# Patient Record
Sex: Female | Born: 1955 | Race: Black or African American | Hispanic: No | Marital: Single | Smoking: Never smoker
Health system: Southern US, Community
[De-identification: ages and names within clinical notes are randomized; demographics above are authoritative.]

## PROBLEM LIST (undated history)

## (undated) DIAGNOSIS — I10 Essential (primary) hypertension: Secondary | ICD-10-CM

## (undated) DIAGNOSIS — H269 Unspecified cataract: Secondary | ICD-10-CM

## (undated) DIAGNOSIS — E079 Disorder of thyroid, unspecified: Secondary | ICD-10-CM

## (undated) DIAGNOSIS — D352 Benign neoplasm of pituitary gland: Secondary | ICD-10-CM

## (undated) DIAGNOSIS — G4730 Sleep apnea, unspecified: Secondary | ICD-10-CM

## (undated) DIAGNOSIS — E119 Type 2 diabetes mellitus without complications: Secondary | ICD-10-CM

## (undated) DIAGNOSIS — E785 Hyperlipidemia, unspecified: Secondary | ICD-10-CM

## (undated) DIAGNOSIS — I509 Heart failure, unspecified: Secondary | ICD-10-CM

## (undated) HISTORY — DX: Disorder of thyroid, unspecified: E07.9

## (undated) HISTORY — DX: Sleep apnea, unspecified: G47.30

## (undated) HISTORY — PX: TONSILLECTOMY: SUR1361

## (undated) HISTORY — DX: Benign neoplasm of pituitary gland: D35.2

## (undated) HISTORY — PX: BREAST SURGERY: SHX581

## (undated) HISTORY — DX: Type 2 diabetes mellitus without complications: E11.9

## (undated) HISTORY — DX: Unspecified cataract: H26.9

## (undated) HISTORY — PX: BREAST EXCISIONAL BIOPSY: SUR124

## (undated) HISTORY — DX: Heart failure, unspecified: I50.9

## (undated) HISTORY — DX: Essential (primary) hypertension: I10

## (undated) HISTORY — PX: TUBAL LIGATION: SHX77

## (undated) HISTORY — DX: Hyperlipidemia, unspecified: E78.5

## (undated) HISTORY — PX: CHOLECYSTECTOMY: SHX55

---

## 2016-03-27 LAB — LAB REPORT - SCANNED: A1c: 11.4

## 2016-10-30 DIAGNOSIS — E669 Obesity, unspecified: Secondary | ICD-10-CM | POA: Insufficient documentation

## 2016-12-05 DIAGNOSIS — E113412 Type 2 diabetes mellitus with severe nonproliferative diabetic retinopathy with macular edema, left eye: Secondary | ICD-10-CM | POA: Diagnosis not present

## 2016-12-05 DIAGNOSIS — E113411 Type 2 diabetes mellitus with severe nonproliferative diabetic retinopathy with macular edema, right eye: Secondary | ICD-10-CM | POA: Diagnosis not present

## 2016-12-06 DIAGNOSIS — E1142 Type 2 diabetes mellitus with diabetic polyneuropathy: Secondary | ICD-10-CM | POA: Diagnosis not present

## 2016-12-08 DIAGNOSIS — E119 Type 2 diabetes mellitus without complications: Secondary | ICD-10-CM | POA: Diagnosis not present

## 2016-12-13 DIAGNOSIS — E039 Hypothyroidism, unspecified: Secondary | ICD-10-CM | POA: Diagnosis not present

## 2016-12-13 DIAGNOSIS — E669 Obesity, unspecified: Secondary | ICD-10-CM | POA: Diagnosis not present

## 2016-12-13 DIAGNOSIS — E139 Other specified diabetes mellitus without complications: Secondary | ICD-10-CM | POA: Diagnosis not present

## 2016-12-29 DIAGNOSIS — E119 Type 2 diabetes mellitus without complications: Secondary | ICD-10-CM | POA: Diagnosis not present

## 2016-12-29 DIAGNOSIS — I10 Essential (primary) hypertension: Secondary | ICD-10-CM | POA: Diagnosis not present

## 2016-12-29 DIAGNOSIS — H6120 Impacted cerumen, unspecified ear: Secondary | ICD-10-CM | POA: Diagnosis not present

## 2016-12-29 DIAGNOSIS — E039 Hypothyroidism, unspecified: Secondary | ICD-10-CM | POA: Diagnosis not present

## 2016-12-29 DIAGNOSIS — E785 Hyperlipidemia, unspecified: Secondary | ICD-10-CM | POA: Diagnosis not present

## 2016-12-29 LAB — LAB REPORT - SCANNED: A1c: 9.6

## 2017-01-10 DIAGNOSIS — E669 Obesity, unspecified: Secondary | ICD-10-CM | POA: Diagnosis not present

## 2017-01-10 DIAGNOSIS — I10 Essential (primary) hypertension: Secondary | ICD-10-CM | POA: Diagnosis not present

## 2017-01-10 DIAGNOSIS — E139 Other specified diabetes mellitus without complications: Secondary | ICD-10-CM | POA: Diagnosis not present

## 2017-01-10 DIAGNOSIS — E039 Hypothyroidism, unspecified: Secondary | ICD-10-CM | POA: Diagnosis not present

## 2017-01-12 DIAGNOSIS — E669 Obesity, unspecified: Secondary | ICD-10-CM | POA: Diagnosis not present

## 2017-01-12 DIAGNOSIS — E119 Type 2 diabetes mellitus without complications: Secondary | ICD-10-CM | POA: Diagnosis not present

## 2017-01-12 DIAGNOSIS — E039 Hypothyroidism, unspecified: Secondary | ICD-10-CM | POA: Diagnosis not present

## 2017-01-12 DIAGNOSIS — I10 Essential (primary) hypertension: Secondary | ICD-10-CM | POA: Diagnosis not present

## 2017-01-15 DIAGNOSIS — E113412 Type 2 diabetes mellitus with severe nonproliferative diabetic retinopathy with macular edema, left eye: Secondary | ICD-10-CM | POA: Diagnosis not present

## 2017-01-15 DIAGNOSIS — E113411 Type 2 diabetes mellitus with severe nonproliferative diabetic retinopathy with macular edema, right eye: Secondary | ICD-10-CM | POA: Diagnosis not present

## 2017-02-02 DIAGNOSIS — E039 Hypothyroidism, unspecified: Secondary | ICD-10-CM | POA: Diagnosis not present

## 2017-02-02 DIAGNOSIS — E119 Type 2 diabetes mellitus without complications: Secondary | ICD-10-CM | POA: Diagnosis not present

## 2017-02-02 DIAGNOSIS — I10 Essential (primary) hypertension: Secondary | ICD-10-CM | POA: Diagnosis not present

## 2017-02-02 DIAGNOSIS — E669 Obesity, unspecified: Secondary | ICD-10-CM | POA: Diagnosis not present

## 2017-02-10 DIAGNOSIS — E039 Hypothyroidism, unspecified: Secondary | ICD-10-CM | POA: Diagnosis not present

## 2017-02-10 DIAGNOSIS — E139 Other specified diabetes mellitus without complications: Secondary | ICD-10-CM | POA: Diagnosis not present

## 2017-02-26 DIAGNOSIS — E113412 Type 2 diabetes mellitus with severe nonproliferative diabetic retinopathy with macular edema, left eye: Secondary | ICD-10-CM | POA: Diagnosis not present

## 2017-02-26 DIAGNOSIS — H353221 Exudative age-related macular degeneration, left eye, with active choroidal neovascularization: Secondary | ICD-10-CM | POA: Diagnosis not present

## 2017-02-26 DIAGNOSIS — E113411 Type 2 diabetes mellitus with severe nonproliferative diabetic retinopathy with macular edema, right eye: Secondary | ICD-10-CM | POA: Diagnosis not present

## 2017-03-05 ENCOUNTER — Other Ambulatory Visit: Payer: Self-pay | Admitting: Primary Care

## 2017-03-05 DIAGNOSIS — Z1231 Encounter for screening mammogram for malignant neoplasm of breast: Secondary | ICD-10-CM

## 2017-03-09 DIAGNOSIS — E119 Type 2 diabetes mellitus without complications: Secondary | ICD-10-CM | POA: Diagnosis not present

## 2017-03-12 DIAGNOSIS — E039 Hypothyroidism, unspecified: Secondary | ICD-10-CM | POA: Diagnosis not present

## 2017-03-12 DIAGNOSIS — E118 Type 2 diabetes mellitus with unspecified complications: Secondary | ICD-10-CM | POA: Diagnosis not present

## 2017-03-17 DIAGNOSIS — Z6831 Body mass index (BMI) 31.0-31.9, adult: Secondary | ICD-10-CM | POA: Diagnosis not present

## 2017-03-17 DIAGNOSIS — H9111 Presbycusis, right ear: Secondary | ICD-10-CM | POA: Diagnosis not present

## 2017-03-17 DIAGNOSIS — Z7982 Long term (current) use of aspirin: Secondary | ICD-10-CM | POA: Diagnosis not present

## 2017-03-17 DIAGNOSIS — R42 Dizziness and giddiness: Secondary | ICD-10-CM | POA: Diagnosis not present

## 2017-03-17 DIAGNOSIS — K047 Periapical abscess without sinus: Secondary | ICD-10-CM | POA: Diagnosis not present

## 2017-03-17 DIAGNOSIS — E1142 Type 2 diabetes mellitus with diabetic polyneuropathy: Secondary | ICD-10-CM | POA: Diagnosis not present

## 2017-03-17 DIAGNOSIS — E785 Hyperlipidemia, unspecified: Secondary | ICD-10-CM | POA: Diagnosis not present

## 2017-03-17 DIAGNOSIS — G3184 Mild cognitive impairment, so stated: Secondary | ICD-10-CM | POA: Diagnosis not present

## 2017-03-17 DIAGNOSIS — Z79899 Other long term (current) drug therapy: Secondary | ICD-10-CM | POA: Diagnosis not present

## 2017-03-17 DIAGNOSIS — H9313 Tinnitus, bilateral: Secondary | ICD-10-CM | POA: Diagnosis not present

## 2017-03-17 DIAGNOSIS — Z794 Long term (current) use of insulin: Secondary | ICD-10-CM | POA: Diagnosis not present

## 2017-03-17 DIAGNOSIS — K08409 Partial loss of teeth, unspecified cause, unspecified class: Secondary | ICD-10-CM | POA: Diagnosis not present

## 2017-03-17 DIAGNOSIS — E039 Hypothyroidism, unspecified: Secondary | ICD-10-CM | POA: Diagnosis not present

## 2017-03-17 DIAGNOSIS — R3915 Urgency of urination: Secondary | ICD-10-CM | POA: Diagnosis not present

## 2017-03-17 DIAGNOSIS — Z0000 Encounter for general adult medical examination without abnormal findings: Secondary | ICD-10-CM | POA: Diagnosis not present

## 2017-03-17 DIAGNOSIS — I10 Essential (primary) hypertension: Secondary | ICD-10-CM | POA: Diagnosis not present

## 2017-03-27 ENCOUNTER — Ambulatory Visit: Payer: Self-pay

## 2017-04-10 DIAGNOSIS — H3562 Retinal hemorrhage, left eye: Secondary | ICD-10-CM | POA: Diagnosis not present

## 2017-04-10 DIAGNOSIS — E113511 Type 2 diabetes mellitus with proliferative diabetic retinopathy with macular edema, right eye: Secondary | ICD-10-CM | POA: Diagnosis not present

## 2017-04-10 DIAGNOSIS — H4311 Vitreous hemorrhage, right eye: Secondary | ICD-10-CM | POA: Diagnosis not present

## 2017-04-10 DIAGNOSIS — H353221 Exudative age-related macular degeneration, left eye, with active choroidal neovascularization: Secondary | ICD-10-CM | POA: Diagnosis not present

## 2017-04-10 DIAGNOSIS — E113411 Type 2 diabetes mellitus with severe nonproliferative diabetic retinopathy with macular edema, right eye: Secondary | ICD-10-CM | POA: Diagnosis not present

## 2017-04-10 DIAGNOSIS — E113412 Type 2 diabetes mellitus with severe nonproliferative diabetic retinopathy with macular edema, left eye: Secondary | ICD-10-CM | POA: Diagnosis not present

## 2017-04-12 DIAGNOSIS — E118 Type 2 diabetes mellitus with unspecified complications: Secondary | ICD-10-CM | POA: Diagnosis not present

## 2017-04-12 DIAGNOSIS — E039 Hypothyroidism, unspecified: Secondary | ICD-10-CM | POA: Diagnosis not present

## 2017-04-19 DIAGNOSIS — H3562 Retinal hemorrhage, left eye: Secondary | ICD-10-CM | POA: Diagnosis not present

## 2017-04-19 DIAGNOSIS — H4311 Vitreous hemorrhage, right eye: Secondary | ICD-10-CM | POA: Diagnosis not present

## 2017-04-19 DIAGNOSIS — E113511 Type 2 diabetes mellitus with proliferative diabetic retinopathy with macular edema, right eye: Secondary | ICD-10-CM | POA: Diagnosis not present

## 2017-04-19 DIAGNOSIS — E113412 Type 2 diabetes mellitus with severe nonproliferative diabetic retinopathy with macular edema, left eye: Secondary | ICD-10-CM | POA: Diagnosis not present

## 2017-05-02 DIAGNOSIS — E113412 Type 2 diabetes mellitus with severe nonproliferative diabetic retinopathy with macular edema, left eye: Secondary | ICD-10-CM | POA: Diagnosis not present

## 2017-05-04 DIAGNOSIS — E119 Type 2 diabetes mellitus without complications: Secondary | ICD-10-CM | POA: Diagnosis not present

## 2017-05-04 DIAGNOSIS — E785 Hyperlipidemia, unspecified: Secondary | ICD-10-CM | POA: Diagnosis not present

## 2017-05-04 DIAGNOSIS — E039 Hypothyroidism, unspecified: Secondary | ICD-10-CM | POA: Diagnosis not present

## 2017-05-04 DIAGNOSIS — I10 Essential (primary) hypertension: Secondary | ICD-10-CM | POA: Diagnosis not present

## 2017-05-04 DIAGNOSIS — H6120 Impacted cerumen, unspecified ear: Secondary | ICD-10-CM | POA: Diagnosis not present

## 2017-05-05 LAB — LAB REPORT - SCANNED
A1c: 8.1
EGFR: 59

## 2017-05-12 DIAGNOSIS — E039 Hypothyroidism, unspecified: Secondary | ICD-10-CM | POA: Diagnosis not present

## 2017-05-12 DIAGNOSIS — E118 Type 2 diabetes mellitus with unspecified complications: Secondary | ICD-10-CM | POA: Diagnosis not present

## 2017-06-08 DIAGNOSIS — E119 Type 2 diabetes mellitus without complications: Secondary | ICD-10-CM | POA: Diagnosis not present

## 2017-06-12 DIAGNOSIS — E039 Hypothyroidism, unspecified: Secondary | ICD-10-CM | POA: Diagnosis not present

## 2017-06-12 DIAGNOSIS — E118 Type 2 diabetes mellitus with unspecified complications: Secondary | ICD-10-CM | POA: Diagnosis not present

## 2017-06-27 DIAGNOSIS — E1142 Type 2 diabetes mellitus with diabetic polyneuropathy: Secondary | ICD-10-CM | POA: Diagnosis not present

## 2017-07-13 DIAGNOSIS — E118 Type 2 diabetes mellitus with unspecified complications: Secondary | ICD-10-CM | POA: Diagnosis not present

## 2017-07-13 DIAGNOSIS — E039 Hypothyroidism, unspecified: Secondary | ICD-10-CM | POA: Diagnosis not present

## 2017-08-12 DIAGNOSIS — E118 Type 2 diabetes mellitus with unspecified complications: Secondary | ICD-10-CM | POA: Diagnosis not present

## 2017-08-12 DIAGNOSIS — E039 Hypothyroidism, unspecified: Secondary | ICD-10-CM | POA: Diagnosis not present

## 2017-08-30 DIAGNOSIS — H3562 Retinal hemorrhage, left eye: Secondary | ICD-10-CM | POA: Diagnosis not present

## 2017-08-30 DIAGNOSIS — E113511 Type 2 diabetes mellitus with proliferative diabetic retinopathy with macular edema, right eye: Secondary | ICD-10-CM | POA: Diagnosis not present

## 2017-08-30 DIAGNOSIS — E113412 Type 2 diabetes mellitus with severe nonproliferative diabetic retinopathy with macular edema, left eye: Secondary | ICD-10-CM | POA: Diagnosis not present

## 2017-08-30 DIAGNOSIS — H3561 Retinal hemorrhage, right eye: Secondary | ICD-10-CM | POA: Diagnosis not present

## 2017-09-04 DIAGNOSIS — E113412 Type 2 diabetes mellitus with severe nonproliferative diabetic retinopathy with macular edema, left eye: Secondary | ICD-10-CM | POA: Diagnosis not present

## 2017-09-07 DIAGNOSIS — R69 Illness, unspecified: Secondary | ICD-10-CM | POA: Diagnosis not present

## 2017-09-07 DIAGNOSIS — E119 Type 2 diabetes mellitus without complications: Secondary | ICD-10-CM | POA: Diagnosis not present

## 2017-09-10 DIAGNOSIS — E113511 Type 2 diabetes mellitus with proliferative diabetic retinopathy with macular edema, right eye: Secondary | ICD-10-CM | POA: Diagnosis not present

## 2017-09-12 DIAGNOSIS — E039 Hypothyroidism, unspecified: Secondary | ICD-10-CM | POA: Diagnosis not present

## 2017-09-12 DIAGNOSIS — E118 Type 2 diabetes mellitus with unspecified complications: Secondary | ICD-10-CM | POA: Diagnosis not present

## 2017-09-12 DIAGNOSIS — E7800 Pure hypercholesterolemia, unspecified: Secondary | ICD-10-CM | POA: Diagnosis not present

## 2017-09-12 DIAGNOSIS — I10 Essential (primary) hypertension: Secondary | ICD-10-CM | POA: Diagnosis not present

## 2017-09-12 DIAGNOSIS — E669 Obesity, unspecified: Secondary | ICD-10-CM | POA: Diagnosis not present

## 2017-09-12 DIAGNOSIS — E119 Type 2 diabetes mellitus without complications: Secondary | ICD-10-CM | POA: Diagnosis not present

## 2017-09-13 LAB — LAB REPORT - SCANNED
A1c: 7
EGFR: 61

## 2017-10-06 DIAGNOSIS — R69 Illness, unspecified: Secondary | ICD-10-CM | POA: Diagnosis not present

## 2017-10-12 DIAGNOSIS — E118 Type 2 diabetes mellitus with unspecified complications: Secondary | ICD-10-CM | POA: Diagnosis not present

## 2017-10-12 DIAGNOSIS — E039 Hypothyroidism, unspecified: Secondary | ICD-10-CM | POA: Diagnosis not present

## 2017-11-07 DIAGNOSIS — R69 Illness, unspecified: Secondary | ICD-10-CM | POA: Diagnosis not present

## 2017-11-12 DIAGNOSIS — E669 Obesity, unspecified: Secondary | ICD-10-CM | POA: Diagnosis not present

## 2017-11-12 DIAGNOSIS — I10 Essential (primary) hypertension: Secondary | ICD-10-CM | POA: Diagnosis not present

## 2017-11-12 DIAGNOSIS — E039 Hypothyroidism, unspecified: Secondary | ICD-10-CM | POA: Diagnosis not present

## 2017-11-12 DIAGNOSIS — E118 Type 2 diabetes mellitus with unspecified complications: Secondary | ICD-10-CM | POA: Diagnosis not present

## 2017-12-04 DIAGNOSIS — E113511 Type 2 diabetes mellitus with proliferative diabetic retinopathy with macular edema, right eye: Secondary | ICD-10-CM | POA: Diagnosis not present

## 2017-12-04 DIAGNOSIS — H3562 Retinal hemorrhage, left eye: Secondary | ICD-10-CM | POA: Diagnosis not present

## 2017-12-04 DIAGNOSIS — H26491 Other secondary cataract, right eye: Secondary | ICD-10-CM | POA: Diagnosis not present

## 2017-12-04 DIAGNOSIS — H4311 Vitreous hemorrhage, right eye: Secondary | ICD-10-CM | POA: Diagnosis not present

## 2017-12-04 DIAGNOSIS — E113412 Type 2 diabetes mellitus with severe nonproliferative diabetic retinopathy with macular edema, left eye: Secondary | ICD-10-CM | POA: Diagnosis not present

## 2017-12-08 DIAGNOSIS — R69 Illness, unspecified: Secondary | ICD-10-CM | POA: Diagnosis not present

## 2017-12-13 DIAGNOSIS — E039 Hypothyroidism, unspecified: Secondary | ICD-10-CM | POA: Diagnosis not present

## 2017-12-13 DIAGNOSIS — E118 Type 2 diabetes mellitus with unspecified complications: Secondary | ICD-10-CM | POA: Diagnosis not present

## 2017-12-13 DIAGNOSIS — E669 Obesity, unspecified: Secondary | ICD-10-CM | POA: Diagnosis not present

## 2017-12-13 DIAGNOSIS — I10 Essential (primary) hypertension: Secondary | ICD-10-CM | POA: Diagnosis not present

## 2017-12-19 DIAGNOSIS — H26491 Other secondary cataract, right eye: Secondary | ICD-10-CM | POA: Diagnosis not present

## 2017-12-20 DIAGNOSIS — E1151 Type 2 diabetes mellitus with diabetic peripheral angiopathy without gangrene: Secondary | ICD-10-CM | POA: Diagnosis not present

## 2017-12-20 DIAGNOSIS — I739 Peripheral vascular disease, unspecified: Secondary | ICD-10-CM | POA: Diagnosis not present

## 2017-12-20 DIAGNOSIS — L603 Nail dystrophy: Secondary | ICD-10-CM | POA: Diagnosis not present

## 2017-12-21 DIAGNOSIS — E119 Type 2 diabetes mellitus without complications: Secondary | ICD-10-CM | POA: Diagnosis not present

## 2018-01-02 DIAGNOSIS — E113511 Type 2 diabetes mellitus with proliferative diabetic retinopathy with macular edema, right eye: Secondary | ICD-10-CM | POA: Diagnosis not present

## 2018-01-02 DIAGNOSIS — H35043 Retinal micro-aneurysms, unspecified, bilateral: Secondary | ICD-10-CM | POA: Diagnosis not present

## 2018-01-02 DIAGNOSIS — H4311 Vitreous hemorrhage, right eye: Secondary | ICD-10-CM | POA: Diagnosis not present

## 2018-01-02 DIAGNOSIS — E113512 Type 2 diabetes mellitus with proliferative diabetic retinopathy with macular edema, left eye: Secondary | ICD-10-CM | POA: Diagnosis not present

## 2018-01-10 DIAGNOSIS — E669 Obesity, unspecified: Secondary | ICD-10-CM | POA: Diagnosis not present

## 2018-01-10 DIAGNOSIS — I10 Essential (primary) hypertension: Secondary | ICD-10-CM | POA: Diagnosis not present

## 2018-01-10 DIAGNOSIS — E039 Hypothyroidism, unspecified: Secondary | ICD-10-CM | POA: Diagnosis not present

## 2018-01-10 DIAGNOSIS — R69 Illness, unspecified: Secondary | ICD-10-CM | POA: Diagnosis not present

## 2018-01-10 DIAGNOSIS — E118 Type 2 diabetes mellitus with unspecified complications: Secondary | ICD-10-CM | POA: Diagnosis not present

## 2018-01-22 ENCOUNTER — Encounter: Payer: Self-pay | Admitting: Radiology

## 2018-01-22 ENCOUNTER — Ambulatory Visit
Admission: RE | Admit: 2018-01-22 | Discharge: 2018-01-22 | Disposition: A | Discharge status: Home / Self Care | Payer: Medicare HMO | Source: Ambulatory Visit | Attending: Primary Care | Admitting: Primary Care

## 2018-01-22 DIAGNOSIS — Z1231 Encounter for screening mammogram for malignant neoplasm of breast: Secondary | ICD-10-CM

## 2018-02-08 DIAGNOSIS — R69 Illness, unspecified: Secondary | ICD-10-CM | POA: Diagnosis not present

## 2018-02-10 DIAGNOSIS — E039 Hypothyroidism, unspecified: Secondary | ICD-10-CM | POA: Diagnosis not present

## 2018-02-10 DIAGNOSIS — E118 Type 2 diabetes mellitus with unspecified complications: Secondary | ICD-10-CM | POA: Diagnosis not present

## 2018-03-08 DIAGNOSIS — I10 Essential (primary) hypertension: Secondary | ICD-10-CM | POA: Diagnosis not present

## 2018-03-08 DIAGNOSIS — H6120 Impacted cerumen, unspecified ear: Secondary | ICD-10-CM | POA: Diagnosis not present

## 2018-03-08 DIAGNOSIS — E785 Hyperlipidemia, unspecified: Secondary | ICD-10-CM | POA: Diagnosis not present

## 2018-03-08 DIAGNOSIS — E1165 Type 2 diabetes mellitus with hyperglycemia: Secondary | ICD-10-CM | POA: Diagnosis not present

## 2018-03-12 DIAGNOSIS — E118 Type 2 diabetes mellitus with unspecified complications: Secondary | ICD-10-CM | POA: Diagnosis not present

## 2018-03-12 DIAGNOSIS — I10 Essential (primary) hypertension: Secondary | ICD-10-CM | POA: Diagnosis not present

## 2018-03-12 DIAGNOSIS — E039 Hypothyroidism, unspecified: Secondary | ICD-10-CM | POA: Diagnosis not present

## 2018-03-12 DIAGNOSIS — E669 Obesity, unspecified: Secondary | ICD-10-CM | POA: Diagnosis not present

## 2018-04-03 DIAGNOSIS — H35043 Retinal micro-aneurysms, unspecified, bilateral: Secondary | ICD-10-CM | POA: Diagnosis not present

## 2018-04-03 DIAGNOSIS — H3562 Retinal hemorrhage, left eye: Secondary | ICD-10-CM | POA: Diagnosis not present

## 2018-04-03 DIAGNOSIS — E113511 Type 2 diabetes mellitus with proliferative diabetic retinopathy with macular edema, right eye: Secondary | ICD-10-CM | POA: Diagnosis not present

## 2018-04-03 DIAGNOSIS — E113512 Type 2 diabetes mellitus with proliferative diabetic retinopathy with macular edema, left eye: Secondary | ICD-10-CM | POA: Diagnosis not present

## 2018-04-12 DIAGNOSIS — E118 Type 2 diabetes mellitus with unspecified complications: Secondary | ICD-10-CM | POA: Diagnosis not present

## 2018-04-12 DIAGNOSIS — E039 Hypothyroidism, unspecified: Secondary | ICD-10-CM | POA: Diagnosis not present

## 2018-04-19 DIAGNOSIS — E119 Type 2 diabetes mellitus without complications: Secondary | ICD-10-CM | POA: Diagnosis not present

## 2018-05-03 DIAGNOSIS — E039 Hypothyroidism, unspecified: Secondary | ICD-10-CM | POA: Diagnosis not present

## 2018-05-03 DIAGNOSIS — E118 Type 2 diabetes mellitus with unspecified complications: Secondary | ICD-10-CM | POA: Diagnosis not present

## 2018-05-21 DIAGNOSIS — E1142 Type 2 diabetes mellitus with diabetic polyneuropathy: Secondary | ICD-10-CM | POA: Diagnosis not present

## 2018-06-21 DIAGNOSIS — E785 Hyperlipidemia, unspecified: Secondary | ICD-10-CM | POA: Diagnosis not present

## 2018-06-21 DIAGNOSIS — R69 Illness, unspecified: Secondary | ICD-10-CM | POA: Diagnosis not present

## 2018-06-21 DIAGNOSIS — E559 Vitamin D deficiency, unspecified: Secondary | ICD-10-CM | POA: Diagnosis not present

## 2018-06-21 DIAGNOSIS — E119 Type 2 diabetes mellitus without complications: Secondary | ICD-10-CM | POA: Diagnosis not present

## 2018-06-21 DIAGNOSIS — Z1211 Encounter for screening for malignant neoplasm of colon: Secondary | ICD-10-CM | POA: Diagnosis not present

## 2018-07-04 DIAGNOSIS — E113512 Type 2 diabetes mellitus with proliferative diabetic retinopathy with macular edema, left eye: Secondary | ICD-10-CM | POA: Diagnosis not present

## 2018-07-04 DIAGNOSIS — E113511 Type 2 diabetes mellitus with proliferative diabetic retinopathy with macular edema, right eye: Secondary | ICD-10-CM | POA: Diagnosis not present

## 2018-07-04 DIAGNOSIS — H35043 Retinal micro-aneurysms, unspecified, bilateral: Secondary | ICD-10-CM | POA: Diagnosis not present

## 2018-07-04 DIAGNOSIS — H3562 Retinal hemorrhage, left eye: Secondary | ICD-10-CM | POA: Diagnosis not present

## 2018-07-16 DIAGNOSIS — R69 Illness, unspecified: Secondary | ICD-10-CM | POA: Diagnosis not present

## 2018-07-16 DIAGNOSIS — I129 Hypertensive chronic kidney disease with stage 1 through stage 4 chronic kidney disease, or unspecified chronic kidney disease: Secondary | ICD-10-CM | POA: Diagnosis not present

## 2018-07-16 DIAGNOSIS — N179 Acute kidney failure, unspecified: Secondary | ICD-10-CM | POA: Diagnosis not present

## 2018-07-16 DIAGNOSIS — E1129 Type 2 diabetes mellitus with other diabetic kidney complication: Secondary | ICD-10-CM | POA: Diagnosis not present

## 2018-07-16 DIAGNOSIS — R8271 Bacteriuria: Secondary | ICD-10-CM | POA: Diagnosis not present

## 2018-07-19 ENCOUNTER — Other Ambulatory Visit: Payer: Self-pay | Admitting: Internal Medicine

## 2018-07-19 DIAGNOSIS — N179 Acute kidney failure, unspecified: Secondary | ICD-10-CM

## 2018-07-26 ENCOUNTER — Ambulatory Visit
Admission: RE | Admit: 2018-07-26 | Discharge: 2018-07-26 | Disposition: A | Discharge status: Home / Self Care | Payer: Medicare HMO | Source: Ambulatory Visit | Attending: Internal Medicine | Admitting: Internal Medicine

## 2018-07-26 DIAGNOSIS — N179 Acute kidney failure, unspecified: Secondary | ICD-10-CM

## 2018-08-19 DIAGNOSIS — E119 Type 2 diabetes mellitus without complications: Secondary | ICD-10-CM | POA: Diagnosis not present

## 2018-08-19 DIAGNOSIS — H6120 Impacted cerumen, unspecified ear: Secondary | ICD-10-CM | POA: Diagnosis not present

## 2018-08-19 DIAGNOSIS — N179 Acute kidney failure, unspecified: Secondary | ICD-10-CM | POA: Diagnosis not present

## 2018-08-19 DIAGNOSIS — N760 Acute vaginitis: Secondary | ICD-10-CM | POA: Diagnosis not present

## 2018-08-28 DIAGNOSIS — E559 Vitamin D deficiency, unspecified: Secondary | ICD-10-CM | POA: Diagnosis not present

## 2018-08-28 DIAGNOSIS — E1129 Type 2 diabetes mellitus with other diabetic kidney complication: Secondary | ICD-10-CM | POA: Diagnosis not present

## 2018-08-28 DIAGNOSIS — N183 Chronic kidney disease, stage 3 (moderate): Secondary | ICD-10-CM | POA: Diagnosis not present

## 2018-09-06 DIAGNOSIS — E119 Type 2 diabetes mellitus without complications: Secondary | ICD-10-CM | POA: Diagnosis not present

## 2018-09-20 DIAGNOSIS — E1151 Type 2 diabetes mellitus with diabetic peripheral angiopathy without gangrene: Secondary | ICD-10-CM | POA: Diagnosis not present

## 2018-09-20 DIAGNOSIS — L603 Nail dystrophy: Secondary | ICD-10-CM | POA: Diagnosis not present

## 2018-09-20 DIAGNOSIS — I739 Peripheral vascular disease, unspecified: Secondary | ICD-10-CM | POA: Diagnosis not present

## 2018-10-14 DIAGNOSIS — R69 Illness, unspecified: Secondary | ICD-10-CM | POA: Diagnosis not present

## 2018-10-17 DIAGNOSIS — E1129 Type 2 diabetes mellitus with other diabetic kidney complication: Secondary | ICD-10-CM | POA: Diagnosis not present

## 2018-10-17 DIAGNOSIS — N179 Acute kidney failure, unspecified: Secondary | ICD-10-CM | POA: Diagnosis not present

## 2018-10-17 DIAGNOSIS — I129 Hypertensive chronic kidney disease with stage 1 through stage 4 chronic kidney disease, or unspecified chronic kidney disease: Secondary | ICD-10-CM | POA: Diagnosis not present

## 2018-10-17 DIAGNOSIS — E785 Hyperlipidemia, unspecified: Secondary | ICD-10-CM | POA: Diagnosis not present

## 2018-10-31 DIAGNOSIS — H4311 Vitreous hemorrhage, right eye: Secondary | ICD-10-CM | POA: Diagnosis not present

## 2018-10-31 DIAGNOSIS — E113511 Type 2 diabetes mellitus with proliferative diabetic retinopathy with macular edema, right eye: Secondary | ICD-10-CM | POA: Diagnosis not present

## 2018-10-31 DIAGNOSIS — H3562 Retinal hemorrhage, left eye: Secondary | ICD-10-CM | POA: Diagnosis not present

## 2018-10-31 DIAGNOSIS — E113512 Type 2 diabetes mellitus with proliferative diabetic retinopathy with macular edema, left eye: Secondary | ICD-10-CM | POA: Diagnosis not present

## 2018-11-02 DIAGNOSIS — R69 Illness, unspecified: Secondary | ICD-10-CM | POA: Diagnosis not present

## 2018-11-20 DIAGNOSIS — E113511 Type 2 diabetes mellitus with proliferative diabetic retinopathy with macular edema, right eye: Secondary | ICD-10-CM | POA: Diagnosis not present

## 2018-11-20 DIAGNOSIS — E113591 Type 2 diabetes mellitus with proliferative diabetic retinopathy without macular edema, right eye: Secondary | ICD-10-CM | POA: Diagnosis not present

## 2018-11-20 DIAGNOSIS — H4311 Vitreous hemorrhage, right eye: Secondary | ICD-10-CM | POA: Diagnosis not present

## 2018-11-21 DIAGNOSIS — R69 Illness, unspecified: Secondary | ICD-10-CM | POA: Diagnosis not present

## 2018-11-28 DIAGNOSIS — E113512 Type 2 diabetes mellitus with proliferative diabetic retinopathy with macular edema, left eye: Secondary | ICD-10-CM | POA: Diagnosis not present

## 2018-11-28 DIAGNOSIS — Z09 Encounter for follow-up examination after completed treatment for conditions other than malignant neoplasm: Secondary | ICD-10-CM | POA: Diagnosis not present

## 2018-11-28 DIAGNOSIS — E113511 Type 2 diabetes mellitus with proliferative diabetic retinopathy with macular edema, right eye: Secondary | ICD-10-CM | POA: Diagnosis not present

## 2018-12-04 DIAGNOSIS — R69 Illness, unspecified: Secondary | ICD-10-CM | POA: Diagnosis not present

## 2018-12-30 ENCOUNTER — Other Ambulatory Visit: Payer: Self-pay | Admitting: Nurse Practitioner

## 2018-12-30 DIAGNOSIS — Z1231 Encounter for screening mammogram for malignant neoplasm of breast: Secondary | ICD-10-CM

## 2019-01-07 DIAGNOSIS — E113512 Type 2 diabetes mellitus with proliferative diabetic retinopathy with macular edema, left eye: Secondary | ICD-10-CM | POA: Diagnosis not present

## 2019-01-07 DIAGNOSIS — H359 Unspecified retinal disorder: Secondary | ICD-10-CM | POA: Diagnosis not present

## 2019-01-07 DIAGNOSIS — E113511 Type 2 diabetes mellitus with proliferative diabetic retinopathy with macular edema, right eye: Secondary | ICD-10-CM | POA: Diagnosis not present

## 2019-01-07 DIAGNOSIS — H3562 Retinal hemorrhage, left eye: Secondary | ICD-10-CM | POA: Diagnosis not present

## 2019-01-29 ENCOUNTER — Ambulatory Visit: Payer: Medicare HMO

## 2019-02-03 DIAGNOSIS — R69 Illness, unspecified: Secondary | ICD-10-CM | POA: Diagnosis not present

## 2019-02-07 DIAGNOSIS — E119 Type 2 diabetes mellitus without complications: Secondary | ICD-10-CM | POA: Diagnosis not present

## 2019-03-02 DIAGNOSIS — R69 Illness, unspecified: Secondary | ICD-10-CM | POA: Diagnosis not present

## 2019-04-01 DIAGNOSIS — E1142 Type 2 diabetes mellitus with diabetic polyneuropathy: Secondary | ICD-10-CM | POA: Diagnosis not present

## 2019-04-07 DIAGNOSIS — R69 Illness, unspecified: Secondary | ICD-10-CM | POA: Diagnosis not present

## 2019-04-10 ENCOUNTER — Ambulatory Visit: Payer: Medicare HMO

## 2019-04-21 DIAGNOSIS — E785 Hyperlipidemia, unspecified: Secondary | ICD-10-CM | POA: Diagnosis not present

## 2019-04-21 DIAGNOSIS — E7800 Pure hypercholesterolemia, unspecified: Secondary | ICD-10-CM | POA: Diagnosis not present

## 2019-04-21 DIAGNOSIS — I129 Hypertensive chronic kidney disease with stage 1 through stage 4 chronic kidney disease, or unspecified chronic kidney disease: Secondary | ICD-10-CM | POA: Diagnosis not present

## 2019-04-21 DIAGNOSIS — I10 Essential (primary) hypertension: Secondary | ICD-10-CM | POA: Diagnosis not present

## 2019-04-21 DIAGNOSIS — N183 Chronic kidney disease, stage 3 (moderate): Secondary | ICD-10-CM | POA: Diagnosis not present

## 2019-04-21 DIAGNOSIS — E039 Hypothyroidism, unspecified: Secondary | ICD-10-CM | POA: Diagnosis not present

## 2019-04-21 DIAGNOSIS — E1122 Type 2 diabetes mellitus with diabetic chronic kidney disease: Secondary | ICD-10-CM | POA: Diagnosis not present

## 2019-04-22 DIAGNOSIS — R69 Illness, unspecified: Secondary | ICD-10-CM | POA: Diagnosis not present

## 2019-04-29 DIAGNOSIS — H35043 Retinal micro-aneurysms, unspecified, bilateral: Secondary | ICD-10-CM | POA: Diagnosis not present

## 2019-04-29 DIAGNOSIS — E113551 Type 2 diabetes mellitus with stable proliferative diabetic retinopathy, right eye: Secondary | ICD-10-CM | POA: Diagnosis not present

## 2019-04-29 DIAGNOSIS — H3562 Retinal hemorrhage, left eye: Secondary | ICD-10-CM | POA: Diagnosis not present

## 2019-04-29 DIAGNOSIS — E113512 Type 2 diabetes mellitus with proliferative diabetic retinopathy with macular edema, left eye: Secondary | ICD-10-CM | POA: Diagnosis not present

## 2019-04-30 DIAGNOSIS — Z7189 Other specified counseling: Secondary | ICD-10-CM | POA: Diagnosis not present

## 2019-05-12 DIAGNOSIS — R69 Illness, unspecified: Secondary | ICD-10-CM | POA: Diagnosis not present

## 2019-05-21 ENCOUNTER — Other Ambulatory Visit: Payer: Self-pay

## 2019-05-21 ENCOUNTER — Ambulatory Visit
Admission: RE | Admit: 2019-05-21 | Discharge: 2019-05-21 | Disposition: A | Discharge status: Home / Self Care | Payer: Medicare HMO | Source: Ambulatory Visit | Attending: Nurse Practitioner | Admitting: Nurse Practitioner

## 2019-05-21 DIAGNOSIS — Z1231 Encounter for screening mammogram for malignant neoplasm of breast: Secondary | ICD-10-CM

## 2019-05-29 DIAGNOSIS — R69 Illness, unspecified: Secondary | ICD-10-CM | POA: Diagnosis not present

## 2019-06-23 DIAGNOSIS — D352 Benign neoplasm of pituitary gland: Secondary | ICD-10-CM | POA: Diagnosis not present

## 2019-06-23 DIAGNOSIS — N183 Chronic kidney disease, stage 3 (moderate): Secondary | ICD-10-CM | POA: Diagnosis not present

## 2019-06-23 DIAGNOSIS — E039 Hypothyroidism, unspecified: Secondary | ICD-10-CM | POA: Diagnosis not present

## 2019-06-23 DIAGNOSIS — Z1211 Encounter for screening for malignant neoplasm of colon: Secondary | ICD-10-CM | POA: Diagnosis not present

## 2019-06-23 DIAGNOSIS — I10 Essential (primary) hypertension: Secondary | ICD-10-CM | POA: Diagnosis not present

## 2019-06-23 DIAGNOSIS — E119 Type 2 diabetes mellitus without complications: Secondary | ICD-10-CM | POA: Diagnosis not present

## 2019-06-24 LAB — LAB REPORT - SCANNED
A1c: 7.1
EGFR: 51

## 2019-07-09 DIAGNOSIS — N183 Chronic kidney disease, stage 3 (moderate): Secondary | ICD-10-CM | POA: Diagnosis not present

## 2019-09-02 DIAGNOSIS — E113512 Type 2 diabetes mellitus with proliferative diabetic retinopathy with macular edema, left eye: Secondary | ICD-10-CM | POA: Diagnosis not present

## 2019-09-02 DIAGNOSIS — H3562 Retinal hemorrhage, left eye: Secondary | ICD-10-CM | POA: Diagnosis not present

## 2019-09-02 DIAGNOSIS — E113551 Type 2 diabetes mellitus with stable proliferative diabetic retinopathy, right eye: Secondary | ICD-10-CM | POA: Diagnosis not present

## 2019-09-02 DIAGNOSIS — E220 Acromegaly and pituitary gigantism: Secondary | ICD-10-CM | POA: Diagnosis not present

## 2019-09-22 DIAGNOSIS — N183 Chronic kidney disease, stage 3 unspecified: Secondary | ICD-10-CM | POA: Diagnosis not present

## 2019-09-22 DIAGNOSIS — E039 Hypothyroidism, unspecified: Secondary | ICD-10-CM | POA: Diagnosis not present

## 2019-09-22 DIAGNOSIS — I10 Essential (primary) hypertension: Secondary | ICD-10-CM | POA: Diagnosis not present

## 2019-09-22 DIAGNOSIS — E119 Type 2 diabetes mellitus without complications: Secondary | ICD-10-CM | POA: Diagnosis not present

## 2019-10-01 DIAGNOSIS — E1142 Type 2 diabetes mellitus with diabetic polyneuropathy: Secondary | ICD-10-CM | POA: Diagnosis not present

## 2019-10-06 ENCOUNTER — Other Ambulatory Visit: Payer: Self-pay

## 2019-10-06 ENCOUNTER — Encounter: Payer: Self-pay | Admitting: Endocrinology

## 2019-10-06 ENCOUNTER — Ambulatory Visit (INDEPENDENT_AMBULATORY_CARE_PROVIDER_SITE_OTHER): Payer: Medicare HMO | Admitting: Endocrinology

## 2019-10-06 DIAGNOSIS — E049 Nontoxic goiter, unspecified: Secondary | ICD-10-CM | POA: Diagnosis not present

## 2019-10-06 DIAGNOSIS — D352 Benign neoplasm of pituitary gland: Secondary | ICD-10-CM | POA: Diagnosis not present

## 2019-10-06 LAB — T4, FREE: Free T4: 1.14 ng/dL (ref 0.60–1.60)

## 2019-10-06 LAB — LUTEINIZING HORMONE: LH: 0.04 m[IU]/mL

## 2019-10-06 LAB — CORTISOL: Cortisol, Plasma: 9.4 ug/dL

## 2019-10-06 LAB — TSH: TSH: 0.04 u[IU]/mL — ABNORMAL LOW (ref 0.35–4.50)

## 2019-10-06 LAB — FOLLICLE STIMULATING HORMONE: FSH: 0.4 m[IU]/mL

## 2019-10-06 NOTE — Patient Instructions (Addendum)
Your blood pressure is high today.  Please see your primary care provider soon, to have it rechecked Let's check the ultrasound.   Blood tests are requested for you today.  We'll let you know about the results.  Let's recheck the MRI.  you will receive a phone call, about a day and time for an appointment.

## 2019-10-06 NOTE — Progress Notes (Signed)
Subjective:    Patient ID: Danielle Schroeder, female    DOB: 22-Dec-1955, 63 y.o.   MRN: [id number]  HPI Pt is referred by Dr Lavonia Drafts, for hypothyroidism.  Pt reports she developed slight galactorrhea from both breasts, but no assoc headache, in approx 1990.  She was she took some injections for this x a few mos, but she has been on no rx since then. She never had pituitary surgery.  hypothyroidism was dx'ed in 2010.  she has been on prescribed thyroid hormone therapy since then.  she has never taken kelp or any other type of non-prescribed thyroid product.  she has never had thyroid imaging.  she has never had surgery, or XRT to the neck or brain.  she has never been on amiodarone or lithium.   No past medical history on file.  No past surgical history on file.  Social History   Socioeconomic History  . Marital status: Single    Spouse name: Not on file  . Number of children: Not on file  . Years of education: Not on file  . Highest education level: Not on file  Occupational History  . Not on file  Social Needs  . Financial resource strain: Not on file  . Food insecurity    Worry: Not on file    Inability: Not on file  . Transportation needs    Medical: Not on file    Non-medical: Not on file  Tobacco Use  . Smoking status: Never Smoker  . Smokeless tobacco: Never Used  Substance and Sexual Activity  . Alcohol use: Never    Frequency: Never  . Drug use: Never  . Sexual activity: Not Currently  Lifestyle  . Physical activity    Days per week: Not on file    Minutes per session: Not on file  . Stress: Not on file  Relationships  . Social Herbalist on phone: Not on file    Gets together: Not on file    Attends religious service: Not on file    Active member of club or organization: Not on file    Attends meetings of clubs or organizations: Not on file    Relationship status: Not on file  . Intimate partner violence    Fear of current or ex partner: Not on  file    Emotionally abused: Not on file    Physically abused: Not on file    Forced sexual activity: Not on file  Other Topics Concern  . Not on file  Social History Narrative  . Not on file    Current Outpatient Medications on File Prior to Visit  Medication Sig Dispense Refill  . amLODipine (NORVASC) 10 MG tablet Take 10 mg by mouth daily.    Marland Kitchen aspirin EC 81 MG tablet Take 81 mg by mouth daily.    . carvedilol (COREG) 25 MG tablet Take 25 mg by mouth 2 (two) times daily with a meal.    . furosemide (LASIX) 20 MG tablet Take 20 mg by mouth daily.    Marland Kitchen glucose blood test strip 1 each by Other route 3 (three) times daily. Use as instructed    . Insulin Glargine (BASAGLAR KWIKPEN) 100 UNIT/ML SOPN Inject 20 Units into the skin daily.    . Insulin Pen Needle (PEN NEEDLES) 31G X 5 MM MISC 1 each by Does not apply route daily.    Marland Kitchen losartan (COZAAR) 100 MG tablet Take 100 mg by mouth  daily.    . metFORMIN (GLUCOPHAGE) 1000 MG tablet Take 1,000 mg by mouth 2 (two) times daily with a meal.    . simvastatin (ZOCOR) 20 MG tablet Take 20 mg by mouth daily.    . TRADJENTA 5 MG TABS tablet Take 5 mg by mouth daily.     No current facility-administered medications on file prior to visit.     No Known Allergies  Family History  Problem Relation Age of Onset  . Thyroid disease Neg Hx     BP (!) 142/88 (BP Location: Left Arm, Patient Position: Sitting, Cuff Size: Large)   Pulse 90   Ht 5' 7.64" (1.718 m)   Wt 219 lb 12.8 oz (99.7 kg)   SpO2 98%   BMI 33.78 kg/m   Review of Systems denies depression, hair loss, muscle cramps, sob, memory loss, constipation, blurry vision, cold intolerance, myalgias, dry skin, rhinorrhea, easy bruising, and syncope.  She reports weight gain.  She has slight numbness of the feet.      Objective:   Physical Exam VS: see vs page GEN: no distress HEAD: head: no deformity eyes: no periorbital swelling, no proptosis external nose and ears are normal  NECK: thyroid is slightly enlarged on the right, but the left is normal.   CHEST WALL: no deformity LUNGS: clear to auscultation CV: reg rate and rhythm, no murmur ABD: abdomen is soft, nontender.  no hepatosplenomegaly.  not distended.  Self-reducing ventral hernia MUSCULOSKELETAL: muscle bulk and strength are grossly normal.  no obvious joint swelling.  gait is normal and steady EXTEMITIES: no deformity.  2+ bilat leg edema PULSES: no carotid bruit NEURO:  cn 2-12 grossly intact.   readily moves all 4's.  sensation is intact to touch on all 4's SKIN:  Normal texture and temperature.  No rash or suspicious lesion is visible.   NODES:  None palpable at the neck PSYCH: alert, well-oriented.  Does not appear anxious nor depressed.  I have reviewed outside records, and summarized: Pt was noted to have low TSH, and referred here.  Other probs addressed were wellness, HTN, pituitary adenoma, and DM.  outside test results are reviewed: TSH=0.04     Assessment & Plan:  Pituitary adenoma, new to me Low TSH, prob unrelated to the above.  Hypothyroidism is prob primary, and she needs reduced synthroid Galactorrhea, uncertain etiology, by hx.  Uncertain if related to the pituitary adenoma HTN: is noted today  Patient Instructions  Your blood pressure is high today.  Please see your primary care provider soon, to have it rechecked Let's check the ultrasound.   Blood tests are requested for you today.  We'll let you know about the results.  Let's recheck the MRI.  you will receive a phone call, about a day and time for an appointment.

## 2019-10-08 MED ORDER — LEVOTHYROXINE SODIUM 75 MCG PO TABS: 75.0000 ug | ORAL_TABLET | Freq: Every day | ORAL | 3 refills | Status: DC

## 2019-10-11 LAB — ACTH: C206 ACTH: 21 pg/mL (ref 6–50)

## 2019-10-11 LAB — INSULIN-LIKE GROWTH FACTOR
IGF-I, LC/MS: 816 ng/mL — ABNORMAL HIGH (ref 41–279)
Z-Score (Female): 4.8 SD — ABNORMAL HIGH (ref ?–2.0)

## 2019-10-11 LAB — ALPHA SUBUNIT (FREE): Alpha Subunit (Free): 0.34 ng/mL

## 2019-10-11 LAB — PROLACTIN: Prolactin: 2.5 ng/mL

## 2019-10-15 ENCOUNTER — Telehealth: Payer: Self-pay

## 2019-10-15 ENCOUNTER — Other Ambulatory Visit: Payer: Self-pay

## 2019-10-15 MED ORDER — LEVOTHYROXINE SODIUM 75 MCG PO TABS: 75.0000 ug | ORAL_TABLET | Freq: Every day | ORAL | 3 refills | Status: DC

## 2019-10-15 NOTE — Telephone Encounter (Signed)
-----   Message from Renato Shin, MD sent at 10/08/2019  4:17 PM EST ----- please contact patient: We need to slightly decrease the levothyroxine. Please let us know which pharmacy, and we'll send the rx.   Please redo the blood test in 1 month.

## 2019-10-15 NOTE — Telephone Encounter (Signed)
Called pt to inform about lab results as well as new orders. Using closed-loop communication, pt verbalized complete acceptance and understanding of all information provided. No further questions nor concerns were voiced at this time. Appt has also been scheduled for repeat labs on 11/19/19.

## 2019-10-17 DIAGNOSIS — N183 Chronic kidney disease, stage 3 unspecified: Secondary | ICD-10-CM | POA: Diagnosis not present

## 2019-10-17 DIAGNOSIS — E039 Hypothyroidism, unspecified: Secondary | ICD-10-CM | POA: Diagnosis not present

## 2019-10-17 DIAGNOSIS — E119 Type 2 diabetes mellitus without complications: Secondary | ICD-10-CM | POA: Diagnosis not present

## 2019-10-17 DIAGNOSIS — I10 Essential (primary) hypertension: Secondary | ICD-10-CM | POA: Diagnosis not present

## 2019-10-20 ENCOUNTER — Ambulatory Visit
Admission: RE | Admit: 2019-10-20 | Discharge: 2019-10-20 | Disposition: A | Discharge status: Home / Self Care | Payer: Medicare HMO | Source: Ambulatory Visit | Attending: Endocrinology | Admitting: Endocrinology

## 2019-10-20 DIAGNOSIS — E049 Nontoxic goiter, unspecified: Secondary | ICD-10-CM

## 2019-10-20 DIAGNOSIS — E041 Nontoxic single thyroid nodule: Secondary | ICD-10-CM | POA: Diagnosis not present

## 2019-10-22 DIAGNOSIS — E119 Type 2 diabetes mellitus without complications: Secondary | ICD-10-CM | POA: Diagnosis not present

## 2019-10-22 DIAGNOSIS — I10 Essential (primary) hypertension: Secondary | ICD-10-CM | POA: Diagnosis not present

## 2019-10-22 DIAGNOSIS — N183 Chronic kidney disease, stage 3 unspecified: Secondary | ICD-10-CM | POA: Diagnosis not present

## 2019-10-22 DIAGNOSIS — E039 Hypothyroidism, unspecified: Secondary | ICD-10-CM | POA: Diagnosis not present

## 2019-10-23 LAB — ARGININE VASOPRESSIN HORMONE
ADH: <0.8 pg/mL (ref 0.0–4.7)
Osmolality Meas: 295 mOsmol/kg (ref 280–301)

## 2019-10-23 LAB — LAB REPORT - SCANNED: EGFR: 49

## 2019-10-24 ENCOUNTER — Telehealth: Payer: Self-pay

## 2019-10-24 NOTE — Telephone Encounter (Signed)
-----   Message from Renato Shin, MD sent at 10/23/2019  5:40 PM EST ----- please contact patient:  As we have several things here, please convert the Jan lab to a dr appt.  Thank you.

## 2019-10-24 NOTE — Telephone Encounter (Signed)
Routing this message to the front desk for scheduling purposes. 

## 2019-10-24 NOTE — Telephone Encounter (Signed)
Called pt and schedule her for office visit on 11/18/2018

## 2019-11-01 ENCOUNTER — Other Ambulatory Visit: Payer: Medicare HMO

## 2019-11-05 ENCOUNTER — Other Ambulatory Visit: Payer: Self-pay

## 2019-11-05 ENCOUNTER — Other Ambulatory Visit: Payer: Self-pay | Admitting: Endocrinology

## 2019-11-05 ENCOUNTER — Ambulatory Visit
Admission: RE | Admit: 2019-11-05 | Discharge: 2019-11-05 | Disposition: A | Discharge status: Home / Self Care | Payer: Medicare HMO | Source: Ambulatory Visit | Attending: Endocrinology | Admitting: Endocrinology

## 2019-11-05 DIAGNOSIS — D352 Benign neoplasm of pituitary gland: Secondary | ICD-10-CM

## 2019-11-05 DIAGNOSIS — Z8639 Personal history of other endocrine, nutritional and metabolic disease: Secondary | ICD-10-CM | POA: Diagnosis not present

## 2019-11-05 MED ORDER — GADOBENATE DIMEGLUMINE 529 MG/ML IV SOLN
10.0000 mL | Freq: Once | INTRAVENOUS | Status: AC | PRN
  Administered 2019-11-05: 10 mL via INTRAVENOUS

## 2019-11-06 ENCOUNTER — Telehealth: Payer: Self-pay

## 2019-11-06 NOTE — Telephone Encounter (Signed)
SECOND ATTEMPT:  Called pt and informed about imaging results and new orders. Verbalized acceptance and understanding. Scheduled for labs 11/10/19.

## 2019-11-06 NOTE — Telephone Encounter (Signed)
Imaging results reviewed by Dr. Loanne Drilling. Called pt to inform about lab results as well as new orders. Unable to LVM requesting returned call d/t no VM. Will continue efforts to reach patient.

## 2019-11-06 NOTE — Telephone Encounter (Signed)
-----   Message from Renato Shin, MD sent at 11/05/2019  5:05 PM EST ----- please contact patient: The pituitary tumor is bigger.   1. Please see a neurosurgery specialist.  you will receive a phone call, about a day and time for an appointment. 2.  We need another blood test.  No need to be fasting.

## 2019-11-10 ENCOUNTER — Other Ambulatory Visit: Payer: Self-pay

## 2019-11-10 ENCOUNTER — Other Ambulatory Visit: Payer: Self-pay | Admitting: Internal Medicine

## 2019-11-10 ENCOUNTER — Other Ambulatory Visit (INDEPENDENT_AMBULATORY_CARE_PROVIDER_SITE_OTHER): Payer: Medicare HMO

## 2019-11-10 DIAGNOSIS — D352 Benign neoplasm of pituitary gland: Secondary | ICD-10-CM | POA: Diagnosis not present

## 2019-11-10 DIAGNOSIS — E049 Nontoxic goiter, unspecified: Secondary | ICD-10-CM | POA: Diagnosis not present

## 2019-11-10 LAB — TSH: TSH: 0.28 u[IU]/mL — ABNORMAL LOW (ref 0.35–4.50)

## 2019-11-10 LAB — T4, FREE: Free T4: 0.99 ng/dL (ref 0.60–1.60)

## 2019-11-11 LAB — PROLACTIN W/DILUTION: PROLACTIN,UNDILUTED: 2.5 ng/mL (ref 2.0–30.0)

## 2019-11-12 DIAGNOSIS — Z6834 Body mass index (BMI) 34.0-34.9, adult: Secondary | ICD-10-CM | POA: Diagnosis not present

## 2019-11-12 DIAGNOSIS — I10 Essential (primary) hypertension: Secondary | ICD-10-CM | POA: Diagnosis not present

## 2019-11-12 DIAGNOSIS — D352 Benign neoplasm of pituitary gland: Secondary | ICD-10-CM | POA: Diagnosis not present

## 2019-11-18 ENCOUNTER — Other Ambulatory Visit: Payer: Self-pay

## 2019-11-18 ENCOUNTER — Other Ambulatory Visit: Payer: Self-pay | Admitting: Radiation Therapy

## 2019-11-19 ENCOUNTER — Ambulatory Visit (INDEPENDENT_AMBULATORY_CARE_PROVIDER_SITE_OTHER): Payer: Medicare HMO | Admitting: Endocrinology

## 2019-11-19 ENCOUNTER — Other Ambulatory Visit: Payer: Medicare HMO

## 2019-11-19 ENCOUNTER — Telehealth: Payer: Self-pay

## 2019-11-19 ENCOUNTER — Encounter: Payer: Self-pay | Admitting: Endocrinology

## 2019-11-19 DIAGNOSIS — D352 Benign neoplasm of pituitary gland: Secondary | ICD-10-CM | POA: Diagnosis not present

## 2019-11-19 DIAGNOSIS — E039 Hypothyroidism, unspecified: Secondary | ICD-10-CM | POA: Diagnosis not present

## 2019-11-19 LAB — T4, FREE: Free T4: 0.91 ng/dL (ref 0.60–1.60)

## 2019-11-19 LAB — TSH: TSH: 0.36 u[IU]/mL (ref 0.35–4.50)

## 2019-11-19 NOTE — Patient Instructions (Addendum)
We should plan to recheck the ultrasound in approx 1 year. Please continue to see the neurosurgeon.  I think the surgery is a good idea.   Blood tests are requested for you today.  We'll let you know about the results.  Please come back for a follow-up appointment in 1 month.

## 2019-11-19 NOTE — Telephone Encounter (Signed)
-----   Message from Renato Shin, MD sent at 11/19/2019  2:07 PM EST ----- please contact patient: Danielle Schroeder is normal--good.  Please continue the same medication.  I'll see you next time.

## 2019-11-19 NOTE — Progress Notes (Signed)
Subjective:    Patient ID: Danielle Schroeder, female    DOB: 02-14-1956, 64 y.o.   MRN: [id number]  HPI Pt returns for f/u of pituitary adenoma (dx'ed at uncertain time, but was prob approx 1990; she was she took some injections for this x a few mos, but she has been on no rx since then; she never had pituitary surgery).   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 Pituitary adenoma with extrasellar extension ([place])     No past surgical history on file.  Social History   Socioeconomic History  . Marital status: Single    Spouse name: Not on file  . Number of children: Not on file  . Years of education: Not on file  . Highest education level: Not on file  Occupational History  . Not on file  Tobacco Use  . Smoking status: Never Smoker  . Smokeless tobacco: Never Used  Substance and Sexual Activity  . Alcohol use: Never  . Drug use: Never  . Sexual activity: Not Currently  Other Topics Concern  . Not on file  Social History Narrative  . Not on file   Social Determinants of Health   Financial Resource Strain:   . Difficulty of Paying Living Expenses: Not on file  Food Insecurity:   . Worried About Charity fundraiser in the Last Year: Not on file  . Ran Out of Food in the Last Year: Not on file  Transportation Needs:   . Lack of Transportation (Medical): Not on file  . Lack of Transportation (Non-Medical): Not on file  Physical Activity:   . Days of Exercise per Week: Not on file  . Minutes of Exercise per Session: Not on file  Stress:   . Feeling of Stress : Not on file  Social Connections:   . Frequency of Communication with Friends and Family:  Not on file  . Frequency of Social Gatherings with Friends and Family: Not on file  . Attends Religious Services: Not on file  . Active Member of Clubs or Organizations: Not on file  . Attends Archivist Meetings: Not on file  . Marital Status: Not on file  Intimate Partner Violence:   . Fear of Current or Ex-Partner: Not on file  . Emotionally Abused: Not on file  . Physically Abused: Not on file  . Sexually Abused: Not on file    Current Outpatient Medications on File Prior to Visit  Medication Sig Dispense Refill  . amLODipine (NORVASC) 10 MG tablet Take 10 mg by mouth daily.    Marland Kitchen aspirin EC 81 MG tablet Take 81 mg by mouth daily.    . carvedilol (COREG) 25 MG tablet Take 25 mg by mouth 2 (two) times daily with a meal.    . furosemide (LASIX) 20 MG tablet Take 20 mg by mouth daily.    Marland Kitchen glucose blood test strip 1 each by Other route 3 (three) times daily. Use as instructed    . Insulin Glargine (BASAGLAR KWIKPEN) 100 UNIT/ML SOPN Inject 20 Units into the skin daily.    . Insulin Pen Needle (PEN NEEDLES) 31G X 5 MM MISC 1 each by Does  not apply route daily.    Marland Kitchen levothyroxine (SYNTHROID) 75 MCG tablet Take 1 tablet (75 mcg total) by mouth daily before breakfast. 30 tablet 3  . losartan (COZAAR) 100 MG tablet Take 100 mg by mouth daily.    . metFORMIN (GLUCOPHAGE) 1000 MG tablet Take 1,000 mg by mouth 2 (two) times daily with a meal.    . simvastatin (ZOCOR) 20 MG tablet Take 20 mg by mouth daily.    . TRADJENTA 5 MG TABS tablet Take 5 mg by mouth daily.     No current facility-administered medications on file prior to visit.    No Known Allergies  Family History  Problem Relation Age of Onset  . Thyroid disease Neg Hx     BP 124/80 (BP Location: Left Arm, Patient Position: Sitting, Cuff Size: Large)   Pulse 92   Ht 5\' 7"  (1.702 m)   Wt 215 lb 3.2 oz (97.6 kg)   SpO2 97%   BMI 33.71 kg/m    Review of Systems She has fatigue    Objective:   Physical  Exam VITAL SIGNS:  See vs page GENERAL: no distress NECK: There is no palpable thyroid enlargement.  No thyroid nodule is palpable.  No palpable lymphadenopathy at the anterior neck.      Assessment & Plan:  MNG: we discussed  Hypothyroidism, prob secondary.  Given probable upcoming surgery, we should err in the side of undertreatment with synthroid.   Pituitary adenoma: I advised surgery.    Patient Instructions  We should plan to recheck the ultrasound in approx 1 year. Please continue to see the neurosurgeon.  I think the surgery is a good idea.   Blood tests are requested for you today.  We'll let you know about the results.  Please come back for a follow-up appointment in 1 month.

## 2019-11-19 NOTE — Telephone Encounter (Signed)
Lab results reviewed by Dr. Ellison. Letter has been mailed. For future reference, letter can be found in Epic. 

## 2019-11-22 ENCOUNTER — Encounter: Payer: Self-pay | Admitting: Endocrinology

## 2019-11-22 DIAGNOSIS — D352 Benign neoplasm of pituitary gland: Secondary | ICD-10-CM | POA: Insufficient documentation

## 2019-12-08 DIAGNOSIS — I10 Essential (primary) hypertension: Secondary | ICD-10-CM | POA: Diagnosis not present

## 2019-12-08 DIAGNOSIS — E039 Hypothyroidism, unspecified: Secondary | ICD-10-CM | POA: Diagnosis not present

## 2019-12-08 DIAGNOSIS — N183 Chronic kidney disease, stage 3 unspecified: Secondary | ICD-10-CM | POA: Diagnosis not present

## 2019-12-08 DIAGNOSIS — D352 Benign neoplasm of pituitary gland: Secondary | ICD-10-CM | POA: Diagnosis not present

## 2019-12-24 ENCOUNTER — Encounter: Payer: Self-pay | Admitting: Endocrinology

## 2019-12-24 ENCOUNTER — Other Ambulatory Visit: Payer: Self-pay

## 2019-12-24 ENCOUNTER — Ambulatory Visit (INDEPENDENT_AMBULATORY_CARE_PROVIDER_SITE_OTHER): Payer: Medicare HMO | Admitting: Endocrinology

## 2019-12-24 VITALS — BP 110/70 | HR 91 | Ht 67.0 in | Wt 212.8 lb

## 2019-12-24 DIAGNOSIS — D352 Benign neoplasm of pituitary gland: Secondary | ICD-10-CM

## 2019-12-24 DIAGNOSIS — I509 Heart failure, unspecified: Secondary | ICD-10-CM | POA: Diagnosis not present

## 2019-12-24 DIAGNOSIS — E220 Acromegaly and pituitary gigantism: Secondary | ICD-10-CM | POA: Diagnosis not present

## 2019-12-24 NOTE — Patient Instructions (Addendum)
Please go back to see the neurosurgery and cardiology specialists.  you will receive a phone call, about days and times for appointments. Please come back for a follow-up appointment in 3 months.

## 2019-12-24 NOTE — Progress Notes (Signed)
Subjective:    Patient ID: Danielle Schroeder, female    DOB: Jun 03, 1956, 64 y.o.   MRN: [id number]  HPI Pt returns for f/u of GH-producing pituitary adenoma (dx'ed at uncertain time, but was prob approx 1990; she was she took some injections for this x a few mos, but she has been on no rx since then; she never had pituitary surgery).   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   pt states she feels well in general, except for mild doe. Past Medical History:  Diagnosis Date  . 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  Social History   Socioeconomic History  . Marital status: Single    Spouse name: Not on file  . Number of children: Not on file  . Years of education: Not on file  . Highest education level: Not on file  Occupational History  . Not on file  Tobacco Use  . Smoking status: Never Smoker  . Smokeless tobacco: Never Used  Substance and Sexual Activity  . Alcohol use: Never  . Drug use: Never  . Sexual activity: Not Currently  Other Topics Concern  . Not on file  Social History Narrative  . Not on file   Social Determinants of Health   Financial Resource Strain:   . Difficulty of Paying Living Expenses: Not on file  Food Insecurity:   . Worried About Charity fundraiser in the Last Year: Not on file  . Ran Out of Food in the Last Year: Not on file  Transportation Needs:   . Lack of Transportation (Medical): Not on file  . Lack of Transportation (Non-Medical): Not on file  Physical Activity:   . Days of Exercise per Week: Not on file  . Minutes of Exercise per Session: Not on file  Stress:   .  Feeling of Stress : Not on file  Social Connections:   . Frequency of Communication with Friends and Family: Not on file  . Frequency of Social Gatherings with Friends and Family: Not on file  . Attends Religious Services: Not on file  . Active Member of Clubs or Organizations: Not on file  . Attends Archivist Meetings: Not on file  . Marital Status: Not on file  Intimate Partner Violence:   . Fear of Current or Ex-Partner: Not on file  . Emotionally Abused: Not on file  . Physically Abused: Not on file  . Sexually Abused: Not on file    Current Outpatient Medications on File Prior to Visit  Medication Sig Dispense Refill  . amLODipine (NORVASC) 10 MG tablet Take 10 mg by mouth daily.    Marland Kitchen aspirin EC 81 MG tablet Take 81 mg by mouth daily.    . carvedilol (COREG) 25 MG tablet Take 25 mg by mouth 2 (two) times daily with a meal.    . furosemide (LASIX) 20 MG tablet Take 20 mg by mouth daily.    Marland Kitchen glucose blood test strip 1 each by Other route 3 (three) times daily. Use as instructed    . Insulin Glargine (BASAGLAR KWIKPEN) 100 UNIT/ML SOPN Inject 20  Units into the skin daily.    . Insulin Pen Needle (PEN NEEDLES) 31G X 5 MM MISC 1 each by Does not apply route daily.    Marland Kitchen levothyroxine (SYNTHROID) 75 MCG tablet Take 1 tablet (75 mcg total) by mouth daily before breakfast. 30 tablet 3  . losartan (COZAAR) 100 MG tablet Take 100 mg by mouth daily.    . metFORMIN (GLUCOPHAGE) 1000 MG tablet Take 1,000 mg by mouth 2 (two) times daily with a meal.    . simvastatin (ZOCOR) 20 MG tablet Take 20 mg by mouth daily.    . TRADJENTA 5 MG TABS tablet Take 5 mg by mouth daily.     No current facility-administered medications on file prior to visit.    No Known Allergies  Family History  Problem Relation Age of Onset  . Thyroid disease Neg Hx     BP 110/70 (BP Location: Left Arm, Patient Position: Sitting, Cuff Size: Large)   Pulse 91   Ht 5\' 7"  (1.702 m)   Wt 212 lb 12.8 oz  (96.5 kg)   SpO2 91%   BMI 33.33 kg/m   Review of Systems Denies headache.     Objective:   Physical Exam VITAL SIGNS:  See vs page GENERAL: no distress MSK: acromegalic features of the hands are again noted.        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  She should consider surgery CHF, new to me.  She has minor sxs now.  However, in view of the above, she should see cardiol  Patient Instructions  Please go back to see the neurosurgery and cardiology specialists.  you will receive a phone call, about days and times for appointments. Please come back for a follow-up appointment in 3 months.

## 2020-01-09 DIAGNOSIS — M47816 Spondylosis without myelopathy or radiculopathy, lumbar region: Secondary | ICD-10-CM | POA: Diagnosis not present

## 2020-01-09 DIAGNOSIS — M545 Low back pain: Secondary | ICD-10-CM | POA: Diagnosis not present

## 2020-01-12 DIAGNOSIS — N183 Chronic kidney disease, stage 3 unspecified: Secondary | ICD-10-CM | POA: Diagnosis not present

## 2020-01-12 DIAGNOSIS — E119 Type 2 diabetes mellitus without complications: Secondary | ICD-10-CM | POA: Diagnosis not present

## 2020-01-12 DIAGNOSIS — D649 Anemia, unspecified: Secondary | ICD-10-CM | POA: Diagnosis not present

## 2020-01-12 DIAGNOSIS — E220 Acromegaly and pituitary gigantism: Secondary | ICD-10-CM | POA: Diagnosis not present

## 2020-01-12 DIAGNOSIS — I10 Essential (primary) hypertension: Secondary | ICD-10-CM | POA: Diagnosis not present

## 2020-01-13 LAB — LAB REPORT - SCANNED: EGFR: 43

## 2020-01-14 ENCOUNTER — Other Ambulatory Visit: Payer: Self-pay

## 2020-01-14 ENCOUNTER — Encounter: Payer: Self-pay | Admitting: Cardiology

## 2020-01-14 ENCOUNTER — Ambulatory Visit: Payer: Medicare HMO | Admitting: Cardiology

## 2020-01-14 VITALS — BP 150/82 | HR 86 | Ht 67.0 in | Wt 213.0 lb

## 2020-01-14 DIAGNOSIS — D352 Benign neoplasm of pituitary gland: Secondary | ICD-10-CM | POA: Diagnosis not present

## 2020-01-14 DIAGNOSIS — I5033 Acute on chronic diastolic (congestive) heart failure: Secondary | ICD-10-CM | POA: Diagnosis not present

## 2020-01-14 DIAGNOSIS — E220 Acromegaly and pituitary gigantism: Secondary | ICD-10-CM | POA: Diagnosis not present

## 2020-01-14 DIAGNOSIS — Z79899 Other long term (current) drug therapy: Secondary | ICD-10-CM | POA: Diagnosis not present

## 2020-01-14 MED ORDER — FUROSEMIDE 40 MG PO TABS: 40.0000 mg | ORAL_TABLET | Freq: Two times a day (BID) | ORAL | 1 refills | Status: DC

## 2020-01-14 NOTE — Patient Instructions (Signed)
Medication Instructions:  Please increase your Furosemide to 40 mg one tablet twice a day. Continue all other medications as listed.  *If you need a refill on your cardiac medications before your next appointment, please call your pharmacy*   Lab Work: Please have blood work today (CBC, BMP, Lipid)  If you have labs (blood work) drawn today and your tests are completely normal, you will receive your results only by: Marland Kitchen MyChart Message (if you have MyChart) OR . A paper copy in the mail If you have any lab test that is abnormal or we need to change your treatment, we will call you to review the results.  Testing/Procedures: Your physician has requested that you have an echocardiogram. Echocardiography is a painless test that uses sound waves to create images of your heart. It provides your doctor with information about the size and shape of your heart and how well your heart's chambers and valves are working. This procedure takes approximately one hour. There are no restrictions for this procedure.  Follow-Up: At [facility], you and your health needs are our priority.  As part of our continuing mission to provide you with exceptional heart care, we have created designated Provider Care Teams.  These Care Teams include your primary Cardiologist (physician) and Advanced Practice Providers (APPs -  Physician Assistants and Nurse Practitioners) who all work together to provide you with the care you need, when you need it.  We recommend signing up for the patient portal called "MyChart".  Sign up information is provided on this After Visit Summary.  MyChart is used to connect with patients for Virtual Visits (Telemedicine).  Patients are able to view lab/test results, encounter notes, upcoming appointments, etc.  Non-urgent messages can be sent to your provider as well.   To learn more about what you can do with MyChart, go to [email or web address].    Your next appointment:   1  week(s)  The format for your next appointment:   In Person  Provider:   You may see one of the following Advanced Practice Providers on your designated Care Team:    Truitt Merle, NP  Cecilie Kicks, NP  Kathyrn Drown, NP    Thank you for choosing [facility]!!

## 2020-01-14 NOTE — Progress Notes (Signed)
Cardiology Office Note:    Date:  01/14/2020   ID:  Danielle Schroeder, DOB [birth year], MRN [id number]  PCP:  Vassie Moment, MD  Cardiologist:  No primary care provider on file.  Electrophysiologist:  None   Referring MD: Renato Shin, MD     History of Present Illness:    Danielle Schroeder is a 64 y.o. female here for the evaluation of CHF at the request of Dr. Loanne Drilling.  She has a growth hormone producing pituitary adenoma diagnosed approximately 3.  Has never had pituitary surgery.  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  Prolactin was normal.  ACTH and cortisol normal.  8 years ago was told had CHF. Does not remember ECHO.  Does not remember being told that her heart was weakened.  She has been having worsening edema over the past 6 months, 4+ edema.  Mild dyspnea on exertion.  Fairly minor symptoms.  Requested to see cardiology.  NYHA 2 symptoms. No CP. No syncope.  No bleeding.   Past Medical History:  Diagnosis Date  . Pituitary adenoma with extrasellar extension ([place])     History reviewed. No pertinent surgical history.  Current Medications: Current Meds  Medication Sig  . amLODipine (NORVASC) 10 MG tablet Take 10 mg by mouth daily.  Marland Kitchen aspirin EC 81 MG tablet Take 81 mg by mouth daily.  . carvedilol (COREG) 25 MG tablet Take 25 mg by mouth 2 (two) times daily with a meal.  . glucose blood test strip 1 each by Other route 3 (three) times daily. Use as instructed  . Insulin Glargine (BASAGLAR KWIKPEN) 100 UNIT/ML SOPN Inject 20 Units into the skin daily.  . Insulin Pen Needle (PEN NEEDLES) 31G X 5 MM MISC 1 each by Does not apply route daily.  Marland Kitchen levothyroxine (SYNTHROID) 75 MCG tablet Take 1 tablet (75 mcg total) by mouth daily before breakfast.  . losartan (COZAAR) 100 MG tablet Take 100 mg by mouth daily.  . metFORMIN (GLUCOPHAGE) 1000 MG tablet Take 1,000 mg by mouth 2 (two) times daily with a meal.  . simvastatin (ZOCOR) 20 MG tablet Take 20 mg by  mouth daily.  . TRADJENTA 5 MG TABS tablet Take 5 mg by mouth daily.  . [DISCONTINUED] furosemide (LASIX) 20 MG tablet Take 20 mg by mouth daily.     Allergies:   Patient has no known allergies.   Social History   Socioeconomic History  . Marital status: Single    Spouse name: Not on file  . Number of children: Not on file  . Years of education: Not on file  . Highest education level: Not on file  Occupational History  . Not on file  Tobacco Use  . Smoking status: Never Smoker  . Smokeless tobacco: Never Used  Substance and Sexual Activity  . Alcohol use: Never  . Drug use: Never  . Sexual activity: Not Currently  Other Topics Concern  . Not on file  Social History Narrative  . Not on file   Social Determinants of Health   Financial Resource Strain:   . Difficulty of Paying Living Expenses: Not on file  Food Insecurity:   . Worried About Charity fundraiser in the Last Year: Not on file  . Ran Out of Food in the Last Year: Not on file  Transportation Needs:   . Lack of Transportation (Medical): Not on file  . Lack of Transportation (Non-Medical): Not on file  Physical Activity:   . Days of  Exercise per Week: Not on file  . Minutes of Exercise per Session: Not on file  Stress:   . Feeling of Stress : Not on file  Social Connections:   . Frequency of Communication with Friends and Family: Not on file  . Frequency of Social Gatherings with Friends and Family: Not on file  . Attends Religious Services: Not on file  . Active Member of Clubs or Organizations: Not on file  . Attends Archivist Meetings: Not on file  . Marital Status: Not on file     Family History: The patient's family history is negative for Thyroid disease.  No early heart disease.  Her father did have a stroke.  ROS:   Please see the history of present illness.    No fevers chills nausea vomiting syncope bleeding all other systems reviewed and are negative.  EKGs/Labs/Other Studies  Reviewed:    The following studies were reviewed today: Dr. Cordelia Pen office note, lab work reviewed.  EKG:  EKG is  ordered today.  The ekg ordered today demonstrates sinus rhythm 86 with nonspecific ST changes.  Recent Labs: 11/19/2019: TSH 0.36  Recent Lipid Panel No results found for: CHOL, TRIG, HDL, CHOLHDL, VLDL, LDLCALC, LDLDIRECT  Physical Exam:    VS:  BP (!) 150/82   Pulse 86   Ht 5\' 7"  (1.702 m)   Wt 213 lb (96.6 kg)   SpO2 97%   BMI 33.36 kg/m     Wt Readings from Last 3 Encounters:  01/14/20 213 lb (96.6 kg)  12/24/19 212 lb 12.8 oz (96.5 kg)  11/19/19 215 lb 3.2 oz (97.6 kg)     GEN:  Well nourished, well developed in no acute distress HEENT: Frontal bossing noted NECK: No JVD; No carotid bruits LYMPHATICS: No lymphadenopathy CARDIAC: RRR, no murmurs, rubs, gallops RESPIRATORY:  Clear to auscultation without rales, wheezing or rhonchi  ABDOMEN: Soft, non-tender, non-distended MUSCULOSKELETAL: 4+ bilateral lower extremity edema; No deformity, acromegaly SKIN: Warm and dry NEUROLOGIC:  Alert and oriented x 3 PSYCHIATRIC:  Normal affect   ASSESSMENT:    1. Acute on chronic diastolic congestive heart failure ([place])   2. 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 Pituitary adenoma ([place])    PLAN:    In order of problems listed above:  Chronic diastolic heart failure -NYHA class II-like symptoms however severe lower extremity edema -We will go ahead and increase her Lasix to 40 mg p.o. twice daily.  She is only taking 20 once a day.  Her legs have an excessive amount of fluid, 4+.  Low-sodium. -Continue with carvedilol.  Amlodipine may be making her lower extremity edema worse.  For now however we will continue as we are utilizing more Lasix. -I will check a complete metabolic profile, CBC, lipid panel. -Check echocardiogram to evaluate function.  Mixed hyperlipidemia -On simvastatin-check lipid panel  Essential hypertension -Multidrug regimen  reviewed.  Diuresing.  Pituitary adenoma/acromegaly -Waiting to hear back from further appointments about treatment plan.  We will get her in next week to see how she is doing on more aggressive diuresis.   Medication Adjustments/Labs and Tests Ordered: Current medicines are reviewed at length with the patient today.  Concerns regarding medicines are outlined above.  Orders Placed This Encounter  Procedures  . CBC  . Comprehensive metabolic panel  . Lipid panel  . EKG 12-Lead  . ECHOCARDIOGRAM COMPLETE   Meds ordered this encounter  Medications  . furosemide (LASIX) 40 MG tablet  Sig: Take 1 tablet (40 mg total) by mouth 2 (two) times daily.    Dispense:  180 tablet    Refill:  1    Patient Instructions  Medication Instructions:  Please increase your Furosemide to 40 mg one tablet twice a day. Continue all other medications as listed.  *If you need a refill on your cardiac medications before your next appointment, please call your pharmacy*   Lab Work: Please have blood work today (CBC, BMP, Lipid)  If you have labs (blood work) drawn today and your tests are completely normal, you will receive your results only by: Marland Kitchen MyChart Message (if you have MyChart) OR . A paper copy in the mail If you have any lab test that is abnormal or we need to change your treatment, we will call you to review the results.  Testing/Procedures: Your physician has requested that you have an echocardiogram. Echocardiography is a painless test that uses sound waves to create images of your heart. It provides your doctor with information about the size and shape of your heart and how well your heart's chambers and valves are working. This procedure takes approximately one hour. There are no restrictions for this procedure.  Follow-Up: At [facility], you and your health needs are our priority.  As part of our continuing mission to provide you with exceptional heart care, we have created  designated Provider Care Teams.  These Care Teams include your primary Cardiologist (physician) and Advanced Practice Providers (APPs -  Physician Assistants and Nurse Practitioners) who all work together to provide you with the care you need, when you need it.  We recommend signing up for the patient portal called "MyChart".  Sign up information is provided on this After Visit Summary.  MyChart is used to connect with patients for Virtual Visits (Telemedicine).  Patients are able to view lab/test results, encounter notes, upcoming appointments, etc.  Non-urgent messages can be sent to your provider as well.   To learn more about what you can do with MyChart, go to [email or web address].    Your next appointment:   1 week(s)  The format for your next appointment:   In Person  Provider:   You may see one of the following Advanced Practice Providers on your designated Care Team:    Truitt Merle, NP  Cecilie Kicks, NP  Kathyrn Drown, NP    Thank you for choosing [facility]!!        Signed, Candee Furbish, MD  01/14/2020 3:08 PM    [facility]

## 2020-01-15 LAB — COMPREHENSIVE METABOLIC PANEL
ALT: 20 IU/L (ref 0–32)
AST: 20 IU/L (ref 0–40)
Albumin/Globulin Ratio: 1.3 (ref 1.2–2.2)
Albumin: 4 g/dL (ref 3.8–4.8)
Alkaline Phosphatase: 132 IU/L — ABNORMAL HIGH (ref 39–117)
BUN/Creatinine Ratio: 14 (ref 12–28)
BUN: 17 mg/dL (ref 8–27)
Bilirubin Total: 0.3 mg/dL (ref 0.0–1.2)
CO2: 25 mmol/L (ref 20–29)
Calcium: 10.3 mg/dL (ref 8.7–10.3)
Chloride: 101 mmol/L (ref 96–106)
Creatinine, Ser: 1.23 mg/dL — ABNORMAL HIGH (ref 0.57–1.00)
GFR calc Af Amer: 54 mL/min/{1.73_m2} — ABNORMAL LOW (ref >59)
GFR calc non Af Amer: 47 mL/min/{1.73_m2} — ABNORMAL LOW (ref >59)
Globulin, Total: 3 g/dL (ref 1.5–4.5)
Glucose: 104 mg/dL — ABNORMAL HIGH (ref 65–99)
Potassium: 4.4 mmol/L (ref 3.5–5.2)
Sodium: 141 mmol/L (ref 134–144)
Total Protein: 7 g/dL (ref 6.0–8.5)

## 2020-01-15 LAB — LIPID PANEL
Chol/HDL Ratio: 1.6 ratio (ref 0.0–4.4)
Cholesterol, Total: 135 mg/dL (ref 100–199)
HDL: 82 mg/dL (ref >39)
LDL Chol Calc (NIH): 37 mg/dL (ref 0–99)
Triglycerides: 81 mg/dL (ref 0–149)
VLDL Cholesterol Cal: 16 mg/dL (ref 5–40)

## 2020-01-15 LAB — CBC
Hematocrit: 32.4 % — ABNORMAL LOW (ref 34.0–46.6)
Hemoglobin: 10.2 g/dL — ABNORMAL LOW (ref 11.1–15.9)
MCH: 27.9 pg (ref 26.6–33.0)
MCHC: 31.5 g/dL (ref 31.5–35.7)
MCV: 89 fL (ref 79–97)
Platelets: 214 10*3/uL (ref 150–450)
RBC: 3.66 x10E6/uL — ABNORMAL LOW (ref 3.77–5.28)
RDW: 13.6 % (ref 11.7–15.4)
WBC: 5.2 10*3/uL (ref 3.4–10.8)

## 2020-01-17 NOTE — Progress Notes (Signed)
Cardiology Office Note   Date:  01/21/2020   ID:  Danielle Schroeder, DOB [birth year], MRN [id number]  PCP:  Danielle Moment, MD  Cardiologist: Dr. Marlou Porch, MD  Chief Complaint  Patient presents with  . Follow-up      History of Present Illness: Danielle Schroeder is a 64 y.o. female who presents for follow-up, seen for Dr. Marlou Schroeder.  Danielle Schroeder has a history of growth hormone producing pituitary adenoma diagnosed approximately 1990 without surgical intervention, hypothyroidism  She was seen by Dr. Marlou Schroeder 01/14/2020 for the evaluation of CHF at the request of Dr. Loanne Schroeder.  At that time, she was told 8 years ago that she has CHF however did not recall work-up with an echocardiogram.  She reported having worsening lower extremity edema for approximately 6 months with 4+ pitting edema and mild dyspnea on exertion.  Her Lasix was increased to 40 mg p.o. twice daily, previously taking 20 mg once daily.  She was noted to have excessive fluid volume overload on exam.  She was continued on carvedilol and amlodipine.  Lab work was obtained which showed a creatinine of 1.23, LDL at 37.  Echocardiogram to be performed 02/03/2020.  Patient is here today alone. She reports that her symptoms have remained the same since last OV.  Still presents with 2-3+ pitting lower extremity edema.  As above, has scheduled echocardiogram.  We will plan on increasing her Lasix therapy to 80 mg twice daily for 3 days then resume prior dosing at 40 mg twice daily.  Creatinine was mildly elevated at last lab work at 1.23 therefore we will monitor this closely.  Also noted to not be on potassium supplementation therefore will start this as well.  We will plan for close follow-up on day of echocardiogram to reassess LE edema.  She denies shortness of breath, palpitations, orthopnea, dizziness or syncope.   Past Medical History:  Diagnosis Date  . 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 No  pertinent surgical history.   Current Outpatient Medications  Medication Sig Dispense Refill  . amLODipine (NORVASC) 10 MG tablet Take 10 mg by mouth daily.    Marland Kitchen aspirin EC 81 MG tablet Take 81 mg by mouth daily.    . carvedilol (COREG) 25 MG tablet Take 25 mg by mouth 2 (two) times daily with a meal.    . furosemide (LASIX) 40 MG tablet Take 1 tablet (40 mg total) by mouth 2 (two) times daily. 180 tablet 1  . glucose blood test strip 1 each by Other route 3 (three) times daily. Use as instructed    . Insulin Glargine (BASAGLAR KWIKPEN [place]) Inject into the skin. 35 units BID    . Insulin Pen Needle (PEN NEEDLES) 31G X 5 MM MISC 1 each by Does not apply route daily.    Marland Kitchen levothyroxine (SYNTHROID) 75 MCG tablet Take 1 tablet (75 mcg total) by mouth daily before breakfast. 30 tablet 3  . losartan (COZAAR) 100 MG tablet Take 100 mg by mouth daily.    . metFORMIN (GLUCOPHAGE) 1000 MG tablet Take 1,000 mg by mouth 2 (two) times daily with a meal.    . simvastatin (ZOCOR) 20 MG tablet Take 20 mg by mouth daily.    . TRADJENTA 5 MG TABS tablet Take 5 mg by mouth daily.    . potassium chloride (KLOR-CON) 10 MEQ tablet Take 1 tablet (10 mEq total) by mouth daily. 90 tablet 1  No current facility-administered medications for this visit.    Allergies:   Patient has no known allergies.    Social History:  The patient  reports that she has never smoked. She has never used smokeless tobacco. She reports that she does not drink alcohol or use drugs.   Family History:  The patient's family history is not on file.    ROS:  Please see the history of present illness.  Otherwise, review of systems are positive for none.   All other systems are reviewed and negative.    PHYSICAL EXAM: VS:  BP (!) 152/82   Pulse 86   Ht 5\' 7"  (1.702 m)   Wt 209 lb 12.8 oz (95.2 kg)   SpO2 97%   BMI 32.86 kg/m  , BMI Body mass index is 32.86 kg/m.   General: Well developed, well nourished, NAD Neck: Negative for  carotid bruits. No JVD Lungs:Clear to ausculation bilaterally. No wheezes, rales, or rhonchi. Breathing is unlabored. Cardiovascular: RRR with S1 S2. + murmur Extremities: 2-3+ BLE edema. Radial pulses 2+ bilaterally Neuro: Alert and oriented. No focal deficits. No facial asymmetry. MAE spontaneously. Psych: Responds to questions appropriately with normal affect.     EKG:  EKG is not ordered today.   Recent Labs: 11/19/2019: TSH 0.36 01/14/2020: ALT 20; BUN 17; Creatinine, Ser 1.23; Hemoglobin 10.2; Platelets 214; Potassium 4.4; Sodium 141    Lipid Panel    Component Value Date/Time   CHOL 135 01/14/2020 1439   TRIG 81 01/14/2020 1439   HDL 82 01/14/2020 1439   CHOLHDL 1.6 01/14/2020 1439   LDLCALC 37 01/14/2020 1439      Wt Readings from Last 3 Encounters:  01/21/20 209 lb 12.8 oz (95.2 kg)  01/14/20 213 lb (96.6 kg)  12/24/19 212 lb 12.8 oz (96.5 kg)     ASSESSMENT AND PLAN:  1.  Chronic diastolic heart failure: -Presented as a new patient to Dr. Marlou Schroeder for the evaluation of CHF and lower extremity pitting edema at which time her Lasix was increased to 40 mg p.o. twice daily from 20 mg once daily.  She was continued on carvedilol and amlodipine.  An echocardiogram was scheduled however has not yet been performed.  Scheduled for 02/03/2020. -Today in follow-up, continues to have 2-3+ lower extremity edema which is not improved with increased Lasix dosing.  Therefore will increase Lasix to 80 mg twice daily for 3 days then resume prior dosing. -Obtain lab work today  -We will have her follow-up closely on day of echocardiogram to reassess LE edema. -Start K. Dur 10 mEq p.o. daily -Lungs are clear, no shortness of breath  2.  Hyperlipidemia: -Last LDL, 37 from lab work 01/14/2020 -Continue simvastatin however may opt to transition to atorvastatin for less drug drug interactions in the future  3.  Essential hypertension: -Slightly elevated today at 152/82 however reports that  she is not taking her morning medications -Lasix increased to 40 mg twice daily at last OV 01/14/2020 -Increase Lasix to 80 mg twice daily for 3 days then resume prior dosing -Obtain lab work -Close follow-up on 02/03/2020  4.  Pituitary adenoma/acromegaly: -Currently waiting for appointments for further work-up/treatment plan   Current medicines are reviewed at length with the patient today.  The patient does not have concerns regarding medicines.  The following changes have been made: Increase Lasix to 80 mg twice daily for 3 days then resume prior dosing  Labs/ tests ordered today include: BMET  Orders Placed This  Encounter  Procedures  . Basic metabolic panel    Disposition:   FU with Truitt Merle NP in 2 weeks    Signed, Kathyrn Drown, NP  01/21/2020 11:40 AM    [address], [place], [place]  [address] Phone: [phone]; Fax: [phone]

## 2020-01-21 ENCOUNTER — Other Ambulatory Visit: Payer: Self-pay

## 2020-01-21 ENCOUNTER — Encounter: Payer: Self-pay | Admitting: Cardiology

## 2020-01-21 ENCOUNTER — Ambulatory Visit: Payer: Medicare HMO | Admitting: Cardiology

## 2020-01-21 VITALS — BP 152/82 | HR 86 | Ht 67.0 in | Wt 209.8 lb

## 2020-01-21 DIAGNOSIS — R600 Localized edema: Secondary | ICD-10-CM | POA: Diagnosis not present

## 2020-01-21 DIAGNOSIS — I5033 Acute on chronic diastolic (congestive) heart failure: Secondary | ICD-10-CM | POA: Diagnosis not present

## 2020-01-21 DIAGNOSIS — Z79899 Other long term (current) drug therapy: Secondary | ICD-10-CM

## 2020-01-21 LAB — BASIC METABOLIC PANEL
BUN/Creatinine Ratio: 19 (ref 12–28)
BUN: 28 mg/dL — ABNORMAL HIGH (ref 8–27)
CO2: 24 mmol/L (ref 20–29)
Calcium: 10.1 mg/dL (ref 8.7–10.3)
Chloride: 99 mmol/L (ref 96–106)
Creatinine, Ser: 1.47 mg/dL — ABNORMAL HIGH (ref 0.57–1.00)
GFR calc Af Amer: 44 mL/min/{1.73_m2} — ABNORMAL LOW (ref >59)
GFR calc non Af Amer: 38 mL/min/{1.73_m2} — ABNORMAL LOW (ref >59)
Glucose: 130 mg/dL — ABNORMAL HIGH (ref 65–99)
Potassium: 4.1 mmol/L (ref 3.5–5.2)
Sodium: 139 mmol/L (ref 134–144)

## 2020-01-21 MED ORDER — POTASSIUM CHLORIDE ER 10 MEQ PO TBCR: 10.0000 meq | EXTENDED_RELEASE_TABLET | Freq: Every day | ORAL | 1 refills | Status: DC

## 2020-01-21 NOTE — Patient Instructions (Signed)
Medication Instructions:   Your physician has recommended you make the following change in your medication:   1) Increase Lasix to 80 mg, 2 tablets by mouth twice a day for 3 days, then resume regular dose of Lasix 40 mg, 1 tablet by mouth twice a day 2) Start Potassium 10 mEq, 1 tablet by mouth once a day  *If you need a refill on your cardiac medications before your next appointment, please call your pharmacy*  Lab Work:  You will have labs drawn today: BMET  If you have labs (blood work) drawn today and your tests are completely normal, you will receive your results only by: Marland Kitchen MyChart Message (if you have MyChart) OR . A paper copy in the mail If you have any lab test that is abnormal or we need to change your treatment, we will call you to review the results.  Testing/Procedures:  None ordered today  Follow-Up: At [facility], you and your health needs are our priority.  As part of our continuing mission to provide you with exceptional heart care, we have created designated Provider Care Teams.  These Care Teams include your primary Cardiologist (physician) and Advanced Practice Providers (APPs -  Physician Assistants and Nurse Practitioners) who all work together to provide you with the care you need, when you need it.  We recommend signing up for the patient portal called "MyChart".  Sign up information is provided on this After Visit Summary.  MyChart is used to connect with patients for Virtual Visits (Telemedicine).  Patients are able to view lab/test results, encounter notes, upcoming appointments, etc.  Non-urgent messages can be sent to your provider as well.   To learn more about what you can do with MyChart, go to [email or web address].    Your next appointment:    On 02/03/20 at 11:15AM with Truitt Merle, NP

## 2020-01-28 NOTE — Progress Notes (Signed)
CARDIOLOGY OFFICE NOTE  Date:  02/03/2020    Danielle Schroeder Date of Birth: 1956-05-27 Medical Record [id number]  PCP:  Vassie Moment, MD  Cardiologist:  [facility]   Chief Complaint  Patient presents with  . Follow-up    Seen for Dr. Marlou Porch    History of Present Illness: Danielle Schroeder is a 65 y.o. female who presents today for a follow up visit. Seen for Dr. Marlou Porch.   Danielle Schroeder has a history of growth hormone producing pituitary adenoma diagnosed approximately 1990 without surgical intervention, hypothyroidism  Danielle Schroeder was seen by Dr. Marlou Porch 01/14/2020 for the evaluation of CHF at the request of Dr. Loanne Drilling.  At that time, Danielle Schroeder was told 8 years ago that Danielle Schroeder has CHF however did not recall work-up with an echocardiogram.  Danielle Schroeder reported having worsening lower extremity edema for approximately 6 months with 4+ pitting edema and mild dyspnea on exertion.  Her Lasix was increased to 40 mg p.o. twice daily, previously taking 20 mg once daily.  Danielle Schroeder was noted to have excessive fluid volume overload on exam.  Danielle Schroeder was continued on carvedilol and amlodipine.  Lab work was obtained which showed a creatinine of 1.23, LDL at 37.    Seen by Sharee Pimple earlier this month - still with swelling - to have echo later this month. Lasix was increased at that visit.   The patient does not have symptoms concerning for COVID-19 infection (fever, chills, cough, or new shortness of breath).   Comes in today. Here alone. Danielle Schroeder feels much better. Swelling has improved. Not totally back to her baseline but certainly better. Danielle Schroeder is not short of breath. No chest pain. BP looks good. Danielle Schroeder took her medicines before coming today. Danielle Schroeder remains on the Lasix twice a day. Danielle Schroeder is doing much better with salt restriction.   Past Medical History:  Diagnosis Date  . 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 No pertinent surgical history.   Medications: Current Meds  Medication Sig  .  amLODipine (NORVASC) 10 MG tablet Take 10 mg by mouth daily.  Marland Kitchen aspirin EC 81 MG tablet Take 81 mg by mouth daily.  . carvedilol (COREG) 25 MG tablet Take 25 mg by mouth 2 (two) times daily with a meal.  . furosemide (LASIX) 40 MG tablet Take 1 tablet (40 mg total) by mouth 2 (two) times daily.  Marland Kitchen glucose blood test strip 1 each by Other route 3 (three) times daily. Use as instructed  . Insulin Glargine (BASAGLAR KWIKPEN [place]) Inject into the skin. 35 units BID  . Insulin Pen Needle (PEN NEEDLES) 31G X 5 MM MISC 1 each by Does not apply route daily.  Marland Kitchen levothyroxine (SYNTHROID) 75 MCG tablet Take 1 tablet (75 mcg total) by mouth daily before breakfast.  . losartan (COZAAR) 100 MG tablet Take 100 mg by mouth daily.  . metFORMIN (GLUCOPHAGE) 1000 MG tablet Take 1,000 mg by mouth 2 (two) times daily with a meal.  . potassium chloride (KLOR-CON) 10 MEQ tablet Take 1 tablet (10 mEq total) by mouth daily.  . simvastatin (ZOCOR) 20 MG tablet Take 20 mg by mouth daily.  . TRADJENTA 5 MG TABS tablet Take 5 mg by mouth daily.     Allergies: No Known Allergies  Social History: The patient  reports that Danielle Schroeder has never smoked. Danielle Schroeder has never used smokeless tobacco. Danielle Schroeder reports that Danielle Schroeder does not drink alcohol or use drugs.   Family History:  The patient's family history is not on file.   Review of Systems: Please see the history of present illness.   All other systems are reviewed and negative.   Physical Exam: VS:  BP 120/80   Pulse 78   Ht 5\' 7"  (1.702 m)   Wt 205 lb 12.8 oz (93.4 kg)   SpO2 97%   BMI 32.23 kg/m  .  BMI Body mass index is 32.23 kg/m.  Wt Readings from Last 3 Encounters:  02/03/20 205 lb 12.8 oz (93.4 kg)  01/21/20 209 lb 12.8 oz (95.2 kg)  01/14/20 213 lb (96.6 kg)    General: Alert and in no acute distress.  Danielle Schroeder has acromegaly.  HEENT: Normal.  Neck: Supple, no JVD, carotid bruits, or masses noted.  Cardiac: Regular rate and rhythm. No murmurs, rubs, or gallops. 1+  edema.  Respiratory:  Lungs are clear to auscultation bilaterally with normal work of breathing.  GI: Soft and nontender.  MS: No deformity or atrophy. Gait and ROM intact.  Skin: Warm and dry. Color is normal.  Neuro:  Strength and sensation are intact and no gross focal deficits noted.  Psych: Alert, appropriate and with normal affect.   LABORATORY DATA:  EKG:  EKG is not ordered today.   Lab Results  Component Value Date   WBC 5.2 01/14/2020   HGB 10.2 (L) 01/14/2020   HCT 32.4 (L) 01/14/2020   PLT 214 01/14/2020   GLUCOSE 130 (H) 01/21/2020   CHOL 135 01/14/2020   TRIG 81 01/14/2020   HDL 82 01/14/2020   LDLCALC 37 01/14/2020   ALT 20 01/14/2020   AST 20 01/14/2020   NA 139 01/21/2020   K 4.1 01/21/2020   CL 99 01/21/2020   CREATININE 1.47 (H) 01/21/2020   BUN 28 (H) 01/21/2020   CO2 24 01/21/2020   TSH 0.36 11/19/2019     BNP (last 3 results) No results for input(s): BNP in the last 8760 hours.  ProBNP (last 3 results) No results for input(s): PROBNP in the last 8760 hours.   Other Studies Reviewed Today:  Echo from earlier today is pending - tech notes EF appeared normal.   Assessment/Plan:  1. Chronic diastolic HF - Danielle Schroeder is improving - Danielle Schroeder is restricting her salt better (loves potato chips) now - weight continues to decline - BP looks good. Will leave her on her current regimen with the Lasix twice a day. Await echo results.   2. HTN - BP is better - Danielle Schroeder is on high dose Norvasc which may be contributing to the swelling. Will need to follow.   3. HLD - on Zocor  4. Acromegaly/pituitary adenoma - waiting for further work up.   5. COVID-19 Education: The signs and symptoms of COVID-19 were discussed with the patient and how to seek care for testing (follow up with PCP or arrange E-visit).  The importance of social distancing, staying at home, hand hygiene and wearing a mask when out in public were discussed today.  Current medicines are reviewed with the  patient today.  The patient does not have concerns regarding medicines other than what has been noted above.  The following changes have been made:  See above.  Labs/ tests ordered today include:    Orders Placed This Encounter  Procedures  . Basic metabolic panel     Disposition:   FU with [place] in about 2 months. Lab today.    Patient is agreeable to this plan and will call if  any problems develop in the interim.   SignedTruitt Merle, NP  02/03/2020 11:41 AM  [facility] [address] [place], [place]  [address] Phone: [phone] Fax: [phone]

## 2020-02-03 ENCOUNTER — Other Ambulatory Visit: Payer: Self-pay

## 2020-02-03 ENCOUNTER — Ambulatory Visit (HOSPITAL_COMMUNITY): Payer: Medicare HMO | Attending: Cardiology

## 2020-02-03 ENCOUNTER — Encounter: Payer: Self-pay | Admitting: Nurse Practitioner

## 2020-02-03 ENCOUNTER — Ambulatory Visit: Payer: Medicare HMO | Admitting: Nurse Practitioner

## 2020-02-03 VITALS — BP 120/80 | HR 78 | Ht 67.0 in | Wt 205.8 lb

## 2020-02-03 DIAGNOSIS — Z7189 Other specified counseling: Secondary | ICD-10-CM | POA: Diagnosis not present

## 2020-02-03 DIAGNOSIS — I5022 Chronic systolic (congestive) heart failure: Secondary | ICD-10-CM

## 2020-02-03 DIAGNOSIS — R600 Localized edema: Secondary | ICD-10-CM | POA: Diagnosis not present

## 2020-02-03 DIAGNOSIS — I5033 Acute on chronic diastolic (congestive) heart failure: Secondary | ICD-10-CM

## 2020-02-03 NOTE — Patient Instructions (Addendum)
After Visit Summary:  We will be checking the following labs today - BMET  We will call you about the echo results   Medication Instructions:    Continue with your current medicines.   Let's stay on the Lasix twice a day   If you need a refill on your cardiac medications before your next appointment, please call your pharmacy.     Testing/Procedures To Be Arranged:  N/A  Follow-Up:   See [place] in 2 months    At [facility], you and your health needs are our priority.  As part of our continuing mission to provide you with exceptional heart care, we have created designated Provider Care Teams.  These Care Teams include your primary Cardiologist (physician) and Advanced Practice Providers (APPs -  Physician Assistants and Nurse Practitioners) who all work together to provide you with the care you need, when you need it.  Special Instructions:  . Stay safe, stay home, wash your hands for at least 20 seconds and wear a mask when out in public.  . It was good to talk with you today.  Marland Kitchen Keep restricting the salt   Call the [place] office at [phone] if you have any questions, problems or concerns.

## 2020-02-04 LAB — BASIC METABOLIC PANEL
BUN/Creatinine Ratio: 19 (ref 12–28)
BUN: 26 mg/dL (ref 8–27)
CO2: 25 mmol/L (ref 20–29)
Calcium: 10.1 mg/dL (ref 8.7–10.3)
Chloride: 100 mmol/L (ref 96–106)
Creatinine, Ser: 1.34 mg/dL — ABNORMAL HIGH (ref 0.57–1.00)
GFR calc Af Amer: 49 mL/min/{1.73_m2} — ABNORMAL LOW (ref >59)
GFR calc non Af Amer: 42 mL/min/{1.73_m2} — ABNORMAL LOW (ref >59)
Glucose: 133 mg/dL — ABNORMAL HIGH (ref 65–99)
Potassium: 4.5 mmol/L (ref 3.5–5.2)
Sodium: 140 mmol/L (ref 134–144)

## 2020-03-02 ENCOUNTER — Other Ambulatory Visit: Payer: Self-pay

## 2020-03-02 ENCOUNTER — Ambulatory Visit (INDEPENDENT_AMBULATORY_CARE_PROVIDER_SITE_OTHER): Payer: Medicare HMO | Admitting: Ophthalmology

## 2020-03-02 ENCOUNTER — Encounter (INDEPENDENT_AMBULATORY_CARE_PROVIDER_SITE_OTHER): Payer: Self-pay | Admitting: Ophthalmology

## 2020-03-02 DIAGNOSIS — H3562 Retinal hemorrhage, left eye: Secondary | ICD-10-CM

## 2020-03-02 DIAGNOSIS — H35043 Retinal micro-aneurysms, unspecified, bilateral: Secondary | ICD-10-CM

## 2020-03-02 DIAGNOSIS — H3561 Retinal hemorrhage, right eye: Secondary | ICD-10-CM | POA: Diagnosis not present

## 2020-03-02 DIAGNOSIS — E113512 Type 2 diabetes mellitus with proliferative diabetic retinopathy with macular edema, left eye: Secondary | ICD-10-CM

## 2020-03-02 DIAGNOSIS — E113551 Type 2 diabetes mellitus with stable proliferative diabetic retinopathy, right eye: Secondary | ICD-10-CM | POA: Diagnosis not present

## 2020-03-02 HISTORY — DX: Retinal hemorrhage, left eye: H35.62

## 2020-03-02 HISTORY — DX: Retinal hemorrhage, right eye: H35.61

## 2020-03-02 HISTORY — DX: Retinal micro-aneurysms, unspecified, bilateral: H35.043

## 2020-03-02 NOTE — Progress Notes (Signed)
03/02/2020     CHIEF COMPLAINT Patient presents for Retina Follow Up   HISTORY OF PRESENT ILLNESS: Danielle Schroeder is a 64 y.o. female who presents to the clinic today for:   HPI    Retina Follow Up    Patient presents with  Diabetic Retinopathy.  In both eyes.  Duration of 6 months.  Since onset it is stable.          Comments    6 month follow up - OCT OU, FP OU Patient denies change in vision and overall has no complaints. LBS 153 this AM A1C 7.1 11/2019        Last edited by Gerda Diss on 03/02/2020  9:19 AM. (History)      Referring physician: Vassie Moment, MD [place],  [place] [id number]  HISTORICAL INFORMATION:   Selected notes from the MEDICAL RECORD NUMBER       CURRENT MEDICATIONS: No current outpatient medications on file. (Ophthalmic Drugs)   No current facility-administered medications for this visit. (Ophthalmic Drugs)   Current Outpatient Medications (Other)  Medication Sig  . amLODipine (NORVASC) 10 MG tablet Take 10 mg by mouth daily.  Marland Kitchen aspirin EC 81 MG tablet Take 81 mg by mouth daily.  . carvedilol (COREG) 25 MG tablet Take 25 mg by mouth 2 (two) times daily with a meal.  . furosemide (LASIX) 40 MG tablet Take 1 tablet (40 mg total) by mouth 2 (two) times daily.  Marland Kitchen glucose blood test strip 1 each by Other route 3 (three) times daily. Use as instructed  . Insulin Glargine (BASAGLAR KWIKPEN [place]) Inject into the skin. 35 units BID  . Insulin Pen Needle (PEN NEEDLES) 31G X 5 MM MISC 1 each by Does not apply route daily.  Marland Kitchen levothyroxine (SYNTHROID) 75 MCG tablet Take 1 tablet (75 mcg total) by mouth daily before breakfast.  . losartan (COZAAR) 100 MG tablet Take 100 mg by mouth daily.  . metFORMIN (GLUCOPHAGE) 1000 MG tablet Take 1,000 mg by mouth 2 (two) times daily with a meal.  . potassium chloride (KLOR-CON) 10 MEQ tablet Take 1 tablet (10 mEq total) by mouth daily.  . simvastatin (ZOCOR) 20 MG tablet Take 20 mg by mouth  daily.  . TRADJENTA 5 MG TABS tablet Take 5 mg by mouth daily.   No current facility-administered medications for this visit. (Other)      REVIEW OF SYSTEMS:    ALLERGIES No Known Allergies  PAST MEDICAL HISTORY Past Medical History:  Diagnosis Date  . Pituitary adenoma with extrasellar extension ([place])    History reviewed. No pertinent surgical history.  FAMILY HISTORY Family History  Problem Relation Age of Onset  . Thyroid disease Neg Hx     SOCIAL HISTORY Social History   Tobacco Use  . Smoking status: Never Smoker  . Smokeless tobacco: Never Used  Substance Use Topics  . Alcohol use: Never  . Drug use: Never         OPHTHALMIC EXAM: Base Eye Exam    Visual Acuity (Snellen - Linear)      Right Left   Dist [place] 20/60-2 20/20-2   Dist ph [place] 20/30-1        Tonometry (Tonopen, 9:25 AM)      Right Left   Pressure 22 21       Pupils      Pupils Dark Light Shape React APD   Right PERRL 3 3 Round Minimal None  Left PERRL 3 3 Round Minimal None       Visual Fields (Counting fingers)      Left Right    Full Full       Extraocular Movement      Right Left    Full Full       Neuro/Psych    Oriented x3: Yes   Mood/Affect: Normal       Dilation    Both eyes: 1.0% Mydriacyl, 2.5% Phenylephrine @ 9:25 AM        Slit Lamp and Fundus Exam    External Exam      Right Left   External Normal Normal       Slit Lamp Exam      Right Left   Lids/Lashes Normal Normal   Conjunctiva/Sclera White and quiet White and quiet   Cornea Clear Clear   Anterior Chamber Deep and quiet Deep and quiet   Iris Round and reactive Round and reactive   Lens Posterior chamber intraocular lens Posterior chamber intraocular lens   Anterior Vitreous Normal Normal       Fundus Exam      Right Left   C/D Ratio 0.3 0.2          IMAGING AND PROCEDURES  Imaging and Procedures for 03/02/20           ASSESSMENT/PLAN:  No problem-specific Assessment &  Plan notes found for this encounter.      ICD-10-CM   1. Stable treated proliferative diabetic retinopathy of right eye determined by examination associated with type 2 diabetes mellitus ([place])  [id number]   2. Proliferative diabetic retinopathy of left eye with macular edema associated with type 2 diabetes mellitus ([place])  [id number]   3. Retinal hemorrhage of left eye  H35.62   4. Retinal microaneurysm of both eyes  H35.043   5. Retinal hemorrhage of right eye  H35.61     1.  2.  3.  Ophthalmic Meds Ordered this visit:  No orders of the defined types were placed in this encounter.      No follow-ups on file.  There are no Patient Instructions on file for this visit.   Explained the diagnoses, plan, and follow up with the patient and they expressed understanding.  Patient expressed understanding of the importance of proper follow up care.   Clent Demark Ledarius Leeson M.D. Diseases & Surgery of the Retina and Vitreous Retina & Diabetic [place] 03/02/20     Abbreviations: M myopia (nearsighted); A astigmatism; H hyperopia (farsighted); P presbyopia; Mrx spectacle prescription;  CTL contact lenses; OD right eye; OS left eye; OU both eyes  XT exotropia; ET esotropia; PEK punctate epithelial keratitis; PEE punctate epithelial erosions; DES dry eye syndrome; MGD meibomian gland dysfunction; ATs artificial tears; PFAT's preservative free artificial tears; [place] nuclear sclerotic cataract; PSC posterior subcapsular cataract; ERM epi-retinal membrane; PVD posterior vitreous detachment; RD retinal detachment; DM diabetes mellitus; DR diabetic retinopathy; NPDR non-proliferative diabetic retinopathy; PDR proliferative diabetic retinopathy; CSME clinically significant macular edema; DME diabetic macular edema; dbh dot blot hemorrhages; CWS cotton wool spot; POAG primary open angle glaucoma; C/D cup-to-disc ratio; HVF humphrey visual field; GVF goldmann visual field; OCT optical coherence tomography; IOP  intraocular pressure; BRVO Branch retinal vein occlusion; CRVO central retinal vein occlusion; CRAO central retinal artery occlusion; BRAO branch retinal artery occlusion; RT retinal tear; SB scleral buckle; PPV pars plana vitrectomy; VH Vitreous hemorrhage; PRP panretinal laser photocoagulation; IVK intravitreal kenalog; VMT vitreomacular traction; MH Macular  hole;  NVD neovascularization of the disc; NVE neovascularization elsewhere; AREDS age related eye disease study; ARMD age related macular degeneration; POAG primary open angle glaucoma; EBMD epithelial/anterior basement membrane dystrophy; ACIOL anterior chamber intraocular lens; IOL intraocular lens; PCIOL posterior chamber intraocular lens; Phaco/IOL phacoemulsification with intraocular lens placement; [place] photorefractive keratectomy; LASIK laser assisted in situ keratomileusis; HTN hypertension; DM diabetes mellitus; COPD chronic obstructive pulmonary disease

## 2020-03-10 DIAGNOSIS — R69 Illness, unspecified: Secondary | ICD-10-CM | POA: Diagnosis not present

## 2020-03-11 DIAGNOSIS — I739 Peripheral vascular disease, unspecified: Secondary | ICD-10-CM | POA: Diagnosis not present

## 2020-03-11 DIAGNOSIS — L603 Nail dystrophy: Secondary | ICD-10-CM | POA: Diagnosis not present

## 2020-03-11 DIAGNOSIS — L84 Corns and callosities: Secondary | ICD-10-CM | POA: Diagnosis not present

## 2020-03-11 DIAGNOSIS — E1151 Type 2 diabetes mellitus with diabetic peripheral angiopathy without gangrene: Secondary | ICD-10-CM | POA: Diagnosis not present

## 2020-03-15 ENCOUNTER — Encounter: Payer: Self-pay | Admitting: Gastroenterology

## 2020-03-15 DIAGNOSIS — I10 Essential (primary) hypertension: Secondary | ICD-10-CM | POA: Diagnosis not present

## 2020-03-15 DIAGNOSIS — E119 Type 2 diabetes mellitus without complications: Secondary | ICD-10-CM | POA: Diagnosis not present

## 2020-03-15 DIAGNOSIS — E039 Hypothyroidism, unspecified: Secondary | ICD-10-CM | POA: Diagnosis not present

## 2020-03-15 DIAGNOSIS — N183 Chronic kidney disease, stage 3 unspecified: Secondary | ICD-10-CM | POA: Diagnosis not present

## 2020-03-22 ENCOUNTER — Ambulatory Visit (INDEPENDENT_AMBULATORY_CARE_PROVIDER_SITE_OTHER): Payer: Medicare HMO | Admitting: Endocrinology

## 2020-03-22 ENCOUNTER — Encounter: Payer: Self-pay | Admitting: Endocrinology

## 2020-03-22 ENCOUNTER — Other Ambulatory Visit: Payer: Self-pay

## 2020-03-22 VITALS — BP 112/70 | HR 89 | Ht 67.0 in | Wt 213.0 lb

## 2020-03-22 DIAGNOSIS — E039 Hypothyroidism, unspecified: Secondary | ICD-10-CM | POA: Diagnosis not present

## 2020-03-22 NOTE — Progress Notes (Signed)
Subjective:    Patient ID: Danielle Schroeder, female    DOB: 08/09/1956, 64 y.o.   MRN: [id number]  HPI Pt returns for f/u of GH-producing pituitary adenoma (dx'ed at uncertain time, but was prob approx 1990; she was she took some injections for this x a few mos, but she has been on no rx since then; she never had pituitary surgery).   She also has small MNG (dx'ed 2020; no nodule needed bx; plan is for f/u US) She has these pit funct tests: TSH: TFT are normal on synthroid (hypothyroidism is prob central). GH: high.  Pt has seen NS, but did not ret for f/u Prolactin: normal, even with dilution FSH/LH: low ACTH/cortisol: random are normal VP: low (with normal Na+) A-subunit: normal Pt says she is overdue for f/u with NS.  She does not have f/u appt. pt states she feels well in general.   Past Medical History:  Diagnosis Date  . Cataract    bilateral lens implants  . CHF (congestive heart failure) ([place])   . Diabetes mellitus without complication ([place])   . Hyperlipidemia   . Hypertension   . Pituitary adenoma with extrasellar extension ([place])   . Sleep apnea    no c-pap  . Thyroid disease    hypothyroidism    Past Surgical History:  Procedure Laterality Date  . BREAST SURGERY    . CHOLECYSTECTOMY    . TONSILLECTOMY    . TUBAL LIGATION      Social History   Socioeconomic History  . Marital status: Single    Spouse name: Not on file  . Number of children: Not on file  . Years of education: Not on file  . Highest education level: Not on file  Occupational History  . Not on file  Tobacco Use  . Smoking status: Never Smoker  . Smokeless tobacco: Never Used  Substance and Sexual Activity  . Alcohol use: Never  . Drug use: Never  . Sexual activity: Not Currently  Other Topics Concern  . Not on file  Social History Narrative  . Not on file   Social Determinants of Health   Financial Resource Strain:   . Difficulty of Paying Living Expenses:   Food Insecurity:    . Worried About Charity fundraiser in the Last Year:   . Arboriculturist in the Last Year:   Transportation Needs:   . Film/video editor (Medical):   Marland Kitchen Lack of Transportation (Non-Medical):   Physical Activity:   . Days of Exercise per Week:   . Minutes of Exercise per Session:   Stress:   . Feeling of Stress :   Social Connections:   . Frequency of Communication with Friends and Family:   . Frequency of Social Gatherings with Friends and Family:   . Attends Religious Services:   . Active Member of Clubs or Organizations:   . Attends Archivist Meetings:   Marland Kitchen Marital Status:   Intimate Partner Violence:   . Fear of Current or Ex-Partner:   . Emotionally Abused:   Marland Kitchen Physically Abused:   . Sexually Abused:     Current Outpatient Medications on File Prior to Visit  Medication Sig Dispense Refill  . amLODipine (NORVASC) 10 MG tablet Take 10 mg by mouth daily.    Marland Kitchen aspirin EC 81 MG tablet Take 81 mg by mouth daily.    . carvedilol (COREG) 25 MG tablet Take 25 mg by mouth 2 (two)  times daily with a meal.    . furosemide (LASIX) 40 MG tablet Take 1 tablet (40 mg total) by mouth 2 (two) times daily. 180 tablet 1  . glucose blood test strip 1 each by Other route 3 (three) times daily. Use as instructed    . Insulin Glargine (BASAGLAR KWIKPEN [place]) Inject into the skin. 35 units BID    . Insulin Pen Needle (PEN NEEDLES) 31G X 5 MM MISC 1 each by Does not apply route daily.    Marland Kitchen levothyroxine (SYNTHROID) 75 MCG tablet Take 1 tablet (75 mcg total) by mouth daily before breakfast. 30 tablet 3  . losartan (COZAAR) 100 MG tablet Take 100 mg by mouth daily.    . metFORMIN (GLUCOPHAGE) 1000 MG tablet Take 1,000 mg by mouth 2 (two) times daily with a meal.    . potassium chloride (KLOR-CON) 10 MEQ tablet Take 1 tablet (10 mEq total) by mouth daily. 90 tablet 1  . simvastatin (ZOCOR) 20 MG tablet Take 20 mg by mouth daily.    . TRADJENTA 5 MG TABS tablet Take 5 mg by mouth daily.      No current facility-administered medications on file prior to visit.    No Known Allergies  Family History  Problem Relation Age of Onset  . Thyroid disease Neg Hx   . Colon cancer Neg Hx   . Esophageal cancer Neg Hx   . Rectal cancer Neg Hx   . Stomach cancer Neg Hx     BP 112/70   Pulse 89   Ht 5' 7"  (1.702 m)   Wt 213 lb (96.6 kg)   SpO2 92%   BMI 33.36 kg/m    Review of Systems     Objective:   Physical Exam   Lab Results  Component Value Date   TSH 0.36 11/19/2019      Assessment & Plan:  Pituitary tumor.  she needs to see NS Hypothyroidism: well-replaced.  Please continue the same medication.   Patient Instructions  Please go back to see the neurosurgery specialist.   Blood tests are requested for you today.  We'll let you know about the results.  Please come back for a follow-up appointment in 3 months.

## 2020-03-22 NOTE — Patient Instructions (Addendum)
Please go back to see the neurosurgery specialist.   Blood tests are requested for you today.  We'll let you know about the results.  Please come back for a follow-up appointment in 3 months.

## 2020-03-23 ENCOUNTER — Ambulatory Visit (AMBULATORY_SURGERY_CENTER): Payer: Self-pay | Admitting: *Deleted

## 2020-03-23 ENCOUNTER — Other Ambulatory Visit: Payer: Self-pay

## 2020-03-23 VITALS — Temp 97.6°F | Ht 67.0 in | Wt 213.0 lb

## 2020-03-23 DIAGNOSIS — Z01818 Encounter for other preprocedural examination: Secondary | ICD-10-CM

## 2020-03-23 DIAGNOSIS — Z1211 Encounter for screening for malignant neoplasm of colon: Secondary | ICD-10-CM

## 2020-03-23 MED ORDER — NA SULFATE-K SULFATE-MG SULF 17.5-3.13-1.6 GM/177ML PO SOLN: 1.0000 | Freq: Once | ORAL | 0 refills | Status: AC

## 2020-03-23 NOTE — Progress Notes (Signed)

## 2020-03-24 NOTE — Progress Notes (Signed)
CARDIOLOGY OFFICE NOTE  Date:  03/30/2020    [name] Date of Birth: 1956-06-10 Medical Record [id number]  PCP:  Vassie Moment, MD  Cardiologist:  Marisa Cyphers   Chief Complaint  Patient presents with  . Follow-up    History of Present Illness: Danielle Schroeder is a 64 y.o. female who presents today for a 2 month check. Seen for Dr. Marlou Porch.   Ms. Tennenbaum a history of growth hormone producing pituitary adenoma diagnosed approximately 1990 without surgical intervention and hypothyroidism. She has been referred to Endocrine and Neurosurgery for evaluation.   She was seen by Dr. Marlou Porch 01/14/2020 for the evaluation of CHF at the request of Dr. Loanne Drilling. At that time, she was told 8 years ago that she has CHF however did not recall work-up with an echocardiogram. She reported having worsening lower extremity edema for approximately 6 months with 4+ pitting edema and mild dyspnea on exertion. Her Lasix was increased to 40 mg p.o. twice daily, previously taking 20 mg once daily. She was noted to have excessive fluid volume overload on exam. She was continued on carvedilol and amlodipine. Lab work was obtained which showed a creatinine of 1.23, LDL at 37. She saw Sharee Pimple in March - Lasix was increased. I then saw her in April - she was better with no SOB and less edema. Seemed to be doing better with salt restriction. She really likes potato chips.   The patient does not have symptoms concerning for COVID-19 infection (fever, chills, cough, or new shortness of breath).   Comes in today. Here alone. Feels good. Breathing is good. Not short of breath. Watching her salt. Weight went up - now coming back down. She is for a colonoscopy in early June - routine. Has issues with constipation chronically. She was also a little anemic on her lab from early March noted. 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 She is not sure. Her swelling is continuing to improve - nothing like it has been in the past - remains on high dose Norvasc as well. She feels like she is doing well.   Past Medical History:  Diagnosis Date  . Cataract    bilateral lens implants  . CHF (congestive heart failure) ([place])   . Diabetes mellitus without complication ([place])   . Hyperlipidemia   . Hypertension   . Pituitary adenoma with extrasellar extension ([place])   . Sleep apnea    no c-pap  . Thyroid disease    hypothyroidism    Past Surgical History:  Procedure Laterality Date  . BREAST SURGERY    . CHOLECYSTECTOMY    . TONSILLECTOMY    . TUBAL LIGATION       Medications: Current Meds  Medication Sig  . amLODipine (NORVASC) 10 MG tablet Take 10 mg by mouth daily.  Marland Kitchen aspirin EC 81 MG tablet Take 81 mg by mouth daily.  . carvedilol (COREG) 25 MG tablet Take 25 mg by mouth 2 (two) times daily with a meal.  . furosemide (LASIX) 40 MG tablet Take 1 tablet (40 mg total) by mouth 2 (two) times daily.  Marland Kitchen glucose blood test strip 1 each by Other route 3 (three) times daily. Use as instructed  . Insulin Glargine (BASAGLAR KWIKPEN [place]) Inject into the skin. 35 units BID  . Insulin Pen Needle (PEN NEEDLES)  31G X 5 MM MISC 1 each by Does not apply route daily.  Marland Kitchen levothyroxine (SYNTHROID) 75 MCG tablet Take 1 tablet (75 mcg total) by mouth daily before breakfast.  . losartan (COZAAR) 100 MG tablet Take 100 mg by mouth daily.  . metFORMIN (GLUCOPHAGE) 1000 MG tablet Take 1,000 mg by mouth 2 (two) times daily with a meal.  . potassium chloride (KLOR-CON) 10 MEQ tablet Take 1 tablet (10 mEq total) by mouth daily.  . simvastatin (ZOCOR) 20 MG tablet Take 20 mg by mouth daily.  . TRADJENTA 5 MG TABS tablet Take 5 mg by mouth daily.     Allergies: No Known Allergies  Social History: The patient  reports that she has never smoked. She has never used smokeless tobacco. She reports  that she does not drink alcohol or use drugs.   Family History: The patient's family history is not on file.   Review of Systems: Please see the history of present illness.   All other systems are reviewed and negative.   Physical Exam: VS:  BP 132/84   Pulse 80   Ht 5\' 7"  (1.702 m)   Wt 209 lb (94.8 kg)   SpO2 97%   BMI 32.73 kg/m  .  BMI Body mass index is 32.73 kg/m.  Wt Readings from Last 3 Encounters:  03/30/20 209 lb (94.8 kg)  03/23/20 213 lb (96.6 kg)  03/22/20 213 lb (96.6 kg)    General: Alert and in no acute distress. She weighed 205# at our last visit from March. She has acromegaly.  Cardiac: Regular rate and rhythm. No murmurs, rubs, or gallops. 1+ bilateral edema - her legs are full.   Respiratory:  Lungs are clear to auscultation bilaterally with normal work of breathing.  GI: Soft and nontender.  MS: No deformity or atrophy. Gait and ROM intact.  Skin: Warm and dry. Color is normal.  Neuro:  Strength and sensation are intact and no gross focal deficits noted.  Psych: Alert, appropriate and with normal affect.   LABORATORY DATA:  EKG:  EKG is not ordered today.    Lab Results  Component Value Date   WBC 5.2 01/14/2020   HGB 10.2 (L) 01/14/2020   HCT 32.4 (L) 01/14/2020   PLT 214 01/14/2020   GLUCOSE 133 (H) 02/03/2020   CHOL 135 01/14/2020   TRIG 81 01/14/2020   HDL 82 01/14/2020   LDLCALC 37 01/14/2020   ALT 20 01/14/2020   AST 20 01/14/2020   NA 140 02/03/2020   K 4.5 02/03/2020   CL 100 02/03/2020   CREATININE 1.34 (H) 02/03/2020   BUN 26 02/03/2020   CO2 25 02/03/2020   TSH 0.36 11/19/2019     BNP (last 3 results) No results for input(s): BNP in the last 8760 hours.  ProBNP (last 3 results) No results for input(s): PROBNP in the last 8760 hours.   Other Studies Reviewed Today:  ECHO IMPRESSIONS 01/2020  1. Left ventricular ejection fraction, by estimation, is 55 to 60%. The  left ventricle has normal function. The left  ventricle has no regional  wall motion abnormalities. There is severe asymmetric left ventricular  hypertrophy of the basal-septal  segment. No LVOT obstruction. Left ventricular diastolic parameters are  indeterminate.  2. Right ventricular systolic function is normal. The right ventricular  size is normal. There is mildly elevated pulmonary artery systolic  pressure. The estimated right ventricular systolic pressure is 123XX123 mmHg.  3. The mitral valve is normal in  structure. Trivial mitral valve  regurgitation.  4. The aortic valve was not well visualized. Aortic valve regurgitation  is not visualized. Mild to moderate aortic valve sclerosis/calcification  is present, without any evidence of aortic stenosis.  5. Aortic dilatation noted. 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  6. The inferior vena cava is normal in size with greater than 50%  respiratory variability, suggesting right atrial pressure of 3 mmHg.   Assessment/Plan:  1 Chronic diastolic HF - stable clinically. She feels like she is doing well - trying to watch her salt. BP is ok. Could consider Aldactone if needed and try to reduce dose of Norvasc if needed going forward. Lab today. Salt restriction imperative. Continue Lasix and Losartan.   2. HTN - BP is ok here today - no changes made. Has severe asymmetric septal hypertrophy on echo - no obstruction. Remains on beta blocker/CCB/ARB and diuretic therapy.   3. HLD - on statin  4. Anemia - unclear etiology - has upcoming colonoscopy - ok to proceed from our standpoint.   5. Acromegaly/pituitary adenoma - plan per Endocrine/Neurosurgery.   6. COVID-19 Education: The signs and symptoms of COVID-19 were discussed with the patient and how to seek care for testing (follow up with PCP or arrange E-visit).  The importance of social distancing, staying at home, hand hygiene and wearing a mask when out in public were discussed today.  Current  medicines are reviewed with the patient today.  The patient does not have concerns regarding medicines other than what has been noted above.  The following changes have been made:  See above.  Labs/ tests ordered today include:    Orders Placed This Encounter  Procedures  . Basic metabolic panel  . CBC     Disposition:   FU with Dr. Marlou Porch in about 3 months.    Patient is agreeable to this plan and will call if any problems develop in the interim.   SignedTruitt Merle, NP  03/30/2020 11:45 AM  [facility] [address] [place], [place]  [address] Phone: [phone] Fax: [phone]

## 2020-03-26 DIAGNOSIS — I10 Essential (primary) hypertension: Secondary | ICD-10-CM | POA: Diagnosis not present

## 2020-03-26 DIAGNOSIS — D352 Benign neoplasm of pituitary gland: Secondary | ICD-10-CM | POA: Diagnosis not present

## 2020-03-26 DIAGNOSIS — Z6833 Body mass index (BMI) 33.0-33.9, adult: Secondary | ICD-10-CM | POA: Diagnosis not present

## 2020-03-30 ENCOUNTER — Ambulatory Visit (INDEPENDENT_AMBULATORY_CARE_PROVIDER_SITE_OTHER): Payer: Medicare HMO | Admitting: Nurse Practitioner

## 2020-03-30 ENCOUNTER — Encounter: Payer: Self-pay | Admitting: Nurse Practitioner

## 2020-03-30 ENCOUNTER — Other Ambulatory Visit: Payer: Self-pay

## 2020-03-30 VITALS — BP 132/84 | HR 80 | Ht 67.0 in | Wt 209.0 lb

## 2020-03-30 DIAGNOSIS — D352 Benign neoplasm of pituitary gland: Secondary | ICD-10-CM | POA: Diagnosis not present

## 2020-03-30 DIAGNOSIS — R600 Localized edema: Secondary | ICD-10-CM

## 2020-03-30 DIAGNOSIS — I5032 Chronic diastolic (congestive) heart failure: Secondary | ICD-10-CM | POA: Diagnosis not present

## 2020-03-30 DIAGNOSIS — E220 Acromegaly and pituitary gigantism: Secondary | ICD-10-CM | POA: Diagnosis not present

## 2020-03-30 DIAGNOSIS — Z79899 Other long term (current) drug therapy: Secondary | ICD-10-CM | POA: Diagnosis not present

## 2020-03-30 NOTE — Patient Instructions (Addendum)
After Visit Summary:  We will be checking the following labs today - BMET & CBC   Medication Instructions:    Continue with your current medicines.    If you need a refill on your cardiac medications before your next appointment, please call your pharmacy.     Testing/Procedures To Be Arranged:  N/A  Follow-Up:   See Dr. Marlou Porch in about 3 months.     At [facility], you and your health needs are our priority.  As part of our continuing mission to provide you with exceptional heart care, we have created designated Provider Care Teams.  These Care Teams include your primary Cardiologist (physician) and Advanced Practice Providers (APPs -  Physician Assistants and Nurse Practitioners) who all work together to provide you with the care you need, when you need it.  Special Instructions:  . Stay safe, stay home, wash your hands for at least 20 seconds and wear a mask when out in public.  . It was good to talk with you today.  . It will be ok to proceed with your colonoscopy from our standpoint and you can hold your aspirin per GI's recommendations.    Call the [place] office at [phone] if you have any questions, problems or concerns.

## 2020-03-31 ENCOUNTER — Other Ambulatory Visit: Payer: Self-pay | Admitting: Radiation Therapy

## 2020-03-31 DIAGNOSIS — D352 Benign neoplasm of pituitary gland: Secondary | ICD-10-CM

## 2020-03-31 LAB — CBC
Hematocrit: 32.4 % — ABNORMAL LOW (ref 34.0–46.6)
Hemoglobin: 10.7 g/dL — ABNORMAL LOW (ref 11.1–15.9)
MCH: 28.4 pg (ref 26.6–33.0)
MCHC: 33 g/dL (ref 31.5–35.7)
MCV: 86 fL (ref 79–97)
Platelets: 188 10*3/uL (ref 150–450)
RBC: 3.77 x10E6/uL (ref 3.77–5.28)
RDW: 13.9 % (ref 11.7–15.4)
WBC: 4.8 10*3/uL (ref 3.4–10.8)

## 2020-03-31 LAB — BASIC METABOLIC PANEL
BUN/Creatinine Ratio: 18 (ref 12–28)
BUN: 26 mg/dL (ref 8–27)
CO2: 24 mmol/L (ref 20–29)
Calcium: 10.1 mg/dL (ref 8.7–10.3)
Chloride: 99 mmol/L (ref 96–106)
Creatinine, Ser: 1.41 mg/dL — ABNORMAL HIGH (ref 0.57–1.00)
GFR calc Af Amer: 46 mL/min/{1.73_m2} — ABNORMAL LOW (ref >59)
GFR calc non Af Amer: 40 mL/min/{1.73_m2} — ABNORMAL LOW (ref >59)
Glucose: 143 mg/dL — ABNORMAL HIGH (ref 65–99)
Potassium: 4.1 mmol/L (ref 3.5–5.2)
Sodium: 142 mmol/L (ref 134–144)

## 2020-03-31 NOTE — Progress Notes (Signed)
lab

## 2020-04-06 ENCOUNTER — Encounter: Payer: Medicare HMO | Admitting: Gastroenterology

## 2020-04-07 ENCOUNTER — Encounter: Payer: Self-pay | Admitting: Gastroenterology

## 2020-04-19 ENCOUNTER — Ambulatory Visit (INDEPENDENT_AMBULATORY_CARE_PROVIDER_SITE_OTHER): Payer: Medicare HMO

## 2020-04-19 ENCOUNTER — Other Ambulatory Visit: Payer: Self-pay | Admitting: Gastroenterology

## 2020-04-19 DIAGNOSIS — R69 Illness, unspecified: Secondary | ICD-10-CM | POA: Diagnosis not present

## 2020-04-19 DIAGNOSIS — Z1159 Encounter for screening for other viral diseases: Secondary | ICD-10-CM

## 2020-04-19 LAB — SARS CORONAVIRUS 2 (TAT 6-24 HRS): SARS Coronavirus 2: NEGATIVE

## 2020-04-21 ENCOUNTER — Ambulatory Visit (AMBULATORY_SURGERY_CENTER): Payer: Medicare HMO | Admitting: Gastroenterology

## 2020-04-21 ENCOUNTER — Other Ambulatory Visit: Payer: Self-pay

## 2020-04-21 ENCOUNTER — Encounter: Payer: Self-pay | Admitting: Gastroenterology

## 2020-04-21 VITALS — BP 157/92 | HR 84 | Temp 97.1°F | Resp 17 | Ht 67.0 in

## 2020-04-21 DIAGNOSIS — D123 Benign neoplasm of transverse colon: Secondary | ICD-10-CM

## 2020-04-21 DIAGNOSIS — Z1211 Encounter for screening for malignant neoplasm of colon: Secondary | ICD-10-CM

## 2020-04-21 DIAGNOSIS — D124 Benign neoplasm of descending colon: Secondary | ICD-10-CM | POA: Diagnosis not present

## 2020-04-21 DIAGNOSIS — D125 Benign neoplasm of sigmoid colon: Secondary | ICD-10-CM

## 2020-04-21 DIAGNOSIS — K635 Polyp of colon: Secondary | ICD-10-CM | POA: Diagnosis not present

## 2020-04-21 MED ORDER — SODIUM CHLORIDE 0.9 % IV SOLN: 500.0000 mL | Freq: Once | INTRAVENOUS | Status: DC

## 2020-04-21 NOTE — Progress Notes (Signed)
1458- abd binder placed on pt( per order Dr Silverio Decamp )during procedure in attempt to reach cecum

## 2020-04-21 NOTE — Patient Instructions (Signed)
Please read handouts provided. Continue present medications. Await pathology results.   YOU HAD AN ENDOSCOPIC PROCEDURE TODAY AT THE [facility]:   Refer to the procedure report that was given to you for any specific questions about what was found during the examination.  If the procedure report does not answer your questions, please call your gastroenterologist to clarify.  If you requested that your care partner not be given the details of your procedure findings, then the procedure report has been included in a sealed envelope for you to review at your convenience later.  YOU SHOULD EXPECT: Some feelings of bloating in the abdomen. Passage of more gas than usual.  Walking can help get rid of the air that was put into your GI tract during the procedure and reduce the bloating. If you had a lower endoscopy (such as a colonoscopy or flexible sigmoidoscopy) you may notice spotting of blood in your stool or on the toilet paper. If you underwent a bowel prep for your procedure, you may not have a normal bowel movement for a few days.  Please Note:  You might notice some irritation and congestion in your nose or some drainage.  This is from the oxygen used during your procedure.  There is no need for concern and it should clear up in a day or so.  SYMPTOMS TO REPORT IMMEDIATELY:  Following lower endoscopy (colonoscopy or flexible sigmoidoscopy):  Excessive amounts of blood in the stool  Significant tenderness or worsening of abdominal pains  Swelling of the abdomen that is new, acute  Fever of 100F or higher   For urgent or emergent issues, a gastroenterologist can be reached at any hour by calling [phone]. Do not use MyChart messaging for urgent concerns.    DIET:  We do recommend a small meal at first, but then you may proceed to your regular diet.  Drink plenty of fluids but you should avoid alcoholic beverages for 24 hours.  ACTIVITY:  You should plan to take it easy  for the rest of today and you should NOT DRIVE or use heavy machinery until tomorrow (because of the sedation medicines used during the test).    FOLLOW UP: Our staff will call the number listed on your records 48-72 hours following your procedure to check on you and address any questions or concerns that you may have regarding the information given to you following your procedure. If we do not reach you, we will leave a message.  We will attempt to reach you two times.  During this call, we will ask if you have developed any symptoms of COVID 19. If you develop any symptoms (ie: fever, flu-like symptoms, shortness of breath, cough etc.) before then, please call [phone].  If you test positive for Covid 19 in the 2 weeks post procedure, please call and report this information to us.    If any biopsies were taken you will be contacted by phone or by letter within the next 1-3 weeks.  Please call us at [phone] if you have not heard about the biopsies in 3 weeks.    SIGNATURES/CONFIDENTIALITY: You and/or your care partner have signed paperwork which will be entered into your electronic medical record.  These signatures attest to the fact that that the information above on your After Visit Summary has been reviewed and is understood.  Full responsibility of the confidentiality of this discharge information lies with you and/or your care-partner.  

## 2020-04-21 NOTE — Progress Notes (Signed)
A/ox3, pleased with MAC, report to RN 

## 2020-04-21 NOTE — Progress Notes (Signed)
Called to room to assist during endoscopic procedure.  Patient ID and intended procedure confirmed with present staff. Received instructions for my participation in the procedure from the performing physician.  

## 2020-04-21 NOTE — Op Note (Signed)
[facility] Patient Name: Danielle Schroeder Procedure Date: 04/21/2020 2:22 PM MRN: [id number] Endoscopist: Mauri Pole , MD Age: 64 Referring MD:  Date of Birth: 08-29-1956 Gender: Female Account #: [id number] Procedure:                Colonoscopy Indications:              Screening for colorectal malignant neoplasm Medicines:                Monitored Anesthesia Care Procedure:                Pre-Anesthesia Assessment:                           - Prior to the procedure, a History and Physical                            was performed, and patient medications and                            allergies were reviewed. The patient's tolerance of                            previous anesthesia was also reviewed. The risks                            and benefits of the procedure and the sedation                            options and risks were discussed with the patient.                            All questions were answered, and informed consent                            was obtained. Prior Anticoagulants: The patient has                            taken no previous anticoagulant or antiplatelet                            agents. ASA Grade Assessment: III - A patient with                            severe systemic disease. After reviewing the risks                            and benefits, the patient was deemed in                            satisfactory condition to undergo the procedure.                           After obtaining informed consent, the colonoscope  was passed under direct vision. Throughout the                            procedure, the patient's blood pressure, pulse, and                            oxygen saturations were monitored continuously. The                            Colonoscope was introduced through the anus and                            advanced to the the cecum, identified by                            appendiceal  orifice and ileocecal valve. The                            quality of the bowel preparation was adequate. The                            ileocecal valve, appendiceal orifice, and rectum                            were photographed. The colonoscopy was somewhat                            difficult due to a redundant colon and the                            patient's body habitus. Successful completion of                            the procedure was aided by applying abdominal                            pressure. The patient tolerated the procedure well. Scope In: 2:36:04 PM Scope Out: 3:14:37 PM Scope Withdrawal Time: 0 hours 13 minutes 9 seconds  Total Procedure Duration: 0 hours 38 minutes 33 seconds  Findings:                 The perianal and digital rectal examinations were                            normal.                           A 18 mm polyp was found in the transverse colon.                            The polyp was pedunculated. The polyp was removed                            with a hot snare. Resection and retrieval were  complete.                           A 5 mm polyp was found in the descending colon. The                            polyp was sessile. The polyp was removed with a                            cold snare. Resection and retrieval were complete.                           Two sessile polyps were found in the sigmoid colon                            and descending colon. The polyps were 1 to 2 mm in                            size. These polyps were removed with a cold biopsy                            forceps. Resection and retrieval were complete.                           Scattered small and large-mouthed diverticula were                            found in the recto-sigmoid colon, sigmoid colon,                            descending colon, transverse colon, ascending colon                            and cecum.                            Non-bleeding internal hemorrhoids were found during                            retroflexion. The hemorrhoids were medium-sized. Complications:            No immediate complications. Estimated Blood Loss:     Estimated blood loss was minimal. Impression:               - One 18 mm polyp in the transverse colon, removed                            with a hot snare. Resected and retrieved.                           - One 5 mm polyp in the descending colon, removed                            with a cold snare. Resected and retrieved.                           -  Two 1 to 2 mm polyps in the sigmoid colon and in                            the descending colon, removed with a cold biopsy                            forceps. Resected and retrieved.                           - Moderate diverticulosis in the recto-sigmoid                            colon, in the sigmoid colon, in the descending                            colon, in the transverse colon, in the ascending                            colon and in the cecum.                           - Non-bleeding internal hemorrhoids. Recommendation:           - Patient has a contact number available for                            emergencies. The signs and symptoms of potential                            delayed complications were discussed with the                            patient. Return to normal activities tomorrow.                            Written discharge instructions were provided to the                            patient.                           - Resume previous diet.                           - Continue present medications.                           - Await pathology results.                           - Repeat colonoscopy in 3 years for surveillance                            based on pathology results. Mauri Pole, MD 04/21/2020 3:25:28 PM This report has been signed electronically.

## 2020-04-23 ENCOUNTER — Telehealth: Payer: Self-pay

## 2020-04-23 NOTE — Telephone Encounter (Signed)
  Follow up Call-  Call back number 04/21/2020  Post procedure Call Back phone  # [phone]  Permission to leave phone message Yes  Some recent data might be hidden     Patient questions:  Do you have a fever, pain , or abdominal swelling? No. Pain Score  0 *  Have you tolerated food without any problems? Yes.    Have you been able to return to your normal activities? Yes.    Do you have any questions about your discharge instructions: Diet   No. Medications  No. Follow up visit  No.  Do you have questions or concerns about your Care? No.  Actions: * If pain score is 4 or above: No action needed, pain <4.  1. Have you developed a fever since your procedure? no  2.   Have you had an respiratory symptoms (SOB or cough) since your procedure? no  3.   Have you tested positive for COVID 19 since your procedure no  4.   Have you had any family members/close contacts diagnosed with the COVID 19 since your procedure?  no   If yes to any of these questions please route to Joylene John, RN and Erenest Rasher, RN

## 2020-04-23 NOTE — Telephone Encounter (Signed)
First post procedure follow up call, no answer 

## 2020-04-27 ENCOUNTER — Encounter: Payer: Self-pay | Admitting: Gastroenterology

## 2020-04-29 ENCOUNTER — Other Ambulatory Visit: Payer: Self-pay

## 2020-04-29 ENCOUNTER — Ambulatory Visit
Admission: RE | Admit: 2020-04-29 | Discharge: 2020-04-29 | Disposition: A | Discharge status: Home / Self Care | Payer: Medicare HMO | Source: Ambulatory Visit | Attending: Radiation Oncology | Admitting: Radiation Oncology

## 2020-04-29 DIAGNOSIS — D352 Benign neoplasm of pituitary gland: Secondary | ICD-10-CM | POA: Diagnosis not present

## 2020-04-29 MED ORDER — GADOBENATE DIMEGLUMINE 529 MG/ML IV SOLN
10.0000 mL | Freq: Once | INTRAVENOUS | Status: AC | PRN
  Administered 2020-04-29: 10 mL via INTRAVENOUS

## 2020-04-30 ENCOUNTER — Other Ambulatory Visit: Payer: Self-pay | Admitting: Nurse Practitioner

## 2020-04-30 ENCOUNTER — Other Ambulatory Visit: Payer: Self-pay | Admitting: Family Medicine

## 2020-04-30 DIAGNOSIS — Z1231 Encounter for screening mammogram for malignant neoplasm of breast: Secondary | ICD-10-CM

## 2020-04-30 NOTE — Progress Notes (Signed)
Location/Histology of Brain Tumor:  Pituitary adenoma  Past or anticipated interventions, if any, per neurosurgery:  03/26/2020 Under care of Dr. 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 No  Pacemaker/ICD? No  Possible current pregnancy? No  Is the patient on methotrexate? No  Additional Complaints / other details: Patient denies

## 2020-05-04 ENCOUNTER — Encounter: Payer: Medicare HMO | Admitting: Gastroenterology

## 2020-05-04 ENCOUNTER — Ambulatory Visit
Admission: RE | Admit: 2020-05-04 | Discharge: 2020-05-04 | Disposition: A | Discharge status: Home / Self Care | Payer: Medicare HMO | Source: Ambulatory Visit | Attending: Radiation Oncology | Admitting: Radiation Oncology

## 2020-05-04 ENCOUNTER — Encounter: Payer: Self-pay | Admitting: Radiation Oncology

## 2020-05-04 ENCOUNTER — Other Ambulatory Visit: Payer: Self-pay

## 2020-05-04 VITALS — BP 177/97 | HR 91 | Temp 97.7°F | Resp 18 | Ht 67.0 in | Wt 213.4 lb

## 2020-05-04 DIAGNOSIS — R891 Abnormal level of hormones in specimens from other organs, systems and tissues: Secondary | ICD-10-CM | POA: Diagnosis not present

## 2020-05-04 DIAGNOSIS — D352 Benign neoplasm of pituitary gland: Secondary | ICD-10-CM

## 2020-05-04 LAB — BUN & CREATININE (CHCC)
BUN: 15 mg/dL (ref 8–23)
Creatinine: 1.6 mg/dL — ABNORMAL HIGH (ref 0.44–1.00)
GFR, Est AFR Am: 40 mL/min — ABNORMAL LOW (ref >60)
GFR, Estimated: 34 mL/min — ABNORMAL LOW (ref >60)

## 2020-05-04 MED ORDER — SODIUM CHLORIDE 0.9% FLUSH
10.0000 mL | Freq: Once | INTRAVENOUS | Status: AC
  Administered 2020-05-04: 10 mL via INTRAVENOUS

## 2020-05-04 NOTE — Progress Notes (Addendum)
Radiation Oncology         [phone] ________________________________  Initial outpatient Consultation  Name: [name] MRN: [id number]  Date: 05/04/2020  DOB: 12/18/1955  YI:RSWN, Danielle Sleight, MD  [name], MD   REFERRING PHYSICIAN: [name], MD  DIAGNOSIS:    ICD-10-CM   1. Benign neoplasm of pituitary gland (HCC)  D35.2   2. 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 Pituitary adenoma ([place])  D35.2     CHIEF COMPLAINT: Here to discuss management of pituitary tumor  HISTORY OF PRESENT ILLNESS::Danielle Schroeder is a 64 y.o. female who presented with a growth hormone producing pituitary adenoma many years ago.  According to endocrinology notes this is probably around 37.  The patient reports that she received injections on about 5 occasions for the tumor.  She has not received any treatment since then.  She denies any medication management otherwise, nor any surgery, nor any radiotherapy.   She has been followed by endocrinology, Dr. Loanne Drilling.  She was lost to follow-up with neurosurgery.  She is receiving thyroid supplementation through Dr. Loanne Drilling.  Apparently her growth hormone lab work was high per his notes though I do not have the precise results available.    The was discussed at tumor board and the consensus was to proceed with radiation oncology consultation.  According to Dr. Venetia Constable, who saw her from neurosurgery, he did not feel that repeat surgery was feasible given the cavernous sinus involvement.  He is almost certain that she previously had some type of endonasal surgery based on imaging.  Most recent MRI was performed last week.  This reveals a 1.8 x 2.3cm mass, in the sella, anterior suprasellar cistern, and sphenoid sinus.  The optic chiasm and prechiasmatic optic nerves appear mildly retracted inferiorly.  There appears to be involvement of the left cavernous sinus.  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 No  Pacemaker/ICD? No  Possible current pregnancy? No  Is the patient on methotrexate? No  Additional Complaints / other details: Patient denies  She has CHF and diabetes.  She reports that she has a cardiologist.  She is here with her daughter.  She has not been vaccinated for Covid.  She denies any recent visual changes.    PREVIOUS RADIATION THERAPY: No  PAST MEDICAL HISTORY:  has a past medical history of Cataract, CHF (congestive heart failure) ([place]), Diabetes mellitus without complication ([place]), Hyperlipidemia, Hypertension, Pituitary adenoma with extrasellar extension ([place]), Sleep apnea, and Thyroid disease.    PAST SURGICAL HISTORY: Past Surgical History:  Procedure Laterality Date  . BREAST SURGERY    . CHOLECYSTECTOMY    . TONSILLECTOMY    . TUBAL LIGATION      FAMILY HISTORY: family history is not on file.  SOCIAL HISTORY:  reports that she has never smoked. She has never used smokeless tobacco. She reports that she does not drink alcohol and does not use drugs.  ALLERGIES: Patient has no known allergies.  MEDICATIONS:  Current Outpatient Medications  Medication Sig Dispense Refill  . amLODipine (NORVASC) 10 MG tablet Take 10 mg by mouth daily.    Marland Kitchen aspirin EC 81 MG tablet Take 81 mg by mouth daily.    Marland Kitchen  carvedilol (COREG) 25 MG tablet Take 25 mg by mouth 2 (two) times daily with a meal.    . furosemide (LASIX) 40 MG tablet Take 1 tablet (40 mg total) by mouth 2 (two) times daily. 180 tablet 1  . glucose blood test strip 1 each by Other route 3 (three) times daily. Use as instructed    . ibuprofen (ADVIL) 800 MG tablet Take 800 mg by mouth every 6 (six) hours.    . Insulin Glargine (BASAGLAR  KWIKPEN [place]) Inject into the skin. 35 units BID    . Insulin Pen Needle (PEN NEEDLES) 31G X 5 MM MISC 1 each by Does not apply route daily.    Marland Kitchen levothyroxine (SYNTHROID) 75 MCG tablet Take 1 tablet (75 mcg total) by mouth daily before breakfast. 30 tablet 3  . losartan (COZAAR) 100 MG tablet Take 100 mg by mouth daily.    . metFORMIN (GLUCOPHAGE) 1000 MG tablet Take 1,000 mg by mouth 2 (two) times daily with a meal.    . potassium chloride (KLOR-CON) 10 MEQ tablet Take 1 tablet (10 mEq total) by mouth daily. 90 tablet 1  . simvastatin (ZOCOR) 20 MG tablet Take 20 mg by mouth daily.    . TRADJENTA 5 MG TABS tablet Take 5 mg by mouth daily.     No current facility-administered medications for this encounter.    REVIEW OF SYSTEMS:  Notable for that above.   PHYSICAL EXAM:  height is 5\' 7"  (1.702 m) and weight is 213 lb 6 oz (96.8 kg). Her temporal temperature is 97.7 F (36.5 C). Her blood pressure is 177/97 (abnormal) and her pulse is 91. Her respiration is 18 and oxygen saturation is 97%.   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   Heart: Regular in rate and rhythm  Chest: Clear to auscultation bilaterally, with no rhonchi, wheezes, or rales. Extremities: Lower extremity pitting edema Musculoskeletal: symmetric strength and muscle tone throughout. Neurologic:  No obvious focalities. Speech is fluent. Coordination is intact.  In the right upper quadrant, peripherally, there is a slight visual deficit for both eyes Psychiatric: Judgment and insight are intact. Affect is appropriate.    LABORATORY DATA:  Lab Results  Component Value Date   WBC 4.8 03/30/2020   HGB 10.7 (L) 03/30/2020   HCT 32.4 (L) 03/30/2020   MCV 86 03/30/2020   PLT 188 03/30/2020   CMP     Component Value Date/Time   NA 142 03/30/2020 1209   K 4.1 03/30/2020 1209   CL 99 03/30/2020 1209   CO2 24 03/30/2020 1209    GLUCOSE 143 (H) 03/30/2020 1209   BUN 15 05/04/2020 0807   BUN 26 03/30/2020 1209   CREATININE 1.60 (H) 05/04/2020 0807   CALCIUM 10.1 03/30/2020 1209   PROT 7.0 01/14/2020 1439   ALBUMIN 4.0 01/14/2020 1439   AST 20 01/14/2020 1439   ALT 20 01/14/2020 1439   ALKPHOS 132 (H) 01/14/2020 1439   BILITOT 0.3 01/14/2020 1439   GFRNONAA 34 (L) 05/04/2020 0807   GFRAA 40 (L) 05/04/2020 0807         RADIOGRAPHY: MR Brain W Wo Contrast  Result Date: 04/29/2020 CLINICAL DATA:  Follow-up pituitary adenoma.  SRS planning. EXAM: MRI HEAD WITHOUT AND WITH CONTRAST TECHNIQUE: Multiplanar, multiecho pulse sequences of the brain and surrounding structures were obtained without and with intravenous contrast. CONTRAST:  65mL MULTIHANCE GADOBENATE DIMEGLUMINE 529 MG/ML  IV SOLN COMPARISON:  11/05/2019 FINDINGS: Brain: Dedicated pituitary imaging demonstrates a chronically abnormal appearance of the sella including the anterior sellar floor, however by history there has been no previous surgery. Heterogeneously hypoenhancing soft tissue within the midline and left lateral aspects of the sella is unchanged, measuring 1.8 cm in transverse diameter and with apparent involvement of the left cavernous sinus (series 18, image 6). Similarly hypoenhancing soft tissue extending into the anterior aspect of the suprasellar cistern measures 2.3 cm in craniocaudal dimension, also unchanged (series 17, image 7). Cystic foci are again noted in the left aspect of the sella. There is also unchanged hypoenhancing soft tissue extending through a defect in the anterior sellar floor into the sphenoid sinus (series 17, image 10). There is unchanged thickening of the pituitary infundibulum which is deviated rightward (series 8, image 4). The optic chiasm and prechiasmatic optic nerves appear mildly retracted inferiorly. The right cavernous sinus is unremarkable. No acute infarct, midline shift, or extra-axial fluid collection is identified.  The ventricles and sulci are normal. T2 hyperintensities in the cerebral white matter bilaterally are unchanged and nonspecific but compatible with mild chronic small vessel ischemic disease. Prominent susceptibility artifact and intrinsic T1 hyperintensity are again seen in the basal ganglia and deep cerebellar hemispheres bilaterally with at most faint enhancement, unchanged. Associated T2 hyperintensity/gliosis in both cerebellar hemispheres is also unchanged. Vascular: Major intracranial vascular flow voids are preserved. Skull and upper cervical spine: Unchanged nonspecific 1.2 cm T1 and T2 hypointense focus in the left parietal bone. Sinuses/Orbits: Bilateral cataract extraction. Minimal left maxillary sinus mucosal thickening, decreased from prior. Clear mastoid air cells. Other: None. IMPRESSION: 1. Unchanged residual pituitary adenoma in the sella, anterior suprasellar cistern, and sphenoid sinus. 2. Unchanged prominent mineralization in the basal ganglia and cerebellum. Electronically Signed   By: Logan Bores M.D.   On: 04/29/2020 15:36      IMPRESSION/PLAN: Today, I talked to the patient about the findings and work-up thus far. We discussed the patient's diagnosis of pituitary adenoma, growth hormone secreting, and general treatment for this, highlighting the role of radiotherapy in the management. We discussed the available radiation techniques, and focused on the details of logistics and delivery.    We discussed the risks, benefits, and side effects of radiotherapy.  She understands that she may need as many as 6 weeks of radiotherapy.  I will need to discuss her treatment volumes with a neuro radiologist in detail to verify what fractionation is safest to minimize her risk of side effects.  Side effects may include but not necessarily be limited to: Fatigue, headache, pituitary dysfunction, rare injury to the brain, rare visual loss, cognitive changes that can be chronic. No guarantees of  treatment were given. A consent form was signed and placed in the patient's medical record.  The patient was encouraged to ask questions that I answered to the best of my ability.   We will proceed with CT simulation and start her treatment in about a week.  If possible, we will consider 5 fraction stereotactic radiosurgery and otherwise consider a more fractionated approach over 6 weeks if necessary.  We discussed measures to reduce the risk of infection during the COVID-19 pandemic.  I recommended that she receive the vaccine, but she is not comfortable proceeding at this point in time.  I encouraged her to let me know if she will reconsider or has questions.  She understands importance of continuing to follow-up with her endocrinologist as hormone supplementation and continuous  testing is very important.    On date of service, in total, I spent 60 minutes on this encounter.  This included reviewing her imaging, past records, and meeting with the patient and her daughter.   Addendum: Labs obtained and MRI reviewed with radiologist in detail after consult. Given the elevated IGF-1 of 816 in Nov 2020, I will push the dose a bit more than usual, and due to tumor proximity to the optic structures, will need to fractionate (54Gy/30 fx).   Dr. Loanne Drilling and I exchanged notes - he will call the patient back in to start medication for her tumor after RT is complete. Since this is still an active tumor, I believe her labs will normalize faster with post-radiation medication as opposed to radiation alone.  __________________________________________   Eppie Gibson, MD

## 2020-05-04 NOTE — Progress Notes (Signed)
Has armband been applied?  Yes.    Does patient have an allergy to IV contrast dye?: No.   Has patient ever received premedication for IV contrast dye?: No.   Does patient take metformin?: Yes.    If patient does take metformin when was the last dose: May 03, 2020  Date of lab work: May 04, 2020 BUN: 15 CR: 1.60  IV site: hand right, condition patent and no redness and left, condition patent and no redness  Has IV site been added to flowsheet?  Yes.    BP (!) 177/97 (BP Location: Left Arm, Patient Position: Sitting)   Pulse 91   Temp 97.7 F (36.5 C) (Temporal)   Resp 18   Ht 5\' 7"  (1.702 m)   Wt 213 lb 6 oz (96.8 kg)   SpO2 97%   BMI 33.42 kg/m

## 2020-05-06 ENCOUNTER — Telehealth: Payer: Self-pay

## 2020-05-06 ENCOUNTER — Other Ambulatory Visit: Payer: Self-pay

## 2020-05-06 ENCOUNTER — Ambulatory Visit
Admission: RE | Admit: 2020-05-06 | Discharge: 2020-05-06 | Disposition: A | Discharge status: Home / Self Care | Payer: Medicare HMO | Source: Ambulatory Visit | Attending: Radiation Oncology | Admitting: Radiation Oncology

## 2020-05-06 DIAGNOSIS — D352 Benign neoplasm of pituitary gland: Secondary | ICD-10-CM | POA: Diagnosis not present

## 2020-05-06 LAB — BUN & CREATININE (CHCC)
BUN: 14 mg/dL (ref 8–23)
Creatinine: 1.44 mg/dL — ABNORMAL HIGH (ref 0.44–1.00)
GFR, Est AFR Am: 45 mL/min — ABNORMAL LOW (ref >60)
GFR, Estimated: 39 mL/min — ABNORMAL LOW (ref >60)

## 2020-05-06 NOTE — Telephone Encounter (Signed)
Patient's BUN and creatinine slightly better than before CT simulation. Dr. Isidore Moos out of office today, so per Waymond Cera: OK to resume Metformin. Called patient but did not get an answer and unable to leave a VM. Called patient's daughter Phineas Real and informed her patient could resume medication. Phineas Real stated she would get in contact with patient and let her know. No other needs identified at this time.

## 2020-05-07 ENCOUNTER — Telehealth: Payer: Self-pay | Admitting: Endocrinology

## 2020-05-07 ENCOUNTER — Telehealth: Payer: Self-pay

## 2020-05-07 NOTE — Telephone Encounter (Signed)
Per Dr. Isidore Moos and Dr. Loanne Drilling, pt was called to schedule f/u appt. Pt scheduled 05/11/20 @ 830am

## 2020-05-07 NOTE — Addendum Note (Signed)
Encounter addended by: Eppie Gibson, MD on: 05/07/2020 3:46 PM  Actions taken: Clinical Note Signed

## 2020-05-07 NOTE — Telephone Encounter (Signed)
Danielle Schroeder from [facility] called requesting any recent lab results that we've done in our office because the Dr over there is unable to see it in [place]. She said she will fax over a request.  Fax# [phone]

## 2020-05-07 NOTE — Telephone Encounter (Signed)
-----   Message from Renato Shin, MD sent at 05/07/2020  3:05 PM EDT ----- please contact patient: Needs f/u next available. ----- Message ----- From: Eppie Gibson, MD Sent: 05/07/2020   2:57 PM EDT To: Renato Shin, MD, [name], MD, #  Hi Sean (and team), I am starting RT soon for Ms. Strain's pituitary adenoma.  Giving the elevated IGF-1 I will push the dose a bit more than usual, and due to proximity to the optic structures, will need to fractionate (54Gy/30 fx). She was not a surgical candidate.  Hilliard Clark, can you start medication (whatever you recommend --- not sure if Somavert is your tx of choice or something else - about a month after she completes RT?)  Since this is still an active tumor, I believe her labs will normalize faster with post-radiation medication management, as long as you see fit.  Please let me know your thoughts. Thanks, Judson Roch

## 2020-05-07 NOTE — Telephone Encounter (Signed)
Pt has not had any recent labs since 11/2019. [facility] should have access to [facility] labs.

## 2020-05-07 NOTE — Telephone Encounter (Signed)
-----   Message from Eppie Gibson, MD sent at 05/07/2020  2:57 PM EDT ----- Danielle Schroeder (and team), I am starting RT soon for Danielle Schroeder's pituitary adenoma.  Giving the elevated IGF-1 I will push the dose a bit more than usual, and due to proximity to the optic structures, will need to fractionate (54Gy/30 fx). She was not a surgical candidate.  Danielle Schroeder, can you start medication (whatever you recommend --- not sure if Somavert is your tx of choice or something else - about a month after she completes RT?)  Since this is still an active tumor, I believe her labs will normalize faster with post-radiation medication management, as long as you see fit.  Please let me know your thoughts. Thanks, Judson Roch

## 2020-05-10 ENCOUNTER — Telehealth: Payer: Self-pay | Admitting: Family Medicine

## 2020-05-10 NOTE — Telephone Encounter (Signed)
Marcine Matar is calling from the cancer center asking if Dr.Ellison received the message from Dr.Squire about starting new medication for her adenoma.

## 2020-05-10 NOTE — Telephone Encounter (Signed)
As per 05/07/20 encounter, pt is scheduled as follows:  Next Appt With Internal Medicine Renato Shin, MD) 05/11/2020 at 8:30 AM  As indicated in the notes of the appt, Dr. Isidore Moos request will be addressed during this appt. Refer to correspondence below:  From 05/07/20 encounter:  Conversation: Appointment (Newest Message First) Me to Renato Shin, MD . Eppie Gibson, MD     05/07/20 3:12 PM FYI  Me     05/07/20 3:12 PM Note Per Dr. Isidore Moos and Dr. Loanne Drilling, pt was called to schedule f/u appt. Pt scheduled 05/11/20 @ 830am        05/07/20 3:11 PM You attempted to contact [place], [place] (Completed)   Me     05/07/20 3:11 PM Note ----- Message from Renato Shin, MD sent at 05/07/2020  3:05 PM EDT ----- please contact patient: Needs f/u next available. ----- Message ----- From: Eppie Gibson, MD Sent: 05/07/2020   2:57 PM EDT To: Renato Shin, MD, [name], MD, #  Hi Sean (and team), I am starting RT soon for Ms. Snavely's pituitary adenoma.  Giving the elevated IGF-1 I will push the dose a bit more than usual, and due to proximity to the optic structures, will need to fractionate (54Gy/30 fx). She was not a surgical candidate.  Hilliard Clark, can you start medication (whatever you recommend --- not sure if Somavert is your tx of choice or something else - about a month after she completes RT?)  Since this is still an active tumor, I believe her labs will normalize faster with post-radiation medication management, as long as you see fit.  Please let me know your thoughts. Thanks, Judson Roch

## 2020-05-11 ENCOUNTER — Other Ambulatory Visit: Payer: Self-pay

## 2020-05-11 ENCOUNTER — Ambulatory Visit (INDEPENDENT_AMBULATORY_CARE_PROVIDER_SITE_OTHER): Payer: Medicare HMO | Admitting: Endocrinology

## 2020-05-11 ENCOUNTER — Encounter: Payer: Self-pay | Admitting: Endocrinology

## 2020-05-11 ENCOUNTER — Ambulatory Visit: Payer: Medicare HMO | Admitting: Radiation Oncology

## 2020-05-11 VITALS — BP 120/70 | HR 87 | Ht 67.0 in | Wt 211.6 lb

## 2020-05-11 DIAGNOSIS — D352 Benign neoplasm of pituitary gland: Secondary | ICD-10-CM | POA: Diagnosis not present

## 2020-05-11 NOTE — Progress Notes (Signed)
Subjective:    Patient ID: Danielle Schroeder, female    DOB: 01/08/1956, 64 y.o.   MRN: [id number]  HPI Pt returns for f/u of GH-producing pituitary adenoma (dx'ed at uncertain time, but was prob approx 1990; she was she took some injections for this x a few mos, but she has been on no rx since then; she never had pituitary surgery; NS recommended XRT, which will start tomorrow).   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   GH: high.  Prolactin: normal, even with dilution.   FSH/LH: low ACTH/cortisol: random are normal.  VP: low (with normal Na+) A-subunit: normal.  She also has small MNG (dx'ed 2020; no nodule needed bx; plan is for f/u US). pt states she feels well in general.   Past Medical History:  Diagnosis Date  . Cataract    bilateral lens implants  . CHF (congestive heart failure) ([place])   . Diabetes mellitus without complication ([place])   . Hyperlipidemia   . Hypertension   . Pituitary adenoma with extrasellar extension ([place])   . Sleep apnea    no c-pap  . Thyroid disease    hypothyroidism    Past Surgical History:  Procedure Laterality Date  . BREAST SURGERY    . CHOLECYSTECTOMY    . TONSILLECTOMY    . TUBAL LIGATION      Social History   Socioeconomic History  . Marital status: Single    Spouse name: Not on file  . Number of children: Not on file  . Years of education: Not on file  . Highest education level: Not on file  Occupational History  . Not on file  Tobacco Use  . Smoking status: Never Smoker  . Smokeless tobacco: Never Used  Substance and Sexual Activity  . Alcohol use: Never  . Drug use: Never  . Sexual activity: Not Currently  Other Topics Concern  . Not on file  Social History Narrative  . Not on file   Social Determinants of Health   Financial Resource Strain:   . Difficulty of Paying Living Expenses:   Food Insecurity:   . Worried About Charity fundraiser in the Last  Year:   . Arboriculturist in the Last Year:   Transportation Needs:   . Film/video editor (Medical):   Marland Kitchen Lack of Transportation (Non-Medical):   Physical Activity:   . Days of Exercise per Week:   . Minutes of Exercise per Session:   Stress:   . Feeling of Stress :   Social Connections:   . Frequency of Communication with Friends and Family:   . Frequency of Social Gatherings with Friends and Family:   . Attends Religious Services:   . Active Member of Clubs or Organizations:   . Attends Archivist Meetings:   Marland Kitchen Marital Status:   Intimate Partner Violence:   . Fear of Current or Ex-Partner:   . Emotionally Abused:   Marland Kitchen Physically Abused:   . Sexually Abused:     Current Outpatient Medications on File Prior to Visit  Medication Sig Dispense Refill  . amLODipine (NORVASC) 10 MG tablet Take 10 mg by mouth daily.    Marland Kitchen aspirin EC 81 MG tablet Take 81 mg by mouth daily.    . carvedilol (COREG) 25 MG tablet Take 25 mg by mouth 2 (two) times daily with a meal.    . furosemide (LASIX) 40  MG tablet Take 1 tablet (40 mg total) by mouth 2 (two) times daily. 180 tablet 1  . glucose blood test strip 1 each by Other route 3 (three) times daily. Use as instructed    . ibuprofen (ADVIL) 800 MG tablet Take 800 mg by mouth every 6 (six) hours.    . Insulin Glargine (BASAGLAR KWIKPEN [place]) Inject into the skin. 35 units BID    . Insulin Pen Needle (PEN NEEDLES) 31G X 5 MM MISC 1 each by Does not apply route daily.    Marland Kitchen levothyroxine (SYNTHROID) 75 MCG tablet Take 1 tablet (75 mcg total) by mouth daily before breakfast. 30 tablet 3  . losartan (COZAAR) 100 MG tablet Take 100 mg by mouth daily.    . metFORMIN (GLUCOPHAGE) 1000 MG tablet Take 1,000 mg by mouth 2 (two) times daily with a meal.    . potassium chloride (KLOR-CON) 10 MEQ tablet Take 1 tablet (10 mEq total) by mouth daily. 90 tablet 1  . simvastatin (ZOCOR) 20 MG tablet Take 20 mg by mouth daily.    . TRADJENTA 5 MG TABS  tablet Take 5 mg by mouth daily.     No current facility-administered medications on file prior to visit.    No Known Allergies  Family History  Problem Relation Age of Onset  . Thyroid disease Neg Hx   . Colon cancer Neg Hx   . Esophageal cancer Neg Hx   . Rectal cancer Neg Hx   . Stomach cancer Neg Hx     BP 120/70   Pulse 87   Ht 5\' 7"  (1.702 m)   Wt 211 lb 9.6 oz (96 kg)   SpO2 92%   BMI 33.14 kg/m    Review of Systems     Objective:   Physical Exam VITAL SIGNS:  See vs page GENERAL: no distress NECK: There is no palpable thyroid enlargement.  No thyroid nodule is palpable.  No palpable lymphadenopathy at the anterior neck.   Lab Results  Component Value Date   TSH 0.36 11/19/2019      Assessment & Plan:  GH-producing pituitary adenoma.  We discussed plan of treatment.  We'll reassess after XRT, with plans for GH-suppressive rx.  Hypothyroidism: well-replaced.  Patient Instructions  Please continue the same medication.  Please come back for a follow-up appointment in 4 weeks, when we'll plan to recheck the pituitary hormones.   Please call sooner if you feel much different during the radiation treatment.

## 2020-05-11 NOTE — Patient Instructions (Addendum)
Please continue the same medication.  Please come back for a follow-up appointment in 4 weeks, when we'll plan to recheck the pituitary hormones.   Please call sooner if you feel much different during the radiation treatment.

## 2020-05-12 ENCOUNTER — Ambulatory Visit
Admission: RE | Admit: 2020-05-12 | Discharge: 2020-05-12 | Disposition: A | Discharge status: Home / Self Care | Payer: Medicare HMO | Source: Ambulatory Visit | Attending: Radiation Oncology | Admitting: Radiation Oncology

## 2020-05-12 ENCOUNTER — Other Ambulatory Visit: Payer: Self-pay

## 2020-05-12 DIAGNOSIS — D352 Benign neoplasm of pituitary gland: Secondary | ICD-10-CM | POA: Diagnosis not present

## 2020-05-13 ENCOUNTER — Ambulatory Visit
Admission: RE | Admit: 2020-05-13 | Discharge: 2020-05-13 | Disposition: A | Discharge status: Home / Self Care | Payer: Medicare Other | Source: Ambulatory Visit | Attending: Radiation Oncology | Admitting: Radiation Oncology

## 2020-05-13 ENCOUNTER — Other Ambulatory Visit: Payer: Self-pay

## 2020-05-13 DIAGNOSIS — D352 Benign neoplasm of pituitary gland: Secondary | ICD-10-CM | POA: Diagnosis present

## 2020-05-14 ENCOUNTER — Ambulatory Visit: Payer: Medicare Other | Admitting: Radiation Oncology

## 2020-05-14 ENCOUNTER — Other Ambulatory Visit: Payer: Self-pay

## 2020-05-14 ENCOUNTER — Ambulatory Visit
Admission: RE | Admit: 2020-05-14 | Discharge: 2020-05-14 | Disposition: A | Discharge status: Home / Self Care | Payer: Medicare Other | Source: Ambulatory Visit | Attending: Radiation Oncology | Admitting: Radiation Oncology

## 2020-05-14 DIAGNOSIS — D352 Benign neoplasm of pituitary gland: Secondary | ICD-10-CM | POA: Diagnosis not present

## 2020-05-18 ENCOUNTER — Other Ambulatory Visit: Payer: Self-pay

## 2020-05-18 ENCOUNTER — Ambulatory Visit: Payer: Medicare Other | Admitting: Radiation Oncology

## 2020-05-18 ENCOUNTER — Ambulatory Visit
Admission: RE | Admit: 2020-05-18 | Discharge: 2020-05-18 | Disposition: A | Discharge status: Home / Self Care | Payer: Medicare Other | Source: Ambulatory Visit | Attending: Radiation Oncology | Admitting: Radiation Oncology

## 2020-05-18 DIAGNOSIS — D352 Benign neoplasm of pituitary gland: Secondary | ICD-10-CM | POA: Diagnosis not present

## 2020-05-18 NOTE — Progress Notes (Signed)
Pt here for patient teaching.  Pt given Radiation and You booklet and skin care instructions.  Reviewed areas of pertinence such as fatigue, hair loss, nausea and vomiting, skin changes, headache, blurry vision and taste changes . Pt able to give teach back of to pat skin and use unscented/gentle soap,. Pt demonstrated understanding, of information given and will contact nursing with any questions or concerns.     [email or web address]

## 2020-05-19 ENCOUNTER — Ambulatory Visit
Admission: RE | Admit: 2020-05-19 | Discharge: 2020-05-19 | Disposition: A | Discharge status: Home / Self Care | Payer: Medicare Other | Source: Ambulatory Visit | Attending: Radiation Oncology | Admitting: Radiation Oncology

## 2020-05-19 ENCOUNTER — Other Ambulatory Visit: Payer: Self-pay

## 2020-05-19 DIAGNOSIS — D352 Benign neoplasm of pituitary gland: Secondary | ICD-10-CM | POA: Diagnosis not present

## 2020-05-20 ENCOUNTER — Ambulatory Visit
Admission: RE | Admit: 2020-05-20 | Discharge: 2020-05-20 | Disposition: A | Discharge status: Home / Self Care | Payer: Medicare Other | Source: Ambulatory Visit | Attending: Radiation Oncology | Admitting: Radiation Oncology

## 2020-05-20 ENCOUNTER — Other Ambulatory Visit: Payer: Self-pay

## 2020-05-20 DIAGNOSIS — D352 Benign neoplasm of pituitary gland: Secondary | ICD-10-CM | POA: Diagnosis not present

## 2020-05-21 ENCOUNTER — Ambulatory Visit: Payer: Medicare Other | Admitting: Radiation Oncology

## 2020-05-21 ENCOUNTER — Ambulatory Visit
Admission: RE | Admit: 2020-05-21 | Discharge: 2020-05-21 | Disposition: A | Discharge status: Home / Self Care | Payer: Medicare Other | Source: Ambulatory Visit | Attending: Radiation Oncology | Admitting: Radiation Oncology

## 2020-05-21 ENCOUNTER — Other Ambulatory Visit: Payer: Self-pay

## 2020-05-21 DIAGNOSIS — D352 Benign neoplasm of pituitary gland: Secondary | ICD-10-CM | POA: Diagnosis not present

## 2020-05-23 ENCOUNTER — Ambulatory Visit: Admission: RE | Admit: 2020-05-23 | Payer: Medicare Other | Source: Ambulatory Visit

## 2020-05-23 ENCOUNTER — Ambulatory Visit: Payer: Medicare Other

## 2020-05-24 ENCOUNTER — Ambulatory Visit: Payer: Medicare Other | Admitting: Radiation Oncology

## 2020-05-24 ENCOUNTER — Other Ambulatory Visit: Payer: Self-pay

## 2020-05-24 ENCOUNTER — Ambulatory Visit: Payer: Medicare Other

## 2020-05-24 ENCOUNTER — Ambulatory Visit
Admission: RE | Admit: 2020-05-24 | Discharge: 2020-05-24 | Disposition: A | Discharge status: Home / Self Care | Payer: Medicare Other | Source: Ambulatory Visit | Attending: Radiation Oncology | Admitting: Radiation Oncology

## 2020-05-24 DIAGNOSIS — D352 Benign neoplasm of pituitary gland: Secondary | ICD-10-CM | POA: Diagnosis not present

## 2020-05-25 ENCOUNTER — Ambulatory Visit
Admission: RE | Admit: 2020-05-25 | Discharge: 2020-05-25 | Disposition: A | Discharge status: Home / Self Care | Payer: Medicare Other | Source: Ambulatory Visit | Attending: Radiation Oncology | Admitting: Radiation Oncology

## 2020-05-25 ENCOUNTER — Other Ambulatory Visit: Payer: Self-pay

## 2020-05-25 DIAGNOSIS — D352 Benign neoplasm of pituitary gland: Secondary | ICD-10-CM | POA: Diagnosis not present

## 2020-05-26 ENCOUNTER — Other Ambulatory Visit: Payer: Self-pay

## 2020-05-26 ENCOUNTER — Ambulatory Visit
Admission: RE | Admit: 2020-05-26 | Discharge: 2020-05-26 | Disposition: A | Discharge status: Home / Self Care | Payer: Medicare Other | Source: Ambulatory Visit | Attending: Radiation Oncology | Admitting: Radiation Oncology

## 2020-05-26 DIAGNOSIS — D352 Benign neoplasm of pituitary gland: Secondary | ICD-10-CM | POA: Diagnosis not present

## 2020-05-27 ENCOUNTER — Other Ambulatory Visit: Payer: Self-pay

## 2020-05-27 ENCOUNTER — Ambulatory Visit
Admission: RE | Admit: 2020-05-27 | Discharge: 2020-05-27 | Disposition: A | Discharge status: Home / Self Care | Payer: Medicare Other | Source: Ambulatory Visit | Attending: Radiation Oncology | Admitting: Radiation Oncology

## 2020-05-27 DIAGNOSIS — D352 Benign neoplasm of pituitary gland: Secondary | ICD-10-CM | POA: Diagnosis not present

## 2020-05-28 ENCOUNTER — Ambulatory Visit
Admission: RE | Admit: 2020-05-28 | Discharge: 2020-05-28 | Disposition: A | Discharge status: Home / Self Care | Payer: Medicare Other | Source: Ambulatory Visit | Attending: Radiation Oncology | Admitting: Radiation Oncology

## 2020-05-28 ENCOUNTER — Other Ambulatory Visit: Payer: Self-pay

## 2020-05-28 DIAGNOSIS — D352 Benign neoplasm of pituitary gland: Secondary | ICD-10-CM | POA: Diagnosis not present

## 2020-05-31 ENCOUNTER — Ambulatory Visit
Admission: RE | Admit: 2020-05-31 | Discharge: 2020-05-31 | Disposition: A | Discharge status: Home / Self Care | Payer: Medicare Other | Source: Ambulatory Visit | Attending: Radiation Oncology | Admitting: Radiation Oncology

## 2020-05-31 ENCOUNTER — Other Ambulatory Visit: Payer: Self-pay

## 2020-05-31 DIAGNOSIS — D352 Benign neoplasm of pituitary gland: Secondary | ICD-10-CM | POA: Diagnosis not present

## 2020-06-01 ENCOUNTER — Ambulatory Visit
Admission: RE | Admit: 2020-06-01 | Discharge: 2020-06-01 | Disposition: A | Discharge status: Home / Self Care | Payer: Medicare Other | Source: Ambulatory Visit | Attending: Radiation Oncology | Admitting: Radiation Oncology

## 2020-06-01 ENCOUNTER — Other Ambulatory Visit: Payer: Self-pay

## 2020-06-01 DIAGNOSIS — D352 Benign neoplasm of pituitary gland: Secondary | ICD-10-CM | POA: Diagnosis not present

## 2020-06-02 ENCOUNTER — Other Ambulatory Visit: Payer: Self-pay

## 2020-06-02 ENCOUNTER — Ambulatory Visit
Admission: RE | Admit: 2020-06-02 | Discharge: 2020-06-02 | Disposition: A | Discharge status: Home / Self Care | Payer: Medicare Other | Source: Ambulatory Visit | Attending: Radiation Oncology | Admitting: Radiation Oncology

## 2020-06-02 DIAGNOSIS — D352 Benign neoplasm of pituitary gland: Secondary | ICD-10-CM | POA: Diagnosis not present

## 2020-06-03 ENCOUNTER — Ambulatory Visit
Admission: RE | Admit: 2020-06-03 | Discharge: 2020-06-03 | Disposition: A | Discharge status: Home / Self Care | Payer: Medicare Other | Source: Ambulatory Visit | Attending: Radiation Oncology | Admitting: Radiation Oncology

## 2020-06-03 ENCOUNTER — Other Ambulatory Visit: Payer: Self-pay

## 2020-06-03 DIAGNOSIS — D352 Benign neoplasm of pituitary gland: Secondary | ICD-10-CM | POA: Diagnosis not present

## 2020-06-04 ENCOUNTER — Ambulatory Visit
Admission: RE | Admit: 2020-06-04 | Discharge: 2020-06-04 | Disposition: A | Discharge status: Home / Self Care | Payer: Medicare Other | Source: Ambulatory Visit | Attending: Radiation Oncology | Admitting: Radiation Oncology

## 2020-06-04 ENCOUNTER — Other Ambulatory Visit: Payer: Self-pay

## 2020-06-04 DIAGNOSIS — D352 Benign neoplasm of pituitary gland: Secondary | ICD-10-CM | POA: Diagnosis not present

## 2020-06-07 ENCOUNTER — Ambulatory Visit
Admission: RE | Admit: 2020-06-07 | Discharge: 2020-06-07 | Disposition: A | Discharge status: Home / Self Care | Payer: Medicare Other | Source: Ambulatory Visit | Attending: Radiation Oncology | Admitting: Radiation Oncology

## 2020-06-07 ENCOUNTER — Other Ambulatory Visit: Payer: Self-pay

## 2020-06-07 DIAGNOSIS — D352 Benign neoplasm of pituitary gland: Secondary | ICD-10-CM | POA: Diagnosis not present

## 2020-06-08 ENCOUNTER — Ambulatory Visit
Admission: RE | Admit: 2020-06-08 | Discharge: 2020-06-08 | Disposition: A | Discharge status: Home / Self Care | Payer: Medicare Other | Source: Ambulatory Visit | Attending: Radiation Oncology | Admitting: Radiation Oncology

## 2020-06-08 ENCOUNTER — Other Ambulatory Visit: Payer: Self-pay

## 2020-06-08 DIAGNOSIS — D352 Benign neoplasm of pituitary gland: Secondary | ICD-10-CM | POA: Diagnosis not present

## 2020-06-09 ENCOUNTER — Other Ambulatory Visit: Payer: Self-pay

## 2020-06-09 ENCOUNTER — Ambulatory Visit
Admission: RE | Admit: 2020-06-09 | Discharge: 2020-06-09 | Disposition: A | Discharge status: Home / Self Care | Payer: Medicare Other | Source: Ambulatory Visit | Attending: Radiation Oncology | Admitting: Radiation Oncology

## 2020-06-09 ENCOUNTER — Ambulatory Visit (INDEPENDENT_AMBULATORY_CARE_PROVIDER_SITE_OTHER): Payer: Medicare Other | Admitting: Endocrinology

## 2020-06-09 ENCOUNTER — Encounter: Payer: Self-pay | Admitting: Endocrinology

## 2020-06-09 VITALS — BP 110/70 | HR 89 | Ht 67.0 in | Wt 213.2 lb

## 2020-06-09 DIAGNOSIS — D352 Benign neoplasm of pituitary gland: Secondary | ICD-10-CM

## 2020-06-09 LAB — BASIC METABOLIC PANEL
BUN: 19 mg/dL (ref 6–23)
CO2: 30 mEq/L (ref 19–32)
Calcium: 10.1 mg/dL (ref 8.4–10.5)
Chloride: 102 mEq/L (ref 96–112)
Creatinine, Ser: 1.39 mg/dL — ABNORMAL HIGH (ref 0.40–1.20)
GFR: 46.36 mL/min — ABNORMAL LOW (ref >60.00)
Glucose, Bld: 199 mg/dL — ABNORMAL HIGH (ref 70–99)
Potassium: 4 mEq/L (ref 3.5–5.1)
Sodium: 139 mEq/L (ref 135–145)

## 2020-06-09 LAB — T4, FREE: Free T4: 1.03 ng/dL (ref 0.60–1.60)

## 2020-06-09 LAB — TSH: TSH: 0.09 u[IU]/mL — ABNORMAL LOW (ref 0.35–4.50)

## 2020-06-09 LAB — FOLLICLE STIMULATING HORMONE: FSH: 0.7 m[IU]/mL

## 2020-06-09 LAB — VITAMIN D 25 HYDROXY (VIT D DEFICIENCY, FRACTURES): VITD: 25.14 ng/mL — ABNORMAL LOW (ref 30.00–100.00)

## 2020-06-09 LAB — CORTISOL: Cortisol, Plasma: 10.2 ug/dL

## 2020-06-09 LAB — LUTEINIZING HORMONE: LH: 0.18 m[IU]/mL

## 2020-06-09 NOTE — Progress Notes (Signed)
Subjective:    Patient ID: Danielle Schroeder, female    DOB: 04/23/1956, 64 y.o.   MRN: [id number]  HPI Pt returns for f/u of GH-producing pituitary adenoma (dx'ed at uncertain time, but was prob approx 1990; she was she took some injections for this x a few mos, but she has been on no rx since then; she never had pituitary surgery; NS recommended XRT, which will start tomorrow).   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    GH: high.  Prolactin: normal, even with dilution.   FSH/LH: low ACTH/cortisol: random are normal.   VP: low (with normal Na+) A-subunit: normal.  She also has small MNG (dx'ed 2020; no nodule needed bx; plan is for f/u US).  pt states she feels well in general.  She has 2 more weeks of XRT scheduled.   Past Medical History:  Diagnosis Date  . Cataract    bilateral lens implants  . CHF (congestive heart failure) ([place])   . Diabetes mellitus without complication ([place])   . Hyperlipidemia   . Hypertension   . Pituitary adenoma with extrasellar extension ([place])   . Sleep apnea    no c-pap  . Thyroid disease    hypothyroidism    Past Surgical History:  Procedure Laterality Date  . BREAST SURGERY    . CHOLECYSTECTOMY    . TONSILLECTOMY    . TUBAL LIGATION      Social History   Socioeconomic History  . Marital status: Single    Spouse name: Not on file  . Number of children: Not on file  . Years of education: Not on file  . Highest education level: Not on file  Occupational History  . Not on file  Tobacco Use  . Smoking status: Never Smoker  . Smokeless tobacco: Never Used  Substance and Sexual Activity  . Alcohol use: Never  . Drug use: Never  . Sexual activity: Not Currently  Other Topics Concern  . Not on file  Social History Narrative  . Not on file   Social Determinants of Health   Financial Resource Strain:   . Difficulty of Paying Living Expenses:   Food Insecurity:   .  Worried About Charity fundraiser in the Last Year:   . Arboriculturist in the Last Year:   Transportation Needs:   . Film/video editor (Medical):   Marland Kitchen Lack of Transportation (Non-Medical):   Physical Activity:   . Days of Exercise per Week:   . Minutes of Exercise per Session:   Stress:   . Feeling of Stress :   Social Connections:   . Frequency of Communication with Friends and Family:   . Frequency of Social Gatherings with Friends and Family:   . Attends Religious Services:   . Active Member of Clubs or Organizations:   . Attends Archivist Meetings:   Marland Kitchen Marital Status:   Intimate Partner Violence:   . Fear of Current or Ex-Partner:   . Emotionally Abused:   Marland Kitchen Physically Abused:   . Sexually Abused:     Current Outpatient Medications on File Prior to Visit  Medication Sig Dispense Refill  . amLODipine (NORVASC) 10 MG tablet Take 10 mg by mouth daily.    Marland Kitchen aspirin EC 81 MG tablet Take 81 mg by mouth daily.    . carvedilol (COREG) 25 MG tablet Take 25 mg by mouth 2 (two)  times daily with a meal.    . furosemide (LASIX) 40 MG tablet Take 1 tablet (40 mg total) by mouth 2 (two) times daily. 180 tablet 1  . glucose blood test strip 1 each by Other route 3 (three) times daily. Use as instructed    . ibuprofen (ADVIL) 800 MG tablet Take 800 mg by mouth every 6 (six) hours.    . Insulin Glargine (BASAGLAR KWIKPEN [place]) Inject into the skin. 35 units BID    . Insulin Pen Needle (PEN NEEDLES) 31G X 5 MM MISC 1 each by Does not apply route daily.    Marland Kitchen levothyroxine (SYNTHROID) 75 MCG tablet Take 1 tablet (75 mcg total) by mouth daily before breakfast. 30 tablet 3  . losartan (COZAAR) 100 MG tablet Take 100 mg by mouth daily.    . metFORMIN (GLUCOPHAGE) 1000 MG tablet Take 1,000 mg by mouth 2 (two) times daily with a meal.    . potassium chloride (KLOR-CON) 10 MEQ tablet Take 1 tablet (10 mEq total) by mouth daily. 90 tablet 1  . simvastatin (ZOCOR) 20 MG tablet Take 20 mg  by mouth daily.    . TRADJENTA 5 MG TABS tablet Take 5 mg by mouth daily.     No current facility-administered medications on file prior to visit.    No Known Allergies  Family History  Problem Relation Age of Onset  . Thyroid disease Neg Hx   . Colon cancer Neg Hx   . Esophageal cancer Neg Hx   . Rectal cancer Neg Hx   . Stomach cancer Neg Hx     BP 110/70   Pulse 89   Ht 5\' 7"  (1.702 m)   Wt (!) 213 lb 3.2 oz (96.7 kg)   SpO2 91%   BMI 33.39 kg/m    Review of Systems Denies LOC.  She denies hypoglycemia    Objective:   Physical Exam    Lab Results  Component Value Date   TSH 0.09 (L) 06/09/2020  free T4=1.03    Assessment & Plan:  Central hypothyroidism: well-replaced.  GH-producing pituitary adenoma.  recheck labs today.   Patient Instructions  Blood tests are requested for you today.  We'll let you know about the results.  Please come back for a follow-up appointment in 3-4 weeks

## 2020-06-09 NOTE — Patient Instructions (Addendum)
Blood tests are requested for you today.  We'll let you know about the results.  Please come back for a follow-up appointment in 3-4 weeks

## 2020-06-10 ENCOUNTER — Ambulatory Visit
Admission: RE | Admit: 2020-06-10 | Discharge: 2020-06-10 | Disposition: A | Discharge status: Home / Self Care | Payer: Medicare Other | Source: Ambulatory Visit | Attending: Radiation Oncology | Admitting: Radiation Oncology

## 2020-06-10 ENCOUNTER — Other Ambulatory Visit: Payer: Self-pay

## 2020-06-10 DIAGNOSIS — D352 Benign neoplasm of pituitary gland: Secondary | ICD-10-CM | POA: Diagnosis not present

## 2020-06-11 ENCOUNTER — Other Ambulatory Visit: Payer: Self-pay

## 2020-06-11 ENCOUNTER — Ambulatory Visit
Admission: RE | Admit: 2020-06-11 | Discharge: 2020-06-11 | Disposition: A | Discharge status: Home / Self Care | Payer: Medicare Other | Source: Ambulatory Visit | Attending: Radiation Oncology | Admitting: Radiation Oncology

## 2020-06-11 DIAGNOSIS — D352 Benign neoplasm of pituitary gland: Secondary | ICD-10-CM | POA: Diagnosis not present

## 2020-06-14 ENCOUNTER — Ambulatory Visit
Admission: RE | Admit: 2020-06-14 | Discharge: 2020-06-14 | Disposition: A | Discharge status: Home / Self Care | Payer: Medicare HMO | Source: Ambulatory Visit | Attending: Radiation Oncology | Admitting: Radiation Oncology

## 2020-06-14 ENCOUNTER — Other Ambulatory Visit: Payer: Self-pay

## 2020-06-14 DIAGNOSIS — D352 Benign neoplasm of pituitary gland: Secondary | ICD-10-CM | POA: Insufficient documentation

## 2020-06-15 ENCOUNTER — Ambulatory Visit
Admission: RE | Admit: 2020-06-15 | Discharge: 2020-06-15 | Disposition: A | Discharge status: Home / Self Care | Payer: Medicare HMO | Source: Ambulatory Visit | Attending: Radiation Oncology | Admitting: Radiation Oncology

## 2020-06-15 ENCOUNTER — Other Ambulatory Visit: Payer: Self-pay

## 2020-06-15 DIAGNOSIS — D352 Benign neoplasm of pituitary gland: Secondary | ICD-10-CM | POA: Diagnosis not present

## 2020-06-15 LAB — PTH, INTACT AND CALCIUM
Calcium: 9.7 mg/dL (ref 8.6–10.4)
PTH: 70 pg/mL — ABNORMAL HIGH (ref 14–64)

## 2020-06-15 LAB — PROLACTIN: Prolactin: 2.4 ng/mL

## 2020-06-15 LAB — GROWTH HORMONE: Growth Hormone: 28.3 ng/mL — ABNORMAL HIGH (ref <=7.1)

## 2020-06-15 LAB — INSULIN-LIKE GROWTH FACTOR
IGF-I, LC/MS: 739 ng/mL — ABNORMAL HIGH (ref 41–279)
Z-Score (Female): 4.5 SD — ABNORMAL HIGH (ref ?–2.0)

## 2020-06-16 ENCOUNTER — Other Ambulatory Visit: Payer: Self-pay

## 2020-06-16 ENCOUNTER — Ambulatory Visit
Admission: RE | Admit: 2020-06-16 | Discharge: 2020-06-16 | Disposition: A | Discharge status: Home / Self Care | Payer: Medicare HMO | Source: Ambulatory Visit | Attending: Radiation Oncology | Admitting: Radiation Oncology

## 2020-06-16 DIAGNOSIS — D352 Benign neoplasm of pituitary gland: Secondary | ICD-10-CM | POA: Diagnosis not present

## 2020-06-17 ENCOUNTER — Telehealth: Payer: Self-pay

## 2020-06-17 ENCOUNTER — Other Ambulatory Visit: Payer: Self-pay

## 2020-06-17 ENCOUNTER — Ambulatory Visit
Admission: RE | Admit: 2020-06-17 | Discharge: 2020-06-17 | Disposition: A | Discharge status: Home / Self Care | Payer: Medicare HMO | Source: Ambulatory Visit | Attending: Radiation Oncology | Admitting: Radiation Oncology

## 2020-06-17 DIAGNOSIS — D352 Benign neoplasm of pituitary gland: Secondary | ICD-10-CM | POA: Diagnosis not present

## 2020-06-17 DIAGNOSIS — E559 Vitamin D deficiency, unspecified: Secondary | ICD-10-CM

## 2020-06-17 MED ORDER — VITAMIN D (CHOLECALCIFEROL) 25 MCG (1000 UT) PO CAPS: 1.0000 | ORAL_CAPSULE | Freq: Every day | ORAL | 0 refills | Status: DC

## 2020-06-17 NOTE — Telephone Encounter (Signed)
RESULTS  Results were reviewed by Dr. Loanne Drilling. Called pt to inform about results as well as new orders. Using closed-loop communication, pt verbalized complete acceptance and understanding of all information provided. No further questions nor concerns were voiced at this time. Medication list updated to reflect new OTC Rx.

## 2020-06-17 NOTE — Telephone Encounter (Signed)
-----   Message from Renato Shin, MD sent at 06/16/2020  5:18 PM EDT ----- please contact patient: The vitamin-D is a little low.  You should take 1000 units per day.  The other blood tests are the same.  I'll see you next time.

## 2020-06-18 ENCOUNTER — Other Ambulatory Visit: Payer: Self-pay

## 2020-06-18 ENCOUNTER — Ambulatory Visit
Admission: RE | Admit: 2020-06-18 | Discharge: 2020-06-18 | Disposition: A | Discharge status: Home / Self Care | Payer: Medicare HMO | Source: Ambulatory Visit | Attending: Radiation Oncology | Admitting: Radiation Oncology

## 2020-06-18 DIAGNOSIS — D352 Benign neoplasm of pituitary gland: Secondary | ICD-10-CM | POA: Diagnosis not present

## 2020-06-21 ENCOUNTER — Other Ambulatory Visit: Payer: Self-pay

## 2020-06-21 ENCOUNTER — Ambulatory Visit
Admission: RE | Admit: 2020-06-21 | Discharge: 2020-06-21 | Disposition: A | Discharge status: Home / Self Care | Payer: Medicare HMO | Source: Ambulatory Visit | Attending: Radiation Oncology | Admitting: Radiation Oncology

## 2020-06-21 ENCOUNTER — Ambulatory Visit: Payer: Medicare HMO

## 2020-06-21 DIAGNOSIS — D352 Benign neoplasm of pituitary gland: Secondary | ICD-10-CM | POA: Diagnosis not present

## 2020-06-22 ENCOUNTER — Ambulatory Visit: Payer: Medicare HMO | Admitting: Cardiology

## 2020-06-22 ENCOUNTER — Ambulatory Visit
Admission: RE | Admit: 2020-06-22 | Discharge: 2020-06-22 | Disposition: A | Discharge status: Home / Self Care | Payer: Medicare HMO | Source: Ambulatory Visit | Attending: Radiation Oncology | Admitting: Radiation Oncology

## 2020-06-22 ENCOUNTER — Other Ambulatory Visit: Payer: Self-pay

## 2020-06-22 DIAGNOSIS — D352 Benign neoplasm of pituitary gland: Secondary | ICD-10-CM | POA: Diagnosis not present

## 2020-06-23 ENCOUNTER — Ambulatory Visit
Admission: RE | Admit: 2020-06-23 | Discharge: 2020-06-23 | Disposition: A | Discharge status: Home / Self Care | Payer: Medicare HMO | Source: Ambulatory Visit | Attending: Radiation Oncology | Admitting: Radiation Oncology

## 2020-06-23 ENCOUNTER — Encounter: Payer: Self-pay | Admitting: Radiation Oncology

## 2020-06-23 ENCOUNTER — Other Ambulatory Visit: Payer: Self-pay

## 2020-06-23 DIAGNOSIS — D352 Benign neoplasm of pituitary gland: Secondary | ICD-10-CM | POA: Diagnosis not present

## 2020-06-30 DIAGNOSIS — R69 Illness, unspecified: Secondary | ICD-10-CM | POA: Diagnosis not present

## 2020-07-06 ENCOUNTER — Telehealth: Payer: Self-pay

## 2020-07-06 ENCOUNTER — Other Ambulatory Visit: Payer: Self-pay

## 2020-07-06 ENCOUNTER — Encounter: Payer: Self-pay | Admitting: Endocrinology

## 2020-07-06 ENCOUNTER — Ambulatory Visit (INDEPENDENT_AMBULATORY_CARE_PROVIDER_SITE_OTHER): Payer: Medicare HMO | Admitting: Endocrinology

## 2020-07-06 VITALS — BP 106/70 | HR 84 | Ht 67.0 in | Wt 214.0 lb

## 2020-07-06 DIAGNOSIS — D352 Benign neoplasm of pituitary gland: Secondary | ICD-10-CM

## 2020-07-06 DIAGNOSIS — E559 Vitamin D deficiency, unspecified: Secondary | ICD-10-CM | POA: Diagnosis not present

## 2020-07-06 LAB — BASIC METABOLIC PANEL
BUN: 19 mg/dL (ref 6–23)
CO2: 31 mEq/L (ref 19–32)
Calcium: 10.1 mg/dL (ref 8.4–10.5)
Chloride: 102 mEq/L (ref 96–112)
Creatinine, Ser: 1.42 mg/dL — ABNORMAL HIGH (ref 0.40–1.20)
GFR: 45.22 mL/min — ABNORMAL LOW (ref >60.00)
Glucose, Bld: 218 mg/dL — ABNORMAL HIGH (ref 70–99)
Potassium: 5.4 mEq/L — ABNORMAL HIGH (ref 3.5–5.1)
Sodium: 141 mEq/L (ref 135–145)

## 2020-07-06 LAB — URINALYSIS, ROUTINE W REFLEX MICROSCOPIC
Bilirubin Urine: NEGATIVE
Hgb urine dipstick: NEGATIVE
Ketones, ur: NEGATIVE
Nitrite: NEGATIVE
Specific Gravity, Urine: 1.015 (ref 1.000–1.030)
Total Protein, Urine: NEGATIVE
Urine Glucose: NEGATIVE
Urobilinogen, UA: 0.2 (ref 0.0–1.0)
pH: 6 (ref 5.0–8.0)

## 2020-07-06 LAB — FOLLICLE STIMULATING HORMONE: FSH: 0.5 m[IU]/mL

## 2020-07-06 LAB — TSH: TSH: 2.13 u[IU]/mL (ref 0.35–4.50)

## 2020-07-06 LAB — VITAMIN D 25 HYDROXY (VIT D DEFICIENCY, FRACTURES): VITD: 19.7 ng/mL — ABNORMAL LOW (ref 30.00–100.00)

## 2020-07-06 LAB — LUTEINIZING HORMONE: LH: 0.1 m[IU]/mL

## 2020-07-06 LAB — CORTISOL: Cortisol, Plasma: 12.6 ug/dL

## 2020-07-06 LAB — T4, FREE: Free T4: 0.78 ng/dL (ref 0.60–1.60)

## 2020-07-06 MED ORDER — SANDOSTATIN LAR DEPOT 20 MG IM KIT
20.0000 mg | PACK | INTRAMUSCULAR | 0 refills | Status: DC
Start: 2020-07-06 — End: 2021-07-09

## 2020-07-06 NOTE — Telephone Encounter (Signed)
Dr. Loanne Drilling provided the following message:  Pt will need to begin Sandostatin LAR IM in office (NURSE VISIT) injections q4weeks.  Per Dr. Loanne Drilling, uncertain if this medication should be sent to local pharmacy, if PA is required, what pt financial responsibility will be, if this must be processed through a specialty pharmacy, if this will need to be processed through PAP, etc.   Upon further research, [employer] is the PAP that would handle these types of inquiries, [phone]. For now, Dr. Loanne Drilling would prefer to send the Rx to a local pharmacy and handle any issues that may result from their attempts in processing Rx claim. At Dr. Cordelia Pen request, will await pharmacy response before proceeding with contacting [employer].

## 2020-07-06 NOTE — Progress Notes (Signed)
Subjective:    Patient ID: Danielle Schroeder, female    DOB: [birth year], 64 y.o.   MRN: [id number]  HPI Pt returns for f/u of GH-producing pituitary adenoma (dx'ed at uncertain time, but was prob approx 1990; she was she took some injections for this x a few mos, but she has been on no rx since then; she never had pituitary surgery; NS recommended XRT, which will start tomorrow).   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   GH: high.  Prolactin: normal, even with dilution.   FSH/LH: low ACTH/cortisol: random are normal.   VP: low (with normal Na+) A-subunit: normal.  She also has small MNG (dx'ed 2020; no nodule needed bx; plan is for f/u US).  She has finished XRT.  She has minimal headache She takes Vit-D, 1000 units/d.   Past Medical History:  Diagnosis Date  . Cataract    bilateral lens implants  . CHF (congestive heart failure) ([place])   . Diabetes mellitus without complication ([place])   . Hyperlipidemia   . Hypertension   . Pituitary adenoma with extrasellar extension ([place])   . Sleep apnea    no c-pap  . Thyroid disease    hypothyroidism    Past Surgical History:  Procedure Laterality Date  . BREAST SURGERY    . CHOLECYSTECTOMY    . TONSILLECTOMY    . TUBAL LIGATION      Social History   Socioeconomic History  . Marital status: Single    Spouse name: Not on file  . Number of children: Not on file  . Years of education: Not on file  . Highest education level: Not on file  Occupational History  . Not on file  Tobacco Use  . Smoking status: Never Smoker  . Smokeless tobacco: Never Used  Substance and Sexual Activity  . Alcohol use: Never  . Drug use: Never  . Sexual activity: Not Currently  Other Topics Concern  . Not on file  Social History Narrative  . Not on file   Social Determinants of Health   Financial Resource Strain:   . Difficulty of Paying Living Expenses: Not on file  Food  Insecurity:   . Worried About Charity fundraiser in the Last Year: Not on file  . Ran Out of Food in the Last Year: Not on file  Transportation Needs:   . Lack of Transportation (Medical): Not on file  . Lack of Transportation (Non-Medical): Not on file  Physical Activity:   . Days of Exercise per Week: Not on file  . Minutes of Exercise per Session: Not on file  Stress:   . Feeling of Stress : Not on file  Social Connections:   . Frequency of Communication with Friends and Family: Not on file  . Frequency of Social Gatherings with Friends and Family: Not on file  . Attends Religious Services: Not on file  . Active Member of Clubs or Organizations: Not on file  . Attends Archivist Meetings: Not on file  . Marital Status: Not on file  Intimate Partner Violence:   . Fear of Current or Ex-Partner: Not on file  . Emotionally Abused: Not on file  . Physically Abused: Not on file  . Sexually Abused: Not on file    Current Outpatient Medications on File Prior to Visit  Medication Sig Dispense Refill  . amLODipine (NORVASC) 10 MG tablet Take 10 mg by  mouth daily.    Marland Kitchen aspirin EC 81 MG tablet Take 81 mg by mouth daily.    . carvedilol (COREG) 25 MG tablet Take 25 mg by mouth 2 (two) times daily with a meal.    . furosemide (LASIX) 40 MG tablet Take 1 tablet (40 mg total) by mouth 2 (two) times daily. 180 tablet 1  . glucose blood test strip 1 each by Other route 3 (three) times daily. Use as instructed    . ibuprofen (ADVIL) 800 MG tablet Take 800 mg by mouth every 6 (six) hours.    . Insulin Glargine (BASAGLAR KWIKPEN [place]) Inject into the skin. 35 units BID    . Insulin Pen Needle (PEN NEEDLES) 31G X 5 MM MISC 1 each by Does not apply route daily.    Marland Kitchen levothyroxine (SYNTHROID) 75 MCG tablet Take 1 tablet (75 mcg total) by mouth daily before breakfast. 30 tablet 3  . losartan (COZAAR) 100 MG tablet Take 100 mg by mouth daily.    . metFORMIN (GLUCOPHAGE) 1000 MG tablet Take  1,000 mg by mouth 2 (two) times daily with a meal.    . potassium chloride (KLOR-CON) 10 MEQ tablet Take 1 tablet (10 mEq total) by mouth daily. 90 tablet 1  . simvastatin (ZOCOR) 20 MG tablet Take 20 mg by mouth daily.    . TRADJENTA 5 MG TABS tablet Take 5 mg by mouth daily.    . Vitamin D, Cholecalciferol, 25 MCG (1000 UT) CAPS Take 1 capsule by mouth daily. 30 capsule 0   No current facility-administered medications on file prior to visit.    No Known Allergies  Family History  Problem Relation Age of Onset  . Thyroid disease Neg Hx   . Colon cancer Neg Hx   . Esophageal cancer Neg Hx   . Rectal cancer Neg Hx   . Stomach cancer Neg Hx     BP 106/70   Pulse 84   Ht 5\' 7"  (1.702 m)   Wt 214 lb (97.1 kg)   SpO2 91%   BMI 33.52 kg/m    Review of Systems Denies n/v    Objective:   Physical Exam VITAL SIGNS:  See vs page GENERAL: no distress EXT: trace bilat leg edema.     Lab Results  Component Value Date   TSH 2.13 07/06/2020       Assessment & Plan:  GH-producing pit adenoma: due for recheck. Hypothyroidism: well-replaced.  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   Patient Instructions  Blood tests are requested for you today.  We'll let you know about the results.  In 3 more weeks, we'll start a monthly injection.  you will receive a phone call, about a day and time for an appointment. Please come back for a follow-up appointment in 2-3 months.

## 2020-07-06 NOTE — Patient Instructions (Signed)
Blood tests are requested for you today.  We'll let you know about the results.  In 3 more weeks, we'll start a monthly injection.  you will receive a phone call, about a day and time for an appointment. Please come back for a follow-up appointment in 2-3 months.

## 2020-07-10 LAB — PTH, INTACT AND CALCIUM
Calcium: 9.8 mg/dL (ref 8.6–10.4)
PTH: 66 pg/mL — ABNORMAL HIGH (ref 14–64)

## 2020-07-10 LAB — ACTH: C206 ACTH: 18 pg/mL (ref 6–50)

## 2020-07-10 LAB — GROWTH HORMONE: Growth Hormone: 19.1 ng/mL — ABNORMAL HIGH (ref <=7.1)

## 2020-07-10 LAB — INSULIN-LIKE GROWTH FACTOR
IGF-I, LC/MS: 756 ng/mL — ABNORMAL HIGH (ref 41–279)
Z-Score (Female): 4.5 SD — ABNORMAL HIGH (ref ?–2.0)

## 2020-07-12 ENCOUNTER — Encounter: Payer: Self-pay | Admitting: Cardiology

## 2020-07-12 ENCOUNTER — Other Ambulatory Visit: Payer: Self-pay

## 2020-07-12 ENCOUNTER — Telehealth: Payer: Self-pay

## 2020-07-12 ENCOUNTER — Ambulatory Visit: Payer: Medicare HMO | Admitting: Cardiology

## 2020-07-12 VITALS — BP 140/80 | HR 82 | Ht 67.0 in | Wt 213.6 lb

## 2020-07-12 DIAGNOSIS — R600 Localized edema: Secondary | ICD-10-CM | POA: Diagnosis not present

## 2020-07-12 DIAGNOSIS — E220 Acromegaly and pituitary gigantism: Secondary | ICD-10-CM | POA: Diagnosis not present

## 2020-07-12 DIAGNOSIS — E559 Vitamin D deficiency, unspecified: Secondary | ICD-10-CM

## 2020-07-12 DIAGNOSIS — I5032 Chronic diastolic (congestive) heart failure: Secondary | ICD-10-CM | POA: Diagnosis not present

## 2020-07-12 MED ORDER — VITAMIN D (CHOLECALCIFEROL) 25 MCG (1000 UT) PO CAPS: 2.0000 | ORAL_CAPSULE | Freq: Every day | ORAL | 0 refills | Status: AC

## 2020-07-12 NOTE — Progress Notes (Signed)
Cardiology Office Note:    Date:  07/12/2020   ID:  Danielle Schroeder, DOB [birth year], MRN [id number]  PCP:  Danielle Moment, MD  [facility] Cardiologist:  Danielle Furbish, MD  [facility] Electrophysiologist:  None   Referring MD: Danielle Moment, MD     History of Present Illness:    Danielle Schroeder is a 64 y.o. female here for follow-up of chronic diastolic heart failure.  Last seen by Danielle Schroeder, office note from 03/30/2020 reviewed.  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  Lower extremity edema, 4+ pitting edema mild dyspnea on exertion.    Past Medical History:  Diagnosis Date   Cataract    bilateral lens implants   CHF (congestive heart failure) ([facility])    Diabetes mellitus without complication ([place])    Hyperlipidemia    Hypertension    Pituitary adenoma with extrasellar extension ([place])    Sleep apnea    no c-pap   Thyroid disease    hypothyroidism    Past Surgical History:  Procedure Laterality Date   BREAST SURGERY     CHOLECYSTECTOMY     TONSILLECTOMY     TUBAL LIGATION      Current Medications: Current Meds  Medication Sig   amLODipine (NORVASC) 10 MG tablet Take 10 mg by mouth daily.   aspirin EC 81 MG tablet Take 81 mg by mouth daily.   carvedilol (COREG) 25 MG tablet Take 25 mg by mouth 2 (two) times daily with a meal.   furosemide (LASIX) 40 MG tablet Take 1 tablet (40 mg total) by mouth 2 (two) times daily.   glucose blood test strip 1 each by Other route 3 (three) times daily. Use as instructed   ibuprofen (ADVIL) 800 MG tablet Take 800 mg by mouth every 6 (six) hours.   Insulin Glargine (BASAGLAR KWIKPEN [place]) Inject into the skin. 35 units BID   Insulin Pen Needle (PEN NEEDLES) 31G X 5 MM MISC 1 each by Does not apply route daily.   levothyroxine (SYNTHROID) 75 MCG tablet Take 1 tablet (75 mcg total) by mouth daily before breakfast.   losartan (COZAAR) 100 MG  tablet Take 100 mg by mouth daily.   metFORMIN (GLUCOPHAGE) 1000 MG tablet Take 1,000 mg by mouth 2 (two) times daily with a meal.   octreotide (SANDOSTATIN LAR DEPOT) 20 MG injection Inject 20 mg into the muscle every 28 (twenty-eight) days.   potassium chloride (KLOR-CON) 10 MEQ tablet Take 1 tablet (10 mEq total) by mouth daily.   simvastatin (ZOCOR) 20 MG tablet Take 20 mg by mouth daily.   TRADJENTA 5 MG TABS tablet Take 5 mg by mouth daily.   Vitamin D, Cholecalciferol, 25 MCG (1000 UT) CAPS Take 2 capsules by mouth daily.     Allergies:   Patient has no known allergies.   Social History   Socioeconomic History   Marital status: Single    Spouse name: Not on file   Number of children: Not on file   Years of education: Not on file   Highest education level: Not on file  Occupational History   Not on file  Tobacco Use   Smoking status: Never Smoker   Smokeless tobacco: Never Used  Substance and Sexual Activity   Alcohol use: Never   Drug use: Never   Sexual activity: Not Currently  Other Topics Concern   Not on file  Social History Narrative   Not on file  Social Determinants of Health   Financial Resource Strain:    Difficulty of Paying Living Expenses: Not on file  Food Insecurity:    Worried About Charity fundraiser in the Last Year: Not on file   [employer] of Food in the Last Year: Not on file  Transportation Needs:    Lack of Transportation (Medical): Not on file   Lack of Transportation (Non-Medical): Not on file  Physical Activity:    Days of Exercise per Week: Not on file   Minutes of Exercise per Session: Not on file  Stress:    Feeling of Stress : Not on file  Social Connections:    Frequency of Communication with Friends and Family: Not on file   Frequency of Social Gatherings with Friends and Family: Not on file   Attends Religious Services: Not on file   Active Member of Clubs or Organizations: Not on file   Attends  Archivist Meetings: Not on file   Marital Status: Not on file     Family History: The patient's family history is negative for Thyroid disease, Colon cancer, Esophageal cancer, Rectal cancer, and Stomach cancer.  ROS:   Please see the history of present illness.     All other systems reviewed and are negative.  EKGs/Labs/Other Studies Reviewed:    The following studies were reviewed today:  Echo 01/2020-EF 60%, asymmetric septal hypertrophy on echo, no obstruction.    Recent Labs: 01/14/2020: ALT 20 03/30/2020: Hemoglobin 10.7; Platelets 188 07/06/2020: BUN 19; Creatinine, Ser 1.42; Potassium 5.4; Sodium 141; TSH 2.13  Recent Lipid Panel    Component Value Date/Time   CHOL 135 01/14/2020 1439   TRIG 81 01/14/2020 1439   HDL 82 01/14/2020 1439   CHOLHDL 1.6 01/14/2020 1439   LDLCALC 37 01/14/2020 1439    Physical Exam:    VS:  BP 140/80    Pulse 82    Ht 5\' 7"  (1.702 m)    Wt 213 lb 9.6 oz (96.9 kg)    SpO2 96%    BMI 33.45 kg/m     Wt Readings from Last 3 Encounters:  07/12/20 213 lb 9.6 oz (96.9 kg)  07/06/20 214 lb (97.1 kg)  06/09/20 (!) 213 lb 3.2 oz (96.7 kg)     GEN:  Well nourished, well developed in no acute distress HEENT: Normal, frontal bossing NECK: No JVD; No carotid bruits LYMPHATICS: No lymphadenopathy CARDIAC: RRR, no murmurs, rubs, gallops RESPIRATORY:  Clear to auscultation without rales, wheezing or rhonchi  ABDOMEN: Soft, non-tender, non-distended MUSCULOSKELETAL:  3+ LE edema; No deformity  SKIN: Warm and dry NEUROLOGIC:  Alert and oriented x 3 PSYCHIATRIC:  Normal affect   ASSESSMENT:    1. Chronic diastolic heart failure ([place])   2. Bilateral leg edema   3. Acromegaly (Danielle Schroeder)    PLAN:    In order of problems listed above:  Chronic diastolic heart failure -Overall doing well watching salt on Lasix, losartan as above, no changes made.  Lower extremity edema -In part could be exacerbated by amlodipine 10 mg.  Continue with  Lasix.  Could consider in the future transition off of the amlodipine and placing on spironolactone for instance.  Elevate legs.  Essential hypertension -Mildly elevated today.  Continue to monitor. -Continue with beta-blocker, calcium channel blocker, angiotensin receptor blocker, diuretic  Hyperlipidemia -Simvastatin 20 mg.  No myalgias. 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  No plans for operation.  On octreotide. Has had radiation but no major changes in pituitary size.    Medication Adjustments/Labs and Tests Ordered: Current medicines are reviewed at length with the patient today.  Concerns regarding medicines are outlined above.  No orders of the defined types were placed in this encounter.  No orders of the defined types were placed in this encounter.   Patient Instructions  Medication Instructions:  The current medical regimen is effective;  continue present plan and medications.  *If you need a refill on your cardiac medications before your next appointment, please call your pharmacy*  Follow-Up: At [facility], you and your health needs are our priority.  As part of our continuing mission to provide you with exceptional heart care, we have created designated Provider Care Teams.  These Care Teams include your primary Cardiologist (physician) and Advanced Practice Providers (APPs -  Physician Assistants and Nurse Practitioners) who all work together to provide you with the care you need, when you need it.  We recommend signing up for the patient portal called "MyChart".  Sign up information is provided on this After Visit Summary.  MyChart is used to connect with patients for Virtual Visits (Telemedicine).  Patients are able to view lab/test results, encounter notes, upcoming appointments, etc.  Non-urgent messages can be sent to your provider as well.   To learn more about what you can do with MyChart, go  to [email or web address].    Your next appointment:   12 month(s)  The format for your next appointment:   In Person  Provider:   Candee Furbish, MD        Signed, Danielle Furbish, MD  07/12/2020 10:08 AM    [place]

## 2020-07-12 NOTE — Telephone Encounter (Signed)
please contact a pharmacist at another [facility] office.  Is there any way this could be given in the office under medicare part B?

## 2020-07-12 NOTE — Telephone Encounter (Signed)
Called pt and informed about lab results and new orders. Verbalized acceptance and understanding. Also states her copay for the injectable monthly medication is $600.00. Advised I will forward to Dr. Loanne Drilling to make him aware.

## 2020-07-12 NOTE — Telephone Encounter (Signed)
-----   Message from Renato Shin, MD sent at 07/10/2020  9:39 AM EDT ----- please contact patient: Growth hormone is the same.  The vitamin-D is still low.  Please increase to 2000 units/day.  What is status of somatostatin-LAR?

## 2020-07-12 NOTE — Telephone Encounter (Signed)
Called pt to inform about lab results and new orders. Also wanted to f/u on status of [facility] being able to fill qmo injectable medication. Unable to LVM d/t VM not being set up.

## 2020-07-12 NOTE — Patient Instructions (Signed)

## 2020-07-13 ENCOUNTER — Telehealth: Payer: Self-pay | Admitting: Endocrinology

## 2020-07-13 DIAGNOSIS — D352 Benign neoplasm of pituitary gland: Secondary | ICD-10-CM

## 2020-07-13 NOTE — Telephone Encounter (Signed)
Have reached out to Admins at [facility] office to see who has a pharmacist and if they could get me some assistance with this patient's needs.  Pharmacist name at [facility] station is Catie Webster

## 2020-07-13 NOTE — Telephone Encounter (Signed)
please contact patient: We are unable to help her.  We need to refer to [place], or [facility].  you will receive a phone call, about a day and time for an appointment.

## 2020-07-13 NOTE — Telephone Encounter (Signed)
I do not have any connections to PCP pharmacist assistance. Do you have a contact I can send a message to for assistance with octreotide

## 2020-07-13 NOTE — Telephone Encounter (Addendum)
Danielle Schroeder and Danielle Schroeder,   Thank you for reaching out! Unfortunately, I can only provide support for patients who see a PCP here at [facility]. Her PCP is not a [facility] provider and she is not on an [employer]-contracted Medicare plan, so I do not have any other embedded pharmacist or centralized [facility] pharmacist that I can direct this to.   It does look like [employer] offers a patient assistance program for octreotide. (https://www.nov[employer]is.us/our-products/patient-assistance/patient-assistance-foundation-enrollment#ui-id-1=40 , scroll down to SANDOSTATIN LAR DEPOT). Looks like they may offer it under the Oncology program? I would recommend calling the listed number [phone]) to discuss patient's eligibility before having her fill out the application.   I am sorry I cannot provide further assistance! Thank you for reaching out. I've reached out to the pharmacist at the pediatric endo clinic [facility]) to see if she has other suggestions.   Catie Darnelle Maffucci, PharmD

## 2020-07-13 NOTE — Telephone Encounter (Signed)
Copy of the PAP and letter has been printed and mailed to patient for her to complete and return to our office. Will fax once received.

## 2020-07-13 NOTE — Telephone Encounter (Signed)
Attempted to call pt to make her aware. Unable to LVM d/t pt not having set up VM.

## 2020-07-13 NOTE — Telephone Encounter (Signed)
Ok, please start pt assistance process.  Thank you.

## 2020-07-13 NOTE — Telephone Encounter (Signed)
Hi Catie,  Dr. Loanne Drilling asked for assistance from a pharmacist with this patient and I received your name from our [place]. Are you able to help him with this patient and with her octreotide

## 2020-07-13 NOTE — Telephone Encounter (Signed)
Please refer below 

## 2020-07-15 LAB — ARGININE VASOPRESSIN HORMONE
ADH: <0.8 pg/mL (ref 0.0–4.7)
Osmolality Meas: 305 mOsmol/kg — ABNORMAL HIGH (ref 280–301)

## 2020-07-15 NOTE — Telephone Encounter (Signed)
Was able to find a pharmacist that might be able to assist she words at the [facility] location her name is Waldon Reining

## 2020-07-15 NOTE — Telephone Encounter (Signed)
Teryl Lucy,  I have received your name from our practice admin, Mingo Amber. I am including you in this communication in hopes you might be able to assist with this pt and with the medication, Otreotide. Please refer below and advise if you might be able to assist.  Thank you, Dex Blakely

## 2020-07-17 ENCOUNTER — Other Ambulatory Visit: Payer: Self-pay | Admitting: Cardiology

## 2020-07-21 DIAGNOSIS — R69 Illness, unspecified: Secondary | ICD-10-CM | POA: Diagnosis not present

## 2020-07-23 ENCOUNTER — Other Ambulatory Visit: Payer: Self-pay

## 2020-07-23 ENCOUNTER — Ambulatory Visit
Admission: RE | Admit: 2020-07-23 | Discharge: 2020-07-23 | Disposition: A | Discharge status: Home / Self Care | Payer: Medicare HMO | Source: Ambulatory Visit | Attending: Family Medicine | Admitting: Family Medicine

## 2020-07-23 DIAGNOSIS — Z1231 Encounter for screening mammogram for malignant neoplasm of breast: Secondary | ICD-10-CM | POA: Diagnosis not present

## 2020-07-27 DIAGNOSIS — E1129 Type 2 diabetes mellitus with other diabetic kidney complication: Secondary | ICD-10-CM | POA: Diagnosis not present

## 2020-07-27 DIAGNOSIS — E876 Hypokalemia: Secondary | ICD-10-CM | POA: Diagnosis not present

## 2020-07-27 DIAGNOSIS — E785 Hyperlipidemia, unspecified: Secondary | ICD-10-CM | POA: Diagnosis not present

## 2020-07-27 DIAGNOSIS — I129 Hypertensive chronic kidney disease with stage 1 through stage 4 chronic kidney disease, or unspecified chronic kidney disease: Secondary | ICD-10-CM | POA: Diagnosis not present

## 2020-07-27 DIAGNOSIS — E1122 Type 2 diabetes mellitus with diabetic chronic kidney disease: Secondary | ICD-10-CM | POA: Diagnosis not present

## 2020-07-27 DIAGNOSIS — N183 Chronic kidney disease, stage 3 unspecified: Secondary | ICD-10-CM | POA: Diagnosis not present

## 2020-07-29 DIAGNOSIS — N183 Chronic kidney disease, stage 3 unspecified: Secondary | ICD-10-CM | POA: Diagnosis not present

## 2020-07-29 DIAGNOSIS — I10 Essential (primary) hypertension: Secondary | ICD-10-CM | POA: Diagnosis not present

## 2020-07-29 DIAGNOSIS — D352 Benign neoplasm of pituitary gland: Secondary | ICD-10-CM | POA: Diagnosis not present

## 2020-07-29 DIAGNOSIS — E039 Hypothyroidism, unspecified: Secondary | ICD-10-CM | POA: Diagnosis not present

## 2020-07-29 NOTE — Progress Notes (Addendum)
Ms. 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 She saw she was told the radiation "didn't do anything for the tumor"  Wt Readings from Last 3 Encounters:  07/30/20 215 lb (97.5 kg)  07/12/20 213 lb 9.6 oz (96.9 kg)  07/06/20 214 lb (97.1 kg)   Vitals:   07/30/20 1457  BP: (!) 158/93  Pulse: 88  Resp: 20  Temp: 98 F (36.7 C)  SpO2: 98%

## 2020-07-30 ENCOUNTER — Ambulatory Visit
Admission: RE | Admit: 2020-07-30 | Discharge: 2020-07-30 | Disposition: A | Discharge status: Home / Self Care | Payer: Medicare HMO | Source: Ambulatory Visit | Attending: Radiation Oncology | Admitting: Radiation Oncology

## 2020-07-30 ENCOUNTER — Other Ambulatory Visit: Payer: Self-pay

## 2020-07-30 VITALS — BP 158/93 | HR 88 | Temp 98.0°F | Resp 20 | Ht 67.0 in | Wt 215.0 lb

## 2020-07-30 DIAGNOSIS — D352 Benign neoplasm of pituitary gland: Secondary | ICD-10-CM | POA: Diagnosis not present

## 2020-08-02 ENCOUNTER — Encounter: Payer: Self-pay | Admitting: Radiation Oncology

## 2020-08-02 NOTE — Progress Notes (Signed)
Radiation Oncology         [phone] ________________________________  Name: Danielle Schroeder MRN: [id number]  Date: 07/30/2020  DOB: 03-Oct-1956  Follow-Up Visit Note  Outpatient  CC: Vassie Moment, MD  Vassie Moment, MD  Diagnosis:      ICD-10-CM   1. Pituitary adenoma ([place])  D35.2       CHIEF COMPLAINT: Here for follow-up and surveillance of pituitary tumor    Narrative:  The patient returns today for routine follow-up.    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 She saw she was told the radiation "didn't do anything for the tumor"  Wt Readings from Last 3 Encounters:  07/30/20 215 lb (97.5 kg)  07/12/20 213 lb 9.6 oz (96.9 kg)  07/06/20 214 lb (97.1 kg)   Vitals:   07/30/20 1457  BP: (!) 158/93  Pulse: 88  Resp: 20  Temp: 98 F (36.7 C)  SpO2: 98%    ALLERGIES:  has No Known Allergies.  Meds: Current Outpatient Medications  Medication Sig Dispense Refill  . amLODipine (NORVASC) 10 MG tablet Take 5 mg by mouth daily.     Marland Kitchen aspirin EC 81 MG tablet Take 81 mg by mouth daily.    . carvedilol (COREG) 25 MG tablet Take 25 mg by mouth 2 (two) times daily with a meal.    . furosemide (LASIX) 40 MG tablet TAKE 1 TABLET BY MOUTH TWICE A DAY 180 tablet 3  . glucose blood test strip 1 each by Other route 3 (three) times daily. Use as instructed    . ibuprofen (ADVIL) 800 MG tablet Take 800 mg by mouth every 6 (six) hours.    . Insulin Glargine (BASAGLAR KWIKPEN [place]) Inject into the skin. 35 units BID    .  Insulin Pen Needle (PEN NEEDLES) 31G X 5 MM MISC 1 each by Does not apply route daily.    Marland Kitchen levothyroxine (SYNTHROID) 75 MCG tablet Take 1 tablet (75 mcg total) by mouth daily before breakfast. 30 tablet 3  . losartan (COZAAR) 100 MG tablet Take 100 mg by mouth daily.    . metFORMIN (GLUCOPHAGE) 1000 MG tablet Take 1,000 mg by mouth 2 (two) times daily with a meal.    . metolazone (ZAROXOLYN) 5 MG tablet Take 5 mg by mouth daily.    Marland Kitchen octreotide (SANDOSTATIN LAR DEPOT) 20 MG injection Inject 20 mg into the muscle every 28 (twenty-eight) days. 1 each 0  . potassium chloride (KLOR-CON) 10 MEQ tablet TAKE 1 TABLET BY MOUTH EVERY DAY 90 tablet 3  . simvastatin (ZOCOR) 20 MG tablet Take 20 mg by mouth daily.    . TRADJENTA 5 MG TABS tablet Take 5 mg by mouth daily.    . Vitamin D, Cholecalciferol, 25 MCG (1000 UT) CAPS Take 2 capsules by mouth daily. 60 capsule 0   No current facility-administered medications for this encounter.    Physical Findings: The patient is in no acute distress. Patient is alert and oriented.  height is 5\' 7"  (1.702 m) and weight is 215 lb (97.5 kg). Her temperature is 98 F (36.7 C). Her blood  pressure is 158/93 (abnormal) and her pulse is 88. Her respiration is 20 and oxygen saturation is 98%. .  No significant changes. HEENT: EOMI, acromegaly noted in facial features EXT: LE Edema  Lab Findings:  Endocrine lab work reviewed from Dr. Cordelia Pen office Growth hormone level was 19.1 three weeks ago, 28.3 one month ago.  Lab Results  Component Value Date   WBC 4.8 03/30/2020   HGB 10.7 (L) 03/30/2020   HCT 32.4 (L) 03/30/2020   MCV 86 03/30/2020   PLT 188 03/30/2020    Radiographic Findings: MM 3D SCREEN BREAST BILATERAL  Result Date: 07/27/2020 CLINICAL DATA:  Screening. EXAM: DIGITAL SCREENING BILATERAL MAMMOGRAM WITH TOMO AND CAD COMPARISON:  Previous exam(s). ACR Breast Density Category b: There are scattered areas of fibroglandular density. FINDINGS: There  are no findings suspicious for malignancy. Images were processed with CAD. IMPRESSION: No mammographic evidence of malignancy. A result letter of this screening mammogram will be mailed directly to the patient. RECOMMENDATION: Screening mammogram in one year. (Code:[id number]-B-01Y) BI-RADS CATEGORY  1: Negative. Electronically Signed   By: Nolon Nations M.D.   On: 07/27/2020 08:09    Impression/Plan:   She is doing well after radiotherapy.  I reminded the patient that her lab work will not correct itself instantaneously after radiotherapy.  For growth hormone secreting pituitary adenomas, radiotherapy alone can take several years to correct lab work and hormonal over secretion.  That is why it is important that she pursue the endocrine therapy (including the antitumor medication) with Dr. Loanne Drilling.  This will help correct her labs more quickly.  She is having trouble affording this medication and apparently his clinic is working on this with her.  I will send a note to Dr. Loanne Drilling.  Otherwise I will see her back in 5 months with restaging MRI of the brain, pituitary protocol.  She is pleased with this plan.  We discussed measures to reduce the risk of infection during the COVID-19 pandemic.  She has still not gotten her vaccination.  I again strongly recommended that she get vaccinated.  I offered her a vaccination in the cancer center today.  She has declined this but knows to contact us if she would like to pursue this.  I also explained that she can get it at her local pharmacy.  On date of service, in total, I spent 25 minutes on this encounter. Patient was seen in person.  _____________________________________   Eppie Gibson, MD

## 2020-08-05 DIAGNOSIS — I10 Essential (primary) hypertension: Secondary | ICD-10-CM | POA: Diagnosis not present

## 2020-08-05 DIAGNOSIS — Z23 Encounter for immunization: Secondary | ICD-10-CM | POA: Diagnosis not present

## 2020-08-05 DIAGNOSIS — E119 Type 2 diabetes mellitus without complications: Secondary | ICD-10-CM | POA: Diagnosis not present

## 2020-08-05 DIAGNOSIS — E039 Hypothyroidism, unspecified: Secondary | ICD-10-CM | POA: Diagnosis not present

## 2020-08-05 DIAGNOSIS — N183 Chronic kidney disease, stage 3 unspecified: Secondary | ICD-10-CM | POA: Diagnosis not present

## 2020-08-13 ENCOUNTER — Telehealth: Payer: Self-pay | Admitting: Endocrinology

## 2020-08-13 NOTE — Telephone Encounter (Signed)
Left message with patient assistance to have them fax provider portion of paperwork.

## 2020-08-13 NOTE — Progress Notes (Signed)
  Patient Name: Danielle Schroeder MRN: [id number] DOB: August 06, 1956 Referring Physician: Vassie Moment Date of Service: 06/23/2020 [facility] [place] [place], [place]                                                        End Of Treatment Note  Diagnoses: D35.2-Benign neoplasm of pituitary gland  Intent: Curative  Radiation Treatment Dates: 05/12/2020 through 06/23/2020 Site Technique Total Dose (Gy) Dose per Fx (Gy) Completed Fx Beam Energies  Brain: Brain_pituita IMRT 54/54 1.8 30/30 [id number]   Narrative: The patient tolerated radiation therapy relatively well.   Plan: The patient will follow-up with radiation oncology in 1 mo.  -----------------------------------  Eppie Gibson, MD

## 2020-08-13 NOTE — Telephone Encounter (Signed)
Representative from [employer] Patient Assistance now Oncology is calling to request that provider portion of the application be returned.  Was sent in 08/13/2020.  Representative advised of 7-10 business.  Return call [phone]  Case manager extension [phone]

## 2020-08-18 DIAGNOSIS — L603 Nail dystrophy: Secondary | ICD-10-CM | POA: Diagnosis not present

## 2020-08-18 DIAGNOSIS — E1151 Type 2 diabetes mellitus with diabetic peripheral angiopathy without gangrene: Secondary | ICD-10-CM | POA: Diagnosis not present

## 2020-08-18 DIAGNOSIS — I739 Peripheral vascular disease, unspecified: Secondary | ICD-10-CM | POA: Diagnosis not present

## 2020-08-18 DIAGNOSIS — L84 Corns and callosities: Secondary | ICD-10-CM | POA: Diagnosis not present

## 2020-08-18 NOTE — Telephone Encounter (Signed)
Patient Assistance called stating before they send the provider portion they need to know the name of the medication.

## 2020-08-23 ENCOUNTER — Telehealth: Payer: Self-pay

## 2020-08-23 NOTE — Telephone Encounter (Signed)
duplicate

## 2020-08-23 NOTE — Telephone Encounter (Signed)
New message   Application for patient assistance ..missing the provider application.   Fax # [phone]

## 2020-08-23 NOTE — Telephone Encounter (Signed)
Spoke with patient assistance program for sandostatin [place].  They will fax provider portion of form. May also look up online at [email or web address]

## 2020-09-01 DIAGNOSIS — N183 Chronic kidney disease, stage 3 unspecified: Secondary | ICD-10-CM | POA: Diagnosis not present

## 2020-09-01 DIAGNOSIS — E119 Type 2 diabetes mellitus without complications: Secondary | ICD-10-CM | POA: Diagnosis not present

## 2020-09-01 DIAGNOSIS — I10 Essential (primary) hypertension: Secondary | ICD-10-CM | POA: Diagnosis not present

## 2020-09-02 DIAGNOSIS — E119 Type 2 diabetes mellitus without complications: Secondary | ICD-10-CM | POA: Diagnosis not present

## 2020-09-02 DIAGNOSIS — Z23 Encounter for immunization: Secondary | ICD-10-CM | POA: Diagnosis not present

## 2020-09-03 LAB — LAB REPORT - SCANNED
A1c: 9.5
EGFR: 53

## 2020-09-06 DIAGNOSIS — R69 Illness, unspecified: Secondary | ICD-10-CM | POA: Diagnosis not present

## 2020-09-07 ENCOUNTER — Ambulatory Visit (INDEPENDENT_AMBULATORY_CARE_PROVIDER_SITE_OTHER): Payer: Medicare HMO | Admitting: Endocrinology

## 2020-09-07 ENCOUNTER — Other Ambulatory Visit: Payer: Self-pay

## 2020-09-07 ENCOUNTER — Encounter: Payer: Self-pay | Admitting: Endocrinology

## 2020-09-07 ENCOUNTER — Telehealth: Payer: Self-pay

## 2020-09-07 VITALS — BP 130/82 | HR 88 | Ht 67.0 in | Wt 212.0 lb

## 2020-09-07 DIAGNOSIS — E220 Acromegaly and pituitary gigantism: Secondary | ICD-10-CM | POA: Diagnosis not present

## 2020-09-07 LAB — URINALYSIS, ROUTINE W REFLEX MICROSCOPIC
Bilirubin Urine: NEGATIVE
Hgb urine dipstick: NEGATIVE
Ketones, ur: NEGATIVE
Nitrite: NEGATIVE
RBC / HPF: NONE SEEN (ref 0–?)
Specific Gravity, Urine: 1.015 (ref 1.000–1.030)
Total Protein, Urine: NEGATIVE
Urine Glucose: 100 — AB
Urobilinogen, UA: 0.2 (ref 0.0–1.0)
pH: 5.5 (ref 5.0–8.0)

## 2020-09-07 LAB — BASIC METABOLIC PANEL
BUN: 23 mg/dL (ref 6–23)
CO2: 28 mEq/L (ref 19–32)
Calcium: 10 mg/dL (ref 8.4–10.5)
Chloride: 100 mEq/L (ref 96–112)
Creatinine, Ser: 1.49 mg/dL — ABNORMAL HIGH (ref 0.40–1.20)
GFR: 37.27 mL/min — ABNORMAL LOW (ref >60.00)
Glucose, Bld: 260 mg/dL — ABNORMAL HIGH (ref 70–99)
Potassium: 4.4 mEq/L (ref 3.5–5.1)
Sodium: 138 mEq/L (ref 135–145)

## 2020-09-07 LAB — FOLLICLE STIMULATING HORMONE: FSH: 0.4 m[IU]/mL

## 2020-09-07 LAB — CORTISOL: Cortisol, Plasma: 12.8 ug/dL

## 2020-09-07 LAB — LUTEINIZING HORMONE: LH: 0.13 m[IU]/mL

## 2020-09-07 LAB — T4, FREE: Free T4: 0.92 ng/dL (ref 0.60–1.60)

## 2020-09-07 LAB — TSH: TSH: 0.15 u[IU]/mL — ABNORMAL LOW (ref 0.35–4.50)

## 2020-09-07 NOTE — Patient Instructions (Signed)
Blood tests are requested for you today.  We'll let you know about the results.   We are checking on the status of the medication from the manufacturer.  We'll let you know.   Please come back for a follow-up appointment in 3 months.

## 2020-09-07 NOTE — Telephone Encounter (Signed)
Patient assistance forms resubmitted to [facility] for Sandostatin [place].

## 2020-09-07 NOTE — Progress Notes (Signed)
Subjective:    Patient ID: Danielle Schroeder, female    DOB: 22-Apr-1956, 64 y.o.   MRN: [id number]  HPI Pt returns for f/u of GH-producing pituitary adenoma (dx'ed at uncertain time, but was prob approx 1990; she was she took some injections for this x a few mos, but she has been on no rx since then; she never had pituitary surgery; NS recommended XRT, which she had 8/21-9/21; plan is to recheck MRI in early 2021).   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   GH: high.  Prolactin: normal, even with dilution.   FSH/LH: low ACTH/cortisol: random are normal.   VP: low (with normal Na+) A-subunit: normal.  She denies headache She also has small MNG (dx'ed 2020; no nodule needed bx; plan is for f/u US).  She takes Vit-D, 100  Past Medical History:  Diagnosis Date  . Cataract    bilateral lens implants  . CHF (congestive heart failure) ([place])   . Diabetes mellitus without complication ([place])   . Hyperlipidemia   . Hypertension   . Pituitary adenoma with extrasellar extension ([place])   . Sleep apnea    no c-pap  . Thyroid disease    hypothyroidism    Past Surgical History:  Procedure Laterality Date  . BREAST SURGERY    . CHOLECYSTECTOMY    . TONSILLECTOMY    . TUBAL LIGATION      Social History   Socioeconomic History  . Marital status: Single    Spouse name: Not on file  . Number of children: Not on file  . Years of education: Not on file  . Highest education level: Not on file  Occupational History  . Not on file  Tobacco Use  . Smoking status: Never Smoker  . Smokeless tobacco: Never Used  Substance and Sexual Activity  . Alcohol use: Never  . Drug use: Never  . Sexual activity: Not Currently  Other Topics Concern  . Not on file  Social History Narrative  . Not on file   Social Determinants of Health   Financial Resource Strain:   . Difficulty of Paying Living Expenses: Not on file  Food  Insecurity:   . Worried About Charity fundraiser in the Last Year: Not on file  . Ran Out of Food in the Last Year: Not on file  Transportation Needs:   . Lack of Transportation (Medical): Not on file  . Lack of Transportation (Non-Medical): Not on file  Physical Activity:   . Days of Exercise per Week: Not on file  . Minutes of Exercise per Session: Not on file  Stress:   . Feeling of Stress : Not on file  Social Connections:   . Frequency of Communication with Friends and Family: Not on file  . Frequency of Social Gatherings with Friends and Family: Not on file  . Attends Religious Services: Not on file  . Active Member of Clubs or Organizations: Not on file  . Attends Archivist Meetings: Not on file  . Marital Status: Not on file  Intimate Partner Violence:   . Fear of Current or Ex-Partner: Not on file  . Emotionally Abused: Not on file  . Physically Abused: Not on file  . Sexually Abused: Not on file    Current Outpatient Medications on File Prior to Visit  Medication Sig Dispense Refill  . amLODipine (NORVASC) 10 MG tablet Take 5 mg by  mouth daily.     Marland Kitchen aspirin EC 81 MG tablet Take 81 mg by mouth daily.    . carvedilol (COREG) 25 MG tablet Take 25 mg by mouth 2 (two) times daily with a meal.    . furosemide (LASIX) 40 MG tablet TAKE 1 TABLET BY MOUTH TWICE A DAY 180 tablet 3  . glucose blood test strip 1 each by Other route 3 (three) times daily. Use as instructed    . ibuprofen (ADVIL) 800 MG tablet Take 800 mg by mouth every 6 (six) hours.    . Insulin Glargine (BASAGLAR KWIKPEN [place]) Inject into the skin. 35 units BID    . Insulin Pen Needle (PEN NEEDLES) 31G X 5 MM MISC 1 each by Does not apply route daily.    Marland Kitchen levothyroxine (SYNTHROID) 75 MCG tablet Take 1 tablet (75 mcg total) by mouth daily before breakfast. 30 tablet 3  . losartan (COZAAR) 100 MG tablet Take 100 mg by mouth daily.    . metFORMIN (GLUCOPHAGE) 1000 MG tablet Take 1,000 mg by mouth 2 (two)  times daily with a meal.    . metolazone (ZAROXOLYN) 5 MG tablet Take 5 mg by mouth daily.    Marland Kitchen octreotide (SANDOSTATIN LAR DEPOT) 20 MG injection Inject 20 mg into the muscle every 28 (twenty-eight) days. 1 each 0  . potassium chloride (KLOR-CON) 10 MEQ tablet TAKE 1 TABLET BY MOUTH EVERY DAY 90 tablet 3  . simvastatin (ZOCOR) 20 MG tablet Take 20 mg by mouth daily.    . TRADJENTA 5 MG TABS tablet Take 5 mg by mouth daily.    . Vitamin D, Cholecalciferol, 25 MCG (1000 UT) CAPS Take 2 capsules by mouth daily. 60 capsule 0   No current facility-administered medications on file prior to visit.    No Known Allergies  Family History  Problem Relation Age of Onset  . Thyroid disease Neg Hx   . Colon cancer Neg Hx   . Esophageal cancer Neg Hx   . Rectal cancer Neg Hx   . Stomach cancer Neg Hx     BP 130/82   Pulse 88   Ht 5\' 7"  (1.702 m)   Wt 212 lb (96.2 kg)   SpO2 91%   BMI 33.20 kg/m    Review of Systems Denies visual loss    Objective:   Physical Exam VITAL SIGNS:  See vs page GENERAL: no distress EXT: 1+ bilat leg edema.    Lab Results  Component Value Date   TSH 0.15 (L) 09/07/2020       Assessment & Plan:  Hypothyroidism, prob central: well-replaced GH-prod pituitary tumor: check labs today.  Patient Instructions  Blood tests are requested for you today.  We'll let you know about the results.   We are checking on the status of the medication from the manufacturer.  We'll let you know.   Please come back for a follow-up appointment in 3 months.

## 2020-09-14 LAB — INSULIN-LIKE GROWTH FACTOR
IGF-I, LC/MS: 777 ng/mL — ABNORMAL HIGH (ref 41–279)
Z-Score (Female): 4.7 SD — ABNORMAL HIGH (ref ?–2.0)

## 2020-09-14 LAB — ARGININE VASOPRESSIN HORMONE
ADH: <0.8 pg/mL (ref 0.0–4.7)
Osmolality Meas: 304 mOsmol/kg — ABNORMAL HIGH (ref 280–301)

## 2020-09-14 LAB — EXTRA SPECIMEN

## 2020-09-14 LAB — PROLACTIN: Prolactin: 3.3 ng/mL

## 2020-09-14 LAB — GROWTH HORMONE: Growth Hormone: 14.2 ng/mL — ABNORMAL HIGH (ref <=7.1)

## 2020-09-14 LAB — ACTH: C206 ACTH: 19 pg/mL (ref 6–50)

## 2020-09-15 ENCOUNTER — Other Ambulatory Visit: Payer: Self-pay

## 2020-09-16 ENCOUNTER — Telehealth: Payer: Self-pay

## 2020-09-16 DIAGNOSIS — E119 Type 2 diabetes mellitus without complications: Secondary | ICD-10-CM | POA: Diagnosis not present

## 2020-09-16 DIAGNOSIS — N183 Chronic kidney disease, stage 3 unspecified: Secondary | ICD-10-CM | POA: Diagnosis not present

## 2020-09-16 DIAGNOSIS — I10 Essential (primary) hypertension: Secondary | ICD-10-CM | POA: Diagnosis not present

## 2020-09-16 DIAGNOSIS — E039 Hypothyroidism, unspecified: Secondary | ICD-10-CM | POA: Diagnosis not present

## 2020-09-16 NOTE — Telephone Encounter (Signed)
-----   Message from Renato Shin, MD sent at 09/15/2020  7:21 PM EDT ----- please contact patient: Same results.  Please continue the same medication.  I'll see you next time.

## 2020-09-16 NOTE — Telephone Encounter (Signed)
Patient aware of results and recommendations. °

## 2020-10-28 ENCOUNTER — Telehealth: Payer: Self-pay | Admitting: Endocrinology

## 2020-10-28 ENCOUNTER — Other Ambulatory Visit: Payer: Self-pay

## 2020-10-28 MED ORDER — LEVOTHYROXINE SODIUM 75 MCG PO TABS: 75.0000 ug | ORAL_TABLET | Freq: Every day | ORAL | 3 refills | Status: DC

## 2020-10-28 NOTE — Telephone Encounter (Signed)
RX sent to pharmacy  

## 2020-10-28 NOTE — Telephone Encounter (Signed)
Patient called and has requested a refill of levothyroxine (SYNTHROID) 75 MCG tablet to be sent to the [facility] on [address]

## 2020-10-28 NOTE — Telephone Encounter (Signed)
Sent to pharmacy 

## 2020-11-01 ENCOUNTER — Other Ambulatory Visit: Payer: Self-pay | Admitting: Radiation Therapy

## 2020-11-01 DIAGNOSIS — D352 Benign neoplasm of pituitary gland: Secondary | ICD-10-CM

## 2020-11-02 ENCOUNTER — Telehealth: Payer: Self-pay | Admitting: Radiation Therapy

## 2020-11-02 NOTE — Telephone Encounter (Signed)
Spoke with Ms. Sadik about her upcoming MRI and follow-up with Dr. Zada Finders in [place]. SHe has written down the appointment information and plans to attend.    MRI 2/10 [facility]  Follow-up with Dr. Zada Finders 2/17 @ 12:00

## 2020-12-02 ENCOUNTER — Encounter (INDEPENDENT_AMBULATORY_CARE_PROVIDER_SITE_OTHER): Payer: Medicare HMO | Admitting: Ophthalmology

## 2020-12-09 ENCOUNTER — Other Ambulatory Visit: Payer: Self-pay

## 2020-12-09 ENCOUNTER — Ambulatory Visit (INDEPENDENT_AMBULATORY_CARE_PROVIDER_SITE_OTHER): Payer: Medicare HMO | Admitting: Endocrinology

## 2020-12-09 DIAGNOSIS — D352 Benign neoplasm of pituitary gland: Secondary | ICD-10-CM

## 2020-12-09 LAB — POCT GLYCOSYLATED HEMOGLOBIN (HGB A1C): Hemoglobin A1C: 8.8 % — AB (ref 4.0–5.6)

## 2020-12-09 LAB — T4, FREE: Free T4: 1 ng/dL (ref 0.60–1.60)

## 2020-12-09 LAB — BASIC METABOLIC PANEL
BUN: 19 mg/dL (ref 6–23)
CO2: 32 mEq/L (ref 19–32)
Calcium: 10.5 mg/dL (ref 8.4–10.5)
Chloride: 99 mEq/L (ref 96–112)
Creatinine, Ser: 1.38 mg/dL — ABNORMAL HIGH (ref 0.40–1.20)
GFR: 40.79 mL/min — ABNORMAL LOW (ref >60.00)
Glucose, Bld: 239 mg/dL — ABNORMAL HIGH (ref 70–99)
Potassium: 4.8 mEq/L (ref 3.5–5.1)
Sodium: 139 mEq/L (ref 135–145)

## 2020-12-09 LAB — TSH: TSH: 0.2 u[IU]/mL — ABNORMAL LOW (ref 0.35–4.50)

## 2020-12-09 NOTE — Progress Notes (Signed)
Subjective:    Patient ID: Danielle Schroeder, female    DOB: 07-Nov-1956, 65 y.o.   MRN: [id number]  HPI Pt returns for f/u of GH-producing pituitary adenoma (dx'ed at uncertain time, but was prob approx 1990; she was she took some injections for this x a few mos, but she has been on no rx since then; she never had pituitary surgery; NS recommended XRT, which she had 8/21-9/21).  F/u MRI is sched for 12/23/20.  sandostatin LAR is approved through pt assistance.   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   GH: high.  Prolactin: normal, even with dilution.   FSH/LH: low ACTH/cortisol: random are normal.   VP: low (with normal Na+) ASUB: normal.  She also has small MNG (dx'ed 2020; no nodule needed bx; plan is for f/u US).   She takes Vit-D, 1000 units/d.   Past Medical History:  Diagnosis Date  . Cataract    bilateral lens implants  . CHF (congestive heart failure) ([place])   . Diabetes mellitus without complication ([place])   . Hyperlipidemia   . Hypertension   . Pituitary adenoma with extrasellar extension ([place])   . Sleep apnea    no c-pap  . Thyroid disease    hypothyroidism    Past Surgical History:  Procedure Laterality Date  . BREAST SURGERY    . CHOLECYSTECTOMY    . TONSILLECTOMY    . TUBAL LIGATION      Social History   Socioeconomic History  . Marital status: Single    Spouse name: Not on file  . Number of children: Not on file  . Years of education: Not on file  . Highest education level: Not on file  Occupational History  . Not on file  Tobacco Use  . Smoking status: Never Smoker  . Smokeless tobacco: Never Used  Substance and Sexual Activity  . Alcohol use: Never  . Drug use: Never  . Sexual activity: Not Currently  Other Topics Concern  . Not on file  Social History Narrative  . Not on file   Social Determinants of Health   Financial Resource Strain: Not on file  Food Insecurity: Not on file   Transportation Needs: Not on file  Physical Activity: Not on file  Stress: Not on file  Social Connections: Not on file  Intimate Partner Violence: Not on file    Current Outpatient Medications on File Prior to Visit  Medication Sig Dispense Refill  . amLODipine (NORVASC) 10 MG tablet Take 5 mg by mouth daily.     Marland Kitchen aspirin EC 81 MG tablet Take 81 mg by mouth daily.    . carvedilol (COREG) 25 MG tablet Take 25 mg by mouth 2 (two) times daily with a meal.    . furosemide (LASIX) 40 MG tablet TAKE 1 TABLET BY MOUTH TWICE A DAY 180 tablet 3  . glucose blood test strip 1 each by Other route 3 (three) times daily. Use as instructed    . ibuprofen (ADVIL) 800 MG tablet Take 800 mg by mouth every 6 (six) hours.    . Insulin Glargine (BASAGLAR KWIKPEN [place]) Inject into the skin. 35 units BID    . Insulin Pen Needle (PEN NEEDLES) 31G X 5 MM MISC 1 each by Does not apply route daily.    Marland Kitchen levothyroxine (SYNTHROID) 75 MCG tablet Take 1 tablet (75 mcg total) by mouth daily before breakfast. 30 tablet 3  .  losartan (COZAAR) 100 MG tablet Take 100 mg by mouth daily.    . metFORMIN (GLUCOPHAGE) 1000 MG tablet Take 1,000 mg by mouth 2 (two) times daily with a meal.    . metolazone (ZAROXOLYN) 5 MG tablet Take 5 mg by mouth daily.    Marland Kitchen octreotide (SANDOSTATIN LAR DEPOT) 20 MG injection Inject 20 mg into the muscle every 28 (twenty-eight) days. 1 each 0  . potassium chloride (KLOR-CON) 10 MEQ tablet TAKE 1 TABLET BY MOUTH EVERY DAY 90 tablet 3  . simvastatin (ZOCOR) 20 MG tablet Take 20 mg by mouth daily.    . TRADJENTA 5 MG TABS tablet Take 5 mg by mouth daily.    . Vitamin D, Cholecalciferol, 25 MCG (1000 UT) CAPS Take 2 capsules by mouth daily. 60 capsule 0   No current facility-administered medications on file prior to visit.    No Known Allergies  Family History  Problem Relation Age of Onset  . Thyroid disease Neg Hx   . Colon cancer Neg Hx   . Esophageal cancer Neg Hx   . Rectal cancer Neg  Hx   . Stomach cancer Neg Hx     BP (!) 160/96 (BP Location: Right Arm, Patient Position: Sitting, Cuff Size: Large)   Pulse 86   Ht 5\' 7"  (1.702 m)   Wt 221 lb 12.8 oz (100.6 kg)   SpO2 96%   BMI 34.74 kg/m     Review of Systems Denies headache and visual loss.      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  We'll start sandostatin LAR 20 mg q month, when we are able to obtain it.  Pt is given new forms for pt assistance.  She will bring back to [place].  Central hypothyroidism: well-replaced.  Please continue the same synthroid.   Patient Instructions  Blood tests are requested for you today.  We'll let you know about the results.   Please come back for a follow-up appointment in 2 months.

## 2020-12-09 NOTE — Patient Instructions (Addendum)
Blood tests are requested for you today.  We'll let you know about the results.  Please come back for a follow-up appointment in 2 months.   

## 2020-12-14 ENCOUNTER — Telehealth: Payer: Self-pay | Admitting: *Deleted

## 2020-12-14 NOTE — Telephone Encounter (Signed)
Sent/faxed Patient Assistant [facility]--[phone]

## 2020-12-15 ENCOUNTER — Other Ambulatory Visit: Payer: Self-pay | Admitting: Radiation Therapy

## 2020-12-15 DIAGNOSIS — D352 Benign neoplasm of pituitary gland: Secondary | ICD-10-CM

## 2020-12-15 LAB — GROWTH HORMONE: Growth Hormone: 17.5 ng/mL — ABNORMAL HIGH (ref <=7.1)

## 2020-12-15 LAB — INSULIN-LIKE GROWTH FACTOR
IGF-I, LC/MS: 757 ng/mL — ABNORMAL HIGH (ref 41–279)
Z-Score (Female): 4.6 SD — ABNORMAL HIGH (ref ?–2.0)

## 2020-12-15 NOTE — Addendum Note (Signed)
Addended by: Pincus Large on: 12/15/2020 11:50 AM   Modules accepted: Orders

## 2020-12-16 ENCOUNTER — Other Ambulatory Visit: Payer: Self-pay

## 2020-12-16 ENCOUNTER — Ambulatory Visit (INDEPENDENT_AMBULATORY_CARE_PROVIDER_SITE_OTHER): Payer: Medicare HMO | Admitting: Ophthalmology

## 2020-12-16 ENCOUNTER — Encounter (INDEPENDENT_AMBULATORY_CARE_PROVIDER_SITE_OTHER): Payer: Self-pay | Admitting: Ophthalmology

## 2020-12-16 DIAGNOSIS — H43822 Vitreomacular adhesion, left eye: Secondary | ICD-10-CM | POA: Insufficient documentation

## 2020-12-16 DIAGNOSIS — E113512 Type 2 diabetes mellitus with proliferative diabetic retinopathy with macular edema, left eye: Secondary | ICD-10-CM

## 2020-12-16 DIAGNOSIS — E113551 Type 2 diabetes mellitus with stable proliferative diabetic retinopathy, right eye: Secondary | ICD-10-CM | POA: Diagnosis not present

## 2020-12-16 NOTE — Assessment & Plan Note (Signed)
Observe

## 2020-12-16 NOTE — Assessment & Plan Note (Signed)

## 2020-12-16 NOTE — Progress Notes (Signed)
12/16/2020     CHIEF COMPLAINT Patient presents for Retina Follow Up (9 MO FU OU///Pt reports slight change in vision OU. Pt denies any new F/F, pain, or pressure. ///Last A1C: 8.4 taken 06/2020//Last BS: 151 this AM )   HISTORY OF PRESENT ILLNESS: Danielle Schroeder is a 65 y.o. female who presents to the clinic today for:   HPI    Retina Follow Up    Patient presents with  Diabetic Retinopathy.  In both eyes.  This started 9 months ago.  Duration of 9 months.  Since onset it is stable. Additional comments: 9 MO FU OU   Pt reports slight change in vision OU. Pt denies any new F/F, pain, or pressure.    Last A1C: 8.4 taken 06/2020  Last BS: 151 this AM        Last edited by Nichola Sizer D on 12/16/2020  9:30 AM. (History)      Referring physician: Vassie Moment, MD [place],  [place] [address]  HISTORICAL INFORMATION:   Selected notes from the MEDICAL RECORD NUMBER    Lab Results  Component Value Date   HGBA1C 8.8 (A) 12/09/2020     CURRENT MEDICATIONS: No current outpatient medications on file. (Ophthalmic Drugs)   No current facility-administered medications for this visit. (Ophthalmic Drugs)   Current Outpatient Medications (Other)  Medication Sig  . amLODipine (NORVASC) 10 MG tablet Take 5 mg by mouth daily.   Marland Kitchen aspirin EC 81 MG tablet Take 81 mg by mouth daily.  . carvedilol (COREG) 25 MG tablet Take 25 mg by mouth 2 (two) times daily with a meal.  . furosemide (LASIX) 40 MG tablet TAKE 1 TABLET BY MOUTH TWICE A DAY  . glucose blood test strip 1 each by Other route 3 (three) times daily. Use as instructed  . ibuprofen (ADVIL) 800 MG tablet Take 800 mg by mouth every 6 (six) hours.  . Insulin Glargine (BASAGLAR KWIKPEN [place]) Inject into the skin. 35 units BID  . Insulin Pen Needle (PEN NEEDLES) 31G X 5 MM MISC 1 each by Does not apply route daily.  Marland Kitchen levothyroxine (SYNTHROID) 75 MCG tablet Take 1 tablet (75 mcg total) by mouth daily before  breakfast.  . losartan (COZAAR) 100 MG tablet Take 100 mg by mouth daily.  . metFORMIN (GLUCOPHAGE) 1000 MG tablet Take 1,000 mg by mouth 2 (two) times daily with a meal.  . metolazone (ZAROXOLYN) 5 MG tablet Take 5 mg by mouth daily.  Marland Kitchen octreotide (SANDOSTATIN LAR DEPOT) 20 MG injection Inject 20 mg into the muscle every 28 (twenty-eight) days.  . potassium chloride (KLOR-CON) 10 MEQ tablet TAKE 1 TABLET BY MOUTH EVERY DAY  . simvastatin (ZOCOR) 20 MG tablet Take 20 mg by mouth daily.  . TRADJENTA 5 MG TABS tablet Take 5 mg by mouth daily.  . Vitamin D, Cholecalciferol, 25 MCG (1000 UT) CAPS Take 2 capsules by mouth daily.   No current facility-administered medications for this visit. (Other)      REVIEW OF SYSTEMS:    ALLERGIES No Known Allergies  PAST MEDICAL HISTORY Past Medical History:  Diagnosis Date  . Cataract    bilateral lens implants  . CHF (congestive heart failure) ([place])   . Diabetes mellitus without complication ([place])   . Hyperlipidemia   . Hypertension   . Pituitary adenoma with extrasellar extension ([place])   . Sleep apnea    no c-pap  . Thyroid disease  hypothyroidism   Past Surgical History:  Procedure Laterality Date  . BREAST SURGERY    . CHOLECYSTECTOMY    . TONSILLECTOMY    . TUBAL LIGATION      FAMILY HISTORY Family History  Problem Relation Age of Onset  . Thyroid disease Neg Hx   . Colon cancer Neg Hx   . Esophageal cancer Neg Hx   . Rectal cancer Neg Hx   . Stomach cancer Neg Hx     SOCIAL HISTORY Social History   Tobacco Use  . Smoking status: Never Smoker  . Smokeless tobacco: Never Used  Substance Use Topics  . Alcohol use: Never  . Drug use: Never         OPHTHALMIC EXAM: Base Eye Exam    Visual Acuity (ETDRS)      Right Left   Dist [place] 20/60 20/20   Dist ph [place] 20/30 -2        Tonometry (Tonopen, 9:35 AM)      Right Left   Pressure 18 15       Pupils      Pupils Dark Light Shape React APD   Right PERRL 3  3 Round Minimal None   Left PERRL 3 3 Round Minimal None       Visual Fields (Counting fingers)      Left Right    Full Full       Extraocular Movement      Right Left    Full Full       Neuro/Psych    Oriented x3: Yes   Mood/Affect: Normal       Dilation    Both eyes: 1.0% Mydriacyl, 2.5% Phenylephrine @ 9:35 AM        Slit Lamp and Fundus Exam    External Exam      Right Left   External Normal Normal       Slit Lamp Exam      Right Left   Lids/Lashes Normal Normal   Conjunctiva/Sclera White and quiet White and quiet   Cornea Clear Clear   Anterior Chamber Deep and quiet Deep and quiet   Iris Round and reactive Round and reactive   Lens Posterior chamber intraocular lens Posterior chamber intraocular lens   Anterior Vitreous Normal Normal       Fundus Exam      Right Left   Posterior Vitreous Normal Normal   Disc Normal Normal   C/D Ratio 0.3 0.2   Macula no macular thickening, Focal laser scars no macular thickening, Focal laser scars   Vessels PDR quiescent,, PDR quiescent,,   Periphery Good moderate scatter PRP Good moderate scatter PRP          IMAGING AND PROCEDURES  Imaging and Procedures for 12/16/20  OCT, Retina - OU - Both Eyes       Right Eye Quality was good. Scan locations included subfoveal. Central Foveal Thickness: 238. Findings include abnormal foveal contour.   Left Eye Quality was good. Scan locations included subfoveal. Central Foveal Thickness: 253. Findings include abnormal foveal contour, vitreomacular adhesion .   Notes No active maculopathy OU.  We will continue to observe.                ASSESSMENT/PLAN:  Stable treated proliferative diabetic retinopathy of right eye determined by examination associated with type 2 diabetes mellitus ([place]) The nature of regressed proliferative diabetic retinopathy was discussed with the patient. The patient was advised to maintain good glucose,  blood pressure, monitor kidney  function and serum lipid control as advised by personal physician. Rare risk for reactivation of progression exist with untreated severe anemia, untreated renal failure, untreated heart failure, and smoking. Complete avoidance of smoking was recommended. The chance of recurrent proliferative diabetic retinopathy was discussed as well as the chance of vitreous hemorrhage for which further treatments may be necessary.   Explained to the patient that the quiescent  proliferative diabetic retinopathy disease is unlikely to ever worsen.  Worsening factors would include however severe anemia, hypertension out-of-control or impending renal failure.  Proliferative diabetic retinopathy of left eye with macular edema associated with type 2 diabetes mellitus ([place]) The nature of regressed proliferative diabetic retinopathy was discussed with the patient. The patient was advised to maintain good glucose, blood pressure, monitor kidney function and serum lipid control as advised by personal physician. Rare risk for reactivation of progression exist with untreated severe anemia, untreated renal failure, untreated heart failure, and smoking. Complete avoidance of smoking was recommended. The chance of recurrent proliferative diabetic retinopathy was discussed as well as the chance of vitreous hemorrhage for which further treatments may be necessary.   Explained to the patient that the quiescent  proliferative diabetic retinopathy disease is unlikely to ever worsen.  Worsening factors would include however severe anemia, hypertension out-of-control or impending renal failure.  Vitreomacular adhesion, left Observe      ICD-10-CM   1. Stable treated proliferative diabetic retinopathy of right eye determined by examination associated with type 2 diabetes mellitus ([place])  E11.3551 OCT, Retina - OU - Both Eyes  2. 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 Vitreomacular adhesion, left  H43.822     1.  Quiescent proliferative diabetic retinopathy.  Patient understands importance of continued blood sugar control monitoring.  2.  3.  Ophthalmic Meds Ordered this visit:  No orders of the defined types were placed in this encounter.      Return in about 9 months (around 09/15/2021) for DILATE OU, COLOR FP, OCT.  There are no Patient Instructions on file for this visit.   Explained the diagnoses, plan, and follow up with the patient and they expressed understanding.  Patient expressed understanding of the importance of proper follow up care.   Clent Demark Ehab Humber M.D. Diseases & Surgery of the Retina and Vitreous Retina & Diabetic [place] 12/16/20     Abbreviations: M myopia (nearsighted); A astigmatism; H hyperopia (farsighted); P presbyopia; Mrx spectacle prescription;  CTL contact lenses; OD right eye; OS left eye; OU both eyes  XT exotropia; ET esotropia; PEK punctate epithelial keratitis; PEE punctate epithelial erosions; DES dry eye syndrome; MGD meibomian gland dysfunction; ATs artificial tears; PFAT's preservative free artificial tears; [place] nuclear sclerotic cataract; PSC posterior subcapsular cataract; ERM epi-retinal membrane; PVD posterior vitreous detachment; RD retinal detachment; DM diabetes mellitus; DR diabetic retinopathy; NPDR non-proliferative diabetic retinopathy; PDR proliferative diabetic retinopathy; CSME clinically significant macular edema; DME diabetic macular edema; dbh dot blot hemorrhages; CWS cotton wool spot; POAG primary open angle glaucoma; C/D cup-to-disc ratio; HVF humphrey visual field; GVF goldmann visual field; OCT optical coherence tomography; IOP intraocular pressure; BRVO Branch retinal vein occlusion; CRVO central retinal vein occlusion; CRAO central retinal artery occlusion; BRAO branch retinal artery occlusion; RT retinal tear; SB scleral buckle; PPV pars plana  vitrectomy; VH Vitreous hemorrhage; PRP panretinal laser photocoagulation; IVK intravitreal kenalog; VMT vitreomacular  traction; MH Macular hole;  NVD neovascularization of the disc; NVE neovascularization elsewhere; AREDS age related eye disease study; ARMD age related macular degeneration; POAG primary open angle glaucoma; EBMD epithelial/anterior basement membrane dystrophy; ACIOL anterior chamber intraocular lens; IOL intraocular lens; PCIOL posterior chamber intraocular lens; Phaco/IOL phacoemulsification with intraocular lens placement; [place] photorefractive keratectomy; LASIK laser assisted in situ keratomileusis; HTN hypertension; DM diabetes mellitus; COPD chronic obstructive pulmonary disease

## 2020-12-23 ENCOUNTER — Other Ambulatory Visit: Payer: Medicare HMO

## 2020-12-24 ENCOUNTER — Telehealth: Payer: Self-pay | Admitting: Endocrinology

## 2020-12-24 NOTE — Telephone Encounter (Signed)
Patient Assistance called for [employer] and advised that the Octreotide (Sandostatin) will require a PA

## 2020-12-25 ENCOUNTER — Ambulatory Visit (HOSPITAL_COMMUNITY)
Admission: RE | Admit: 2020-12-25 | Discharge: 2020-12-25 | Disposition: A | Discharge status: Home / Self Care | Payer: Medicare HMO | Source: Ambulatory Visit | Attending: Radiation Oncology | Admitting: Radiation Oncology

## 2020-12-25 ENCOUNTER — Other Ambulatory Visit: Payer: Self-pay

## 2020-12-25 DIAGNOSIS — D352 Benign neoplasm of pituitary gland: Secondary | ICD-10-CM | POA: Insufficient documentation

## 2020-12-25 DIAGNOSIS — D497 Neoplasm of unspecified behavior of endocrine glands and other parts of nervous system: Secondary | ICD-10-CM | POA: Diagnosis not present

## 2020-12-25 DIAGNOSIS — I6782 Cerebral ischemia: Secondary | ICD-10-CM | POA: Diagnosis not present

## 2020-12-25 MED ORDER — GADOBUTROL 1 MMOL/ML IV SOLN
10.0000 mL | Freq: Once | INTRAVENOUS | Status: AC | PRN
  Administered 2020-12-25: 10 mL via INTRAVENOUS

## 2020-12-27 ENCOUNTER — Inpatient Hospital Stay: Payer: Medicare HMO | Attending: Radiation Oncology

## 2020-12-27 NOTE — Telephone Encounter (Signed)
Please advise 

## 2020-12-27 NOTE — Telephone Encounter (Signed)
This is an ongoing matter.  Do we still have the papers?

## 2020-12-29 NOTE — Telephone Encounter (Signed)
Yes,we do have the form from Patient assistant [employer].

## 2020-12-29 NOTE — Telephone Encounter (Signed)
please call [employer]: What do we need to do next?

## 2020-12-30 DIAGNOSIS — E220 Acromegaly and pituitary gigantism: Secondary | ICD-10-CM | POA: Diagnosis not present

## 2020-12-30 DIAGNOSIS — D352 Benign neoplasm of pituitary gland: Secondary | ICD-10-CM | POA: Diagnosis not present

## 2020-12-30 NOTE — Telephone Encounter (Signed)
We can do PA or other alternatives.

## 2020-12-30 NOTE — Telephone Encounter (Signed)
This is not a PA, but pt assistance

## 2021-01-03 NOTE — Telephone Encounter (Signed)
Started PA--[id number] (covermymeds)--waiting for approval.

## 2021-01-05 ENCOUNTER — Telehealth: Payer: Self-pay

## 2021-01-05 NOTE — Telephone Encounter (Signed)
A PA has been approved for pt for [facility] kit

## 2021-01-06 NOTE — Telephone Encounter (Signed)
[employer] Patient Assistance called asking if we could please fax over the approved PA to [phone].

## 2021-01-07 NOTE — Telephone Encounter (Signed)
Faxed he approved PA to [phone].[employer] Patient Assistance .

## 2021-01-14 NOTE — Telephone Encounter (Signed)
[employer] called to relay benefit information regarding [employer]. Based off of insurance they state the pt will be responsible for 15% of the total cost. They request for a nurse to give her a call once they receive this message at [phone] extension [phone].

## 2021-01-17 NOTE — Telephone Encounter (Signed)
Called pt but no VM set up. I did speak with her daughter Phineas Real and she will F/U with mother

## 2021-01-18 DIAGNOSIS — E1151 Type 2 diabetes mellitus with diabetic peripheral angiopathy without gangrene: Secondary | ICD-10-CM | POA: Diagnosis not present

## 2021-01-18 DIAGNOSIS — L84 Corns and callosities: Secondary | ICD-10-CM | POA: Diagnosis not present

## 2021-01-18 DIAGNOSIS — I739 Peripheral vascular disease, unspecified: Secondary | ICD-10-CM | POA: Diagnosis not present

## 2021-01-18 DIAGNOSIS — L603 Nail dystrophy: Secondary | ICD-10-CM | POA: Diagnosis not present

## 2021-01-20 ENCOUNTER — Telehealth: Payer: Self-pay | Admitting: Endocrinology

## 2021-01-20 NOTE — Telephone Encounter (Signed)
[employer] Patient Assistance called regarding the benefit information for pt's Sandostatin.  They stated they had relayed benefit information to Mardene Celeste and was just calling back to check in on that. She asks that Mardene Celeste give her a call back at ph# [phone] direct extension [phone].

## 2021-01-21 NOTE — Telephone Encounter (Signed)
Returned call to [employer] Patient Assistance to let them know that I spoke with the pt's daughter to let her know that they will be responsible for the remaining 15% out-of-pocket and gave them the amt that they were responsible for and pt's daughter said that she would let me know what they decided to do and would cb when the decision was made.

## 2021-01-27 ENCOUNTER — Other Ambulatory Visit: Payer: Self-pay

## 2021-01-27 ENCOUNTER — Telehealth: Payer: Self-pay | Admitting: Endocrinology

## 2021-01-27 MED ORDER — LEVOTHYROXINE SODIUM 75 MCG PO TABS: 75.0000 ug | ORAL_TABLET | Freq: Every day | ORAL | 3 refills | Status: DC

## 2021-01-27 NOTE — Telephone Encounter (Signed)
MEDICATION: levothyroxine  PHARMACY:   [id number] - [place], [place] - [address] Phone:  [phone]  Fax:  [phone]      HAS THE PATIENT CONTACTED THEIR PHARMACY?  no  IS THIS A 90 DAY SUPPLY : pt doesn't know  IS PATIENT OUT OF MEDICATION: yes  IF NOT; HOW MUCH IS LEFT:   LAST APPOINTMENT DATE: @3 /08/2021  NEXT APPOINTMENT DATE:@3 /31/2022  DO WE HAVE YOUR PERMISSION TO LEAVE A DETAILED MESSAGE?:  OTHER COMMENTS:    **Let patient know to contact pharmacy at the end of the day to make sure medication is ready. **  ** Please notify patient to allow 48-72 hours to process**  **Encourage patient to contact the pharmacy for refills or they can request refills through [facility]**

## 2021-02-10 ENCOUNTER — Ambulatory Visit (INDEPENDENT_AMBULATORY_CARE_PROVIDER_SITE_OTHER): Payer: Medicare HMO | Admitting: Endocrinology

## 2021-02-10 ENCOUNTER — Other Ambulatory Visit: Payer: Self-pay

## 2021-02-10 ENCOUNTER — Telehealth: Payer: Self-pay

## 2021-02-10 ENCOUNTER — Encounter: Payer: Self-pay | Admitting: Endocrinology

## 2021-02-10 VITALS — BP 160/92 | HR 86 | Ht 67.0 in | Wt 211.0 lb

## 2021-02-10 DIAGNOSIS — D352 Benign neoplasm of pituitary gland: Secondary | ICD-10-CM | POA: Diagnosis not present

## 2021-02-10 DIAGNOSIS — E039 Hypothyroidism, unspecified: Secondary | ICD-10-CM

## 2021-02-10 LAB — BASIC METABOLIC PANEL
BUN: 13 mg/dL (ref 6–23)
CO2: 30 mEq/L (ref 19–32)
Calcium: 10.1 mg/dL (ref 8.4–10.5)
Chloride: 96 mEq/L (ref 96–112)
Creatinine, Ser: 1.41 mg/dL — ABNORMAL HIGH (ref 0.40–1.20)
GFR: 39.7 mL/min — ABNORMAL LOW (ref >60.00)
Glucose, Bld: 506 mg/dL (ref 70–99)
Potassium: 4.4 mEq/L (ref 3.5–5.1)
Sodium: 136 mEq/L (ref 135–145)

## 2021-02-10 LAB — TSH: TSH: 0.07 u[IU]/mL — ABNORMAL LOW (ref 0.35–4.50)

## 2021-02-10 LAB — T4, FREE: Free T4: 1.15 ng/dL (ref 0.60–1.60)

## 2021-02-10 NOTE — Telephone Encounter (Signed)
Inbound call from the lab advising pt's glucose was 506. Blood was collected 02/10/21 9:50 am

## 2021-02-10 NOTE — Progress Notes (Signed)
Subjective:    Patient ID: Danielle Schroeder, female    DOB: 12/17/1955, 65 y.o.   MRN: [id number]  HPI Pt returns for f/u of GH-producing pituitary adenoma (dx'ed at uncertain time, but was prob approx 1990; she was she took some injections for this x a few mos, but she has been on no rx since then; she never had pituitary surgery; NS recommended XRT, which she had 8/21-9/21).  F/u MRI is sched for 12/23/20.  sandostatin LAR is approved through pt assistance.   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   GH: high.  Prolactin: normal, even with dilution.   FSH/LH: low ACTH/cortisol: random are normal.   VP: low (with normal Na+) ASUB: normal.  mfgr agreed to pay all but $900 per month of Sandostatin-LAR.  Pt does not know if she has medicare part D.   She also has small MNG (dx'ed 2020; no nodule needed bx; plan is for f/u US).   She takes Vit-D, 1000 units/d.   I do not manage her DM, but pt says she has not recently taken her insulin Past Medical History:  Diagnosis Date  . Cataract    bilateral lens implants  . CHF (congestive heart failure) ([place])   . Diabetes mellitus without complication ([place])   . Hyperlipidemia   . Hypertension   . Pituitary adenoma with extrasellar extension ([place])   . Sleep apnea    no c-pap  . Thyroid disease    hypothyroidism    Past Surgical History:  Procedure Laterality Date  . BREAST SURGERY    . CHOLECYSTECTOMY    . TONSILLECTOMY    . TUBAL LIGATION      Social History   Socioeconomic History  . Marital status: Single    Spouse name: Not on file  . Number of children: Not on file  . Years of education: Not on file  . Highest education level: Not on file  Occupational History  . Not on file  Tobacco Use  . Smoking status: Never Smoker  . Smokeless tobacco: Never Used  Substance and Sexual Activity  . Alcohol use: Never  . Drug use: Never  . Sexual activity: Not Currently   Other Topics Concern  . Not on file  Social History Narrative  . Not on file   Social Determinants of Health   Financial Resource Strain: Not on file  Food Insecurity: Not on file  Transportation Needs: Not on file  Physical Activity: Not on file  Stress: Not on file  Social Connections: Not on file  Intimate Partner Violence: Not on file    Current Outpatient Medications on File Prior to Visit  Medication Sig Dispense Refill  . amLODipine (NORVASC) 10 MG tablet Take 5 mg by mouth daily.     Marland Kitchen aspirin EC 81 MG tablet Take 81 mg by mouth daily.    . carvedilol (COREG) 25 MG tablet Take 25 mg by mouth 2 (two) times daily with a meal.    . furosemide (LASIX) 40 MG tablet TAKE 1 TABLET BY MOUTH TWICE A DAY 180 tablet 3  . glucose blood test strip 1 each by Other route 3 (three) times daily. Use as instructed    . ibuprofen (ADVIL) 800 MG tablet Take 800 mg by mouth every 6 (six) hours.    . Insulin Glargine (BASAGLAR KWIKPEN [place]) Inject into the skin. 35 units BID    . Insulin  Pen Needle (PEN NEEDLES) 31G X 5 MM MISC 1 each by Does not apply route daily.    Marland Kitchen levothyroxine (SYNTHROID) 75 MCG tablet Take 1 tablet (75 mcg total) by mouth daily before breakfast. 30 tablet 3  . losartan (COZAAR) 100 MG tablet Take 100 mg by mouth daily.    . metFORMIN (GLUCOPHAGE) 1000 MG tablet Take 1,000 mg by mouth 2 (two) times daily with a meal.    . metolazone (ZAROXOLYN) 5 MG tablet Take 5 mg by mouth daily.    Marland Kitchen octreotide (SANDOSTATIN LAR DEPOT) 20 MG injection Inject 20 mg into the muscle every 28 (twenty-eight) days. 1 each 0  . potassium chloride (KLOR-CON) 10 MEQ tablet TAKE 1 TABLET BY MOUTH EVERY DAY 90 tablet 3  . simvastatin (ZOCOR) 20 MG tablet Take 20 mg by mouth daily.    . TRADJENTA 5 MG TABS tablet Take 5 mg by mouth daily.    . Vitamin D, Cholecalciferol, 25 MCG (1000 UT) CAPS Take 2 capsules by mouth daily. 60 capsule 0   No current facility-administered medications on file prior  to visit.    No Known Allergies  Family History  Problem Relation Age of Onset  . Thyroid disease Neg Hx   . Colon cancer Neg Hx   . Esophageal cancer Neg Hx   . Rectal cancer Neg Hx   . Stomach cancer Neg Hx     BP (!) 160/92   Pulse 86   Ht 5\' 7"  (1.702 m)   Wt 211 lb (95.7 kg)   SpO2 94%   BMI 33.05 kg/m   Review of Systems     Objective:   Physical Exam VITAL SIGNS:  See vs page GENERAL: no distress NECK: There is no palpable thyroid enlargement.  No thyroid nodule is palpable.  No palpable lymphadenopathy at the anterior neck.    Glucose =500 Lab Results  Component Value Date   TSH 0.07 (L) 02/10/2021      Assessment & Plan:  Insulin-requiring type 2 DM.  Resume insulin.  F/u with primary care provider [facility] excess. We discussed.  I offered ref tertiary center. Central hypothyroidism: well-replaced.  Please continue the same synthroid

## 2021-02-10 NOTE — Telephone Encounter (Signed)
Called and left a detailed message for pt on her alternative contact Rolan Lipa [phone] requesting the pt call us back to discuss her labs and insulin regimen.

## 2021-02-10 NOTE — Patient Instructions (Addendum)
Your blood pressure is high today.  Please see your primary care provider soon, to have it rechecked Blood tests are requested for you today.  We'll let you know about the results.   Your options are to talk to your insurance, to see if they will pay, or for [place] to refer you to a large medical center.  Please let us know.   Blood tests are requested for you today.  We'll let you know about the results.    Please come back for a follow-up appointment in 2 months, if you decide to continue to follow up here.

## 2021-02-10 NOTE — Telephone Encounter (Signed)
please contact patient: Blood sugar is really high.  Are you missing the insulin?

## 2021-02-11 ENCOUNTER — Telehealth: Payer: Self-pay

## 2021-02-11 NOTE — Telephone Encounter (Signed)
NO VM set up

## 2021-02-11 NOTE — Telephone Encounter (Signed)
Yes, please f/u DM with primary care provider.

## 2021-02-11 NOTE — Telephone Encounter (Signed)
Pt returned call to advise she had not been taking her insulin for the past week and she is going to pick some up today. Pt advised to start back taking her insulin immediately and monitor her sugars for the next few days and if her sugars do not come down she needs to call us back. Any additional instructions?

## 2021-02-11 NOTE — Telephone Encounter (Signed)
Pt called back again regarding a separate phone call regarding medication. Pt advised she was returning another call that was made to her 'yesterday or a few days ago'.  While attempting to find documentation from call pt hung up the phone before the call could be concluded.

## 2021-02-14 ENCOUNTER — Telehealth: Payer: Self-pay

## 2021-02-14 NOTE — Telephone Encounter (Signed)
LVM for pt to cb to the office with her questoins/concerns with some medications

## 2021-02-14 NOTE — Telephone Encounter (Signed)
Patient called back regarding medications and critical lab results.  Requests a call back to [phone]

## 2021-02-14 NOTE — Telephone Encounter (Signed)
Pt called with questions regarding medication Dr Loanne Drilling wants her to start and requested to be called at [phone].

## 2021-02-14 NOTE — Telephone Encounter (Signed)
Called pt regarding critical results and pt had questions about medications being prescribed and patient assistance previously discussed with Dr Loanne Drilling. Message routed to Dr Cordelia Pen assistant to follow up.

## 2021-02-21 ENCOUNTER — Telehealth: Payer: Self-pay | Admitting: Endocrinology

## 2021-02-21 NOTE — Telephone Encounter (Signed)
Spoke with daughter Phineas Real regarding her mother and trying to find some kind of pt assistance program that could her cover cost of tumor

## 2021-02-21 NOTE — Telephone Encounter (Signed)
Hey are you aware of a treatment for this pt?

## 2021-02-21 NOTE — Telephone Encounter (Signed)
I don't know the answer, but I would be happy to refer you there--just let me know.

## 2021-02-21 NOTE — Telephone Encounter (Signed)
We discussed pt assist, but that is still very expensive for her.

## 2021-02-21 NOTE — Telephone Encounter (Signed)
Spoke with pt's daughter Phineas Real and she stated that you and Sharece spoke about getting some kind of help with her Tumor whether there was a program that will help cover the cost or something to that nature. Are you aware of speaking on this in her last OV?

## 2021-02-21 NOTE — Telephone Encounter (Signed)
Pt called because she recently talked to Dr about once a month treatments at [facility] and has a few questions to ask:  -Will insurance cover this treatment?  -Will she have to go to [facility] or [place] for these treatments?   -do we have contact information she can give her daughter for this treatment place/where they have to go or will they contact her for the appt?  Please call daughter Phineas Real with these answers at [phone].

## 2021-04-05 ENCOUNTER — Other Ambulatory Visit: Payer: Self-pay | Admitting: Endocrinology

## 2021-04-12 ENCOUNTER — Ambulatory Visit (INDEPENDENT_AMBULATORY_CARE_PROVIDER_SITE_OTHER): Payer: Medicare HMO | Admitting: Endocrinology

## 2021-04-12 ENCOUNTER — Other Ambulatory Visit: Payer: Self-pay

## 2021-04-12 VITALS — BP 142/80 | HR 83 | Ht 67.0 in | Wt 213.4 lb

## 2021-04-12 DIAGNOSIS — E220 Acromegaly and pituitary gigantism: Secondary | ICD-10-CM

## 2021-04-12 DIAGNOSIS — E049 Nontoxic goiter, unspecified: Secondary | ICD-10-CM

## 2021-04-12 DIAGNOSIS — E559 Vitamin D deficiency, unspecified: Secondary | ICD-10-CM

## 2021-04-12 LAB — T4, FREE: Free T4: 0.92 ng/dL (ref 0.60–1.60)

## 2021-04-12 LAB — BASIC METABOLIC PANEL
BUN: 19 mg/dL (ref 6–23)
CO2: 29 mEq/L (ref 19–32)
Calcium: 10.6 mg/dL — ABNORMAL HIGH (ref 8.4–10.5)
Chloride: 99 mEq/L (ref 96–112)
Creatinine, Ser: 1.31 mg/dL — ABNORMAL HIGH (ref 0.40–1.20)
GFR: 43.31 mL/min — ABNORMAL LOW (ref >60.00)
Glucose, Bld: 119 mg/dL — ABNORMAL HIGH (ref 70–99)
Potassium: 4.1 mEq/L (ref 3.5–5.1)
Sodium: 140 mEq/L (ref 135–145)

## 2021-04-12 LAB — TSH: TSH: 0.08 u[IU]/mL — ABNORMAL LOW (ref 0.35–4.50)

## 2021-04-12 LAB — LUTEINIZING HORMONE: LH: 0.22 m[IU]/mL

## 2021-04-12 LAB — FOLLICLE STIMULATING HORMONE: FSH: 0.6 m[IU]/mL

## 2021-04-12 LAB — VITAMIN D 25 HYDROXY (VIT D DEFICIENCY, FRACTURES): VITD: 39.08 ng/mL (ref 30.00–100.00)

## 2021-04-12 LAB — CORTISOL: Cortisol, Plasma: 12.3 ug/dL

## 2021-04-12 NOTE — Patient Instructions (Addendum)
Your blood pressure is high today.  Please see your primary care provider soon, to have it rechecked.   Blood tests are requested for you today.  We'll let you know about the results.   Please see a specialist at a large medical center.  you will receive a phone call, about a day and time for an appointment.    Please come back for a follow-up appointment in 2 months, if you decide to continue to follow up here.

## 2021-04-12 NOTE — Progress Notes (Signed)
Subjective:    Patient ID: Danielle Schroeder, female    DOB: December 12, 1955, 65 y.o.   MRN: [id number]  HPI Pt returns for f/u of GH-producing pituitary adenoma (dx'ed at uncertain time, but was prob approx 1990; she was she took some injections for this x a few mos, but she has been on no rx since then; she never had pituitary surgery; NS recommended XRT, which she had 8/21-9/21; F/u MRI in 2/22 was unchanged).   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   GH: high.  Prolactin: normal, even with dilution.    FSH/LH: low ACTH/cortisol: random are normal.   VP: low (with normal Na+) ASUB: normal.  mfgr agreed to pay all but $900 per month of Sandostatin-LAR.  Pt does not know if she has medicare part D.   She also has small MNG (dx'ed 2020; no nodule needed bx; plan is for f/u US).    She takes Vit-D, 2000 units/d.   Past Medical History:  Diagnosis Date  . Cataract    bilateral lens implants  . CHF (congestive heart failure) ([place])   . Diabetes mellitus without complication ([place])   . Hyperlipidemia   . Hypertension   . Pituitary adenoma with extrasellar extension ([place])   . Sleep apnea    no c-pap  . Thyroid disease    hypothyroidism    Past Surgical History:  Procedure Laterality Date  . BREAST SURGERY    . CHOLECYSTECTOMY    . TONSILLECTOMY    . TUBAL LIGATION      Social History   Socioeconomic History  . Marital status: Single    Spouse name: Not on file  . Number of children: Not on file  . Years of education: Not on file  . Highest education level: Not on file  Occupational History  . Not on file  Tobacco Use  . Smoking status: Never Smoker  . Smokeless tobacco: Never Used  Substance and Sexual Activity  . Alcohol use: Never  . Drug use: Never  . Sexual activity: Not Currently  Other Topics Concern  . Not on file  Social History Narrative  . Not on file   Social Determinants of Health    Financial Resource Strain: Not on file  Food Insecurity: Not on file  Transportation Needs: Not on file  Physical Activity: Not on file  Stress: Not on file  Social Connections: Not on file  Intimate Partner Violence: Not on file    Current Outpatient Medications on File Prior to Visit  Medication Sig Dispense Refill  . amLODipine (NORVASC) 10 MG tablet Take 5 mg by mouth daily.     Marland Kitchen aspirin EC 81 MG tablet Take 81 mg by mouth daily.    . carvedilol (COREG) 25 MG tablet Take 25 mg by mouth 2 (two) times daily with a meal.    . furosemide (LASIX) 40 MG tablet TAKE 1 TABLET BY MOUTH TWICE A DAY 180 tablet 3  . glucose blood test strip 1 each by Other route 3 (three) times daily. Use as instructed    . ibuprofen (ADVIL) 800 MG tablet Take 800 mg by mouth every 6 (six) hours.    . Insulin Glargine (BASAGLAR KWIKPEN [place]) Inject into the skin. 35 units BID    . Insulin Pen Needle (PEN NEEDLES) 31G X 5 MM MISC 1 each by Does not apply route daily.    Marland Kitchen levothyroxine (SYNTHROID)  75 MCG tablet TAKE 1 TABLET BY MOUTH DAILY BEFORE BREAKFAST. 90 tablet 1  . levothyroxine (SYNTHROID) 75 MCG tablet TAKE 1 TABLET BY MOUTH DAILY BEFORE BREAKFAST. 90 tablet 1  . losartan (COZAAR) 100 MG tablet Take 100 mg by mouth daily.    . metFORMIN (GLUCOPHAGE) 1000 MG tablet Take 1,000 mg by mouth 2 (two) times daily with a meal.    . metolazone (ZAROXOLYN) 5 MG tablet Take 5 mg by mouth daily.    Marland Kitchen octreotide (SANDOSTATIN LAR [place]) 20 MG injection Inject 20 mg into the muscle every 28 (twenty-eight) days. 1 each 0  . potassium chloride (KLOR-CON) 10 MEQ tablet TAKE 1 TABLET BY MOUTH EVERY DAY 90 tablet 3  . simvastatin (ZOCOR) 20 MG tablet Take 20 mg by mouth daily.    . TRADJENTA 5 MG TABS tablet Take 5 mg by mouth daily.    . Vitamin D, Cholecalciferol, 25 MCG (1000 UT) CAPS Take 2 capsules by mouth daily. 60 capsule 0   No current facility-administered medications on file prior to visit.    No Known  Allergies  Family History  Problem Relation Age of Onset  . Thyroid disease Neg Hx   . Colon cancer Neg Hx   . Esophageal cancer Neg Hx   . Rectal cancer Neg Hx   . Stomach cancer Neg Hx     BP (!) 142/80 (BP Location: Right Arm, Patient Position: Sitting, Cuff Size: Large)   Pulse 83   Ht 5\' 7"  (1.702 m)   Wt 213 lb 6.4 oz (96.8 kg)   SpO2 92%   BMI 33.42 kg/m    Review of Systems     Objective:   Physical Exam VITAL SIGNS:  See vs page GENERAL: no distress NECK: There is no palpable thyroid enlargement.  No thyroid nodule is palpable.  No palpable lymphadenopathy at the anterior neck.   25-OH Vit-D=39    Assessment & Plan:  Vit-D def: well-controlled.  Please continue the same vit D supplement. 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  Please see your primary care provider soon, to have it rechecked.   Blood tests are requested for you today.  We'll let you know about the results.   Please see a specialist at a large medical center.  you will receive a phone call, about a day and time for an appointment.    Please come back for a follow-up appointment in 2 months, if you decide to continue to follow up here.

## 2021-04-15 DIAGNOSIS — E11319 Type 2 diabetes mellitus with unspecified diabetic retinopathy without macular edema: Secondary | ICD-10-CM | POA: Diagnosis not present

## 2021-04-15 DIAGNOSIS — D352 Benign neoplasm of pituitary gland: Secondary | ICD-10-CM | POA: Diagnosis not present

## 2021-04-15 DIAGNOSIS — E119 Type 2 diabetes mellitus without complications: Secondary | ICD-10-CM | POA: Diagnosis not present

## 2021-04-15 DIAGNOSIS — Z719 Counseling, unspecified: Secondary | ICD-10-CM | POA: Diagnosis not present

## 2021-04-15 DIAGNOSIS — N183 Chronic kidney disease, stage 3 unspecified: Secondary | ICD-10-CM | POA: Diagnosis not present

## 2021-04-18 ENCOUNTER — Other Ambulatory Visit: Payer: Self-pay | Admitting: Endocrinology

## 2021-04-18 LAB — INSULIN-LIKE GROWTH FACTOR
IGF-I, LC/MS: 943 ng/mL — ABNORMAL HIGH (ref 41–279)
Z-Score (Female): 5.3 SD — ABNORMAL HIGH (ref ?–2.0)

## 2021-04-18 LAB — GROWTH HORMONE: Growth Hormone: 36.9 ng/mL — ABNORMAL HIGH (ref <=7.1)

## 2021-04-18 LAB — EXTRA SPECIMEN

## 2021-04-18 LAB — ACTH: C206 ACTH: 26 pg/mL (ref 6–50)

## 2021-04-18 MED ORDER — LEVOTHYROXINE SODIUM 88 MCG PO TABS: 88.0000 ug | ORAL_TABLET | Freq: Every day | ORAL | 3 refills | Status: DC

## 2021-04-22 ENCOUNTER — Telehealth: Payer: Self-pay | Admitting: Endocrinology

## 2021-04-22 ENCOUNTER — Ambulatory Visit
Admission: RE | Admit: 2021-04-22 | Discharge: 2021-04-22 | Disposition: A | Discharge status: Home / Self Care | Payer: Medicare HMO | Source: Ambulatory Visit | Attending: Endocrinology | Admitting: Endocrinology

## 2021-04-22 DIAGNOSIS — E049 Nontoxic goiter, unspecified: Secondary | ICD-10-CM

## 2021-04-22 DIAGNOSIS — E042 Nontoxic multinodular goiter: Secondary | ICD-10-CM | POA: Diagnosis not present

## 2021-04-22 NOTE — Telephone Encounter (Signed)
please contact patient: No change.  All you need to do is to recheck in 1-2 years

## 2021-04-28 NOTE — Telephone Encounter (Signed)
Unable to LVM. Mailbox is full

## 2021-06-16 ENCOUNTER — Other Ambulatory Visit: Payer: Self-pay | Admitting: Cardiology

## 2021-06-29 IMAGING — MR MR HEAD WO/W CM
17 series · 48 of 48 positions shown · IV contrast (10 ml multihance)
Comparison: 11/05/2019

CLINICAL DATA: Follow-up pituitary adenoma.  SRS planning.

EXAM:
MRI HEAD WITHOUT AND WITH CONTRAST
TECHNIQUE: Multiplanar, multiecho pulse sequences of the brain and surrounding
structures were obtained without and with intravenous contrast.
CONTRAST:  10mL MULTIHANCE GADOBENATE DIMEGLUMINE 529 MG/ML IV SOLN

[Series 2: DWI · axial · 3.0mm · 1.50mm/px · z∈[-99,+80]mm · 5 of 94 slices shown (1 of 2)]
[im 1/94]
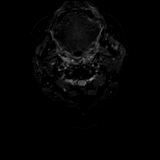
[im 24/94]
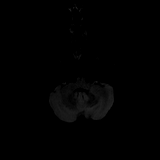
[im 47/94]
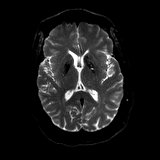
[im 70/94]
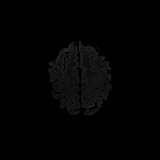
[im 94/94]
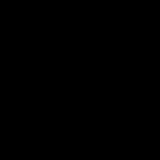

[Series 3: DWI · axial · 3.0mm · 1.50mm/px · z∈[-99,+80]mm · 2 of 47 slices shown (2 of 2)]
[im 1/47]
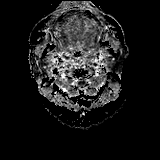
[im 47/47]
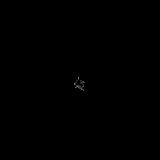

[Series 4: T2 · axial · 5.0mm · 0.57mm/px · 1 of 31 slices shown]
[im 1/31]
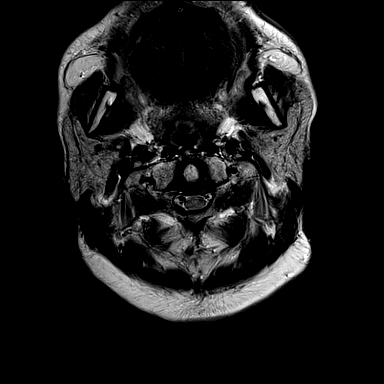

[Series 5: FLAIR · axial · 3.0mm · 0.57mm/px · z∈[-93,+83]mm · 3 of 60 slices shown]
[im 1/60]
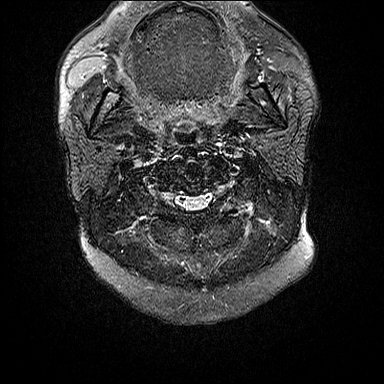
[im 30/60]
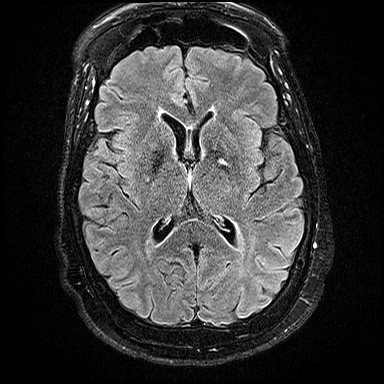
[im 60/60]
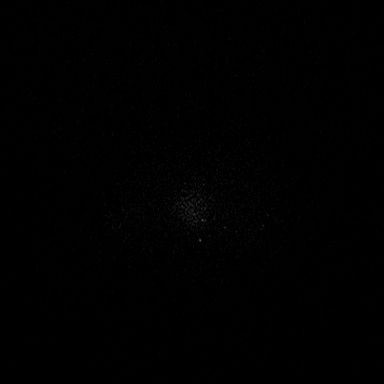

[Series 7: swi_images · axial · 1.5mm · 0.90mm/px · z∈[-90,+77]mm · 6 of 112 slices shown]
[im 1/112]
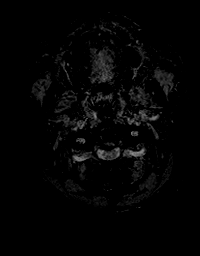
[im 23/112]
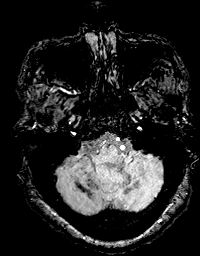
[im 45/112]
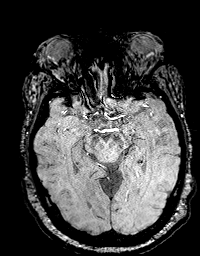
[im 67/112]
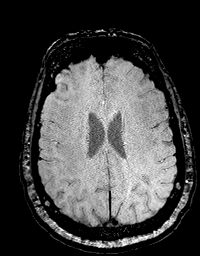
[im 89/112]
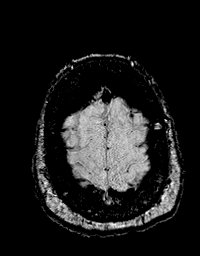
[im 112/112]
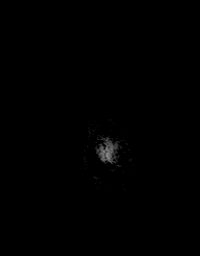

[Series 8: T1 · axial · 1.0mm · 0.75mm/px · z∈[-92,+83]mm · 10 of 176 slices shown (1 of 2)]
[im 1/176]
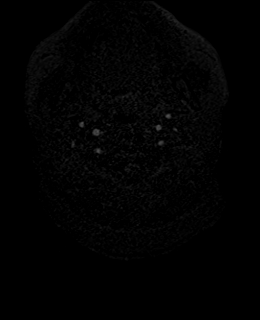
[im 20/176]
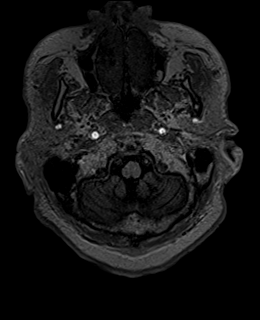
[im 39/176]
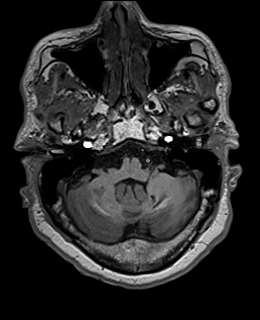
[im 59/176]
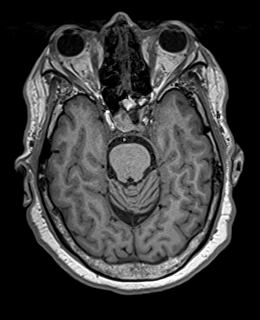
[im 78/176]
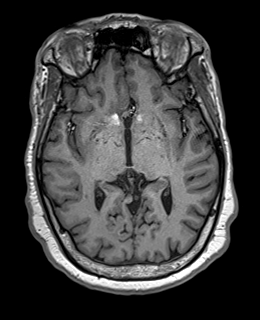
[im 98/176]
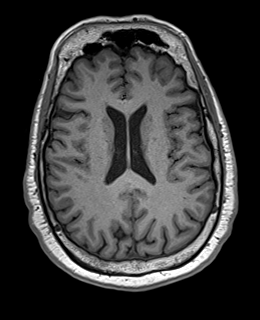
[im 117/176]
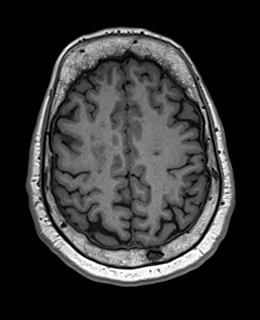
[im 137/176]
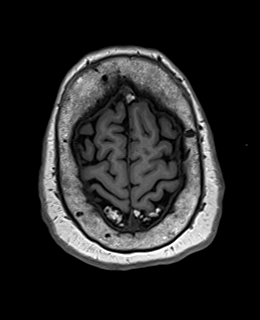
[im 156/176]
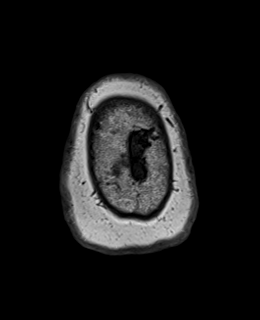
[im 176/176]
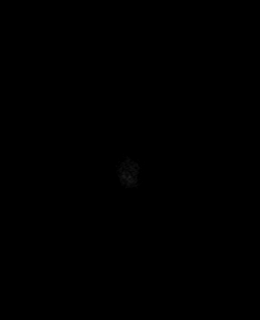

[Series 9: T1 post-contrast · sagittal · 3.0mm · 0.42mm/px · 1 of 13 slices shown (1 of 4)]
[im 1/13]
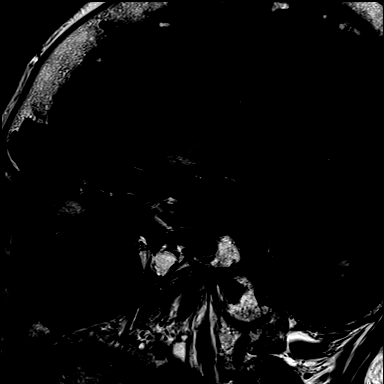

[Series 10: T1 · coronal · non-contrast · 3.0mm · 0.62mm/px · 1 of 9 slices shown (2 of 2)]
[im 1/9]
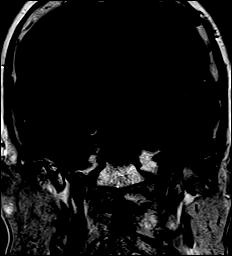

[Series 11: cor post dyn · coronal · 3.0mm · 0.62mm/px · 1 of 9 slices shown (1 of 6)]
[im 1/9]
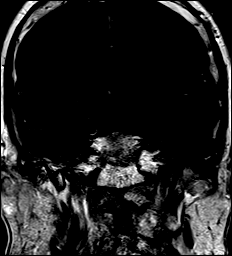

[Series 12: cor post dyn · coronal · 3.0mm · 0.62mm/px · 1 of 9 slices shown (2 of 6)]
[im 1/9]
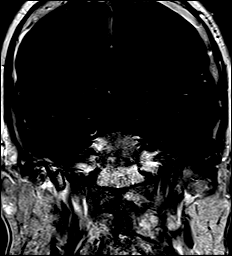

[Series 13: cor post dyn · coronal · 3.0mm · 0.62mm/px · 1 of 9 slices shown (3 of 6)]
[im 1/9]
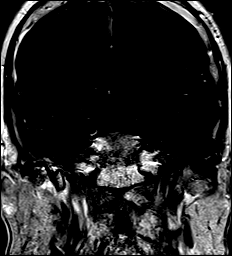

[Series 14: cor post dyn · coronal · 3.0mm · 0.62mm/px · 1 of 9 slices shown (4 of 6)]
[im 1/9]
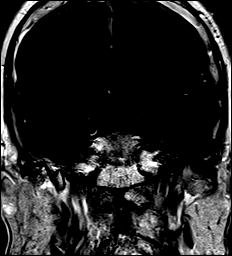

[Series 15: cor post dyn · coronal · 3.0mm · 0.62mm/px · 1 of 9 slices shown (5 of 6)]
[im 1/9]
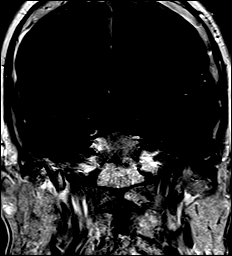

[Series 16: cor post dyn · coronal · 3.0mm · 0.62mm/px · 1 of 9 slices shown (6 of 6)]
[im 1/9]
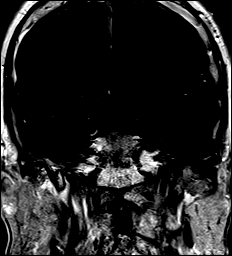

[Series 17: T1 post-contrast · sagittal · 3.0mm · 0.42mm/px · 1 of 13 slices shown (2 of 4)]
[im 1/13]
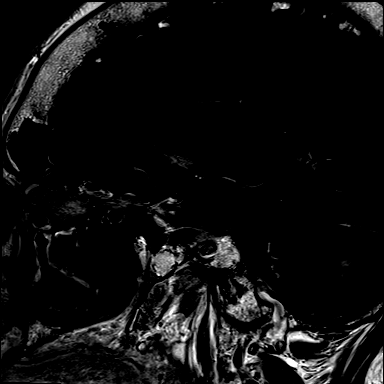

[Series 18: T1 post-contrast · coronal · 3.0mm · 0.42mm/px · 1 of 10 slices shown (3 of 4)]
[im 1/10]
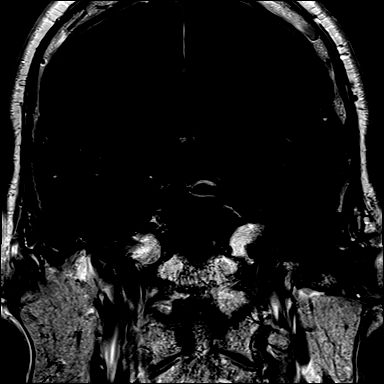

[Series 19: T1 post-contrast · axial · 1.0mm · 0.75mm/px · z∈[-92,+83]mm · 11 of 176 slices shown (4 of 4)]
[im 1/176]
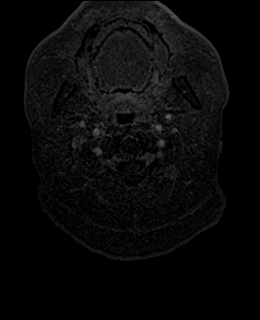
[im 18/176]
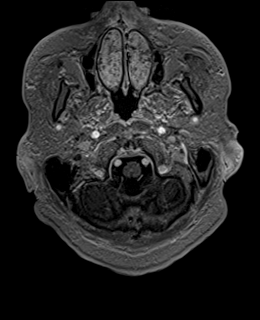
[im 36/176]
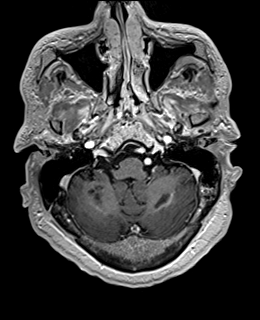
[im 53/176]
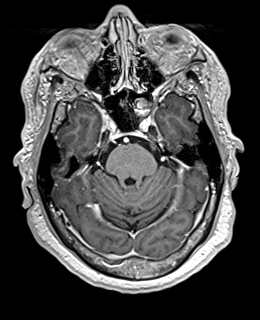
[im 71/176]
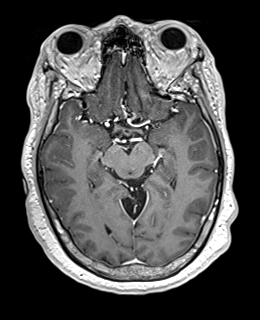
[im 88/176]
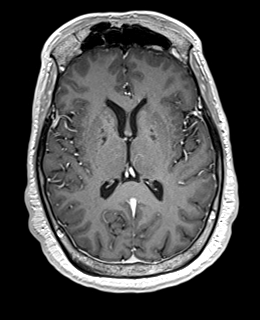
[im 106/176]
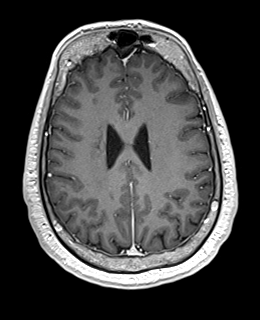
[im 123/176]
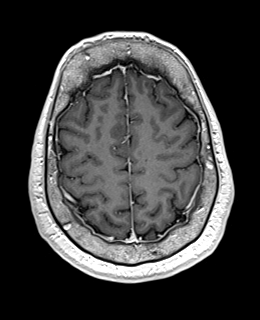
[im 141/176]
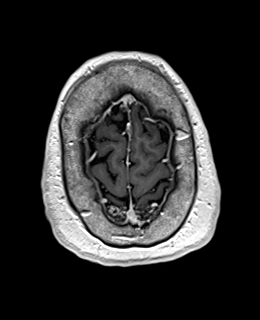
[im 158/176]
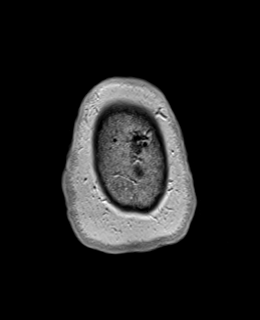
[im 176/176]
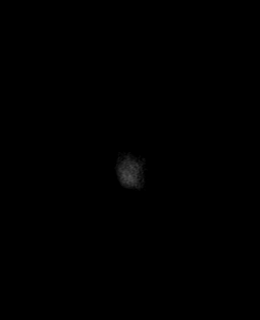

[48 of 48 positions shown; findings below may reference images not displayed]

FINDINGS: Brain: Dedicated pituitary imaging demonstrates a chronically
abnormal appearance of the sella including the anterior sellar
floor, however by history there has been no previous surgery.
Heterogeneously hypoenhancing soft tissue within the midline and
left lateral aspects of the sella is unchanged, measuring 1.8 cm in
transverse diameter and with apparent involvement of the left
cavernous sinus (series 18, image 6). Similarly hypoenhancing soft
tissue extending into the anterior aspect of the suprasellar cistern
measures 2.3 cm in craniocaudal dimension, also unchanged (series
17, image 7). Cystic foci are again noted in the left aspect of the
sella. There is also unchanged hypoenhancing soft tissue extending
through a defect in the anterior sellar floor into the sphenoid
sinus (series 17, image 10). There is unchanged thickening of the
pituitary infundibulum which is deviated rightward (series 8, image
4). The optic chiasm and prechiasmatic optic nerves appear mildly
retracted inferiorly. The right cavernous sinus is unremarkable.

No acute infarct, midline shift, or extra-axial fluid collection is
identified. The ventricles and sulci are normal. T2 hyperintensities
in the cerebral white matter bilaterally are unchanged and
nonspecific but compatible with mild chronic small vessel ischemic
disease. Prominent susceptibility artifact and intrinsic T1
hyperintensity are again seen in the basal ganglia and deep
cerebellar hemispheres bilaterally with at most faint enhancement,
unchanged. Associated T2 hyperintensity/gliosis in both cerebellar
hemispheres is also unchanged.

Vascular: Major intracranial vascular flow voids are preserved.

Skull and upper cervical spine: Unchanged nonspecific 1.2 cm T1 and
T2 hypointense focus in the left parietal bone.

Sinuses/Orbits: Bilateral cataract extraction. Minimal left
maxillary sinus mucosal thickening, decreased from prior. Clear
mastoid air cells.

Other: None.
IMPRESSION: 1. Unchanged residual pituitary adenoma in the sella, anterior
suprasellar cistern, and sphenoid sinus.
2. Unchanged prominent mineralization in the basal ganglia and
cerebellum.

## 2021-07-08 ENCOUNTER — Other Ambulatory Visit: Payer: Self-pay

## 2021-07-08 ENCOUNTER — Ambulatory Visit (INDEPENDENT_AMBULATORY_CARE_PROVIDER_SITE_OTHER): Payer: Medicare HMO | Admitting: Endocrinology

## 2021-07-08 VITALS — BP 130/72 | HR 87 | Ht 67.0 in | Wt 209.2 lb

## 2021-07-08 DIAGNOSIS — E039 Hypothyroidism, unspecified: Secondary | ICD-10-CM | POA: Diagnosis not present

## 2021-07-08 DIAGNOSIS — D352 Benign neoplasm of pituitary gland: Secondary | ICD-10-CM | POA: Diagnosis not present

## 2021-07-08 LAB — BASIC METABOLIC PANEL
BUN: 24 mg/dL — ABNORMAL HIGH (ref 6–23)
CO2: 26 mEq/L (ref 19–32)
Calcium: 10.4 mg/dL (ref 8.4–10.5)
Chloride: 99 mEq/L (ref 96–112)
Creatinine, Ser: 1.39 mg/dL — ABNORMAL HIGH (ref 0.40–1.20)
GFR: 40.27 mL/min — ABNORMAL LOW (ref >60.00)
Glucose, Bld: 71 mg/dL (ref 70–99)
Potassium: 4 mEq/L (ref 3.5–5.1)
Sodium: 138 mEq/L (ref 135–145)

## 2021-07-08 LAB — T4, FREE: Free T4: 1.01 ng/dL (ref 0.60–1.60)

## 2021-07-08 LAB — TSH: TSH: 0.02 u[IU]/mL — ABNORMAL LOW (ref 0.35–5.50)

## 2021-07-08 MED ORDER — OCTREOTIDE ACETATE 50 MCG/ML ~~LOC~~ SOSY: 50.0000 ug | PREFILLED_SYRINGE | Freq: Three times a day (TID) | SUBCUTANEOUS | 11 refills | Status: DC

## 2021-07-08 NOTE — Progress Notes (Signed)
Subjective:    Patient ID: Danielle Schroeder, female    DOB: 02-Dec-1955, 65 y.o.   MRN: [id number]  HPI Pt returns for f/u of GH-producing pituitary adenoma (dx'ed at uncertain time, but was prob approx 1990; she was she took some injections for this x a few mos, but she has been on no rx since then; she never had pituitary surgery; NS recommended XRT, which she had 8/21-9/21; F/u MRI in 2/22 was unchanged).   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   GH: high.  Prolactin: normal, even with dilution.    FSH/LH: low ACTH/cortisol: random are normal.   VP: low (with normal Na+) ASUB: normal.  mfgr agreed to pay all but $900 per month of Sandostatin-LAR.  Pt does not know if she has medicare part D.   She also has small MNG (dx'ed 2020; no nodule needed bx; plan is for f/u US).   She takes Vit-D, 2000 units/d.   Past Medical History:  Diagnosis Date   Cataract    bilateral lens implants   CHF (congestive heart failure) ([facility])    Diabetes mellitus without complication ([facility])    Hyperlipidemia    Hypertension    Pituitary adenoma with extrasellar extension ([place])    Sleep apnea    no c-pap   Thyroid disease    hypothyroidism    Past Surgical History:  Procedure Laterality Date   BREAST SURGERY     CHOLECYSTECTOMY     TONSILLECTOMY     TUBAL LIGATION      Social History   Socioeconomic History   Marital status: Single    Spouse name: Not on file   Number of children: Not on file   Years of education: Not on file   Highest education level: Not on file  Occupational History   Not on file  Tobacco Use   Smoking status: Never   Smokeless tobacco: Never  Substance and Sexual Activity   Alcohol use: Never   Drug use: Never   Sexual activity: Not Currently  Other Topics Concern   Not on file  Social History Narrative   Not on file   Social Determinants of Health   Financial Resource Strain: Not on file  Food  Insecurity: Not on file  Transportation Needs: Not on file  Physical Activity: Not on file  Stress: Not on file  Social Connections: Not on file  Intimate Partner Violence: Not on file    Current Outpatient Medications on File Prior to Visit  Medication Sig Dispense Refill   amLODipine (NORVASC) 10 MG tablet Take 5 mg by mouth daily.      aspirin EC 81 MG tablet Take 81 mg by mouth daily.     carvedilol (COREG) 25 MG tablet Take 25 mg by mouth 2 (two) times daily with a meal.     furosemide (LASIX) 40 MG tablet TAKE 1 TABLET BY MOUTH TWICE A DAY 180 tablet 3   glucose blood test strip 1 each by Other route 3 (three) times daily. Use as instructed     ibuprofen (ADVIL) 800 MG tablet Take 800 mg by mouth every 6 (six) hours.     Insulin Glargine (BASAGLAR KWIKPEN [place]) Inject into the skin. 35 units BID     Insulin Pen Needle (PEN NEEDLES) 31G X 5 MM MISC 1 each by Does not apply route daily.     levothyroxine (SYNTHROID) 88 MCG tablet  Take 1 tablet (88 mcg total) by mouth daily. 90 tablet 3   losartan (COZAAR) 100 MG tablet Take 100 mg by mouth daily.     metFORMIN (GLUCOPHAGE) 1000 MG tablet Take 1,000 mg by mouth 2 (two) times daily with a meal.     metolazone (ZAROXOLYN) 5 MG tablet Take 5 mg by mouth daily.     potassium chloride (KLOR-CON) 10 MEQ tablet TAKE 1 TABLET BY MOUTH EVERY DAY. Call and schedule follow up office visit to receive further refills. Thank you. [phone] 30 tablet 0   simvastatin (ZOCOR) 20 MG tablet Take 20 mg by mouth daily.     TRADJENTA 5 MG TABS tablet Take 5 mg by mouth daily.     Vitamin D, Cholecalciferol, 25 MCG (1000 UT) CAPS Take 2 capsules by mouth daily. 60 capsule 0   No current facility-administered medications on file prior to visit.    No Known Allergies  Family History  Problem Relation Age of Onset   Thyroid disease Neg Hx    Colon cancer Neg Hx    Esophageal cancer Neg Hx    Rectal cancer Neg Hx    Stomach cancer Neg Hx     BP  130/72 (BP Location: Right Arm, Patient Position: Sitting, Cuff Size: Large)   Pulse 87   Ht '5\' 7"'$  (1.702 m)   Wt 209 lb 3.2 oz (94.9 kg)   SpO2 90%   BMI 32.77 kg/m    Review of Systems     Objective:   Physical Exam VITAL SIGNS:  See vs page GENERAL: no distress  Lab Results  Component Value Date   CREATININE 1.39 (H) 07/08/2021   BUN 24 (H) 07/08/2021   NA 138 07/08/2021   K 4.0 07/08/2021   CL 99 07/08/2021   CO2 26 07/08/2021       Assessment & Plan:  Acromegaly, uncontrolled.  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   Patient Instructions  Blood tests are requested for you today.  We'll let you know about the results.   I have sent a prescription to your pharmacy, for an injection for the growth hormone.  If you are able to obtain this, please let us know, so we can show you how to give it.  If you are not able to obtain it, please let us know so we can try to get it from the company.   Please come back for a follow-up appointment in 2 months.

## 2021-07-08 NOTE — Patient Instructions (Addendum)
Blood tests are requested for you today.  We'll let you know about the results.   I have sent a prescription to your pharmacy, for an injection for the growth hormone.  If you are able to obtain this, please let us know, so we can show you how to give it.  If you are not able to obtain it, please let us know so we can try to get it from the company.   Please come back for a follow-up appointment in 2 months.

## 2021-07-14 LAB — GROWTH HORMONE: Growth Hormone: 41.4 ng/mL — ABNORMAL HIGH (ref <=7.1)

## 2021-07-14 LAB — INSULIN-LIKE GROWTH FACTOR
IGF-I, LC/MS: 775 ng/mL — ABNORMAL HIGH (ref 41–279)
Z-Score (Female): 4.7 SD — ABNORMAL HIGH (ref ?–2.0)

## 2021-07-19 DIAGNOSIS — E119 Type 2 diabetes mellitus without complications: Secondary | ICD-10-CM | POA: Diagnosis not present

## 2021-07-19 DIAGNOSIS — E039 Hypothyroidism, unspecified: Secondary | ICD-10-CM | POA: Diagnosis not present

## 2021-07-19 DIAGNOSIS — I10 Essential (primary) hypertension: Secondary | ICD-10-CM | POA: Diagnosis not present

## 2021-07-19 DIAGNOSIS — D352 Benign neoplasm of pituitary gland: Secondary | ICD-10-CM | POA: Diagnosis not present

## 2021-07-20 DIAGNOSIS — I10 Essential (primary) hypertension: Secondary | ICD-10-CM | POA: Diagnosis not present

## 2021-07-20 DIAGNOSIS — N183 Chronic kidney disease, stage 3 unspecified: Secondary | ICD-10-CM | POA: Diagnosis not present

## 2021-07-20 DIAGNOSIS — E559 Vitamin D deficiency, unspecified: Secondary | ICD-10-CM | POA: Diagnosis not present

## 2021-07-20 DIAGNOSIS — I509 Heart failure, unspecified: Secondary | ICD-10-CM | POA: Diagnosis not present

## 2021-07-20 DIAGNOSIS — E039 Hypothyroidism, unspecified: Secondary | ICD-10-CM | POA: Diagnosis not present

## 2021-07-20 DIAGNOSIS — E119 Type 2 diabetes mellitus without complications: Secondary | ICD-10-CM | POA: Diagnosis not present

## 2021-07-20 DIAGNOSIS — E785 Hyperlipidemia, unspecified: Secondary | ICD-10-CM | POA: Diagnosis not present

## 2021-07-21 LAB — LAB REPORT - SCANNED: EGFR: 54

## 2021-07-31 ENCOUNTER — Other Ambulatory Visit: Payer: Self-pay | Admitting: Cardiology

## 2021-08-01 ENCOUNTER — Other Ambulatory Visit: Payer: Self-pay | Admitting: Family Medicine

## 2021-08-01 DIAGNOSIS — Z1231 Encounter for screening mammogram for malignant neoplasm of breast: Secondary | ICD-10-CM

## 2021-08-15 DIAGNOSIS — I739 Peripheral vascular disease, unspecified: Secondary | ICD-10-CM | POA: Diagnosis not present

## 2021-08-15 DIAGNOSIS — L84 Corns and callosities: Secondary | ICD-10-CM | POA: Diagnosis not present

## 2021-08-15 DIAGNOSIS — E1151 Type 2 diabetes mellitus with diabetic peripheral angiopathy without gangrene: Secondary | ICD-10-CM | POA: Diagnosis not present

## 2021-08-15 DIAGNOSIS — L603 Nail dystrophy: Secondary | ICD-10-CM | POA: Diagnosis not present

## 2021-09-05 ENCOUNTER — Other Ambulatory Visit: Payer: Self-pay

## 2021-09-05 ENCOUNTER — Ambulatory Visit
Admission: RE | Admit: 2021-09-05 | Discharge: 2021-09-05 | Disposition: A | Discharge status: Home / Self Care | Payer: Medicare HMO | Source: Ambulatory Visit | Attending: Family Medicine | Admitting: Family Medicine

## 2021-09-05 DIAGNOSIS — Z1231 Encounter for screening mammogram for malignant neoplasm of breast: Secondary | ICD-10-CM

## 2021-09-08 DIAGNOSIS — E1129 Type 2 diabetes mellitus with other diabetic kidney complication: Secondary | ICD-10-CM | POA: Diagnosis not present

## 2021-09-08 DIAGNOSIS — E876 Hypokalemia: Secondary | ICD-10-CM | POA: Diagnosis not present

## 2021-09-08 DIAGNOSIS — E785 Hyperlipidemia, unspecified: Secondary | ICD-10-CM | POA: Diagnosis not present

## 2021-09-08 DIAGNOSIS — N183 Chronic kidney disease, stage 3 unspecified: Secondary | ICD-10-CM | POA: Diagnosis not present

## 2021-09-08 DIAGNOSIS — I129 Hypertensive chronic kidney disease with stage 1 through stage 4 chronic kidney disease, or unspecified chronic kidney disease: Secondary | ICD-10-CM | POA: Diagnosis not present

## 2021-09-08 DIAGNOSIS — E1122 Type 2 diabetes mellitus with diabetic chronic kidney disease: Secondary | ICD-10-CM | POA: Diagnosis not present

## 2021-09-09 ENCOUNTER — Other Ambulatory Visit: Payer: Self-pay

## 2021-09-09 ENCOUNTER — Ambulatory Visit (INDEPENDENT_AMBULATORY_CARE_PROVIDER_SITE_OTHER): Payer: Medicare HMO | Admitting: Endocrinology

## 2021-09-09 VITALS — BP 120/50 | HR 90 | Ht 67.0 in | Wt 224.8 lb

## 2021-09-09 DIAGNOSIS — E220 Acromegaly and pituitary gigantism: Secondary | ICD-10-CM | POA: Diagnosis not present

## 2021-09-09 NOTE — Patient Instructions (Addendum)
The staff will show you how to give the octreotide. Please come back for a follow-up appointment in 1 month.

## 2021-09-09 NOTE — Progress Notes (Signed)
Subjective:    Patient ID: Danielle Schroeder, female    DOB: January 14, 1956, 65 y.o.   MRN: [id number]  HPI Pt returns for f/u of GH-producing pituitary adenoma (dx'ed at uncertain time, but was prob approx 1990; she was she took some injections for this x a few mos, but she has been on no rx since then; she never had pituitary surgery; NS recommended XRT, which she had 8/21-9/21; F/u MRI in 2/22 was unchanged).   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   GH: high.  Prolactin: normal, even with dilution.    FSH/LH: low ACTH/cortisol: random are normal.   VP: low (with normal Na+) ASUB: normal.  SDOH: mfgr agreed to pay all but $50 per month of octreotide, but would not cover Sandostatin-LAR  Pt does not know if she has medicare part D.   She also has small MNG (dx'ed 2020; no nodule needed bx; f/u US in 2022 was unchanged).   She takes Vit-D, 2000 units/d.   Interval Hx: She has obtained sandostatin-LAR, and needs to lean how to give it.  No new sxs.   Past Medical History:  Diagnosis Date   Cataract    bilateral lens implants   CHF (congestive heart failure) ([facility])    Diabetes mellitus without complication ([facility])    Hyperlipidemia    Hypertension    Pituitary adenoma with extrasellar extension ([place])    Sleep apnea    no c-pap   Thyroid disease    hypothyroidism    Past Surgical History:  Procedure Laterality Date   BREAST SURGERY     CHOLECYSTECTOMY     TONSILLECTOMY     TUBAL LIGATION      Social History   Socioeconomic History   Marital status: Single    Spouse name: Not on file   Number of children: Not on file   Years of education: Not on file   Highest education level: Not on file  Occupational History   Not on file  Tobacco Use   Smoking status: Never   Smokeless tobacco: Never  Substance and Sexual Activity   Alcohol use: Never   Drug use: Never   Sexual activity: Not Currently  Other Topics  Concern   Not on file  Social History Narrative   Not on file   Social Determinants of Health   Financial Resource Strain: Not on file  Food Insecurity: Not on file  Transportation Needs: Not on file  Physical Activity: Not on file  Stress: Not on file  Social Connections: Not on file  Intimate Partner Violence: Not on file    Current Outpatient Medications on File Prior to Visit  Medication Sig Dispense Refill   amLODipine (NORVASC) 10 MG tablet Take 5 mg by mouth daily.      aspirin EC 81 MG tablet Take 81 mg by mouth daily.     carvedilol (COREG) 25 MG tablet Take 25 mg by mouth 2 (two) times daily with a meal.     furosemide (LASIX) 40 MG tablet TAKE 1 TABLET BY MOUTH TWICE A DAY 180 tablet 3   glucose blood test strip 1 each by Other route 3 (three) times daily. Use as instructed     ibuprofen (ADVIL) 800 MG tablet Take 800 mg by mouth every 6 (six) hours.     Insulin Glargine (BASAGLAR KWIKPEN [place]) Inject into the skin. 35 units BID  Insulin Pen Needle (PEN NEEDLES) 31G X 5 MM MISC 1 each by Does not apply route daily.     levothyroxine (SYNTHROID) 88 MCG tablet Take 1 tablet (88 mcg total) by mouth daily. 90 tablet 3   losartan (COZAAR) 100 MG tablet Take 100 mg by mouth daily.     metFORMIN (GLUCOPHAGE) 1000 MG tablet Take 1,000 mg by mouth 2 (two) times daily with a meal.     metolazone (ZAROXOLYN) 5 MG tablet Take 5 mg by mouth daily.     Octreotide Acetate 50 MCG/ML SOSY Inject 50 mcg into the skin in the morning, at noon, and at bedtime. 100 mL 11   potassium chloride (KLOR-CON) 10 MEQ tablet TAKE 1 TABLET BY MOUTH EVERY DAY. 90 tablet 3   simvastatin (ZOCOR) 20 MG tablet Take 20 mg by mouth daily.     TRADJENTA 5 MG TABS tablet Take 5 mg by mouth daily.     Vitamin D, Cholecalciferol, 25 MCG (1000 UT) CAPS Take 2 capsules by mouth daily. 60 capsule 0   No current facility-administered medications on file prior to visit.    No Known Allergies  Family History   Problem Relation Age of Onset   Thyroid disease Neg Hx    Colon cancer Neg Hx    Esophageal cancer Neg Hx    Rectal cancer Neg Hx    Stomach cancer Neg Hx     BP (!) 120/50 (BP Location: Right Arm, Patient Position: Sitting, Cuff Size: Large)   Pulse 90   Ht 5\' 7"  (1.702 m)   Wt 224 lb 12.8 oz (102 kg)   SpO2 93%   BMI 35.21 kg/m   Review of Systems     Objective:   Physical Exam      Assessment & Plan:  [place] excess.  Uncontrolled.  We discussed.  Pt is instructed in injections, and tolerates well.   Patient Instructions  The staff will show you how to give the octreotide. Please come back for a follow-up appointment in 1 month.

## 2021-09-15 ENCOUNTER — Ambulatory Visit (INDEPENDENT_AMBULATORY_CARE_PROVIDER_SITE_OTHER): Payer: Medicare HMO | Admitting: Ophthalmology

## 2021-09-15 ENCOUNTER — Other Ambulatory Visit: Payer: Self-pay

## 2021-09-15 ENCOUNTER — Encounter (INDEPENDENT_AMBULATORY_CARE_PROVIDER_SITE_OTHER): Payer: Self-pay | Admitting: Ophthalmology

## 2021-09-15 DIAGNOSIS — E113551 Type 2 diabetes mellitus with stable proliferative diabetic retinopathy, right eye: Secondary | ICD-10-CM

## 2021-09-15 DIAGNOSIS — E113512 Type 2 diabetes mellitus with proliferative diabetic retinopathy with macular edema, left eye: Secondary | ICD-10-CM | POA: Diagnosis not present

## 2021-09-15 DIAGNOSIS — H43822 Vitreomacular adhesion, left eye: Secondary | ICD-10-CM | POA: Diagnosis not present

## 2021-09-15 NOTE — Assessment & Plan Note (Signed)
Clear media no active maculopathy quiescent PDR

## 2021-09-15 NOTE — Assessment & Plan Note (Signed)
Physiologic, minor

## 2021-09-15 NOTE — Progress Notes (Signed)
09/15/2021     CHIEF COMPLAINT Patient presents for  Chief Complaint  Patient presents with   Retina Follow Up      HISTORY OF PRESENT ILLNESS: Danielle Schroeder is a 65 y.o. female who presents to the clinic today for:   HPI     Retina Follow Up   Patient presents with  Diabetic Retinopathy.  In both eyes.  This started 9 months ago.  Duration of 9 months.  Since onset it is stable.      Last edited by Reather Littler, COA on 09/15/2021 10:03 AM.      Referring physician: Vassie Moment, MD No address on file  HISTORICAL INFORMATION:   Selected notes from the MEDICAL RECORD NUMBER    Lab Results  Component Value Date   HGBA1C 8.8 (A) 12/09/2020     CURRENT MEDICATIONS: No current outpatient medications on file. (Ophthalmic Drugs)   No current facility-administered medications for this visit. (Ophthalmic Drugs)   Current Outpatient Medications (Other)  Medication Sig   amLODipine (NORVASC) 10 MG tablet Take 5 mg by mouth daily.    aspirin EC 81 MG tablet Take 81 mg by mouth daily.   carvedilol (COREG) 25 MG tablet Take 25 mg by mouth 2 (two) times daily with a meal.   furosemide (LASIX) 40 MG tablet TAKE 1 TABLET BY MOUTH TWICE A DAY   glucose blood test strip 1 each by Other route 3 (three) times daily. Use as instructed   ibuprofen (ADVIL) 800 MG tablet Take 800 mg by mouth every 6 (six) hours.   Insulin Glargine (BASAGLAR KWIKPEN ) Inject into the skin. 35 units BID   Insulin Pen Needle (PEN NEEDLES) 31G X 5 MM MISC 1 each by Does not apply route daily.   levothyroxine (SYNTHROID) 88 MCG tablet Take 1 tablet (88 mcg total) by mouth daily.   losartan (COZAAR) 100 MG tablet Take 100 mg by mouth daily.   metFORMIN (GLUCOPHAGE) 1000 MG tablet Take 1,000 mg by mouth 2 (two) times daily with a meal.   metolazone (ZAROXOLYN) 5 MG tablet Take 5 mg by mouth daily.   Octreotide Acetate 50 MCG/ML SOSY Inject 50 mcg into the skin in the morning, at noon, and at  bedtime.   potassium chloride (KLOR-CON) 10 MEQ tablet TAKE 1 TABLET BY MOUTH EVERY DAY.   simvastatin (ZOCOR) 20 MG tablet Take 20 mg by mouth daily.   TRADJENTA 5 MG TABS tablet Take 5 mg by mouth daily.   Vitamin D, Cholecalciferol, 25 MCG (1000 UT) CAPS Take 2 capsules by mouth daily.   No current facility-administered medications for this visit. (Other)      REVIEW OF SYSTEMS:    ALLERGIES No Known Allergies  PAST MEDICAL HISTORY Past Medical History:  Diagnosis Date   Cataract    bilateral lens implants   CHF (congestive heart failure) (HCC)    Diabetes mellitus without complication (HCC)    Hyperlipidemia    Hypertension    Pituitary adenoma with extrasellar extension ([place])    Sleep apnea    no c-pap   Thyroid disease    hypothyroidism   Past Surgical History:  Procedure Laterality Date   BREAST SURGERY     CHOLECYSTECTOMY     TONSILLECTOMY     TUBAL LIGATION      FAMILY HISTORY Family History  Problem Relation Age of Onset   Thyroid disease Neg Hx    Colon cancer Neg Hx  Esophageal cancer Neg Hx    Rectal cancer Neg Hx    Stomach cancer Neg Hx     SOCIAL HISTORY Social History   Tobacco Use   Smoking status: Never   Smokeless tobacco: Never  Substance Use Topics   Alcohol use: Never   Drug use: Never         OPHTHALMIC EXAM:  Base Eye Exam     Visual Acuity (ETDRS)       Right Left   Dist [place] 20/50 -2 20/20 -1   Dist ph [place] 20/32 -2          Tonometry (Tonopen, 10:07 AM)       Right Left   Pressure 16 12         Pupils       Pupils Dark Light Shape React APD   Right PERRL 3.5 3 Round Minimal None   Left PERRL 3.5 3 Round Minimal None         Visual Fields (Counting fingers)       Left Right    Full Full         Extraocular Movement       Right Left    Full, Ortho Full, Ortho         Neuro/Psych     Oriented x3: Yes   Mood/Affect: Normal         Dilation     Both eyes: 1.0% Mydriacyl, 2.5%  Phenylephrine @ 10:07 AM           Slit Lamp and Fundus Exam     External Exam       Right Left   External Normal Normal         Slit Lamp Exam       Right Left   Lids/Lashes Normal Normal   Conjunctiva/Sclera White and quiet White and quiet   Cornea Clear Clear   Anterior Chamber Deep and quiet Deep and quiet   Iris Round and reactive Round and reactive   Lens Posterior chamber intraocular lens Posterior chamber intraocular lens   Anterior Vitreous Normal Normal         Fundus Exam       Right Left   Posterior Vitreous Normal Normal   Disc Normal Normal   C/D Ratio 0.3 0.35   Macula no macular thickening, Focal laser scars no macular thickening, Focal laser scars   Vessels PDR quiescent,, PDR quiescent,,   Periphery Good moderate scatter PRP Good scatter PRP, some room nasally for more laser but no active areas of NVE            IMAGING AND PROCEDURES  Imaging and Procedures for 09/15/21  OCT, Retina - OU - Both Eyes       Right Eye Quality was good. Scan locations included subfoveal. Central Foveal Thickness: 239. Findings include abnormal foveal contour, retinal drusen .   Left Eye Quality was good. Scan locations included subfoveal. Central Foveal Thickness: 226. Findings include abnormal foveal contour, vitreomacular adhesion , retinal drusen .   Notes No active maculopathy OU.  We will continue to observe.     Color Fundus Photography Optos - OU - Both Eyes       Right Eye Progression has been stable. Disc findings include normal observations. Macula : normal observations.   Left Eye Progression has been stable. Disc findings include normal observations.   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 Stable treated proliferative diabetic retinopathy of right eye determined by examination associated with type 2 diabetes mellitus (HCC)  E11.3551 OCT, Retina - OU - Both Eyes    Color Fundus Photography Optos - OU - Both Eyes    2. 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 Vitreomacular adhesion, left  H43.822       1.  OU quiescent PDR, no therapy required  2.  3.  Ophthalmic Meds Ordered this visit:  No orders of the defined types were placed in this encounter.      Return in about 9 months (around 06/15/2022) for DILATE OU, COLOR FP, OCT.  There are no Patient Instructions on file for this visit.   Explained the diagnoses, plan, and follow up with the patient and they expressed understanding.  Patient expressed understanding of the importance of proper follow up care.   Clent Demark Abrahim Sargent M.D. Diseases & Surgery of the Retina and Vitreous Retina [facility] Diabetic [place] 09/15/21     Abbreviations: M myopia (nearsighted); A astigmatism; H hyperopia (farsighted); P presbyopia; Mrx spectacle prescription;  CTL contact lenses; OD right eye; OS left eye; OU both eyes  XT exotropia; ET esotropia; PEK punctate epithelial keratitis; PEE punctate epithelial erosions; DES dry eye syndrome; MGD meibomian gland dysfunction; ATs artificial tears; PFAT's preservative free artificial tears; [place] nuclear sclerotic cataract; PSC posterior subcapsular cataract; ERM epi-retinal membrane; PVD posterior vitreous detachment; RD retinal  detachment; DM diabetes mellitus; DR diabetic retinopathy; NPDR non-proliferative diabetic retinopathy; PDR proliferative diabetic retinopathy; CSME clinically significant macular edema; DME diabetic macular edema; dbh dot blot hemorrhages; CWS cotton wool spot; POAG primary open angle glaucoma; C/D cup-to-disc ratio; HVF humphrey visual field; GVF goldmann visual field; OCT optical coherence tomography; IOP intraocular pressure; BRVO Branch retinal vein occlusion; CRVO central retinal vein occlusion; CRAO central retinal artery occlusion; BRAO branch retinal artery occlusion; RT retinal tear; SB scleral buckle; PPV pars plana vitrectomy; VH Vitreous hemorrhage; PRP panretinal laser photocoagulation; IVK intravitreal kenalog; VMT vitreomacular traction; MH Macular hole;  NVD neovascularization of the disc; NVE neovascularization elsewhere; AREDS age related eye disease study; ARMD age related macular degeneration; POAG primary open angle glaucoma; EBMD epithelial/anterior basement membrane dystrophy; ACIOL anterior chamber intraocular lens; IOL intraocular lens; PCIOL posterior chamber intraocular lens; Phaco/IOL phacoemulsification with intraocular lens placement; [place] photorefractive keratectomy; LASIK laser assisted in situ keratomileusis; HTN hypertension; DM diabetes mellitus; COPD chronic obstructive pulmonary disease

## 2021-10-13 ENCOUNTER — Other Ambulatory Visit: Payer: Self-pay

## 2021-10-13 ENCOUNTER — Ambulatory Visit: Payer: Medicare HMO | Admitting: Endocrinology

## 2021-10-13 VITALS — BP 90/60 | HR 84 | Ht 67.0 in | Wt 227.0 lb

## 2021-10-13 DIAGNOSIS — E220 Acromegaly and pituitary gigantism: Secondary | ICD-10-CM | POA: Diagnosis not present

## 2021-10-13 NOTE — Patient Instructions (Addendum)
Please see Danielle Schroeder, to show you how to give the octreotide.   Please come back for a follow-up appointment in 6 weeks.

## 2021-10-13 NOTE — Progress Notes (Signed)
Subjective:    Patient ID: Danielle Schroeder, female    DOB: 1956/10/21, 65 y.o.   MRN: [id number]  HPI Pt returns for f/u of GH-producing pituitary adenoma (dx'ed at uncertain time, but was prob approx 1990; she was she took some injections for this x a few mos, but she has been on no rx since then; she never had pituitary surgery; NS recommended XRT, which she had 8/21-9/21; F/u MRI in 2/22 was unchanged).   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   GH: high.  Prolactin: normal, even with dilution.    FSH/LH: low ACTH/cortisol: random are normal.   VP: low (with normal Na+) ASUB: normal.  SDOH: mfgr agreed to pay all but $50 per month of octreotide, but would not cover Sandostatin-LAR  Pt does not know if she has medicare part D.   She also has small MNG (dx'ed 2020; no nodule needed bx; f/u US in 2022 was unchanged).   She takes Vit-D, 2000 units/d.   Interval Hx: She stopped taking immed-release octreotide, due to pain and bruising at the inject site.  No new sxs.   Past Medical History:  Diagnosis Date   Cataract    bilateral lens implants   CHF (congestive heart failure) (HCC)    Diabetes mellitus without complication (HCC)    Hyperlipidemia    Hypertension    Pituitary adenoma with extrasellar extension ([place])    Sleep apnea    no c-pap   Thyroid disease    hypothyroidism    Past Surgical History:  Procedure Laterality Date   BREAST SURGERY     CHOLECYSTECTOMY     TONSILLECTOMY     TUBAL LIGATION      Social History   Socioeconomic History   Marital status: Single    Spouse name: Not on file   Number of children: Not on file   Years of education: Not on file   Highest education level: Not on file  Occupational History   Not on file  Tobacco Use   Smoking status: Never   Smokeless tobacco: Never  Substance and Sexual Activity   Alcohol use: Never   Drug use: Never   Sexual activity: Not  Currently  Other Topics Concern   Not on file  Social History Narrative   Not on file   Social Determinants of Health   Financial Resource Strain: Not on file  Food Insecurity: Not on file  Transportation Needs: Not on file  Physical Activity: Not on file  Stress: Not on file  Social Connections: Not on file  Intimate Partner Violence: Not on file    Current Outpatient Medications on File Prior to Visit  Medication Sig Dispense Refill   amLODipine (NORVASC) 10 MG tablet Take 5 mg by mouth daily.      aspirin EC 81 MG tablet Take 81 mg by mouth daily.     carvedilol (COREG) 25 MG tablet Take 25 mg by mouth 2 (two) times daily with a meal.     furosemide (LASIX) 40 MG tablet TAKE 1 TABLET BY MOUTH TWICE A DAY 180 tablet 3   glucose blood test strip 1 each by Other route 3 (three) times daily. Use as instructed     ibuprofen (ADVIL) 800 MG tablet Take 800 mg by mouth every 6 (six) hours.     Insulin Glargine (BASAGLAR KWIKPEN [place]) Inject into the skin. 35 units BID  Insulin Pen Needle (PEN NEEDLES) 31G X 5 MM MISC 1 each by Does not apply route daily.     levothyroxine (SYNTHROID) 88 MCG tablet Take 1 tablet (88 mcg total) by mouth daily. 90 tablet 3   losartan (COZAAR) 100 MG tablet Take 100 mg by mouth daily.     metFORMIN (GLUCOPHAGE) 1000 MG tablet Take 1,000 mg by mouth 2 (two) times daily with a meal.     metolazone (ZAROXOLYN) 5 MG tablet Take 5 mg by mouth daily.     Octreotide Acetate 50 MCG/ML SOSY Inject 50 mcg into the skin in the morning, at noon, and at bedtime. 100 mL 11   potassium chloride (KLOR-CON) 10 MEQ tablet TAKE 1 TABLET BY MOUTH EVERY DAY. 90 tablet 3   simvastatin (ZOCOR) 20 MG tablet Take 20 mg by mouth daily.     TRADJENTA 5 MG TABS tablet Take 5 mg by mouth daily.     Vitamin D, Cholecalciferol, 25 MCG (1000 UT) CAPS Take 2 capsules by mouth daily. 60 capsule 0   No current facility-administered medications on file prior to visit.    No Known  Allergies  Family History  Problem Relation Age of Onset   Thyroid disease Neg Hx    Colon cancer Neg Hx    Esophageal cancer Neg Hx    Rectal cancer Neg Hx    Stomach cancer Neg Hx     BP 90/60   Pulse 84   Ht 5\' 7"  (1.702 m)   Wt 227 lb (103 kg)   SpO2 94%   BMI 35.55 kg/m    Review of Systems Denies headache.      Objective:   Physical Exam      Assessment & Plan:  [place] excess: uncontrolled Medical device issue.  Patient Instructions  Please see Vaughan Basta, to show you how to give the octreotide.   Please come back for a follow-up appointment in 6 weeks.

## 2021-11-18 ENCOUNTER — Other Ambulatory Visit (HOSPITAL_COMMUNITY): Payer: Self-pay

## 2021-11-18 ENCOUNTER — Telehealth: Payer: Self-pay

## 2021-11-18 NOTE — Telephone Encounter (Signed)
Patient Advocate Encounter   Received notification from [facility] that prior authorization for Octreotide Acetate is required by his/her insurance [employer].   PA submitted on 11/17/21  Key#: [id number]   Status is pending    [facility] will continue to follow:  Patient Advocate Fax: [phone]

## 2021-11-21 ENCOUNTER — Other Ambulatory Visit: Payer: Self-pay | Admitting: Endocrinology

## 2021-11-21 ENCOUNTER — Other Ambulatory Visit (HOSPITAL_COMMUNITY): Payer: Self-pay

## 2021-11-21 MED ORDER — OCTREOTIDE ACETATE 50 MCG/ML ~~LOC~~ SOSY
50.0000 ug | PREFILLED_SYRINGE | Freq: Three times a day (TID) | SUBCUTANEOUS | 11 refills | Status: DC
  Filled 2021-11-21: qty 100, 33d supply, fill #0

## 2021-11-21 NOTE — Telephone Encounter (Signed)
Patient Advocate Encounter  Prior Authorization for Danielle Schroeder has been approved.    PA#  PA Case ID: [id number] Effective dates: 11/13/21 through 11/12/22  Per Test Claim Patients co-pay is $0.   Spoke with Pharmacy ([facility]) to Process. They do not have it in stock and would not process the claim to make sure it would go through. Test claim does go through here, if the pt would like to get it from one of the [employer].   Called pt to see if she'd like to get it filled with [place]. No answer and the Mail box is full.   Patient Advocate Fax:  [phone]

## 2021-11-22 ENCOUNTER — Ambulatory Visit: Payer: Medicare HMO | Admitting: Endocrinology

## 2021-11-22 ENCOUNTER — Other Ambulatory Visit (HOSPITAL_COMMUNITY): Payer: Self-pay

## 2021-11-22 ENCOUNTER — Other Ambulatory Visit: Payer: Self-pay

## 2021-11-22 ENCOUNTER — Ambulatory Visit: Payer: Medicare HMO | Admitting: Nutrition

## 2021-11-22 ENCOUNTER — Encounter: Payer: Medicare HMO | Attending: Endocrinology | Admitting: Nutrition

## 2021-11-22 NOTE — Progress Notes (Signed)
Patient is here o learn how to take her new medication: Octreotiede:50 mg sq.  We discussed how this medication works, how to store this and then how to take this.  She did not wish to do the first injection herself.  We reviewed the steps on the instruction sheet again, and she reported good understanding of this. Her blood pressure was high on admission: 170/90, but after the discussion about this medication, and how to take this, it had dropped to 160/84.  She was encouraged to call her family doctor for this today. She agreed to do this.   The injection was given at 8:20: 50 mg in her upper right abdomen. Pt. Reported no discomfort.  She remained in my office for 20 minutes and reported no symptoms of respiratory distress or discomfort of any kind.  At 8:45 her BP was 160/82, and reported no discomfort or distress.  She had no final questions.

## 2021-11-23 ENCOUNTER — Other Ambulatory Visit: Payer: Self-pay | Admitting: Endocrinology

## 2021-11-23 ENCOUNTER — Other Ambulatory Visit (HOSPITAL_COMMUNITY): Payer: Self-pay

## 2021-12-02 ENCOUNTER — Other Ambulatory Visit (HOSPITAL_COMMUNITY): Payer: Self-pay

## 2021-12-02 ENCOUNTER — Other Ambulatory Visit: Payer: Self-pay | Admitting: Endocrinology

## 2021-12-02 MED ORDER — OCTREOTIDE ACETATE 50 MCG/ML IJ SOLN
50.0000 ug | Freq: Three times a day (TID) | INTRAMUSCULAR | 11 refills | Status: DC
  Filled 2021-12-02: qty 90, 30d supply, fill #0

## 2021-12-05 ENCOUNTER — Other Ambulatory Visit (HOSPITAL_COMMUNITY): Payer: Self-pay

## 2021-12-05 MED ORDER — "SYRINGE/NEEDLE (DISP) 25G X 1"" 3 ML MISC"
11 refills | Status: DC
  Filled 2021-12-05: qty 100, 33d supply, fill #0

## 2021-12-06 ENCOUNTER — Other Ambulatory Visit: Payer: Self-pay

## 2021-12-06 ENCOUNTER — Other Ambulatory Visit (HOSPITAL_COMMUNITY): Payer: Self-pay

## 2021-12-06 ENCOUNTER — Ambulatory Visit (INDEPENDENT_AMBULATORY_CARE_PROVIDER_SITE_OTHER): Payer: Medicare HMO | Admitting: Endocrinology

## 2021-12-06 VITALS — BP 140/82 | HR 78 | Ht 67.0 in | Wt 225.6 lb

## 2021-12-06 DIAGNOSIS — E220 Acromegaly and pituitary gigantism: Secondary | ICD-10-CM | POA: Diagnosis not present

## 2021-12-06 DIAGNOSIS — E039 Hypothyroidism, unspecified: Secondary | ICD-10-CM | POA: Diagnosis not present

## 2021-12-06 LAB — BASIC METABOLIC PANEL
BUN: 25 mg/dL — ABNORMAL HIGH (ref 6–23)
CO2: 31 mEq/L (ref 19–32)
Calcium: 9.8 mg/dL (ref 8.4–10.5)
Chloride: 97 mEq/L (ref 96–112)
Creatinine, Ser: 1.64 mg/dL — ABNORMAL HIGH (ref 0.40–1.20)
GFR: 32.93 mL/min — ABNORMAL LOW (ref >60.00)
Glucose, Bld: 77 mg/dL (ref 70–99)
Potassium: 4.5 mEq/L (ref 3.5–5.1)
Sodium: 137 mEq/L (ref 135–145)

## 2021-12-06 LAB — T4, FREE: Free T4: 0.98 ng/dL (ref 0.60–1.60)

## 2021-12-06 LAB — TSH: TSH: 0.06 u[IU]/mL — ABNORMAL LOW (ref 0.35–5.50)

## 2021-12-06 NOTE — Progress Notes (Signed)
Subjective:    Patient ID: Danielle Schroeder, female    DOB: December 14, 1955, 66 y.o.   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   GH: high.  Prolactin: normal, even with dilution.   FSH/LH: low ACTH/cortisol: random are normal.   VP: low (with normal Na+) ASUB: normal.  SDOH: mfgr agreed to pay all but $50 per month of TID octreotide, but would not cover Sandostatin-LAR  Pt does not know if she has medicare part D.   She also has small MNG (dx'ed 2020; no nodule needed bx; f/u US in 2022 was unchanged).   She takes Vit-D, 2000 units/d.   Interval Hx: She has been on TID octreotide x 2 weeks.  She takes as rx'ed.  No sxs. Denies HA and vis loss.    Past Medical History:  Diagnosis Date   Cataract    bilateral lens implants   CHF (congestive heart failure) (HCC)    Diabetes mellitus without complication (HCC)    Hyperlipidemia    Hypertension    Pituitary adenoma with extrasellar extension ([place])    Sleep apnea    no c-pap   Thyroid disease    hypothyroidism    Past Surgical History:  Procedure Laterality Date   BREAST SURGERY     CHOLECYSTECTOMY     TONSILLECTOMY     TUBAL LIGATION      Social History   Socioeconomic History   Marital status: Single    Spouse name: Not on file   Number of children: Not on file   Years of education: Not on file   Highest education level: Not on file  Occupational History   Not on file  Tobacco Use   Smoking status: Never   Smokeless tobacco: Never  Substance and Sexual Activity   Alcohol use: Never   Drug use: Never   Sexual activity: Not  Currently  Other Topics Concern   Not on file  Social History Narrative   Not on file   Social Determinants of Health   Financial Resource Strain: Not on file  Food Insecurity: Not on file  Transportation Needs: Not on file  Physical Activity: Not on file  Stress: Not on file  Social Connections: Not on file  Intimate Partner Violence: Not on file    Current Outpatient Medications on File Prior to Visit  Medication Sig Dispense Refill   amLODipine (NORVASC) 10 MG tablet Take 5 mg by mouth daily.      aspirin EC 81 MG tablet Take 81 mg by mouth daily.     carvedilol (COREG) 25 MG tablet Take 25 mg by mouth 2 (two) times daily with a meal.     furosemide (LASIX) 40 MG tablet TAKE 1 TABLET BY MOUTH TWICE A DAY 180 tablet 3   glucose blood test strip 1 each by Other route 3 (three) times daily. Use as instructed     ibuprofen (ADVIL) 800 MG tablet Take 800 mg by mouth every 6 (six) hours.     Insulin Glargine (BASAGLAR KWIKPEN [place]) Inject into  the skin. 35 units BID     Insulin Pen Needle (PEN NEEDLES) 31G X 5 MM MISC 1 each by Does not apply route daily.     levothyroxine (SYNTHROID) 88 MCG tablet Take 1 tablet (88 mcg total) by mouth daily. 90 tablet 3   losartan (COZAAR) 100 MG tablet Take 100 mg by mouth daily.     metFORMIN (GLUCOPHAGE) 1000 MG tablet Take 1,000 mg by mouth 2 (two) times daily with a meal.     metolazone (ZAROXOLYN) 5 MG tablet Take 5 mg by mouth daily.     octreotide (SANDOSTATIN) 50 MCG/ML SOLN injection Inject 1 mL (50 mcg total) into the skin 3 times daily. 90 mL 11   potassium chloride (KLOR-CON) 10 MEQ tablet TAKE 1 TABLET BY MOUTH EVERY DAY. 90 tablet 3   simvastatin (ZOCOR) 20 MG tablet Take 20 mg by mouth daily.     SYRINGE-NEEDLE, DISP, 3 ML 25G X 1" 3 ML MISC Inject 1 ml into the skin 3 times daily. 100 each 11   TRADJENTA 5 MG TABS tablet Take 5 mg by mouth daily.     Vitamin D, Cholecalciferol, 25 MCG (1000 UT) CAPS Take 2 capsules by mouth daily. 60  capsule 0   No current facility-administered medications on file prior to visit.    No Known Allergies  Family History  Problem Relation Age of Onset   Thyroid disease Neg Hx    Colon cancer Neg Hx    Esophageal cancer Neg Hx    Rectal cancer Neg Hx    Stomach cancer Neg Hx     BP 140/82    Pulse 78    Ht 5\' 7"  (1.702 m)    Wt 225 lb 9.6 oz (102.3 kg)    SpO2 96%    BMI 35.33 kg/m    Review of Systems Denies N/V.      Objective:   Physical Exam    GH=1.6    Assessment & Plan:  GH excess: well-controlled Pit adenoma: check IGF-1.  If this is good also, we'll recheck MRI Hypothyroidism: recheck TFT today.

## 2021-12-06 NOTE — Patient Instructions (Addendum)
Blood tests are requested for you today.  We'll let you know about the results.  Please come back for a follow-up appointment in 1 month.  

## 2021-12-07 ENCOUNTER — Telehealth: Payer: Self-pay

## 2021-12-07 NOTE — Telephone Encounter (Signed)
Attempted to contact the patient regarding lab results. Unable to LVM as mailbox is currently full.

## 2021-12-07 NOTE — Telephone Encounter (Signed)
-----   Message from Renato Shin, MD sent at 12/06/2021  4:50 PM EST ----- please contact patient: Kidneys are a little worse.  This is not related to what we work on, but you should see your primary care provider, to follow this up.

## 2021-12-07 NOTE — Patient Instructions (Signed)
Take the medication as directed.   Call primary care physician for elevated blood pressure

## 2021-12-12 ENCOUNTER — Other Ambulatory Visit: Payer: Self-pay | Admitting: Endocrinology

## 2021-12-12 LAB — INSULIN-LIKE GROWTH FACTOR
IGF-I, LC/MS: 457 ng/mL — ABNORMAL HIGH (ref 41–279)
Z-Score (Female): 3.2 SD — ABNORMAL HIGH (ref ?–2.0)

## 2021-12-12 LAB — GROWTH HORMONE: Growth Hormone: 1.6 ng/mL (ref <=7.1)

## 2021-12-12 MED ORDER — OCTREOTIDE ACETATE 50 MCG/ML IJ SOLN: 100.0000 ug | Freq: Three times a day (TID) | INTRAMUSCULAR | 3 refills | Status: DC

## 2021-12-13 ENCOUNTER — Telehealth: Payer: Self-pay

## 2021-12-13 NOTE — Telephone Encounter (Signed)
-----   Message from Renato Shin, MD sent at 12/12/2021  6:19 PM EST ----- please contact patient: Growth hormone is improved, but still high.  I have sent a prescription to your pharmacy, to double the octreotide.  Please come back for a follow-up appointment in 1 month.

## 2021-12-13 NOTE — Telephone Encounter (Signed)
Attempted to contact the patient regarding lab results, received no answer. Unable to lvm as mailbox is full.

## 2021-12-14 ENCOUNTER — Other Ambulatory Visit (HOSPITAL_COMMUNITY): Payer: Self-pay

## 2021-12-21 ENCOUNTER — Telehealth: Payer: Self-pay | Admitting: Nutrition

## 2021-12-21 NOTE — Telephone Encounter (Signed)
Patient reports that she is out of her medication and needs a refill.  I told her that DR. Ellison [facility] [address]  She said it came in the mail.  [facility] and [facility] the [facility].  She agreed to do that.

## 2022-01-06 ENCOUNTER — Encounter: Payer: Self-pay | Admitting: Endocrinology

## 2022-01-06 ENCOUNTER — Ambulatory Visit: Payer: Medicare HMO | Admitting: Endocrinology

## 2022-01-06 ENCOUNTER — Other Ambulatory Visit: Payer: Self-pay

## 2022-01-06 VITALS — BP 160/84 | HR 87 | Ht 67.0 in | Wt 229.8 lb

## 2022-01-06 DIAGNOSIS — D352 Benign neoplasm of pituitary gland: Secondary | ICD-10-CM

## 2022-01-06 NOTE — Progress Notes (Signed)
Subjective:    Patient ID: Danielle Schroeder, female    DOB: Jul 17, 1956, 66 y.o.   MRN: [id number]  HPI Pt returns for f/u of GH-producing pituitary adenoma (dx'ed at uncertain time, but was prob approx 1990; pt says she took some injections for this x a few mos, but she has been on no rx since then until 2023; she never had pituitary surgery; NS recommended XRT, which she had 8/21-9/21; F/u MRI in 2/22 was unchanged).   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   GH: high.  Prolactin: normal, even with dilution.   FSH/LH: low ACTH/cortisol: random are normal.   VP: low (with normal Na+) ASUB: normal.  SDOH: mfgr agreed to pay all but $50 per month of TID octreotide, but would not cover Sandostatin-LAR  Pt does not know if she has medicare part D.   She also has small MNG (dx'ed 2020; no nodule needed bx; f/u US in 2022 was unchanged).   She takes Vit-D, 2000 units/d.   Interval Hx: No sxs. Specifically, she denies HA, N/V, and vis loss.  She requests to hear about results via [place] mail.  Pt says she gets the octreotide in prefilled syringes, but she does not know the dosage.  In particular, she does not know if she is on an increased dosage since last ov here.  Pt says she takes as rx'ed.   Past Medical History:  Diagnosis Date   Cataract    bilateral lens implants   CHF (congestive heart failure) (HCC)    Diabetes mellitus without complication (HCC)    Hyperlipidemia    Hypertension    Pituitary adenoma with extrasellar extension ([place])    Sleep apnea    no c-pap   Thyroid disease    hypothyroidism    Past Surgical History:  Procedure Laterality Date   BREAST SURGERY     CHOLECYSTECTOMY     TONSILLECTOMY     TUBAL LIGATION      Social History   Socioeconomic History   Marital status: Single    Spouse name: Not on file   Number of children: Not on file   Years of education: Not on file   Highest education level:  Not on file  Occupational History   Not on file  Tobacco Use   Smoking status: Never   Smokeless tobacco: Never  Substance and Sexual Activity   Alcohol use: Never   Drug use: Never   Sexual activity: Not Currently  Other Topics Concern   Not on file  Social History Narrative   Not on file   Social Determinants of Health   Financial Resource Strain: Not on file  Food Insecurity: Not on file  Transportation Needs: Not on file  Physical Activity: Not on file  Stress: Not on file  Social Connections: Not on file  Intimate Partner Violence: Not on file    Current Outpatient Medications on File Prior to Visit  Medication Sig Dispense Refill   amLODipine (NORVASC) 10 MG tablet Take 5 mg by mouth daily.      aspirin EC 81 MG tablet Take 81 mg by mouth daily.     carvedilol (COREG) 25 MG tablet Take 25 mg by mouth 2 (two) times daily with a meal.     furosemide (LASIX) 40 MG tablet TAKE 1 TABLET BY MOUTH TWICE A DAY 180 tablet 3   glucose blood test strip 1 each  by Other route 3 (three) times daily. Use as instructed     ibuprofen (ADVIL) 800 MG tablet Take 800 mg by mouth every 6 (six) hours.     Insulin Glargine (BASAGLAR KWIKPEN [place]) Inject into the skin. 35 units BID     Insulin Pen Needle (PEN NEEDLES) 31G X 5 MM MISC 1 each by Does not apply route daily.     levothyroxine (SYNTHROID) 88 MCG tablet Take 1 tablet (88 mcg total) by mouth daily. 90 tablet 3   losartan (COZAAR) 100 MG tablet Take 100 mg by mouth daily.     metFORMIN (GLUCOPHAGE) 1000 MG tablet Take 1,000 mg by mouth 2 (two) times daily with a meal.     metolazone (ZAROXOLYN) 5 MG tablet Take 5 mg by mouth daily.     octreotide (SANDOSTATIN) 50 MCG/ML SOLN injection Inject 2 mLs (100 mcg total) into the skin 3 (three) times daily. 540 mL 3   potassium chloride (KLOR-CON) 10 MEQ tablet TAKE 1 TABLET BY MOUTH EVERY DAY. 90 tablet 3   simvastatin (ZOCOR) 20 MG tablet Take 20 mg by mouth daily.     SYRINGE-NEEDLE, DISP,  3 ML 25G X 1" 3 ML MISC Inject 1 ml into the skin 3 times daily. 100 each 11   TRADJENTA 5 MG TABS tablet Take 5 mg by mouth daily.     Vitamin D, Cholecalciferol, 25 MCG (1000 UT) CAPS Take 2 capsules by mouth daily. 60 capsule 0   No current facility-administered medications on file prior to visit.    No Known Allergies  Family History  Problem Relation Age of Onset   Thyroid disease Neg Hx    Colon cancer Neg Hx    Esophageal cancer Neg Hx    Rectal cancer Neg Hx    Stomach cancer Neg Hx     BP (!) 160/84 (BP Location: Right Arm, Patient Position: Sitting, Cuff Size: Normal)    Pulse 87    Ht 5\' 7"  (1.702 m)    Wt 229 lb 12.8 oz (104.2 kg)    SpO2 94%    BMI 35.99 kg/m    Review of Systems     Objective:   Physical Exam VITAL SIGNS:  See vs page GENERAL: no distress FACE: acromegalic features.   EXT: 1+ bilat leg edema.        Assessment & Plan:  [place] excess: due for recheck Medication uncertainty: we discussed.  I am uncertain about the process by which she gets her meds.  See below.  Patient Instructions  Blood tests are requested for you today.  We'll let you know about the results.   It is important to call the pharmacy for a refill when you get low on the octreotide syringes.  Please come back for a follow-up appointment in 1 month.  Please bring a syringe with you, and any paperwork that come with it.

## 2022-01-06 NOTE — Patient Instructions (Signed)
Blood tests are requested for you today.  We'll let you know about the results.   It is important to call the pharmacy for a refill when you get low on the octreotide syringes.  Please come back for a follow-up appointment in 1 month.  Please bring a syringe with you, and any paperwork that come with it.

## 2022-01-13 ENCOUNTER — Other Ambulatory Visit: Payer: Self-pay | Admitting: Endocrinology

## 2022-01-13 LAB — INSULIN-LIKE GROWTH FACTOR
IGF-I, LC/MS: 335 ng/mL — ABNORMAL HIGH (ref 41–279)
Z-Score (Female): 2.4 SD — ABNORMAL HIGH (ref ?–2.0)

## 2022-01-13 LAB — GROWTH HORMONE: Growth Hormone: 1.6 ng/mL (ref <=7.1)

## 2022-01-13 MED ORDER — OCTREOTIDE ACETATE 50 MCG/ML IJ SOLN: 200.0000 ug | Freq: Three times a day (TID) | INTRAMUSCULAR | 3 refills | Status: DC

## 2022-01-16 ENCOUNTER — Telehealth: Payer: Self-pay

## 2022-01-16 NOTE — Telephone Encounter (Signed)
-----   Message from Renato Shin, MD sent at 01/13/2022  3:35 PM EST ----- ?please contact patient: ?Blood tests are improved, but still high.  I have sent a prescription to your pharmacy, to double the octreotide.  I'll see you next time.  Also, you should still consider seeing a specialist at [facility], or [facility].  Please let me know if this is OK with you. ?

## 2022-01-16 NOTE — Telephone Encounter (Signed)
Attempted to contact patient regarding results, Voicemail box is full.  ?

## 2022-02-03 ENCOUNTER — Encounter: Payer: Self-pay | Admitting: Endocrinology

## 2022-02-03 ENCOUNTER — Other Ambulatory Visit: Payer: Self-pay

## 2022-02-03 ENCOUNTER — Ambulatory Visit (INDEPENDENT_AMBULATORY_CARE_PROVIDER_SITE_OTHER): Payer: Medicare HMO | Admitting: Endocrinology

## 2022-02-03 VITALS — BP 144/90 | HR 83 | Ht 67.0 in | Wt 233.6 lb

## 2022-02-03 DIAGNOSIS — E039 Hypothyroidism, unspecified: Secondary | ICD-10-CM

## 2022-02-03 MED ORDER — BASAGLAR KWIKPEN 100 UNIT/ML ~~LOC~~ SOPN: 20.0000 [IU] | PEN_INJECTOR | Freq: Two times a day (BID) | SUBCUTANEOUS | 3 refills | Status: DC

## 2022-02-03 MED ORDER — OCTREOTIDE ACETATE 100 MCG/ML ~~LOC~~ SOSY: 100.0000 ug | PREFILLED_SYRINGE | Freq: Three times a day (TID) | SUBCUTANEOUS | 11 refills | Status: DC

## 2022-02-03 NOTE — Patient Instructions (Addendum)
I have sent a prescription to your pharmacy, to double the octreotide.   ?Please also reduce the Basaglar to 20 units, twice a day. ?Please see your primary care provider soon, to follow up the diabetes.   ?Please come back for a follow-up appointment in 1 month.  Please again bring a syringe with you, and any paperwork that comes with it.  ?

## 2022-02-03 NOTE — Progress Notes (Signed)
? ?Subjective:  ? ? Patient ID: Danielle Schroeder, female    DOB: 1956/09/10, 66 y.o.   MRN: [id number] ? ?HPI ?Pt returns for f/u of GH-producing pituitary adenoma (dx'ed at uncertain time, but was prob approx 1990; pt says she took some injections for this x a few mos, but she has been on no rx since then until 2023; she never had pituitary surgery; NS recommended XRT, which she had 8/21-9/21; F/u MRI in 2/22 was unchanged).   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   ?GH: high.  ?Prolactin: normal, even with dilution.   ?FSH/LH: low ?ACTH/cortisol: random are normal.   ?VP: low (with normal Na+) ?ASUB: normal.  ?SDOH: mfgr agreed to pay all but $50 per month of TID octreotide, but would not cover Sandostatin-LAR  Pt does not know if she has medicare part D.  She requests results by [place] mail. ?She also has small MNG (dx'ed 2020; no nodule needed bx; f/u US in 2022 was unchanged).   ?She takes Vit-D, 2000 units/d.   ?Interval Hx: Pt brings octreotide prefilled syringe.  It is 50 mcg/1 cc.  She takes the contents of 1 syringe TID. She says this causes hypoglycemia (50's), so she sometimes takes just BID.   ?Past Medical History:  ?Diagnosis Date  ? Cataract   ? bilateral lens implants  ? CHF (congestive heart failure) ([place])   ? Diabetes mellitus without complication ([place])   ? Hyperlipidemia   ? Hypertension   ? 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 Sleep apnea   ? no c-pap  ? Thyroid disease   ? hypothyroidism  ? ? ?Past Surgical History:  ?Procedure Laterality Date  ? BREAST SURGERY    ? CHOLECYSTECTOMY    ? TONSILLECTOMY    ? TUBAL LIGATION    ? ? ?Social History  ? ?Socioeconomic History  ? Marital status: Single  ?  Spouse name: Not on file  ? Number of children: Not on file  ? Years of education: Not on file  ? Highest education level: Not on file  ?Occupational History  ? Not on file  ?Tobacco Use  ? Smoking status: Never  ? Smokeless  tobacco: Never  ?Substance and Sexual Activity  ? Alcohol use: Never  ? Drug use: Never  ? Sexual activity: Not Currently  ?Other Topics Concern  ? Not on file  ?Social History Narrative  ? Not on file  ? ?Social Determinants of Health  ? ?Financial Resource Strain: Not on file  ?Food Insecurity: Not on file  ?Transportation Needs: Not on file  ?Physical Activity: Not on file  ?Stress: Not on file  ?Social Connections: Not on file  ?Intimate Partner Violence: Not on file  ? ? ?Current Outpatient Medications on File Prior to Visit  ?Medication Sig Dispense Refill  ? amLODipine (NORVASC) 10 MG tablet Take 5 mg by mouth daily.     ? aspirin EC 81 MG tablet Take 81 mg by mouth daily.    ? carvedilol (COREG) 25 MG tablet Take 25 mg by mouth 2 (two) times daily with a meal.    ? furosemide (LASIX) 40 MG tablet TAKE 1 TABLET BY MOUTH TWICE A DAY 180 tablet 3  ? glucose blood test strip 1 each by Other route 3 (three) times daily. Use as instructed    ? ibuprofen (ADVIL) 800 MG tablet Take 800 mg by mouth every  6 (six) hours.    ? Insulin Pen Needle (PEN NEEDLES) 31G X 5 MM MISC 1 each by Does not apply route daily.    ? levothyroxine (SYNTHROID) 88 MCG tablet Take 1 tablet (88 mcg total) by mouth daily. 90 tablet 3  ? losartan (COZAAR) 100 MG tablet Take 100 mg by mouth daily.    ? metFORMIN (GLUCOPHAGE) 1000 MG tablet Take 1,000 mg by mouth 2 (two) times daily with a meal.    ? metolazone (ZAROXOLYN) 5 MG tablet Take 5 mg by mouth daily.    ? octreotide (SANDOSTATIN) 50 MCG/ML SOLN injection Inject 4 mLs (200 mcg total) into the skin 3 (three) times daily. 1080 mL 3  ? potassium chloride (KLOR-CON) 10 MEQ tablet TAKE 1 TABLET BY MOUTH EVERY DAY. 90 tablet 3  ? simvastatin (ZOCOR) 20 MG tablet Take 20 mg by mouth daily.    ? SYRINGE-NEEDLE, DISP, 3 ML 25G X 1" 3 ML MISC Inject 1 ml into the skin 3 times daily. 100 each 11  ? TRADJENTA 5 MG TABS tablet Take 5 mg by mouth daily.    ? Vitamin D, Cholecalciferol, 25 MCG (1000  UT) CAPS Take 2 capsules by mouth daily. 60 capsule 0  ? ?No current facility-administered medications on file prior to visit.  ? ? ?No Known Allergies ? ?Family History  ?Problem Relation Age of Onset  ? Thyroid disease Neg Hx   ? Colon cancer Neg Hx   ? Esophageal cancer Neg Hx   ? Rectal cancer Neg Hx   ? Stomach cancer Neg Hx   ? ? ?BP (!) 144/90 (BP Location: Left Arm, Patient Position: Sitting, Cuff Size: Normal)   Pulse 83   Ht '5\' 7"'$  (1.702 m)   Wt 233 lb 9.6 oz (106 kg)   SpO2 92%   BMI 36.59 kg/m?  ? ? ? ? ?Review of Systems ?Denies headache.   ?   ?Objective:  ? Physical Exam ?VITAL SIGNS:  See vs page ?GENERAL: no distress ? ? ? ?   ?Assessment & Plan:  ?Hypoglycemia, due to octreotide. ?GH-producing pituitary adenoma.  Plan will be to recheck MRI when labs are well-controlled.  ? ?Patient Instructions  ?I have sent a prescription to your pharmacy, to double the octreotide.   ?Please also reduce the Basaglar to 20 units, twice a day. ?Please see your primary care provider soon, to follow up the diabetes.   ?Please come back for a follow-up appointment in 1 month.  Please again bring a syringe with you, and any paperwork that comes with it.  ? ? ?

## 2022-03-16 DIAGNOSIS — I739 Peripheral vascular disease, unspecified: Secondary | ICD-10-CM | POA: Diagnosis not present

## 2022-03-16 DIAGNOSIS — E1151 Type 2 diabetes mellitus with diabetic peripheral angiopathy without gangrene: Secondary | ICD-10-CM | POA: Diagnosis not present

## 2022-03-20 ENCOUNTER — Ambulatory Visit (INDEPENDENT_AMBULATORY_CARE_PROVIDER_SITE_OTHER): Payer: Medicare HMO | Admitting: Endocrinology

## 2022-03-20 VITALS — BP 134/82 | HR 85 | Ht 67.0 in | Wt 239.2 lb

## 2022-03-20 DIAGNOSIS — E039 Hypothyroidism, unspecified: Secondary | ICD-10-CM | POA: Diagnosis not present

## 2022-03-20 DIAGNOSIS — D352 Benign neoplasm of pituitary gland: Secondary | ICD-10-CM

## 2022-03-20 LAB — BASIC METABOLIC PANEL
BUN: 23 mg/dL (ref 6–23)
CO2: 28 mEq/L (ref 19–32)
Calcium: 9.1 mg/dL (ref 8.4–10.5)
Chloride: 98 mEq/L (ref 96–112)
Creatinine, Ser: 1.53 mg/dL — ABNORMAL HIGH (ref 0.40–1.20)
GFR: 35.72 mL/min — ABNORMAL LOW (ref >60.00)
Glucose, Bld: 126 mg/dL — ABNORMAL HIGH (ref 70–99)
Potassium: 4.2 mEq/L (ref 3.5–5.1)
Sodium: 133 mEq/L — ABNORMAL LOW (ref 135–145)

## 2022-03-20 NOTE — Progress Notes (Signed)
? ?Subjective:  ? ? Patient ID: Danielle Schroeder, female    DOB: May 22, 1956, 65 y.o.   MRN: [id number] ? ?HPI ?Pt returns for f/u of GH-producing pituitary adenoma (dx'ed at uncertain time, but was prob approx 1990; pt says she took some injections for this x a few mos, but she has been on no rx since then until 2023; she never had pituitary surgery; NS recommended XRT, which she had 8/21-9/21; F/u MRI in 2/22 was unchanged).   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   ?GH: high.  ?Prolactin: normal, even with dilution.   ?FSH/LH: low ?ACTH/cortisol: random are normal.   ?VP: low (with normal Na+) ?ASUB: normal.  ?SDOH: mfgr agreed to pay all but $50 per month of TID octreotide, but would not cover Sandostatin-LAR  Pt does not know if she has medicare part D.  She requests results by [place] mail. ?She also has small MNG (dx'ed 2020; no nodule needed bx; f/u US in 2022 was unchanged).   ?She takes Vit-D, 2000 units/d.   ?Interval Hx: Pt brings octreotide prefilled syringe.  she takes octreotide, 100 mcg (1 ml) TID.   ?Past Medical History:  ?Diagnosis Date  ? Cataract   ? bilateral lens implants  ? CHF (congestive heart failure) ([place])   ? Diabetes mellitus without complication ([place])   ? Hyperlipidemia   ? Hypertension   ? Pituitary adenoma with extrasellar extension ([place])   ? Sleep apnea   ? no c-pap  ? Thyroid disease   ? hypothyroidism  ? ? ?Past Surgical History:  ?Procedure Laterality Date  ? BREAST SURGERY    ? CHOLECYSTECTOMY    ? TONSILLECTOMY    ? TUBAL LIGATION    ? ? ?Social History  ? ?Socioeconomic History  ? Marital status: Single  ?  Spouse name: Not on file  ? Number of children: Not on file  ? Years of education: Not on file  ? Highest education level: Not on file  ?Occupational History  ? Not on file  ?Tobacco Use  ? Smoking status: Never  ? Smokeless tobacco: Never  ?Substance and Sexual Activity  ? Alcohol use: Never  ? Drug use: Never  ?  Sexual activity: Not Currently  ?Other Topics Concern  ? Not on file  ?Social History Narrative  ? Not on file  ? ?Social Determinants of Health  ? ?Financial Resource Strain: Not on file  ?Food Insecurity: Not on file  ?Transportation Needs: Not on file  ?Physical Activity: Not on file  ?Stress: Not on file  ?Social Connections: Not on file  ?Intimate Partner Violence: Not on file  ? ? ?Current Outpatient Medications on File Prior to Visit  ?Medication Sig Dispense Refill  ? amLODipine (NORVASC) 10 MG tablet Take 5 mg by mouth daily.     ? aspirin EC 81 MG tablet Take 81 mg by mouth daily.    ? carvedilol (COREG) 25 MG tablet Take 25 mg by mouth 2 (two) times daily with a meal.    ? furosemide (LASIX) 40 MG tablet TAKE 1 TABLET BY MOUTH TWICE A DAY 180 tablet 3  ? glucose blood test strip 1 each by Other route 3 (three) times daily. Use as instructed    ? ibuprofen (ADVIL) 800 MG tablet Take 800 mg by mouth every 6 (six) hours.    ? Insulin Glargine (BASAGLAR KWIKPEN) 100 UNIT/ML Inject 20 Units into the  skin 2 (two) times daily. 45 mL 3  ? Insulin Pen Needle (PEN NEEDLES) 31G X 5 MM MISC 1 each by Does not apply route daily.    ? levothyroxine (SYNTHROID) 88 MCG tablet Take 1 tablet (88 mcg total) by mouth daily. 90 tablet 3  ? losartan (COZAAR) 100 MG tablet Take 100 mg by mouth daily.    ? metFORMIN (GLUCOPHAGE) 1000 MG tablet Take 1,000 mg by mouth 2 (two) times daily with a meal.    ? metolazone (ZAROXOLYN) 5 MG tablet Take 5 mg by mouth daily.    ? Octreotide Acetate 100 MCG/ML [place] Inject 1 mL (100 mcg total) into the skin 3 (three) times daily. 100 mL 11  ? potassium chloride (KLOR-CON) 10 MEQ tablet TAKE 1 TABLET BY MOUTH EVERY DAY. 90 tablet 3  ? simvastatin (ZOCOR) 20 MG tablet Take 20 mg by mouth daily.    ? SYRINGE-NEEDLE, DISP, 3 ML 25G X 1" 3 ML MISC Inject 1 ml into the skin 3 times daily. 100 each 11  ? TRADJENTA 5 MG TABS tablet Take 5 mg by mouth daily.    ? Vitamin D, Cholecalciferol, 25 MCG  (1000 UT) CAPS Take 2 capsules by mouth daily. 60 capsule 0  ? ?No current facility-administered medications on file prior to visit.  ? ? ?No Known Allergies ? ?Family History  ?Problem Relation Age of Onset  ? Thyroid disease Neg Hx   ? Colon cancer Neg Hx   ? Esophageal cancer Neg Hx   ? Rectal cancer Neg Hx   ? Stomach cancer Neg Hx   ? ? ?BP 134/82 (BP Location: Left Arm, Patient Position: Sitting, Cuff Size: Normal)   Pulse 85   Ht '5\' 7"'$  (1.702 m)   Wt 239 lb 3.2 oz (108.5 kg)   SpO2 98%   BMI 37.46 kg/m?  ? ? ?Review of Systems ?Denies HA.  ?   ?Objective:  ? Physical Exam ?VITAL SIGNS:  See vs page ?GENERAL: no distress ? ? ?Lab Results  ?Component Value Date  ? TSH 0.08 (L) 03/20/2022  ?Free T4=1.06 ? ?   ?Assessment & Plan:  ?Central hypothyroidism: well-controlled.  Please continue the same Synthroid.   ?GH-producing pit adenoma.   ? ?Patient Instructions  ?Blood tests are requested for you today.  We'll let you know about the results.   ?Let's recheck the MRI.  you will receive a phone call, about a day and time for an appointment.  ?Please see your primary care provider soon, to follow up the diabetes.  ?You should have an endocrinology follow-up appointment in 2 months.  Please again bring a syringe with you, and any paperwork that comes with it.  ? ? ?

## 2022-03-20 NOTE — Patient Instructions (Addendum)
Blood tests are requested for you today.  We'll let you know about the results.   ?Let's recheck the MRI.  you will receive a phone call, about a day and time for an appointment.  ?Please see your primary care provider soon, to follow up the diabetes.  ?You should have an endocrinology follow-up appointment in 2 months.  Please again bring a syringe with you, and any paperwork that comes with it.  ?

## 2022-03-21 LAB — TSH: TSH: 0.08 u[IU]/mL — ABNORMAL LOW (ref 0.35–5.50)

## 2022-03-21 LAB — T4, FREE: Free T4: 1.06 ng/dL (ref 0.60–1.60)

## 2022-03-25 LAB — INSULIN-LIKE GROWTH FACTOR
IGF-I, LC/MS: 350 ng/mL — ABNORMAL HIGH (ref 41–279)
Z-Score (Female): 2.5 SD — ABNORMAL HIGH (ref ?–2.0)

## 2022-03-25 LAB — GROWTH HORMONE: Growth Hormone: 1.2 ng/mL (ref <=7.1)

## 2022-04-04 ENCOUNTER — Other Ambulatory Visit: Payer: Self-pay | Admitting: Endocrinology

## 2022-04-26 DIAGNOSIS — H269 Unspecified cataract: Secondary | ICD-10-CM | POA: Insufficient documentation

## 2022-04-26 DIAGNOSIS — E1140 Type 2 diabetes mellitus with diabetic neuropathy, unspecified: Secondary | ICD-10-CM | POA: Insufficient documentation

## 2022-04-26 DIAGNOSIS — E236 Other disorders of pituitary gland: Secondary | ICD-10-CM | POA: Insufficient documentation

## 2022-04-26 DIAGNOSIS — I10 Essential (primary) hypertension: Secondary | ICD-10-CM | POA: Insufficient documentation

## 2022-04-28 DIAGNOSIS — E559 Vitamin D deficiency, unspecified: Secondary | ICD-10-CM | POA: Diagnosis not present

## 2022-04-28 DIAGNOSIS — E113553 Type 2 diabetes mellitus with stable proliferative diabetic retinopathy, bilateral: Secondary | ICD-10-CM | POA: Diagnosis not present

## 2022-04-28 DIAGNOSIS — E039 Hypothyroidism, unspecified: Secondary | ICD-10-CM | POA: Diagnosis not present

## 2022-04-28 DIAGNOSIS — E782 Mixed hyperlipidemia: Secondary | ICD-10-CM | POA: Diagnosis not present

## 2022-04-28 DIAGNOSIS — N1831 Chronic kidney disease, stage 3a: Secondary | ICD-10-CM | POA: Diagnosis not present

## 2022-05-02 ENCOUNTER — Encounter: Payer: Self-pay | Admitting: Internal Medicine

## 2022-05-02 ENCOUNTER — Ambulatory Visit (INDEPENDENT_AMBULATORY_CARE_PROVIDER_SITE_OTHER): Payer: Medicare HMO | Admitting: Internal Medicine

## 2022-05-02 VITALS — BP 140/80 | HR 89 | Ht 67.0 in | Wt 234.0 lb

## 2022-05-02 DIAGNOSIS — E042 Nontoxic multinodular goiter: Secondary | ICD-10-CM | POA: Diagnosis not present

## 2022-05-02 DIAGNOSIS — N1832 Chronic kidney disease, stage 3b: Secondary | ICD-10-CM | POA: Diagnosis not present

## 2022-05-02 DIAGNOSIS — E1165 Type 2 diabetes mellitus with hyperglycemia: Secondary | ICD-10-CM

## 2022-05-02 DIAGNOSIS — Z794 Long term (current) use of insulin: Secondary | ICD-10-CM | POA: Diagnosis not present

## 2022-05-02 DIAGNOSIS — E1122 Type 2 diabetes mellitus with diabetic chronic kidney disease: Secondary | ICD-10-CM

## 2022-05-02 DIAGNOSIS — E220 Acromegaly and pituitary gigantism: Secondary | ICD-10-CM | POA: Diagnosis not present

## 2022-05-02 DIAGNOSIS — E039 Hypothyroidism, unspecified: Secondary | ICD-10-CM

## 2022-05-02 DIAGNOSIS — E119 Type 2 diabetes mellitus without complications: Secondary | ICD-10-CM | POA: Insufficient documentation

## 2022-05-02 DIAGNOSIS — D352 Benign neoplasm of pituitary gland: Secondary | ICD-10-CM | POA: Diagnosis not present

## 2022-05-02 LAB — POCT GLYCOSYLATED HEMOGLOBIN (HGB A1C): Hemoglobin A1C: 8.2 % — AB (ref 4.0–5.6)

## 2022-05-02 NOTE — Progress Notes (Signed)
Name: Danielle Schroeder  MRN/ DOB: [id number], 1956-04-16    Age/ Sex: 66 y.o., female     PCP: Joycelyn Man, FNP   Reason for Endocrinology Evaluation: Pituitary adenoma      Initial Endocrinology Clinic Visit: 10/06/2019    PATIENT IDENTIFIER: [name] is a 66 y.o., female with a past medical history of T2DM, dyslipidemia, pituitary adenoma, acromegaly, central hypothyroidism. She has followed with [facility] since 10/06/2019 for consultative assistance with management of her pituitary adenoma.   HISTORICAL SUMMARY:  Per patient she believes she had pituitary adenoma for >40 yrs. she does endorse history of subcutaneous injections in the past which sound like octreotide.  She was seen by Dr. Venetia Constable in 2020 for evaluation of pituitary adenoma. 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 She was deemed not a surgical candidate due to high risk for complication She is S/P radiation treatment in 2021 , not a candidate for surgical intervention    DM HISTORY : She was diagnosed with DM since the age of 6 units. She has been on insulin for years. Her A1c ranges from 8.2 % in 2023 to 8.8% in 2022  THYROID HISTORY: She has been diagnosed with thyroid disease in 2010 and has been on LT-for replacement She was also diagnosed with multinodular goiter in December 2020 based on thyroid ultrasound, no history of FNA   No FH of thyroid disease  Has Fh of DM  Denies FH of pituitary disease   SUBJECTIVE:     Today (05/02/2022):  Ms. [name] is here for Acromegaly , hypothyroidism and T2DM.    She checks glucose once daily , did not bring meter.  She drinks occasional sugar-sweetened beverages  She continues with change in size of hands and feet She has excessive sweating  Denies joint pain  Denies diarrhea , she is  constipated  Denies local neck symptoms  She follows with Nephrology Dr. Johnney Ou     HOME ENDOCRINE MEDICATION  Octreotide 100 mcg TID Levothyroxine 88 mcg daily  Basaglar 20 units BID Metformin 1000 mg BID - has been taken once daily  Tradjenta 5 mg daily      HISTORY:  Past Medical History:  Past Medical History:  Diagnosis Date   Cataract    bilateral lens implants   CHF (congestive heart failure) ([place])    Diabetes mellitus without complication ([place])    Hyperlipidemia    Hypertension    Pituitary adenoma with extrasellar extension ([place])    Sleep apnea    no c-pap   Thyroid disease    hypothyroidism   Past Surgical History:  Past Surgical History:  Procedure Laterality Date   BREAST SURGERY     CHOLECYSTECTOMY     TONSILLECTOMY     TUBAL LIGATION     Social History:  reports that she has never smoked. She has never used smokeless tobacco. She reports that she does not drink alcohol and does not use drugs. Family History:  Family History  Problem Relation Age of Onset   Thyroid disease Neg Hx    Colon cancer Neg Hx    Esophageal cancer Neg Hx    Rectal cancer Neg Hx    Stomach cancer Neg Hx      HOME MEDICATIONS: Allergies as of 05/02/2022   No Known Allergies      Medication  List        Accurate as of May 02, 2022  3:02 PM. If you have any questions, ask your nurse or doctor.          amLODipine 10 MG tablet Commonly known as: NORVASC Take 5 mg by mouth daily.   aspirin EC 81 MG tablet Take 81 mg by mouth daily.   Basaglar KwikPen 100 UNIT/ML Inject 20 Units into the skin 2 (two) times daily.   carvedilol 25 MG tablet Commonly known as: COREG Take 25 mg by mouth 2 (two) times daily with a meal.   furosemide 40 MG tablet Commonly known as: LASIX TAKE 1 TABLET BY MOUTH TWICE A DAY   glucose blood test strip 1 each by Other route 3 (three) times daily. Use as instructed   ibuprofen 800 MG tablet Commonly known as: ADVIL Take  800 mg by mouth every 6 (six) hours.   levothyroxine 88 MCG tablet Commonly known as: SYNTHROID TAKE 1 TABLET BY MOUTH EVERY DAY   losartan 100 MG tablet Commonly known as: COZAAR Take 100 mg by mouth daily.   metFORMIN 1000 MG tablet Commonly known as: GLUCOPHAGE Take 1,000 mg by mouth 2 (two) times daily with a meal.   metolazone 5 MG tablet Commonly known as: ZAROXOLYN Take 5 mg by mouth daily.   Octreotide Acetate 100 MCG/ML Sosy Inject 1 mL (100 mcg total) into the skin 3 (three) times daily.   Pen Needles 31G X 5 MM Misc 1 each by Does not apply route daily.   potassium chloride 10 MEQ tablet Commonly known as: KLOR-CON TAKE 1 TABLET BY MOUTH EVERY DAY.   simvastatin 20 MG tablet Commonly known as: ZOCOR Take 20 mg by mouth daily.   SYRINGE-NEEDLE (DISP) 3 ML 25G X 1" 3 ML Misc Inject 1 ml into the skin 3 times daily.   Tradjenta 5 MG Tabs tablet Generic drug: linagliptin Take 5 mg by mouth daily.   Vitamin D (Cholecalciferol) 25 MCG (1000 UT) Caps Take 2 capsules by mouth daily.          OBJECTIVE:   PHYSICAL EXAM: VS: BP 140/80 (BP Location: Left Arm, Patient Position: Sitting, Cuff Size: Large)   Pulse 89   Ht '5\' 7"'$  (1.702 m)   Wt 234 lb (106.1 kg)   SpO2 95%   BMI 36.65 kg/m    EXAM: General: Pt appears well and is in NAD  Neck: General: Supple without adenopathy. Thyroid: Thyroid size normal.  No goiter or nodules appreciated.   Lungs: Clear with good BS bilat with no rales, rhonchi, or wheezes  Heart: Auscultation: RRR.  Abdomen: Normoactive bowel sounds, soft, nontender, without masses or organomegaly palpable  Extremities:  BL LE: No pretibial edema normal ROM and strength.  Mental Status: Judgment, insight: Intact Orientation: Oriented to time, place, and person Mood and affect: No depression, anxiety, or agitation     DATA REVIEWED:  Latest Reference Range & Units 05/02/22 15:22  Sodium 135 - 145 mEq/L 140  Potassium 3.5 -  5.1 mEq/L 4.5  Chloride 96 - 112 mEq/L 102  CO2 19 - 32 mEq/L 29  Glucose 70 - 99 mg/dL 84  BUN 6 - 23 mg/dL 17  Creatinine 0.40 - 1.20 mg/dL 1.55 (H)  Calcium 8.4 - 10.5 mg/dL 9.7  GFR >60.00 mL/min 35.13 (L)     Latest Reference Range & Units 05/02/22 15:22  C206 ACTH 6 - 50 pg/mL 18  Cortisol, Plasma ug/dL 8.7  Growth Hormone < OR = 7.1 ng/mL 1.3  FSH mIU/ML 0.4  Prolactin ng/mL 3.0  Glucose 70 - 99 mg/dL 84  TSH 0.35 - 5.50 uIU/mL 0.02 (L)  T4,Free(Direct) 0.60 - 1.60 ng/dL 0.99  (L): Data is abnormally low   Brain MRI 12/25/2020   Brain: Today's study suffers from some motion degradation. Diffusion imaging does not show any acute or subacute infarction. Chronic T2 and FLAIR signal with susceptibility signal loss within the deep nuclei of the cerebellar hemispheres and the basal ganglia. No brainstem abnormality. Cerebral hemispheres show chronic small-vessel ischemic changes of the deep white matter. No cortical or large vessel territory infarction. No intra-axial mass lesion, hemorrhage, hydrocephalus or extra-axial collection.   No change in appearance of tissue in the sella and suprasellar region. 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 No evidence of discernible tumor growth.   Vascular: Major vessels at the base of the brain show flow.   Skull and upper cervical spine: Negative   Sinuses/Orbits: Clear/normal   Other: None   IMPRESSION: Motion degraded examination not showing a definite change since the study of 04/29/2020. 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 No convincing progression.       Thyroid ultrasound 04/22/2021   Estimated total number of nodules >/= 1  cm: 1   Number of spongiform nodules >/=  2 cm not described below (TR1): 0   Number of mixed cystic and solid nodules >/= 1.5 cm not described below ([place]): 1   _________________________________________________________   Nodule # 4:   Prior biopsy: No   Location: Left; Mid   Maximum size: 2.0 cm; Other 2 dimensions: 1.4 x 0.9 cm, previously, 1.8 x 1.1 x 1.0 cm   Composition: solid/almost completely solid (2)   Echogenicity: isoechoic (1)   Shape: not taller-than-wide (0)   Margins: ill-defined (0)   Echogenic foci: none (0)   ACR TI-RADS total points: 3.   ACR TI-RADS risk category:  TR3 (3 points).   Significant change in size (>/= 20% in two dimensions and minimal increase of 2 mm): No   Change in features: No   Change in ACR TI-RADS risk category: No   ACR TI-RADS recommendations:   *Given size (>/= 1.5 - 2.4 cm) and appearance, a follow-up ultrasound in 1 year should be considered based on TI-RADS criteria.   _________________________________________________________   Unchanged appearance of the previously visualized multifocal subcentimeter solid and right inferior solid cystic thyroid nodule which again appear benign and do not warrant additional ultrasound follow-up or tissue sampling.   IMPRESSION: 1. Similar appearing multinodular goiter. 2. Unchanged appearance of solid, left mid thyroid nodule (labeled 4, 2.0 cm) which meets criteria (TI-RADS category 3) for 1 year ultrasound follow-up. This study marks 2 years stability.  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  Central hypothyroidism  -Free T4 is normal -No changes today  Medication Continue levothyroxine 88 mcg daily  3. T2DM, poorly controlled with CKDIII complications:  -K8L 2.7%, down from 8.8% -We will stop metformin due to low GFR   Medication Stop metformin Continue Basaglar 20 units twice daily Continue Tradjenta 5 mg daily   Signed electronically by: Mack Guise, MD  [facility]  [facility] [address] [place], [place] [address] Phone: [phone] FAX: [phone]      CC: Joycelyn Man, [address] [place] [address] Phone: [phone]  Fax: [phone]   Return to Endocrinology clinic as below: Future Appointments  Date Time Provider [place]  06/15/2022 10:15 AM Rankin, Clent Demark, MD RDE-RDE None

## 2022-05-02 NOTE — Patient Instructions (Addendum)
STOP Metformin  Continue Trajenta 5 mg , 1 tablet daily  Continue Basaglar 20 units twice a day     HOW TO TREAT LOW BLOOD SUGARS (Blood sugar LESS THAN 70 MG/DL) Please follow the RULE OF 15 for the treatment of hypoglycemia treatment (when your (blood sugars are less than 70 mg/dL)   STEP 1: Take 15 grams of carbohydrates when your blood sugar is low, which includes:  3-4 GLUCOSE TABS  OR 3-4 OZ OF JUICE OR REGULAR SODA OR ONE TUBE OF GLUCOSE GEL    STEP 2: RECHECK blood sugar in 15 MINUTES STEP 3: If your blood sugar is still low at the 15 minute recheck --> then, go back to STEP 1 and treat AGAIN with another 15 grams of carbohydrates.

## 2022-05-03 LAB — BASIC METABOLIC PANEL
BUN: 17 mg/dL (ref 6–23)
CO2: 29 mEq/L (ref 19–32)
Calcium: 9.7 mg/dL (ref 8.4–10.5)
Chloride: 102 mEq/L (ref 96–112)
Creatinine, Ser: 1.55 mg/dL — ABNORMAL HIGH (ref 0.40–1.20)
GFR: 35.13 mL/min — ABNORMAL LOW (ref >60.00)
Glucose, Bld: 84 mg/dL (ref 70–99)
Potassium: 4.5 mEq/L (ref 3.5–5.1)
Sodium: 140 mEq/L (ref 135–145)

## 2022-05-03 LAB — TSH: TSH: 0.02 u[IU]/mL — ABNORMAL LOW (ref 0.35–5.50)

## 2022-05-03 LAB — CORTISOL: Cortisol, Plasma: 8.7 ug/dL

## 2022-05-03 LAB — T4, FREE: Free T4: 0.99 ng/dL (ref 0.60–1.60)

## 2022-05-03 LAB — FOLLICLE STIMULATING HORMONE: FSH: 0.4 m[IU]/mL

## 2022-05-11 ENCOUNTER — Encounter: Payer: Self-pay | Admitting: Internal Medicine

## 2022-05-11 MED ORDER — BASAGLAR KWIKPEN 100 UNIT/ML ~~LOC~~ SOPN: 20.0000 [IU] | PEN_INJECTOR | Freq: Two times a day (BID) | SUBCUTANEOUS | 3 refills | Status: DC

## 2022-05-11 MED ORDER — LINAGLIPTIN 5 MG PO TABS: 5.0000 mg | ORAL_TABLET | Freq: Every day | ORAL | 3 refills | Status: DC

## 2022-05-11 MED ORDER — OCTREOTIDE ACETATE 100 MCG/ML ~~LOC~~ SOSY: 100.0000 ug | PREFILLED_SYRINGE | Freq: Three times a day (TID) | SUBCUTANEOUS | 11 refills | Status: DC

## 2022-05-13 LAB — TEST AUTHORIZATION

## 2022-05-13 LAB — INSULIN-LIKE GROWTH FACTOR
IGF-I, LC/MS: 73 ng/mL (ref 41–279)
Z-Score (Female): -1 SD (ref ?–2.0)

## 2022-05-13 LAB — ACTH: C206 ACTH: 18 pg/mL (ref 6–50)

## 2022-05-13 LAB — PROLACTIN: Prolactin: 3 ng/mL

## 2022-05-13 LAB — GROWTH HORMONE: Growth Hormone: 1.3 ng/mL (ref <=7.1)

## 2022-06-15 ENCOUNTER — Encounter (INDEPENDENT_AMBULATORY_CARE_PROVIDER_SITE_OTHER): Payer: Medicare HMO | Admitting: Ophthalmology

## 2022-06-22 ENCOUNTER — Ambulatory Visit (INDEPENDENT_AMBULATORY_CARE_PROVIDER_SITE_OTHER): Payer: Medicare HMO | Admitting: Ophthalmology

## 2022-06-22 ENCOUNTER — Encounter (INDEPENDENT_AMBULATORY_CARE_PROVIDER_SITE_OTHER): Payer: Self-pay | Admitting: Ophthalmology

## 2022-06-22 DIAGNOSIS — E113512 Type 2 diabetes mellitus with proliferative diabetic retinopathy with macular edema, left eye: Secondary | ICD-10-CM | POA: Diagnosis not present

## 2022-06-22 DIAGNOSIS — H43822 Vitreomacular adhesion, left eye: Secondary | ICD-10-CM | POA: Diagnosis not present

## 2022-06-22 DIAGNOSIS — Z961 Presence of intraocular lens: Secondary | ICD-10-CM | POA: Diagnosis not present

## 2022-06-22 DIAGNOSIS — E113551 Type 2 diabetes mellitus with stable proliferative diabetic retinopathy, right eye: Secondary | ICD-10-CM | POA: Diagnosis not present

## 2022-06-22 NOTE — Assessment & Plan Note (Signed)
Physiologic OS observe

## 2022-06-22 NOTE — Assessment & Plan Note (Signed)
Quiescent PDR OD.  Continue to monitor no interval change in acuity no active maculopathy

## 2022-06-22 NOTE — Assessment & Plan Note (Signed)
Quiescent PDR OS, no active maculopathy continue to observe

## 2022-06-22 NOTE — Assessment & Plan Note (Signed)
Stable

## 2022-06-22 NOTE — Progress Notes (Signed)
06/22/2022     CHIEF COMPLAINT Patient presents for  Chief Complaint  Patient presents with   Retina Follow Up   Diabetic Retinopathy without Macular Edema      HISTORY OF PRESENT ILLNESS: Danielle Schroeder is a 66 y.o. female who presents to the clinic today for:   HPI     Retina Follow Up           Diagnosis: Diabetic Retinopathy   Laterality: right eye   Severity: moderate   Course: stable         Comments   9 MOS FU OU OCT FP Pt stated no changes in vision since last visit. Pt denies new floaters and FOL. Pt reports an injection 3x daily for Pituitary edenoma.       Last edited by Hurman Horn, MD on 06/22/2022 11:19 AM.      Referring physician: Joycelyn Man, [address],  [place] [address]  HISTORICAL INFORMATION:   Selected notes from the MEDICAL RECORD NUMBER    Lab Results  Component Value Date   HGBA1C 8.2 (A) 05/02/2022     CURRENT MEDICATIONS: No current outpatient medications on file. (Ophthalmic Drugs)   No current facility-administered medications for this visit. (Ophthalmic Drugs)   Current Outpatient Medications (Other)  Medication Sig   amLODipine (NORVASC) 10 MG tablet Take 5 mg by mouth daily.    aspirin EC 81 MG tablet Take 81 mg by mouth daily.   carvedilol (COREG) 25 MG tablet Take 25 mg by mouth 2 (two) times daily with a meal.   furosemide (LASIX) 40 MG tablet TAKE 1 TABLET BY MOUTH TWICE A DAY   glucose blood test strip 1 each by Other route 3 (three) times daily. Use as instructed   ibuprofen (ADVIL) 800 MG tablet Take 800 mg by mouth every 6 (six) hours.   Insulin Glargine (BASAGLAR KWIKPEN) 100 UNIT/ML Inject 20 Units into the skin 2 (two) times daily.   Insulin Pen Needle (PEN NEEDLES) 31G X 5 MM MISC 1 each by Does not apply route daily.   levothyroxine (SYNTHROID) 88 MCG tablet TAKE 1 TABLET BY MOUTH EVERY DAY   linagliptin (TRADJENTA) 5 MG TABS tablet Take 1 tablet (5 mg total) by mouth daily.    losartan (COZAAR) 100 MG tablet Take 100 mg by mouth daily.   metolazone (ZAROXOLYN) 5 MG tablet Take 5 mg by mouth daily.   Octreotide Acetate 100 MCG/ML SOSY Inject 1 mL (100 mcg total) into the skin 3 (three) times daily.   potassium chloride (KLOR-CON) 10 MEQ tablet TAKE 1 TABLET BY MOUTH EVERY DAY.   simvastatin (ZOCOR) 20 MG tablet Take 20 mg by mouth daily.   SYRINGE-NEEDLE, DISP, 3 ML 25G X 1" 3 ML MISC Inject 1 ml into the skin 3 times daily.   Vitamin D, Cholecalciferol, 25 MCG (1000 UT) CAPS Take 2 capsules by mouth daily.   No current facility-administered medications for this visit. (Other)      REVIEW OF SYSTEMS: ROS   Negative for: Constitutional, Gastrointestinal, Neurological, Skin, Genitourinary, Musculoskeletal, HENT, Endocrine, Cardiovascular, Eyes, Respiratory, Psychiatric, Allergic/Imm, Heme/Lymph Last edited by Silvestre Moment on 06/22/2022 10:20 AM.       ALLERGIES No Known Allergies  PAST MEDICAL HISTORY Past Medical History:  Diagnosis Date   Cataract    bilateral lens implants   CHF (congestive heart failure) ([facility])    Diabetes mellitus without complication ([place])    Hyperlipidemia  Hypertension    Pituitary adenoma with extrasellar extension ([facility])    Sleep apnea    no c-pap   Thyroid disease    hypothyroidism   Past Surgical History:  Procedure Laterality Date   BREAST SURGERY     CHOLECYSTECTOMY     TONSILLECTOMY     TUBAL LIGATION      FAMILY HISTORY Family History  Problem Relation Age of Onset   Thyroid disease Neg Hx    Colon cancer Neg Hx    Esophageal cancer Neg Hx    Rectal cancer Neg Hx    Stomach cancer Neg Hx     SOCIAL HISTORY Social History   Tobacco Use   Smoking status: Never   Smokeless tobacco: Never  Substance Use Topics   Alcohol use: Never   Drug use: Never         OPHTHALMIC EXAM:  Base Eye Exam     Visual Acuity (ETDRS)       Right Left   Dist [place] 20/80 -1 20/25   Dist ph [place] 20/40           Tonometry (Tonopen, 10:24 AM)       Right Left   Pressure 22 20         Pupils       Pupils Dark Light Shape React APD   Right PERRL 4 3 Round Slow None   Left PERRL 4 3 Round Slow None         Visual Fields       Left Right    Full Full         Extraocular Movement       Right Left    Full, Ortho Full, Ortho         Neuro/Psych     Oriented x3: Yes   Mood/Affect: Normal         Dilation     Both eyes: 1.0% Mydriacyl, 2.5% Phenylephrine @ 10:24 AM           Slit Lamp and Fundus Exam     External Exam       Right Left   External Normal Normal         Slit Lamp Exam       Right Left   Lids/Lashes Normal Normal   Conjunctiva/Sclera White and quiet White and quiet   Cornea Clear Clear   Anterior Chamber Deep and quiet Deep and quiet   Iris Round and reactive Round and reactive   Lens Posterior chamber intraocular lens, Open posterior capsule Posterior chamber intraocular lens, Open posterior capsule   Anterior Vitreous Normal Normal         Fundus Exam       Right Left   Posterior Vitreous Normal Normal   Disc Normal Normal   C/D Ratio 0.3 0.35   Macula no macular thickening, Focal laser scars no macular thickening, Focal laser scars   Vessels PDR quiescent,, PDR quiescent,,   Periphery Good moderate scatter PRP Good scatter PRP, some room nasally for more laser but no active areas of NVE            IMAGING AND PROCEDURES  Imaging and Procedures for 06/22/22  OCT, Retina - OU - Both Eyes       Right Eye Quality was good. Scan locations included subfoveal. Central Foveal Thickness: 233. Progression has been stable. Findings include abnormal foveal contour, retinal drusen .   Left Eye Quality was good. Scan  locations included subfoveal. Central Foveal Thickness: 219. Progression has been stable. Findings include abnormal foveal contour, retinal drusen , vitreomacular adhesion .   Notes No active maculopathy OU.  We will  continue to observe.     Color Fundus Photography Optos - OU - Both Eyes       Right Eye Progression has been stable. Disc findings include normal observations. Macula : normal observations.   Left Eye Progression has been stable. Disc findings include normal observations.   Notes OD, good PRP, quiescent proliferative diabetic retinopathy  OS, good PRP, quiescent proliferative diabetic retinopathy, room nasally for PRP in the future if required  No active maculopathy OU             ASSESSMENT/PLAN:  Stable treated proliferative diabetic retinopathy of right eye determined by examination associated with type 2 diabetes mellitus ([place]) Quiescent PDR OD.  Continue to monitor no interval change in acuity no active maculopathy  Proliferative diabetic retinopathy of left eye with macular edema associated with type 2 diabetes mellitus ([facility]) Quiescent PDR OS, no active maculopathy continue to observe  Vitreomacular adhesion, left Physiologic OS observe  Pseudophakia, both eyes Stable     ICD-10-CM   1. Stable treated proliferative diabetic retinopathy of right eye determined by examination associated with type 2 diabetes mellitus ([place])  E11.3551 OCT, Retina - OU - Both Eyes    Color Fundus Photography Optos - OU - Both Eyes    2. Proliferative diabetic retinopathy of left eye with macular edema associated with type 2 diabetes mellitus ([place])  W10.9323     3. Vitreomacular adhesion, left  H43.822     4. Pseudophakia, both eyes  Z96.1       1.  History of bilateral PDR now quiescent for years.  Current lead with excellent blood sugar control blood pressure control.  2.  No active maculopathy in either eye  3.  Good acuity pseudophakic condition.  Ophthalmic Meds Ordered this visit:  No orders of the defined types were placed in this encounter.      Return in about 9 months (around 03/23/2023) for DILATE OU, COLOR FP.  There are no Patient Instructions on file for  this visit.   Explained the diagnoses, plan, and follow up with the patient and they expressed understanding.  Patient expressed understanding of the importance of proper follow up care.   Clent Demark Tonisha Silvey M.D. Diseases & Surgery of the Retina and Vitreous Retina & Diabetic [place] 06/22/22     Abbreviations: M myopia (nearsighted); A astigmatism; H hyperopia (farsighted); P presbyopia; Mrx spectacle prescription;  CTL contact lenses; OD right eye; OS left eye; OU both eyes  XT exotropia; ET esotropia; PEK punctate epithelial keratitis; PEE punctate epithelial erosions; DES dry eye syndrome; MGD meibomian gland dysfunction; ATs artificial tears; PFAT's preservative free artificial tears; [place] nuclear sclerotic cataract; PSC posterior subcapsular cataract; ERM epi-retinal membrane; PVD posterior vitreous detachment; RD retinal detachment; DM diabetes mellitus; DR diabetic retinopathy; NPDR non-proliferative diabetic retinopathy; PDR proliferative diabetic retinopathy; CSME clinically significant macular edema; DME diabetic macular edema; dbh dot blot hemorrhages; CWS cotton wool spot; POAG primary open angle glaucoma; C/D cup-to-disc ratio; HVF humphrey visual field; GVF goldmann visual field; OCT optical coherence tomography; IOP intraocular pressure; BRVO Branch retinal vein occlusion; CRVO central retinal vein occlusion; CRAO central retinal artery occlusion; BRAO branch retinal artery occlusion; RT retinal tear; SB scleral buckle; PPV pars plana vitrectomy; VH Vitreous hemorrhage; PRP panretinal laser photocoagulation; IVK intravitreal kenalog; VMT  vitreomacular traction; MH Macular hole;  NVD neovascularization of the disc; NVE neovascularization elsewhere; AREDS age related eye disease study; ARMD age related macular degeneration; POAG primary open angle glaucoma; EBMD epithelial/anterior basement membrane dystrophy; ACIOL anterior chamber intraocular lens; IOL intraocular lens; PCIOL posterior  chamber intraocular lens; Phaco/IOL phacoemulsification with intraocular lens placement; [place] photorefractive keratectomy; LASIK laser assisted in situ keratomileusis; HTN hypertension; DM diabetes mellitus; COPD chronic obstructive pulmonary disease

## 2022-06-26 DIAGNOSIS — I872 Venous insufficiency (chronic) (peripheral): Secondary | ICD-10-CM | POA: Diagnosis not present

## 2022-06-26 DIAGNOSIS — L239 Allergic contact dermatitis, unspecified cause: Secondary | ICD-10-CM | POA: Diagnosis not present

## 2022-07-03 DIAGNOSIS — I10 Essential (primary) hypertension: Secondary | ICD-10-CM | POA: Diagnosis not present

## 2022-07-03 DIAGNOSIS — L239 Allergic contact dermatitis, unspecified cause: Secondary | ICD-10-CM | POA: Diagnosis not present

## 2022-07-03 DIAGNOSIS — I872 Venous insufficiency (chronic) (peripheral): Secondary | ICD-10-CM | POA: Diagnosis not present

## 2022-07-12 ENCOUNTER — Other Ambulatory Visit: Payer: Self-pay | Admitting: Family Medicine

## 2022-07-12 DIAGNOSIS — I739 Peripheral vascular disease, unspecified: Secondary | ICD-10-CM

## 2022-07-19 ENCOUNTER — Other Ambulatory Visit: Payer: Self-pay

## 2022-07-19 ENCOUNTER — Other Ambulatory Visit: Payer: Self-pay | Admitting: Cardiology

## 2022-07-19 ENCOUNTER — Telehealth: Payer: Self-pay | Admitting: Internal Medicine

## 2022-07-19 DIAGNOSIS — I10 Essential (primary) hypertension: Secondary | ICD-10-CM | POA: Diagnosis not present

## 2022-07-19 DIAGNOSIS — I872 Venous insufficiency (chronic) (peripheral): Secondary | ICD-10-CM | POA: Diagnosis not present

## 2022-07-19 DIAGNOSIS — E220 Acromegaly and pituitary gigantism: Secondary | ICD-10-CM

## 2022-07-19 MED ORDER — OCTREOTIDE ACETATE 100 MCG/ML ~~LOC~~ SOSY: 100.0000 ug | PREFILLED_SYRINGE | Freq: Three times a day (TID) | SUBCUTANEOUS | 11 refills | Status: DC

## 2022-07-19 NOTE — Telephone Encounter (Signed)
MEDICATION: Octreotide Acetate 100 mcg  PHARMACY:  [facility]  HAS THE PATIENT CONTACTED THEIR PHARMACY?  yes  IS THIS A 90 DAY SUPPLY : unknown  IS PATIENT OUT OF MEDICATION: yes  IF NOT; HOW MUCH IS LEFT:   LAST APPOINTMENT DATE: '@6'$ /20/2023  NEXT APPOINTMENT DATE:'@10'$ /24/2023  DO WE HAVE YOUR PERMISSION TO LEAVE A DETAILED MESSAGE?:  OTHER COMMENTS:    **Let patient know to contact pharmacy at the end of the day to make sure medication is ready. **  ** Please notify patient to allow 48-72 hours to process**  **Encourage patient to contact the pharmacy for refills or they can request refills through [facility]**

## 2022-07-19 NOTE — Telephone Encounter (Signed)
RX now sent ?

## 2022-07-20 ENCOUNTER — Ambulatory Visit
Admission: RE | Admit: 2022-07-20 | Discharge: 2022-07-20 | Disposition: A | Discharge status: Home / Self Care | Payer: Medicare HMO | Source: Ambulatory Visit | Attending: Family Medicine | Admitting: Family Medicine

## 2022-07-20 ENCOUNTER — Other Ambulatory Visit: Payer: Self-pay | Admitting: Family Medicine

## 2022-07-20 ENCOUNTER — Other Ambulatory Visit: Payer: Self-pay

## 2022-07-20 DIAGNOSIS — E785 Hyperlipidemia, unspecified: Secondary | ICD-10-CM | POA: Insufficient documentation

## 2022-07-20 DIAGNOSIS — D126 Benign neoplasm of colon, unspecified: Secondary | ICD-10-CM

## 2022-07-20 DIAGNOSIS — I739 Peripheral vascular disease, unspecified: Secondary | ICD-10-CM

## 2022-07-20 DIAGNOSIS — N1832 Chronic kidney disease, stage 3b: Secondary | ICD-10-CM | POA: Insufficient documentation

## 2022-07-20 DIAGNOSIS — N183 Chronic kidney disease, stage 3 unspecified: Secondary | ICD-10-CM | POA: Insufficient documentation

## 2022-07-20 DIAGNOSIS — I878 Other specified disorders of veins: Secondary | ICD-10-CM

## 2022-07-20 DIAGNOSIS — E11319 Type 2 diabetes mellitus with unspecified diabetic retinopathy without macular edema: Secondary | ICD-10-CM | POA: Insufficient documentation

## 2022-07-20 HISTORY — DX: Benign neoplasm of colon, unspecified: D12.6

## 2022-07-24 ENCOUNTER — Ambulatory Visit
Admission: RE | Admit: 2022-07-24 | Discharge: 2022-07-24 | Disposition: A | Discharge status: Home / Self Care | Payer: Medicare HMO | Source: Ambulatory Visit | Attending: Endocrinology | Admitting: Endocrinology

## 2022-07-24 DIAGNOSIS — D352 Benign neoplasm of pituitary gland: Secondary | ICD-10-CM

## 2022-07-24 DIAGNOSIS — I872 Venous insufficiency (chronic) (peripheral): Secondary | ICD-10-CM | POA: Diagnosis not present

## 2022-07-24 DIAGNOSIS — E119 Type 2 diabetes mellitus without complications: Secondary | ICD-10-CM | POA: Diagnosis not present

## 2022-07-24 DIAGNOSIS — J3489 Other specified disorders of nose and nasal sinuses: Secondary | ICD-10-CM | POA: Diagnosis not present

## 2022-07-24 DIAGNOSIS — Z794 Long term (current) use of insulin: Secondary | ICD-10-CM | POA: Diagnosis not present

## 2022-07-24 DIAGNOSIS — R6889 Other general symptoms and signs: Secondary | ICD-10-CM | POA: Diagnosis not present

## 2022-07-24 DIAGNOSIS — I739 Peripheral vascular disease, unspecified: Secondary | ICD-10-CM | POA: Diagnosis not present

## 2022-07-24 DIAGNOSIS — C719 Malignant neoplasm of brain, unspecified: Secondary | ICD-10-CM | POA: Diagnosis not present

## 2022-07-24 MED ORDER — GADOBENATE DIMEGLUMINE 529 MG/ML IV SOLN
10.0000 mL | Freq: Once | INTRAVENOUS | Status: AC | PRN
  Administered 2022-07-24: 10 mL via INTRAVENOUS

## 2022-07-25 ENCOUNTER — Other Ambulatory Visit: Payer: Self-pay | Admitting: Family

## 2022-07-25 DIAGNOSIS — I739 Peripheral vascular disease, unspecified: Secondary | ICD-10-CM

## 2022-07-25 DIAGNOSIS — R6889 Other general symptoms and signs: Secondary | ICD-10-CM

## 2022-07-28 ENCOUNTER — Encounter: Payer: Self-pay | Admitting: Internal Medicine

## 2022-07-31 DIAGNOSIS — E11628 Type 2 diabetes mellitus with other skin complications: Secondary | ICD-10-CM | POA: Diagnosis not present

## 2022-07-31 DIAGNOSIS — I872 Venous insufficiency (chronic) (peripheral): Secondary | ICD-10-CM | POA: Diagnosis not present

## 2022-07-31 DIAGNOSIS — Z794 Long term (current) use of insulin: Secondary | ICD-10-CM | POA: Diagnosis not present

## 2022-08-01 ENCOUNTER — Other Ambulatory Visit: Payer: Self-pay | Admitting: Family Medicine

## 2022-08-01 DIAGNOSIS — Z1231 Encounter for screening mammogram for malignant neoplasm of breast: Secondary | ICD-10-CM

## 2022-08-04 DIAGNOSIS — E11628 Type 2 diabetes mellitus with other skin complications: Secondary | ICD-10-CM | POA: Diagnosis not present

## 2022-08-04 DIAGNOSIS — I872 Venous insufficiency (chronic) (peripheral): Secondary | ICD-10-CM | POA: Diagnosis not present

## 2022-08-04 DIAGNOSIS — Z794 Long term (current) use of insulin: Secondary | ICD-10-CM | POA: Diagnosis not present

## 2022-08-04 DIAGNOSIS — N1831 Chronic kidney disease, stage 3a: Secondary | ICD-10-CM | POA: Diagnosis not present

## 2022-08-04 DIAGNOSIS — Z5189 Encounter for other specified aftercare: Secondary | ICD-10-CM | POA: Diagnosis not present

## 2022-08-07 ENCOUNTER — Encounter (HOSPITAL_BASED_OUTPATIENT_CLINIC_OR_DEPARTMENT_OTHER): Payer: Medicare HMO | Attending: Internal Medicine | Admitting: Internal Medicine

## 2022-08-07 DIAGNOSIS — E1151 Type 2 diabetes mellitus with diabetic peripheral angiopathy without gangrene: Secondary | ICD-10-CM | POA: Insufficient documentation

## 2022-08-07 DIAGNOSIS — I110 Hypertensive heart disease with heart failure: Secondary | ICD-10-CM | POA: Diagnosis not present

## 2022-08-07 DIAGNOSIS — I872 Venous insufficiency (chronic) (peripheral): Secondary | ICD-10-CM | POA: Insufficient documentation

## 2022-08-07 DIAGNOSIS — E1165 Type 2 diabetes mellitus with hyperglycemia: Secondary | ICD-10-CM | POA: Insufficient documentation

## 2022-08-07 DIAGNOSIS — E11622 Type 2 diabetes mellitus with other skin ulcer: Secondary | ICD-10-CM | POA: Insufficient documentation

## 2022-08-07 DIAGNOSIS — I10 Essential (primary) hypertension: Secondary | ICD-10-CM

## 2022-08-07 DIAGNOSIS — Z794 Long term (current) use of insulin: Secondary | ICD-10-CM | POA: Insufficient documentation

## 2022-08-08 NOTE — Progress Notes (Signed)
Danielle Schroeder, Danielle Schroeder ([id number]) Visit Report for 08/07/2022 Allergy List Details Patient Name: Date of Service: Danielle Schroeder, Danielle Schroeder 08/07/2022 8:00 A M Medical Record Number: [id number] Patient Account Number: [id number] Date of Birth/Sex: Treating RN: 12/16/1955 (66 y.o. Danielle Schroeder, Danielle Schroeder Primary Care Danielle Schroeder: Danielle Schroeder Other Clinician: Referring Danielle Schroeder: Treating Danielle Schroeder/Extender: Danielle Schroeder: 0 Allergies Active Allergies No Known Allergies Allergy Notes Electronic Signature(s) Signed: 08/08/2022 4:47:31 PM By: Rhae Hammock RN Entered By: Rhae Hammock on 08/07/2022 08:27:03 -------------------------------------------------------------------------------- Arrival Information Details Patient Name: Date of Service: Danielle Schroeder 08/07/2022 8:00 A M Medical Record Number: [id number] Patient Account Number: [id number] Date of Birth/Sex: Treating RN: [birth year] (66 y.o. Danielle Schroeder, Danielle Schroeder Primary Care Danielle Schroeder: Danielle Schroeder Other Clinician: Referring Danielle Schroeder: Treating Danielle Schroeder/Extender: Danielle Schroeder: 0 Visit Information Patient Arrived: Ambulatory Arrival Time: 08:21 Accompanied By: self Transfer Assistance: None Patient Identification Verified: Yes Secondary Verification Process Completed: Yes Patient Requires Transmission-Based Precautions: No Patient Has Alerts: No Electronic Signature(s) Signed: 08/08/2022 4:47:31 PM By: Rhae Hammock RN Entered By: Rhae Hammock on 08/07/2022 08:21:26 -------------------------------------------------------------------------------- Clinic Level of Care Assessment Details Patient Name: Date of Service: Danielle Schroeder. 08/07/2022 8:00 A M Medical Record Number: [id number] Patient Account Number: [id number] Date of Birth/Sex: Treating RN: 15-Dec-1955 (66 y.o. Danielle Schroeder, Danielle Schroeder Primary Care Brodan Grewell: Danielle Schroeder Other  Clinician: Referring Kendrea Cerritos: Treating Jakori Burkett/Extender: Danielle Schroeder: 0 Clinic Level of Care Assessment Items TOOL 4 Quantity Score X- 1 0 Use when only an EandM is performed on FOLLOW-UP visit ASSESSMENTS - Nursing Assessment / Reassessment X- 1 10 Reassessment of Co-morbidities (includes updates in patient status) X- 1 5 Reassessment of Adherence to Schroeder Plan ASSESSMENTS - Wound and Skin A ssessment / Reassessment X - Simple Wound Assessment / Reassessment - one wound 1 5 '[]'$  - 0 Complex Wound Assessment / Reassessment - multiple wounds '[]'$  - 0 Dermatologic / Skin Assessment (not related to wound area) ASSESSMENTS - Focused Assessment X- 1 5 Circumferential Edema Measurements - multi extremities '[]'$  - 0 Nutritional Assessment / Counseling / Intervention '[]'$  - 0 Lower Extremity Assessment (monofilament, tuning fork, pulses) '[]'$  - 0 Peripheral Arterial Disease Assessment (using hand held doppler) ASSESSMENTS - Ostomy and/or Continence Assessment and Care '[]'$  - 0 Incontinence Assessment and Management '[]'$  - 0 Ostomy Care Assessment and Management (repouching, etc.) PROCESS - Coordination of Care X - Simple Patient / Family Education for ongoing care 1 15 '[]'$  - 0 Complex (extensive) Patient / Family Education for ongoing care X- 1 10 Staff obtains Programmer, systems, Records, T Results / Process Orders est '[]'$  - 0 Staff telephones HHA, Nursing Homes / Clarify orders / etc '[]'$  - 0 Routine Transfer to another Facility (non-emergent condition) '[]'$  - 0 Routine Hospital Admission (non-emergent condition) X- 1 15 New Admissions / Biomedical engineer / Ordering NPWT Apligraf, etc. , '[]'$  - 0 Emergency Hospital Admission (emergent condition) X- 1 10 Simple Discharge Coordination '[]'$  - 0 Complex (extensive) Discharge Coordination PROCESS - Special Needs '[]'$  - 0 Pediatric / Minor Patient Management '[]'$  - 0 Isolation Patient Management '[]'$  -  0 Hearing / Language / Visual special needs '[]'$  - 0 Assessment of Community assistance (transportation, D/C planning, etc.) '[]'$  - 0 Additional assistance / Altered mentation '[]'$  - 0 Support Surface(s) Assessment (bed, cushion, seat, etc.) INTERVENTIONS - Wound Cleansing / Measurement X - Simple Wound Cleansing - one wound 1 5 '[]'$  - 0 Complex Wound Cleansing -  multiple wounds '[]'$  - 0 Wound Imaging (photographs - any number of wounds) '[]'$  - 0 Wound Tracing (instead of photographs) X- 1 5 Simple Wound Measurement - one wound '[]'$  - 0 Complex Wound Measurement - multiple wounds INTERVENTIONS - Wound Dressings '[]'$  - 0 Small Wound Dressing one or multiple wounds '[]'$  - 0 Medium Wound Dressing one or multiple wounds '[]'$  - 0 Large Wound Dressing one or multiple wounds '[]'$  - 0 Application of Medications - topical '[]'$  - 0 Application of Medications - injection INTERVENTIONS - Miscellaneous '[]'$  - 0 External ear exam '[]'$  - 0 Specimen Collection (cultures, biopsies, blood, body fluids, etc.) '[]'$  - 0 Specimen(s) / Culture(s) sent or taken to Lab for analysis '[]'$  - 0 Patient Transfer (multiple staff / Civil Service fast streamer / Similar devices) '[]'$  - 0 Simple Staple / Suture removal (25 or less) '[]'$  - 0 Complex Staple / Suture removal (26 or more) '[]'$  - 0 Hypo / Hyperglycemic Management (close monitor of Blood Glucose) '[]'$  - 0 Ankle / Brachial Index (ABI) - do not check if billed separately X- 1 5 Vital Signs Has the patient been seen at the hospital within the last three years: Yes Total Score: 90 Level Of Care: New/Established - Level 3 Electronic Signature(s) Signed: 08/08/2022 4:47:31 PM By: Rhae Hammock RN Entered By: Rhae Hammock on 08/07/2022 16:31:43 -------------------------------------------------------------------------------- Encounter Discharge Information Details Patient Name: Date of Service: Danielle Schroeder. 08/07/2022 8:00 A M Medical Record Number: [id number] Patient Account  Number: [id number] Date of Birth/Sex: Treating RN: 05-12-1956 (66 y.o. Danielle Schroeder, Danielle Schroeder Primary Care Zakyia Gagan: Danielle Schroeder Other Clinician: Referring Tiaunna Buford: Treating Myrtle Haller/Extender: Danielle Schroeder: 0 Encounter Discharge Information Items Discharge Condition: Stable Ambulatory Status: Ambulatory Discharge Destination: Home Transportation: Private Auto Accompanied By: self Schedule Follow-up Appointment: Yes Clinical Summary of Care: Patient Declined Electronic Signature(s) Signed: 08/08/2022 4:47:31 PM By: Rhae Hammock RN Entered By: Rhae Hammock on 08/07/2022 16:32:43 -------------------------------------------------------------------------------- Lower Extremity Assessment Details Patient Name: Date of Service: Danielle Schroeder 08/07/2022 8:00 A M Medical Record Number: [id number] Patient Account Number: [id number] Date of Birth/Sex: Treating RN: 07/27/1956 (66 y.o. Danielle Schroeder, Danielle Schroeder Primary Care Alexandera Kuntzman: Danielle Schroeder Other Clinician: Referring Cuca Benassi: Treating Amaiyah Nordhoff/Extender: Danielle Schroeder: 0 Edema Assessment Assessed: Shirlyn Goltz: No] Patrice Paradise: Yes] Edema: [Left: Ye] [Right: s] Calf Left: Right: Point of Measurement: 32 cm From Medial Instep 48 cm Ankle Left: Right: Point of Measurement: 10 cm From Medial Instep 28 cm Knee To Floor Left: Right: From Medial Instep 43 cm Vascular Assessment Pulses: Dorsalis Pedis Palpable: [Right:Yes] Posterior Tibial Palpable: [Right:Yes] Electronic Signature(s) Signed: 08/08/2022 4:47:31 PM By: Rhae Hammock RN Entered By: Rhae Hammock on 08/07/2022 08:37:17 -------------------------------------------------------------------------------- Multi Wound Chart Details Patient Name: Date of Service: Danielle Schroeder. 08/07/2022 8:00 A M Medical Record Number: [id number] Patient Account Number: [id number] Date of Birth/Sex: Treating  RN: 22-Jun-1956 (66 y.o. F) Primary Care Martino Tompson: Danielle Schroeder Other Clinician: Referring Amarion Portell: Treating Omar Orrego/Extender: Danielle Schroeder: 0 Vital Signs Height(in): 67 Capillary Blood Glucose(mg/dl): 120 Weight(lbs): 234 Pulse(bpm): 74 Body Mass Index(BMI): 36.6 Blood Pressure(mmHg): 155/74 Temperature(F): 98.5 Respiratory Rate(breaths/min): 17 Wound Assessments Schroeder Notes Electronic Signature(s) Signed: 08/07/2022 10:16:22 AM By: Kalman Shan DO Entered By: Kalman Shan on 08/07/2022 10:10:23 -------------------------------------------------------------------------------- [place] Details Patient Name: Date of Service: Danielle Schroeder. 08/07/2022 8:00 A M Medical Record Number: [id number] Patient Account Number: [id number] Date of Birth/Sex: Treating RN: 04/06/1956 (66 y.o. F)  Rhae Hammock Primary Care Manmeet Arzola: Danielle Schroeder Other Clinician: Referring Zeyna Mkrtchyan: Treating Narek Kniss/Extender: Danielle Schroeder: 0 Active Inactive Electronic Signature(s) Signed: 08/08/2022 4:47:31 PM By: Rhae Hammock RN Entered By: Rhae Hammock on 08/07/2022 08:39:13 -------------------------------------------------------------------------------- Pain Assessment Details Patient Name: Date of Service: Danielle Schroeder 08/07/2022 8:00 A M Medical Record Number: [id number] Patient Account Number: [id number] Date of Birth/Sex: Treating RN: Apr 05, 1956 (66 y.o. Danielle Schroeder, Danielle Schroeder Primary Care Taichi Repka: Danielle Schroeder Other Clinician: Referring Makye Radle: Treating Donesha Wallander/Extender: Danielle Schroeder: 0 Active Problems Location of Pain Severity and Description of Pain Patient Has Paino No Site Locations Pain Management and Medication Current Pain Management: Electronic Signature(s) Signed: 08/08/2022 4:47:31 PM By: Rhae Hammock  RN Entered By: Rhae Hammock on 08/07/2022 08:37:24 -------------------------------------------------------------------------------- Patient/Caregiver Education Details Patient Name: Date of Service: Schommer, DO RTHEA G. 9/25/2023andnbsp8:00 A M Medical Record Number: [id number] Patient Account Number: [id number] Date of Birth/Gender: Treating RN: [birth year] (66 y.o. Danielle Schroeder, [place] Primary Care Physician: Danielle Schroeder Other Clinician: Referring Physician: Treating Physician/Extender: Danielle Schroeder: 0 Education Assessment Education Provided To: Patient Education Topics Provided Wound/Skin Impairment: Methods: Explain/Verbal Responses: Reinforcements needed, State content correctly Electronic Signature(s) Signed: 08/08/2022 4:47:31 PM By: Rhae Hammock RN Entered By: Rhae Hammock on 08/07/2022 08:39:22 -------------------------------------------------------------------------------- Vitals Details Patient Name: Date of Service: Danielle Schroeder. 08/07/2022 8:00 A M Medical Record Number: [id number] Patient Account Number: [id number] Date of Birth/Sex: Treating RN: 1956/11/06 (66 y.o. Danielle Schroeder, Danielle Schroeder Primary Care Alyze Lauf: Danielle Schroeder Other Clinician: Referring Maryan Sivak: Treating Celene Pippins/Extender: Danielle Schroeder: 0 Vital Signs Time Taken: 08:22 Temperature (F): 98.5 Height (in): 67 Pulse (bpm): 74 Source: Stated Respiratory Rate (breaths/min): 17 Weight (lbs): 234 Blood Pressure (mmHg): 155/74 Source: Stated Capillary Blood Glucose (mg/dl): 120 Body Mass Index (BMI): 36.6 Reference Range: 80 - 120 mg / dl Electronic Signature(s) Signed: 08/08/2022 4:47:31 PM By: Rhae Hammock RN Entered By: Rhae Hammock on 08/07/2022 08:23:41

## 2022-08-08 NOTE — Progress Notes (Signed)
Danielle Schroeder ([id number]) Visit Report for 08/07/2022 Chief Complaint Document Details Patient Name: Date of Service: Danielle Schroeder, Danielle Schroeder 08/07/2022 8:00 A M Medical Record Number: [id number] Patient Account Number: [id number] Date of Birth/Sex: Treating RN: Nov 09, 1956 (66 y.o. F) Primary Care Provider: Earnestine Leys Other Clinician: Referring Provider: Treating Provider/Extender: Jacob Moores in Treatment: 0 Information Obtained from: Patient Chief Complaint 08/07/2022; history of right lower extremity wound Electronic Signature(s) Signed: 08/07/2022 10:16:22 AM By: Kalman Shan DO Entered By: Kalman Shan on 08/07/2022 10:13:44 -------------------------------------------------------------------------------- HPI Details Patient Name: Date of Service: Danielle Oh G. 08/07/2022 8:00 A M Medical Record Number: [id number] Patient Account Number: [id number] Date of Birth/Sex: Treating RN: 1956/01/23 (66 y.o. F) Primary Care Provider: Earnestine Leys Other Clinician: Referring Provider: Treating Provider/Extender: Jacob Moores in Treatment: 0 History of Present Illness HPI Description: Admission 08/07/2022 Danielle Schroeder is a 66 year old female with a past medical history of uncontrolled insulin-dependent type 2 diabetes with last hemoglobin A1c of 9.5, venous insufficiency and hypertension that presents the clinic for a history of right lower extremity wound. She has been followed by her primary care office for this issue for the past 2 months. She has been given doxycycline and treated with Different types of wraps last being Kerlix/Coban. She states she has compression stockings at home. She has not noticed any drainage to the wound site over the past week. She had arterial studies done however has not had an appointment with vein and vascular. T oday she has no open wounds. She denies a history of wounds to her left lower  extremity. Electronic Signature(s) Signed: 08/07/2022 10:16:22 AM By: Kalman Shan DO Entered By: Kalman Shan on 08/07/2022 10:14:16 -------------------------------------------------------------------------------- Physical Exam Details Patient Name: Date of Service: Danielle Schroeder 08/07/2022 8:00 A M Medical Record Number: [id number] Patient Account Number: [id number] Date of Birth/Sex: Treating RN: 03/12/1956 (66 y.o. F) Primary Care Provider: Earnestine Leys Other Clinician: Referring Provider: Treating Provider/Extender: Jacob Moores in Treatment: 0 Constitutional respirations regular, non-labored and within target range for patient.. Cardiovascular 2+ dorsalis pedis/posterior tibialis pulses. Psychiatric pleasant and cooperative. Notes Right lower extremity: T the distal medial aspect there is evidence of a previous blister with epithelialized skin. No drainage noted. No open wounds. No signs o of surrounding soft tissue infection. 2+ pitting edema to the knee Electronic Signature(s) Signed: 08/07/2022 10:16:22 AM By: Kalman Shan DO Entered By: Kalman Shan on 08/07/2022 10:14:30 -------------------------------------------------------------------------------- Physician Orders Details Patient Name: Date of Service: Danielle Schroeder 08/07/2022 8:00 A M Medical Record Number: [id number] Patient Account Number: [id number] Date of Birth/Sex: Treating RN: [birth year] (66 y.o. F) Primary Care Provider: Earnestine Leys Other Clinician: Referring Provider: Treating Provider/Extender: Jacob Moores in Treatment: 0 Verbal / Phone Orders: No Diagnosis Coding ICD-10 Coding Code Description I87.2 Venous insufficiency (chronic) (peripheral) E11.622 Type 2 diabetes mellitus with other skin ulcer I10 Essential (primary) hypertension Electronic Signature(s) Signed: 08/07/2022 10:16:22 AM By: Kalman Shan DO Entered By:  Kalman Shan on 08/07/2022 10:14:36 -------------------------------------------------------------------------------- Problem List Details Patient Name: Date of Service: Danielle Schroeder. 08/07/2022 8:00 A M Medical Record Number: [id number] Patient Account Number: [id number] Date of Birth/Sex: Treating RN: December 05, 1955 (66 y.o. F) Primary Care Provider: Earnestine Leys Other Clinician: Referring Provider: Treating Provider/Extender: Jacob Moores in Treatment: 0 Active Problems ICD-10 Encounter Encounter Code Description Active Date MDM Diagnosis I87.2 Venous insufficiency (chronic) (peripheral) 08/07/2022 No Yes E11.622 Type 2  diabetes mellitus with other skin ulcer 08/07/2022 No Yes I10 Essential (primary) hypertension 08/07/2022 No Yes Inactive Problems Resolved Problems Electronic Signature(s) Signed: 08/07/2022 10:16:22 AM By: Kalman Shan DO Entered By: Kalman Shan on 08/07/2022 10:10:20 -------------------------------------------------------------------------------- Progress Note Details Patient Name: Date of Service: Danielle Schroeder. 08/07/2022 8:00 A M Medical Record Number: [id number] Patient Account Number: [id number] Date of Birth/Sex: Treating RN: 11/07/1956 (66 y.o. F) Primary Care Provider: Earnestine Leys Other Clinician: Referring Provider: Treating Provider/Extender: Jacob Moores in Treatment: 0 Subjective Chief Complaint Information obtained from Patient 08/07/2022; history of right lower extremity wound History of Present Illness (HPI) Admission 08/07/2022 Danielle Schroeder is a 66 year old female with a past medical history of uncontrolled insulin-dependent type 2 diabetes with last hemoglobin A1c of 9.5, venous insufficiency and hypertension that presents the clinic for a history of right lower extremity wound. She has been followed by her primary care office for this issue for the past 2 months. She has  been given doxycycline and treated with Different types of wraps last being Kerlix/Coban. She states she has compression stockings at home. She has not noticed any drainage to the wound site over the past week. She had arterial studies done however has not had an appointment with vein and vascular. T oday she has no open wounds. She denies a history of wounds to her left lower extremity. Patient History Information obtained from Patient, Caregiver, Chart. Allergies No Known Allergies Family History Unknown History. Social History Never smoker, Marital Status - Single, Alcohol Use - Never, Drug Use - No History, Caffeine Use - Moderate - coffee. Medical History Eyes Patient has history of Cataracts Respiratory Patient has history of Sleep Apnea - no c pap Cardiovascular Patient has history of Congestive Heart Failure, Hypertension, Peripheral Arterial Disease, Peripheral Venous Disease Endocrine Patient has history of Type II Diabetes Patient is treated with Insulin, Oral Agents. Blood sugar is tested. Hospitalization/Surgery History - breast surgery. - gall bladder surgery. - tube tied. - hysterectomy. Medical A Surgical History Notes nd Cardiovascular hyperlipidemia Endocrine pituitary adenoma and thyroid disease hypothyroidism Review of Systems (ROS) Constitutional Symptoms (General Health) Denies complaints or symptoms of Fatigue, Fever, Chills, Marked Weight Change. Eyes Denies complaints or symptoms of Dry Eyes, Vision Changes, Glasses / Contacts. Ear/Nose/Mouth/Throat Denies complaints or symptoms of Chronic sinus problems or rhinitis. Respiratory Denies complaints or symptoms of Chronic or frequent coughs, Shortness of Breath. Gastrointestinal Denies complaints or symptoms of Frequent diarrhea, Nausea, Vomiting. Genitourinary Denies complaints or symptoms of Frequent urination. Integumentary (Skin) Complains or has symptoms of Wounds. Musculoskeletal Denies  complaints or symptoms of Muscle Pain, Muscle Weakness. Objective Constitutional respirations regular, non-labored and within target range for patient.. Vitals Time Taken: 8:22 AM, Height: 67 in, Source: Stated, Weight: 234 lbs, Source: Stated, BMI: 36.6, Temperature: 98.5 F, Pulse: 74 bpm, Respiratory Rate: 17 breaths/min, Blood Pressure: 155/74 mmHg, Capillary Blood Glucose: 120 mg/dl. Cardiovascular 2+ dorsalis pedis/posterior tibialis pulses. Psychiatric pleasant and cooperative. General Notes: Right lower extremity: T the distal medial aspect there is evidence of a previous blister with epithelialized skin. No drainage noted. No open o wounds. No signs of surrounding soft tissue infection. 2+ pitting edema to the knee Assessment Active Problems ICD-10 Venous insufficiency (chronic) (peripheral) Type 2 diabetes mellitus with other skin ulcer Essential (primary) hypertension Patient has a history of venous insufficiency and reports having a wound that presented itself 2 months. She has been treated with wraps by her primary care office. She has since  healed her wound. She had arterial studies that show arterial disease. Per her provider note she was referred to vein and vascular. I recommended she follow this up. For now I recommended wearing her compression stockings and keeping her legs elevated. If she develops open wounds or drainage I recommend she call our office. 46 minutes was spent on the encounter including face-to-face, EMR review and coordination of care Plan 1. Compression stockings daily 2. Elevate legs 3. Follow-up with vein and vascular 4. Follow-up in our office as needed Electronic Signature(s) Signed: 08/07/2022 10:16:22 AM By: Kalman Shan DO Entered By: Kalman Shan on 08/07/2022 10:15:48 -------------------------------------------------------------------------------- HxROS Details Patient Name: Date of Service: Danielle Oh G. 08/07/2022 8:00  A M Medical Record Number: [id number] Patient Account Number: [id number] Date of Birth/Sex: Treating RN: 1956/10/16 (66 y.o. Tonita Phoenix, Lauren Primary Care Provider: Earnestine Leys Other Clinician: Referring Provider: Treating Provider/Extender: Jacob Moores in Treatment: 0 Information Obtained From Patient Caregiver Chart Constitutional Symptoms (General Health) Complaints and Symptoms: Negative for: Fatigue; Fever; Chills; Marked Weight Change Eyes Complaints and Symptoms: Negative for: Dry Eyes; Vision Changes; Glasses / Contacts Medical History: Positive for: Cataracts Ear/Nose/Mouth/Throat Complaints and Symptoms: Negative for: Chronic sinus problems or rhinitis Respiratory Complaints and Symptoms: Negative for: Chronic or frequent coughs; Shortness of Breath Medical History: Positive for: Sleep Apnea - no c pap Gastrointestinal Complaints and Symptoms: Negative for: Frequent diarrhea; Nausea; Vomiting Genitourinary Complaints and Symptoms: Negative for: Frequent urination Integumentary (Skin) Complaints and Symptoms: Positive for: Wounds Musculoskeletal Complaints and Symptoms: Negative for: Muscle Pain; Muscle Weakness Hematologic/Lymphatic Cardiovascular Medical History: Positive for: Congestive Heart Failure; Hypertension; Peripheral Arterial Disease; Peripheral Venous Disease Past Medical History Notes: hyperlipidemia Endocrine Medical History: Positive for: Type II Diabetes Past Medical History Notes: pituitary adenoma and thyroid disease hypothyroidism Treated with: Insulin, Oral agents Blood sugar tested every day: Yes Tested : Immunological Neurologic Oncologic HBO Extended History Items Eyes: Cataracts Immunizations Implantable Devices No devices added Hospitalization / Surgery History Type of Hospitalization/Surgery breast surgery gall bladder surgery tube tied hysterectomy Family and Social History Unknown  History: Yes; Never smoker; Marital Status - Single; Alcohol Use: Never; Drug Use: No History; Caffeine Use: Moderate - coffee; Financial Concerns: No; Food, Clothing or Shelter Needs: No; Support System Lacking: No; Transportation Concerns: No Electronic Signature(s) Signed: 08/07/2022 10:16:22 AM By: Kalman Shan DO Signed: 08/08/2022 4:47:31 PM By: Rhae Hammock RN Entered By: Rhae Hammock on 08/07/2022 08:32:16 -------------------------------------------------------------------------------- SuperBill Details Patient Name: Date of Service: Danielle Schroeder 08/07/2022 Medical Record Number: [id number] Patient Account Number: [id number] Date of Birth/Sex: Treating RN: 27-Aug-1956 (66 y.o. F) Primary Care Provider: Earnestine Leys Other Clinician: Referring Provider: Treating Provider/Extender: Jacob Moores in Treatment: 0 Diagnosis Coding ICD-10 Codes Code Description I87.2 Venous insufficiency (chronic) (peripheral) E11.622 Type 2 diabetes mellitus with other skin ulcer I10 Essential (primary) hypertension Facility Procedures Physician Procedures : CPT4 Code Description Modifier [id number] - WC PHYS LEVEL 4 - NEW PT ICD-10 Diagnosis Description I87.2 Venous insufficiency (chronic) (peripheral) E11.622 Type 2 diabetes mellitus with other skin ulcer I10 Essential (primary) hypertension Quantity: 1 Electronic Signature(s) Signed: 08/08/2022 11:12:17 AM By: Kalman Shan DO Signed: 08/08/2022 4:47:31 PM By: Rhae Hammock RN Previous Signature: 08/07/2022 10:16:22 AM Version By: Kalman Shan DO Entered By: Rhae Hammock on 08/07/2022 16:31:53

## 2022-08-08 NOTE — Progress Notes (Signed)
Danielle Schroeder, Danielle Schroeder ([id number]) Visit Report for 08/07/2022 Abuse Risk Screen Details Patient Name: Date of Service: Danielle Schroeder, Danielle Schroeder 08/07/2022 8:00 A M Medical Record Number: [id number] Patient Account Number: [id number] Date of Birth/Sex: Treating RN: 31-May-1956 (66 y.o. Tonita Phoenix, Lauren Primary Care Gideon Burstein: Earnestine Leys Other Clinician: Referring Lorrinda Ramstad: Treating Catheleen Langhorne/Extender: Jacob Moores in Treatment: 0 Abuse Risk Screen Items Answer ABUSE RISK SCREEN: Has anyone close to you tried to hurt or harm you recentlyo No Do you feel uncomfortable with anyone in your familyo No Has anyone forced you do things that you didnt want to doo No Electronic Signature(s) Signed: 08/08/2022 4:47:31 PM By: Rhae Hammock RN Entered By: Rhae Hammock on 08/07/2022 08:33:31 -------------------------------------------------------------------------------- Activities of Daily Living Details Patient Name: Date of Service: Danielle Schroeder, Danielle Schroeder 08/07/2022 8:00 A M Medical Record Number: [id number] Patient Account Number: [id number] Date of Birth/Sex: Treating RN: [birth year] (66 y.o. Tonita Phoenix, Lauren Primary Care Hilliard Borges: Earnestine Leys Other Clinician: Referring Zala Degrasse: Treating Lasheka Kempner/Extender: Jacob Moores in Treatment: 0 Activities of Daily Living Items Answer Activities of Daily Living (Please select one for each item) Drive Automobile Completely Able T Medications ake Completely Able Use T elephone Completely Able Care for Appearance Completely Able Use T oilet Completely Able Bath / Shower Completely Able Dress Self Completely Able Feed Self Completely Able Walk Completely Able Get In / Out Bed Completely Able Housework Completely Able Prepare Meals Completely [place] Completely Able Shop for Self Completely Able Electronic Signature(s) Signed: 08/08/2022 4:47:31 PM By: Rhae Hammock RN Entered By:  Rhae Hammock on 08/07/2022 08:33:49 -------------------------------------------------------------------------------- Education Screening Details Patient Name: Date of Service: Danielle Field. 08/07/2022 8:00 A M Medical Record Number: [id number] Patient Account Number: [id number] Date of Birth/Sex: Treating RN: Jun 04, 1956 (66 y.o. Tonita Phoenix, Lauren Primary Care Jerrianne Hartin: Earnestine Leys Other Clinician: Referring Janisse Ghan: Treating Eevee Borbon/Extender: Jacob Moores in Treatment: 0 Primary Learner Assessed: Patient Learning Preferences/Education Level/Primary Language Learning Preference: Explanation, Demonstration, Communication Board, Printed Material Highest Education Level: High School Preferred Language: English Cognitive Barrier Language Barrier: No Translator Needed: No Memory Deficit: No Emotional Barrier: No Cultural/Religious Beliefs Affecting Medical Care: No Physical Barrier Impaired Vision: No Impaired Hearing: No Decreased Hand dexterity: No Knowledge/Comprehension Knowledge Level: High Comprehension Level: High Ability to understand written instructions: High Ability to understand verbal instructions: High Motivation Anxiety Level: Calm Cooperation: Cooperative Education Importance: Denies Need Interest in Health Problems: Asks Questions Perception: Coherent Willingness to Engage in Self-Management High Activities: Readiness to Engage in Self-Management High Activities: Electronic Signature(s) Signed: 08/08/2022 4:47:31 PM By: Rhae Hammock RN Entered By: Rhae Hammock on 08/07/2022 08:34:17 -------------------------------------------------------------------------------- Fall Risk Assessment Details Patient Name: Date of Service: Danielle Oh G. 08/07/2022 8:00 A M Medical Record Number: [id number] Patient Account Number: [id number] Date of Birth/Sex: Treating RN: 15-Sep-1956 (66 y.o. Tonita Phoenix,  Lauren Primary Care Sinan Tuch: Earnestine Leys Other Clinician: Referring Adrain Nesbit: Treating Rohil Lesch/Extender: Jacob Moores in Treatment: 0 Fall Risk Assessment Items Have you had 2 or more falls in the last 12 monthso 0 No Have you had any fall that resulted in injury in the last 12 monthso 0 No FALLS RISK SCREEN History of falling - immediate or within 3 months 0 No Secondary diagnosis (Do you have 2 or more medical diagnoseso) 0 No Ambulatory aid None/bed rest/wheelchair/nurse 0 No Crutches/cane/walker 0 No Furniture 0 No Intravenous therapy Access/Saline/Heparin Lock 0 No Gait/Transferring Normal/ bed rest/ wheelchair 0 No Weak (  short steps with or without shuffle, stooped but able to lift head while walking, may seek 0 No support from furniture) Impaired (short steps with shuffle, may have difficulty arising from chair, head down, impaired 0 No balance) Mental Status Oriented to own ability 0 No Electronic Signature(s) Signed: 08/08/2022 4:47:31 PM By: Rhae Hammock RN Entered By: Rhae Hammock on 08/07/2022 08:34:23 -------------------------------------------------------------------------------- Foot Assessment Details Patient Name: Date of Service: Danielle Field. 08/07/2022 8:00 A M Medical Record Number: [id number] Patient Account Number: [id number] Date of Birth/Sex: Treating RN: 1956-10-11 (66 y.o. Tonita Phoenix, Lauren Primary Care Yohanna Tow: Earnestine Leys Other Clinician: Referring Danielle Schroeder: Treating Kiya Eno/Extender: Jacob Moores in Treatment: 0 Foot Assessment Items Site Locations + = Sensation present, - = Sensation absent, C = Callus, U = Ulcer R = Redness, W = Warmth, M = Maceration, PU = Pre-ulcerative lesion F = Fissure, S = Swelling, D = Dryness Assessment Right: Left: Other Deformity: No No Prior Foot Ulcer: No No Prior Amputation: No No Charcot Joint: No No Ambulatory Status: Ambulatory Without  Help Gait: Steady Electronic Signature(s) Signed: 08/08/2022 4:47:31 PM By: Rhae Hammock RN Entered By: Rhae Hammock on 08/07/2022 08:34:59 -------------------------------------------------------------------------------- Nutrition Risk Screening Details Patient Name: Date of Service: Danielle Field. 08/07/2022 8:00 A M Medical Record Number: [id number] Patient Account Number: [id number] Date of Birth/Sex: Treating RN: 1956-10-01 (66 y.o. Tonita Phoenix, Lauren Primary Care Danielle Schroeder: Earnestine Leys Other Clinician: Referring Danielle Schroeder: Treating Viraaj Vorndran/Extender: Jacob Moores in Treatment: 0 Height (in): 67 Weight (lbs): 234 Body Mass Index (BMI): 36.6 Nutrition Risk Screening Items Score Screening NUTRITION RISK SCREEN: I have an illness or condition that made me change the kind and/or amount of food I eat 0 No I eat fewer than two meals per day 0 No I eat few fruits and vegetables, or milk products 0 No I have three or more drinks of beer, liquor or wine almost every day 0 No I have tooth or mouth problems that make it hard for me to eat 0 No I don't always have enough money to buy the food I need 0 No I eat alone most of the time 0 No I take three or more different prescribed or over-the-counter drugs a day 0 No Without wanting to, I have lost or gained 10 pounds in the last six months 0 No I am not always physically able to shop, cook and/or feed myself 0 No Nutrition Protocols Good Risk Protocol 0 No interventions needed Moderate Risk Protocol High Risk Proctocol Risk Level: Good Risk Score: 0 Electronic Signature(s) Signed: 08/08/2022 4:47:31 PM By: Rhae Hammock RN Entered By: Rhae Hammock on 08/07/2022 08:34:29

## 2022-08-14 ENCOUNTER — Inpatient Hospital Stay: Admission: RE | Admit: 2022-08-14 | Payer: Medicare HMO | Source: Ambulatory Visit

## 2022-08-21 DIAGNOSIS — I10 Essential (primary) hypertension: Secondary | ICD-10-CM | POA: Diagnosis not present

## 2022-08-21 DIAGNOSIS — Z794 Long term (current) use of insulin: Secondary | ICD-10-CM | POA: Diagnosis not present

## 2022-08-21 DIAGNOSIS — E119 Type 2 diabetes mellitus without complications: Secondary | ICD-10-CM | POA: Diagnosis not present

## 2022-08-21 DIAGNOSIS — I872 Venous insufficiency (chronic) (peripheral): Secondary | ICD-10-CM | POA: Diagnosis not present

## 2022-08-22 ENCOUNTER — Telehealth: Payer: Self-pay

## 2022-08-22 NOTE — Telephone Encounter (Signed)
I have called the provider  back on  [phone] 08/22/2022 at 1:24 pm, (this was the office number)  initially I chose  the triage nurse line, there was no person on the other line but rather to leave a voicemail.  I did hang up and call the same number again, this time I picked option #1 to see if I can speak to somebody and after waiting for 10 minutes and I had to patients already waiting on me to be seen I had to hang up     [place], MD  [facility]  [facility] [address] [place], [place] [address] Phone: [phone] FAX: [phone]

## 2022-08-22 NOTE — Telephone Encounter (Signed)
Earnestine Leys, NP from [facility] would like to speak with you regarding patient. CB [phone].

## 2022-08-29 ENCOUNTER — Other Ambulatory Visit: Payer: Self-pay | Admitting: Internal Medicine

## 2022-08-29 DIAGNOSIS — I129 Hypertensive chronic kidney disease with stage 1 through stage 4 chronic kidney disease, or unspecified chronic kidney disease: Secondary | ICD-10-CM | POA: Diagnosis not present

## 2022-08-29 DIAGNOSIS — N183 Chronic kidney disease, stage 3 unspecified: Secondary | ICD-10-CM | POA: Diagnosis not present

## 2022-08-29 DIAGNOSIS — E1122 Type 2 diabetes mellitus with diabetic chronic kidney disease: Secondary | ICD-10-CM | POA: Diagnosis not present

## 2022-08-29 DIAGNOSIS — E785 Hyperlipidemia, unspecified: Secondary | ICD-10-CM | POA: Diagnosis not present

## 2022-08-29 DIAGNOSIS — E876 Hypokalemia: Secondary | ICD-10-CM | POA: Diagnosis not present

## 2022-08-29 DIAGNOSIS — E1129 Type 2 diabetes mellitus with other diabetic kidney complication: Secondary | ICD-10-CM | POA: Diagnosis not present

## 2022-09-05 ENCOUNTER — Encounter: Payer: Self-pay | Admitting: Internal Medicine

## 2022-09-05 ENCOUNTER — Ambulatory Visit (INDEPENDENT_AMBULATORY_CARE_PROVIDER_SITE_OTHER): Payer: Medicare HMO | Admitting: Internal Medicine

## 2022-09-05 VITALS — BP 130/80 | HR 81 | Ht 67.0 in | Wt 234.0 lb

## 2022-09-05 DIAGNOSIS — E1122 Type 2 diabetes mellitus with diabetic chronic kidney disease: Secondary | ICD-10-CM

## 2022-09-05 DIAGNOSIS — E039 Hypothyroidism, unspecified: Secondary | ICD-10-CM | POA: Diagnosis not present

## 2022-09-05 DIAGNOSIS — E1165 Type 2 diabetes mellitus with hyperglycemia: Secondary | ICD-10-CM

## 2022-09-05 DIAGNOSIS — E220 Acromegaly and pituitary gigantism: Secondary | ICD-10-CM

## 2022-09-05 DIAGNOSIS — D352 Benign neoplasm of pituitary gland: Secondary | ICD-10-CM

## 2022-09-05 DIAGNOSIS — N1832 Chronic kidney disease, stage 3b: Secondary | ICD-10-CM

## 2022-09-05 DIAGNOSIS — Z794 Long term (current) use of insulin: Secondary | ICD-10-CM | POA: Diagnosis not present

## 2022-09-05 LAB — POCT GLUCOSE (DEVICE FOR HOME USE): POC Glucose: 134 mg/dl — AB (ref 70–99)

## 2022-09-05 MED ORDER — NOVOLOG FLEXPEN 100 UNIT/ML ~~LOC~~ SOPN: 4.0000 [IU] | PEN_INJECTOR | Freq: Three times a day (TID) | SUBCUTANEOUS | 6 refills | Status: DC

## 2022-09-05 MED ORDER — PEN NEEDLES 31G X 5 MM MISC: 1.0000 | Freq: Four times a day (QID) | 3 refills | Status: DC

## 2022-09-05 MED ORDER — CABERGOLINE 0.5 MG PO TABS: 0.5000 mg | ORAL_TABLET | ORAL | 3 refills | Status: DC

## 2022-09-05 NOTE — Progress Notes (Signed)
Name: Danielle Schroeder  MRN/ DOB: [id number], [birth year]    Age/ Sex: 66 y.o., female     PCP: Joycelyn Man, FNP   Reason for Endocrinology Evaluation: Pituitary adenoma      Initial Endocrinology Clinic Visit: 10/06/2019    PATIENT IDENTIFIER: Danielle Schroeder is a 66 y.o., female with a past medical history of T2DM, dyslipidemia, pituitary adenoma, acromegaly, central hypothyroidism. She has followed with [facility] since 10/06/2019 for consultative assistance with management of her pituitary adenoma.   HISTORICAL SUMMARY:  Per patient she believes she had pituitary adenoma for >40 yrs. she does endorse history of subcutaneous injections in the past which sound like octreotide.  She was seen by Dr. Venetia Constable in 2020 for evaluation of pituitary adenoma. 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 She was deemed not a surgical candidate due to high risk for complication She is S/P radiation treatment in 2021 , not a candidate for surgical intervention    DM HISTORY : She was diagnosed with DM since the age of 34 units. She has been on insulin for years. Her A1c ranges from 8.2 % in 2023 to 8.8% in 2022  THYROID HISTORY: She has been diagnosed with thyroid disease in 2010 and has been on LT-for replacement She was also diagnosed with multinodular goiter in December 2020 based on thyroid ultrasound, no history of FNA   No FH of thyroid disease  Has Fh of DM  Denies FH of pituitary disease   SUBJECTIVE:     Today (09/05/2022):  Danielle Schroeder is here for Acromegaly , hypothyroidism and T2DM.    She checks glucose once daily , did not bring meter again today   She has reduced consumption of sugar-sweetened beverages  She continues with increase swelling of the feet , but not in the hands  Excessive sweating and heat  intolerance has decreased  Denies joint pain  Denies diarrhea , she is constipated  Denies local neck symptoms  She follows with Nephrology Dr. Johnney Ou     HOME ENDOCRINE MEDICATION  Octreotide 100 mcg TID Levothyroxine 88 mcg daily  Basaglar 20 units BID Tradjenta 5 mg daily       HISTORY:  Past Medical History:  Past Medical History:  Diagnosis Date   Cataract    bilateral lens implants   CHF (congestive heart failure) ([place])    Diabetes mellitus without complication ([place])    Hyperlipidemia    Hypertension    Pituitary adenoma with extrasellar extension ([place])    Sleep apnea    no c-pap   Thyroid disease    hypothyroidism   Past Surgical History:  Past Surgical History:  Procedure Laterality Date   BREAST SURGERY     CHOLECYSTECTOMY     TONSILLECTOMY     TUBAL LIGATION     Social History:  reports that she has never smoked. She has never used smokeless tobacco. She reports that she does not drink alcohol and does not use drugs. Family History:  Family History  Problem Relation Age of Onset   Thyroid disease Neg Hx    Colon cancer Neg Hx    Esophageal cancer Neg Hx    Rectal cancer Neg Hx    Stomach cancer Neg Hx      HOME MEDICATIONS: Allergies as of 09/05/2022   No Known Allergies  Medication List        Accurate as of September 05, 2022  2:08 PM. If you have any questions, ask your nurse or doctor.          STOP taking these medications    amLODipine 5 MG tablet Commonly known as: NORVASC Stopped by: Dorita Sciara, MD   linagliptin 5 MG Tabs tablet Commonly known as: Tradjenta Stopped by: Dorita Sciara, MD   metolazone 5 MG tablet Commonly known as: ZAROXOLYN Stopped by: Dorita Sciara, MD       TAKE these medications    aspirin EC 81 MG tablet Take 81 mg by mouth daily.   Basaglar KwikPen 100 UNIT/ML Inject 20 Units into the skin 2 (two) times daily.   cabergoline 0.5 MG tablet Commonly known as:  DOSTINEX Take 1 tablet (0.5 mg total) by mouth 2 (two) times a week. Start taking on: September 07, 2022 Started by: Dorita Sciara, MD   carvedilol 25 MG tablet Commonly known as: COREG Take 25 mg by mouth 2 (two) times daily with a meal.   furosemide 40 MG tablet Commonly known as: LASIX TAKE 1 TABLET BY MOUTH TWICE A DAY   glucose blood test strip 1 each by Other route 3 (three) times daily. Use as instructed   levothyroxine 88 MCG tablet Commonly known as: SYNTHROID TAKE 1 TABLET BY MOUTH EVERY DAY   losartan 100 MG tablet Commonly known as: COZAAR Take 100 mg by mouth daily.   NovoLOG FlexPen 100 UNIT/ML FlexPen Generic drug: insulin aspart Inject 4 Units into the skin 3 (three) times daily with meals. Started by: Dorita Sciara, MD   Octreotide Acetate 100 MCG/ML Sosy Inject 1 mL (100 mcg total) into the skin 3 (three) times daily.   Pen Needles 31G X 5 MM Misc 1 each by Does not apply route in the morning, at noon, in the evening, and at bedtime. What changed: when to take this Changed by: Dorita Sciara, MD   potassium chloride 10 MEQ tablet Commonly known as: KLOR-CON TAKE 1 TABLET BY MOUTH EVERY DAY.   silver sulfADIAZINE 1 % cream Commonly known as: SILVADENE Apply topically 2 (two) times daily.   simvastatin 20 MG tablet Commonly known as: ZOCOR Take 20 mg by mouth daily.   SYRINGE-NEEDLE (DISP) 3 ML 25G X 1" 3 ML Misc Inject 1 ml into the skin 3 times daily.   Vitamin D (Cholecalciferol) 25 MCG (1000 UT) Caps Take 2 capsules by mouth daily.          OBJECTIVE:   PHYSICAL EXAM: VS: BP 130/80 (BP Location: Left Arm, Patient Position: Sitting, Cuff Size: Large)   Pulse 81   Ht '5\' 7"'$  (1.702 m)   Wt 234 lb (106.1 kg)   SpO2 99%   BMI 36.65 kg/m    EXAM: General: Pt appears well and is in NAD  Neck: General: Supple without adenopathy. Thyroid: Thyroid size normal.  No goiter or nodules appreciated.   Lungs: Clear with  good BS bilat with no rales, rhonchi, or wheezes  Heart: Auscultation: RRR.  Abdomen: Normoactive bowel sounds, soft, nontender, without masses or organomegaly palpable  Extremities:  BL LE: No pretibial edema normal ROM and strength.  Mental Status: Judgment, insight: Intact Orientation: Oriented to time, place, and person Mood and affect: No depression, anxiety, or agitation     DATA REVIEWED:  Latest Reference Range & Units 05/02/22 15:22  Sodium 135 - 145 mEq/L 140  Potassium 3.5 - 5.1 mEq/L 4.5  Chloride 96 - 112 mEq/L 102  CO2 19 - 32 mEq/L 29  Glucose 70 - 99 mg/dL 84  BUN 6 - 23 mg/dL 17  Creatinine 0.40 - 1.20 mg/dL 1.55 (H)  Calcium 8.4 - 10.5 mg/dL 9.7  GFR >60.00 mL/min 35.13 (L)     Latest Reference Range & Units 05/02/22 15:22  C206 ACTH 6 - 50 pg/mL 18  Cortisol, Plasma ug/dL 8.7  Growth Hormone < OR = 7.1 ng/mL 1.3  FSH mIU/ML 0.4  Prolactin ng/mL 3.0  Glucose 70 - 99 mg/dL 84  TSH 0.35 - 5.50 uIU/mL 0.02 (L)  T4,Free(Direct) 0.60 - 1.60 ng/dL 0.99     Brain MRI 07/24/2022   Sella: Stable distorted appearance of sella with thickened infundibulum contiguous with enhancing tissue at the right. Heterogeneous tissue on the left extending into the anterior suprasellar cistern and through a defect in the anterior left sellar floor. Unchanged probable involvement of the left cavernous sinus. Unchanged mild tethering of the optic chiasm.   Brain: There is no acute infarction or intracranial hemorrhage. There is no intracranial mass, mass effect, or edema. There is no hydrocephalus or extra-axial fluid collection. Ventricles and sulci are normal in size and configuration. Patchy foci of T2 hyperintensity in the supratentorial white matter are nonspecific but probably reflects stable chronic microvascular ischemic changes. Chronic T1 shortening, susceptibility, and T2 hyperintensity in the cerebellar hemispheres. Chronic T1 shortening and susceptibility  in the basal ganglia bilaterally. These areas of susceptibility likely reflect mineralization. No abnormal enhancement.   Vascular: Major vessel flow voids at the skull base are preserved.   Skull and upper cervical spine: Normal marrow signal is preserved.   Sinuses/Orbits: Paranasal sinus mucosal thickening including lobular thickening in the frontal sinuses. Orbits are unremarkable.   Other: Mastoid air cells are clear.   IMPRESSION: Stable appearance of treated sella.     Thyroid ultrasound 04/22/2021   Estimated total number of nodules >/= 1 cm: 1   Number of spongiform nodules >/=  2 cm not described below (TR1): 0   Number of mixed cystic and solid nodules >/= 1.5 cm not described below ([place]): 1   _________________________________________________________   Nodule # 4:   Prior biopsy: No   Location: Left; Mid   Maximum size: 2.0 cm; Other 2 dimensions: 1.4 x 0.9 cm, previously, 1.8 x 1.1 x 1.0 cm   Composition: solid/almost completely solid (2)   Echogenicity: isoechoic (1)   Shape: not taller-than-wide (0)   Margins: ill-defined (0)   Echogenic foci: none (0)   ACR TI-RADS total points: 3.   ACR TI-RADS risk category:  TR3 (3 points).   Significant change in size (>/= 20% in two dimensions and minimal increase of 2 mm): No   Change in features: No   Change in ACR TI-RADS risk category: No   ACR TI-RADS recommendations:   *Given size (>/= 1.5 - 2.4 cm) and appearance, a follow-up ultrasound in 1 year should be considered based on TI-RADS criteria.   _________________________________________________________   Unchanged appearance of the previously visualized multifocal subcentimeter solid and right inferior solid cystic thyroid nodule which again appear benign and do not warrant additional ultrasound follow-up or tissue sampling.   IMPRESSION: 1. Similar appearing multinodular goiter. 2. Unchanged appearance of solid, left mid thyroid  nodule (labeled 4, 2.0 cm) which meets criteria (TI-RADS category 3) for 1 year ultrasound follow-up. This study marks 2 years stability.  ASSESSMENT / PLAN /  RECOMMENDATIONS:   Acromegaly   -She has been evaluated by neurosurgery and was deemed  not to be a surgical candidate -She is status post radiation treatment in 2021 -She has been on octreotide 3 times daily per subcutaneous injections, unable to switch to long-acting as it is cost prohibitive  and  will cause hardship on the patient as it has to be given through a health professional -Growth hormone level is slightly above goal <1 ng/mL , IGF-1 normal  -Today the patient tells me that her son had told her that octreotide comes in a pill form, I assured the patient that octreotide does not come in a pill form yet (but it appears that there are clinical trials for this)  but there is another oral medication for acromegaly which is pegvisomant, which she is not a candidate for due to 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  Central hypothyroidism  -Free T4 is normal -No changes today  Medication Continue levothyroxine 88 mcg daily  3. T2DM, poorly controlled with CKD III complications:  -N5A 9.5 %, which has increased from 8.2%  - Metformin discontinued due to low GFR in June 2023 -We will stop Tradjenta - Not interested In CGM technology, I briefly demonstrated Dexcom and the receiver that comes with it -I have recommended starting prandial dose of insulin with each meal -I will also change Basaglar to once daily dosing as below  Medication Stop Tradjenta Inch Basaglar 40 units ONCE  daily Start NovoLog 4 units 3 times daily before every meal     follow-up in 4  months   Signed electronically by: Mack Guise, MD  [facility]  [facility] [address] [place], [place] [address] Phone: [phone] FAX: [phone]      CC: Joycelyn Man, [address] [place] [address] Phone: [phone]  Fax: [phone]   Return to Endocrinology clinic as below: Future Appointments  Date Time Provider [place]  09/06/2022 11:40 AM GI-BCG MM 3 GI-BCGMM GI-BREAST CE  09/26/2022 10:20 AM GI-315 CT 1 GI-315CT GI-315 W. WE  01/10/2023 10:30 AM Illeana Edick, Melanie Crazier, MD [facility]PC-LBENDO None  03/26/2023 10:45 AM Rankin, Clent Demark, MD RDE-RDE None

## 2022-09-05 NOTE — Patient Instructions (Addendum)
STOP Tradjenta  Start cabergoline 0.5 mg, 1 tablet TWICE WEEKLY ( Wednesday and Sunday) Start Novolog 4 units with Breakfast, Lunch and Supper Take  Basaglar 40 units ONCE DAILY     HOW TO TREAT LOW BLOOD SUGARS (Blood sugar LESS THAN 70 MG/DL) Please follow the RULE OF 15 for the treatment of hypoglycemia treatment (when your (blood sugars are less than 70 mg/dL)   STEP 1: Take 15 grams of carbohydrates when your blood sugar is low, which includes:  3-4 GLUCOSE TABS  OR 3-4 OZ OF JUICE OR REGULAR SODA OR ONE TUBE OF GLUCOSE GEL    STEP 2: RECHECK blood sugar in 15 MINUTES STEP 3: If your blood sugar is still low at the 15 minute recheck --> then, go back to STEP 1 and treat AGAIN with another 15 grams of carbohydrates.

## 2022-09-06 ENCOUNTER — Ambulatory Visit
Admission: RE | Admit: 2022-09-06 | Discharge: 2022-09-06 | Disposition: A | Discharge status: Home / Self Care | Payer: Medicare HMO | Source: Ambulatory Visit | Attending: Family Medicine | Admitting: Family Medicine

## 2022-09-06 DIAGNOSIS — Z1231 Encounter for screening mammogram for malignant neoplasm of breast: Secondary | ICD-10-CM | POA: Diagnosis not present

## 2022-09-21 DIAGNOSIS — I10 Essential (primary) hypertension: Secondary | ICD-10-CM | POA: Diagnosis not present

## 2022-09-21 DIAGNOSIS — Z794 Long term (current) use of insulin: Secondary | ICD-10-CM | POA: Diagnosis not present

## 2022-09-21 DIAGNOSIS — I872 Venous insufficiency (chronic) (peripheral): Secondary | ICD-10-CM | POA: Diagnosis not present

## 2022-09-21 DIAGNOSIS — E119 Type 2 diabetes mellitus without complications: Secondary | ICD-10-CM | POA: Diagnosis not present

## 2022-09-21 DIAGNOSIS — L853 Xerosis cutis: Secondary | ICD-10-CM | POA: Diagnosis not present

## 2022-09-26 ENCOUNTER — Ambulatory Visit
Admission: RE | Admit: 2022-09-26 | Discharge: 2022-09-26 | Disposition: A | Discharge status: Home / Self Care | Payer: Medicare HMO | Source: Ambulatory Visit | Attending: Family | Admitting: Family

## 2022-09-26 ENCOUNTER — Encounter: Payer: Medicare HMO | Admitting: Vascular Surgery

## 2022-09-26 DIAGNOSIS — I739 Peripheral vascular disease, unspecified: Secondary | ICD-10-CM

## 2022-09-26 DIAGNOSIS — I2584 Coronary atherosclerosis due to calcified coronary lesion: Secondary | ICD-10-CM | POA: Diagnosis not present

## 2022-09-26 DIAGNOSIS — R6889 Other general symptoms and signs: Secondary | ICD-10-CM

## 2022-09-26 MED ORDER — IOPAMIDOL (ISOVUE-370) INJECTION 76%
68.0000 mL | Freq: Once | INTRAVENOUS | Status: AC | PRN
Start: 2022-09-26 — End: 2022-09-26
  Administered 2022-09-26: 68 mL via INTRAVENOUS

## 2022-11-01 ENCOUNTER — Other Ambulatory Visit: Payer: Self-pay | Admitting: Cardiology

## 2022-11-15 ENCOUNTER — Telehealth: Payer: Self-pay

## 2022-11-15 NOTE — Telephone Encounter (Signed)
Per [facility] Octreotide needs a PA. Previous authorization expired on 11/12/22.  [phone] -number to call for authorization

## 2022-11-17 ENCOUNTER — Other Ambulatory Visit: Payer: Self-pay | Admitting: Cardiology

## 2022-11-17 NOTE — Telephone Encounter (Signed)
[facility] called again to check on status of PA being submitted.

## 2022-11-20 ENCOUNTER — Telehealth: Payer: Self-pay | Admitting: Pharmacy Technician

## 2022-11-20 ENCOUNTER — Other Ambulatory Visit (HOSPITAL_COMMUNITY): Payer: Self-pay

## 2022-11-20 NOTE — Telephone Encounter (Signed)
Pharmacy Patient Advocate Encounter   Received notification from [facility] that prior authorization for Octreotide is required/requested.   PA submitted on 11/20/22 to (ins) [employer] part D via [place]  PA Case ID: [id number]  Prior Authorization has been approved.    Effective dates: 11/13/22 through 11/13/23  [facility]oke with [facility] to process. Pt's copay is $350. They will contact the pt.

## 2022-11-20 NOTE — Telephone Encounter (Signed)
[facility] calling again regarding PA for Octreotide.

## 2022-12-19 DIAGNOSIS — L603 Nail dystrophy: Secondary | ICD-10-CM | POA: Diagnosis not present

## 2022-12-19 DIAGNOSIS — L84 Corns and callosities: Secondary | ICD-10-CM | POA: Diagnosis not present

## 2022-12-19 DIAGNOSIS — E1151 Type 2 diabetes mellitus with diabetic peripheral angiopathy without gangrene: Secondary | ICD-10-CM | POA: Diagnosis not present

## 2022-12-19 DIAGNOSIS — I739 Peripheral vascular disease, unspecified: Secondary | ICD-10-CM | POA: Diagnosis not present

## 2023-01-10 ENCOUNTER — Ambulatory Visit: Payer: Medicare HMO | Admitting: Internal Medicine

## 2023-01-15 NOTE — Progress Notes (Unsigned)
Name: Danielle Schroeder  MRN/ DOB: [id number], 1956/10/31    Age/ Sex: 67 y.o., female     PCP: Joycelyn Man, FNP   Reason for Endocrinology Evaluation: Pituitary adenoma      Initial Endocrinology Clinic Visit: 10/06/2019    PATIENT IDENTIFIER: Danielle Schroeder is a 67 y.o., female with a past medical history of T2DM, dyslipidemia, pituitary adenoma, acromegaly, central hypothyroidism. She has followed with [facility] since 10/06/2019 for consultative assistance with management of her pituitary adenoma.   HISTORICAL SUMMARY:  Per patient she believes she had pituitary adenoma for >40 yrs. she does endorse history of subcutaneous injections in the past which sound like octreotide.  She was seen by Dr. Venetia Constable in 2020 for evaluation of pituitary adenoma. 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 She was deemed not a surgical candidate due to high risk for complication She is S/P radiation treatment in 2021 , not a candidate for surgical intervention    DM HISTORY : She was diagnosed with DM since the age of 29 units. She has been on insulin for years. Her A1c ranges from 8.2 % in 2023 to 8.8% in 2022  THYROID HISTORY: She has been diagnosed with thyroid disease in 2010 and has been on LT-for replacement She was also diagnosed with multinodular goiter in December 2020 based on thyroid ultrasound, no history of FNA   No FH of thyroid disease  Has Fh of DM  Denies FH of pituitary disease   SUBJECTIVE:     Today (01/16/2023):  Danielle Schroeder is here for Acromegaly , hypothyroidism and T2DM.  She checks glucose once daily , did not bring meter again today    She continues with increase swelling of the feet , but not in the hands  Excessive sweating and heat intolerance has decreased  She has leg pains  Denies diarrhea or  loose stools  Denies local neck symptoms  She follows with Nephrology Dr. Johnney Ou     HOME ENDOCRINE MEDICATION  Octreotide 100 mcg TID Levothyroxine 88 mcg daily  Basaglar 40 units daily  Novolog 4 units TIDQAC      HISTORY:  Past Medical History:  Past Medical History:  Diagnosis Date   Cataract    bilateral lens implants   CHF (congestive heart failure) ([place])    Diabetes mellitus without complication ([place])    Hyperlipidemia    Hypertension    Pituitary adenoma with extrasellar extension ([place])    Sleep apnea    no c-pap   Thyroid disease    hypothyroidism   Past Surgical History:  Past Surgical History:  Procedure Laterality Date   BREAST SURGERY     CHOLECYSTECTOMY     TONSILLECTOMY     TUBAL LIGATION     Social History:  reports that she has never smoked. She has never used smokeless tobacco. She reports that she does not drink alcohol and does not use drugs. Family History:  Family History  Problem Relation Age of Onset   Thyroid disease Neg Hx    Colon cancer Neg Hx    Esophageal cancer Neg Hx    Rectal cancer Neg Hx    Stomach cancer Neg Hx      HOME MEDICATIONS: Allergies as of 01/16/2023   No Known Allergies      Medication List  Accurate as of January 16, 2023 10:16 AM. If you have any questions, ask your nurse or doctor.          aspirin EC 81 MG tablet Take 81 mg by mouth daily.   Basaglar KwikPen 100 UNIT/ML Inject 20 Units into the skin 2 (two) times daily.   cabergoline 0.5 MG tablet Commonly known as: DOSTINEX Take 1 tablet (0.5 mg total) by mouth 2 (two) times a week.   carvedilol 25 MG tablet Commonly known as: COREG Take 25 mg by mouth 2 (two) times daily with a meal.   furosemide 40 MG tablet Commonly known as: LASIX TAKE 1 TABLET BY MOUTH TWICE A DAY   glucose blood test strip 1 each by Other route 3 (three) times daily. Use as instructed   levothyroxine 88 MCG tablet Commonly known as: SYNTHROID TAKE 1  TABLET BY MOUTH EVERY DAY   losartan 100 MG tablet Commonly known as: COZAAR Take 100 mg by mouth daily.   NIFEdipine 30 MG 24 hr tablet Commonly known as: PROCARDIA-XL/NIFEDICAL-XL Take 30 mg by mouth daily.   NovoLOG FlexPen 100 UNIT/ML FlexPen Generic drug: insulin aspart Inject 4 Units into the skin 3 (three) times daily with meals.   Octreotide Acetate 100 MCG/ML Sosy Inject 1 mL (100 mcg total) into the skin 3 (three) times daily.   Pen Needles 31G X 5 MM Misc 1 each by Does not apply route in the morning, at noon, in the evening, and at bedtime.   potassium chloride 10 MEQ tablet Commonly known as: KLOR-CON TAKE 1 TABLET BY MOUTH EVERY DAY. Please contact our office to schedule an overdue appointment for future refills. [phone]. Thank you ( 1st attempt)   silver sulfADIAZINE 1 % cream Commonly known as: SILVADENE Apply topically 2 (two) times daily.   simvastatin 20 MG tablet Commonly known as: ZOCOR Take 20 mg by mouth daily.   SYRINGE-NEEDLE (DISP) 3 ML 25G X 1" 3 ML Misc Inject 1 ml into the skin 3 times daily.   Vitamin D (Cholecalciferol) 25 MCG (1000 UT) Caps Take 2 capsules by mouth daily.          OBJECTIVE:   PHYSICAL EXAM: VS: BP (!) 140/70 (BP Location: Right Arm, Patient Position: Sitting, Cuff Size: Large)   Pulse 84   Ht '5\' 7"'$  (1.702 m)   Wt 240 lb (108.9 kg)   SpO2 96%   BMI 37.59 kg/m    EXAM: General: Pt appears well and is in NAD  Neck: General: Supple without adenopathy. Thyroid: Thyroid size normal.  No goiter or nodules appreciated.   Lungs: Clear with good BS bilat with no rales, rhonchi, or wheezes  Heart: Auscultation: RRR.  Abdomen: Normoactive bowel sounds, soft, nontender, without masses or organomegaly palpable  Extremities:  BL LE: Stasis dermatitis with trace edema B/L   Mental Status: Judgment, insight: Intact Orientation: Oriented to time, place, and person Mood and affect: No depression, anxiety, or  agitation   DM Foot exam 01/16/2023 The skin of the feet hows deep fissures at the anterior ankles with swelling, erythema and yellowish discharge on the right  The pedal pulses are feeble  The sensation is absent  to a screening 5.07, 10 gram monofilament bilaterally   DATA REVIEWED:  Latest Reference Range & Units 05/02/22 15:22  Sodium 135 - 145 mEq/L 140  Potassium 3.5 - 5.1 mEq/L 4.5  Chloride 96 - 112 mEq/L 102  CO2 19 - 32 mEq/L 29  Glucose 70 -  99 mg/dL 84  BUN 6 - 23 mg/dL 17  Creatinine 0.40 - 1.20 mg/dL 1.55 (H)  Calcium 8.4 - 10.5 mg/dL 9.7  GFR >60.00 mL/min 35.13 (L)     Latest Reference Range & Units 05/02/22 15:22  C206 ACTH 6 - 50 pg/mL 18  Cortisol, Plasma ug/dL 8.7  Growth Hormone < OR = 7.1 ng/mL 1.3  FSH mIU/ML 0.4  Prolactin ng/mL 3.0  Glucose 70 - 99 mg/dL 84  TSH 0.35 - 5.50 uIU/mL 0.02 (L)  T4,Free(Direct) 0.60 - 1.60 ng/dL 0.99     Brain MRI 07/24/2022   Sella: Stable distorted appearance of sella with thickened infundibulum contiguous with enhancing tissue at the right. Heterogeneous tissue on the left extending into the anterior suprasellar cistern and through a defect in the anterior left sellar floor. Unchanged probable involvement of the left cavernous sinus. Unchanged mild tethering of the optic chiasm.   Brain: There is no acute infarction or intracranial hemorrhage. There is no intracranial mass, mass effect, or edema. There is no hydrocephalus or extra-axial fluid collection. Ventricles and sulci are normal in size and configuration. Patchy foci of T2 hyperintensity in the supratentorial white matter are nonspecific but probably reflects stable chronic microvascular ischemic changes. Chronic T1 shortening, susceptibility, and T2 hyperintensity in the cerebellar hemispheres. Chronic T1 shortening and susceptibility in the basal ganglia bilaterally. These areas of susceptibility likely reflect mineralization. No abnormal enhancement.    Vascular: Major vessel flow voids at the skull base are preserved.   Skull and upper cervical spine: Normal marrow signal is preserved.   Sinuses/Orbits: Paranasal sinus mucosal thickening including lobular thickening in the frontal sinuses. Orbits are unremarkable.   Other: Mastoid air cells are clear.   IMPRESSION: Stable appearance of treated sella.     Thyroid ultrasound 04/22/2021   Estimated total number of nodules >/= 1 cm: 1   Number of spongiform nodules >/=  2 cm not described below (TR1): 0   Number of mixed cystic and solid nodules >/= 1.5 cm not described below ([place]): 1   _________________________________________________________   Nodule # 4:   Prior biopsy: No   Location: Left; Mid   Maximum size: 2.0 cm; Other 2 dimensions: 1.4 x 0.9 cm, previously, 1.8 x 1.1 x 1.0 cm   Composition: solid/almost completely solid (2)   Echogenicity: isoechoic (1)   Shape: not taller-than-wide (0)   Margins: ill-defined (0)   Echogenic foci: none (0)   ACR TI-RADS total points: 3.   ACR TI-RADS risk category:  TR3 (3 points).   Significant change in size (>/= 20% in two dimensions and minimal increase of 2 mm): No   Change in features: No   Change in ACR TI-RADS risk category: No   ACR TI-RADS recommendations:   *Given size (>/= 1.5 - 2.4 cm) and appearance, a follow-up ultrasound in 1 year should be considered based on TI-RADS criteria.   _________________________________________________________   Unchanged appearance of the previously visualized multifocal subcentimeter solid and right inferior solid cystic thyroid nodule which again appear benign and do not warrant additional ultrasound follow-up or tissue sampling.   IMPRESSION: 1. Similar appearing multinodular goiter. 2. Unchanged appearance of solid, left mid thyroid nodule (labeled 4, 2.0 cm) which meets criteria (TI-RADS category 3) for 1 year ultrasound follow-up. This study marks 2 years  stability.  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  I have again encouraged her to start it  Medications  Continue octreotide 100 mcg (1 mL) SQ 3 times daily Start cabergoline 0.5 mg, 1 tab twice WEEKLY     2.  Central hypothyroidism  -Free T4 is normal -No changes   Medication Continue levothyroxine 88 mcg daily  3. 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:  [id number] % -Unfortunately she continues with hyperglycemia, this is due to improper use of insulin as well as dietary indiscretions, the patient does not drink sugar sweetened beverages but she does tend to eat cookies, we discussed the importance of low-carb diet - Metformin discontinued due to low GFR in June 2023 -Tradjenta was discontinued due to ineffectiveness and persistent hyperglycemia -I have again encouraged her to consider CGM technology but she declines, I did offer a receiver as she is concerned that her from would not be compatible but she again declines -She initially endorsed compliance with multiple daily injections of insulin, but upon further questioning, it appears that the patient did not take her insulin with breakfast today.  I again encouraged the patient to take NovoLog  right before each meal -Patient advised to check glucose before each meal -She will be provided with a correction scale    Medication  Continue Basaglar 40 units ONCE  daily Increase NovoLog 6 units 3 times daily before every meal Start correction factor : (BG -130/30) 3 times daily before every meal    4. Right foot cellulitis/Fissures:  -The patient has skin fissures at the skin fold of the feet anterior to the ankle , she has been noted with erythema and yellowish discharge on the right, will treat her with doxycycline for cellulitis -I have asked her to avoid wearing the compression stockings as I suspect this may have played in the fissures -I offered to refer her to podiatry, but the patient does see Dr. Barkley Bruns and I have advised her to contact him ASAP   Medication Doxycycline 100 mg twice daily x 10 days   5. MNG: -No local neck symptoms -I had ordered thyroid ultrasound in the summer 2023 and this was not done, new order has been placed today    follow-up in 4 months   Signed electronically by: Mack Guise, MD  [facility]  [facility] [address] [place], [place] [address] Phone: [phone] FAX: [phone]      CC: Joycelyn Man, [place] [address] Phone: [phone]  Fax: [phone]   Return to Endocrinology clinic as below: Future Appointments  Date Time Provider [place]  03/26/2023 10:45 AM Rankin, Clent Demark, MD RDE-RDE None

## 2023-01-16 ENCOUNTER — Ambulatory Visit (INDEPENDENT_AMBULATORY_CARE_PROVIDER_SITE_OTHER): Payer: Medicare HMO | Admitting: Internal Medicine

## 2023-01-16 ENCOUNTER — Encounter: Payer: Self-pay | Admitting: Internal Medicine

## 2023-01-16 VITALS — BP 140/70 | HR 84 | Ht 67.0 in | Wt 240.0 lb

## 2023-01-16 DIAGNOSIS — E039 Hypothyroidism, unspecified: Secondary | ICD-10-CM | POA: Diagnosis not present

## 2023-01-16 DIAGNOSIS — E220 Acromegaly and pituitary gigantism: Secondary | ICD-10-CM | POA: Diagnosis not present

## 2023-01-16 DIAGNOSIS — N1832 Chronic kidney disease, stage 3b: Secondary | ICD-10-CM | POA: Diagnosis not present

## 2023-01-16 DIAGNOSIS — D352 Benign neoplasm of pituitary gland: Secondary | ICD-10-CM | POA: Diagnosis not present

## 2023-01-16 DIAGNOSIS — R234 Changes in skin texture: Secondary | ICD-10-CM | POA: Diagnosis not present

## 2023-01-16 DIAGNOSIS — E1165 Type 2 diabetes mellitus with hyperglycemia: Secondary | ICD-10-CM | POA: Diagnosis not present

## 2023-01-16 DIAGNOSIS — L03115 Cellulitis of right lower limb: Secondary | ICD-10-CM

## 2023-01-16 DIAGNOSIS — E042 Nontoxic multinodular goiter: Secondary | ICD-10-CM

## 2023-01-16 DIAGNOSIS — E1122 Type 2 diabetes mellitus with diabetic chronic kidney disease: Secondary | ICD-10-CM

## 2023-01-16 DIAGNOSIS — Z794 Long term (current) use of insulin: Secondary | ICD-10-CM

## 2023-01-16 LAB — POCT GLUCOSE (DEVICE FOR HOME USE): POC Glucose: 168 mg/dl — AB (ref 70–99)

## 2023-01-16 LAB — POCT GLYCOSYLATED HEMOGLOBIN (HGB A1C): Hemoglobin A1C: 12.6 % — AB (ref 4.0–5.6)

## 2023-01-16 MED ORDER — PEN NEEDLES 31G X 5 MM MISC: 1.0000 | Freq: Four times a day (QID) | 3 refills | Status: DC

## 2023-01-16 MED ORDER — NOVOLOG FLEXPEN 100 UNIT/ML ~~LOC~~ SOPN
PEN_INJECTOR | SUBCUTANEOUS | 3 refills | Status: DC
Start: 2023-01-16 — End: 2023-08-08

## 2023-01-16 MED ORDER — LEVOTHYROXINE SODIUM 88 MCG PO TABS: 88.0000 ug | ORAL_TABLET | Freq: Every day | ORAL | 3 refills | Status: DC

## 2023-01-16 MED ORDER — CABERGOLINE 0.5 MG PO TABS: 0.5000 mg | ORAL_TABLET | ORAL | 3 refills | Status: DC

## 2023-01-16 MED ORDER — BASAGLAR KWIKPEN 100 UNIT/ML ~~LOC~~ SOPN: 40.0000 [IU] | PEN_INJECTOR | Freq: Every day | SUBCUTANEOUS | 3 refills | Status: DC

## 2023-01-16 MED ORDER — DOXYCYCLINE HYCLATE 100 MG PO TABS: 100.0000 mg | ORAL_TABLET | Freq: Two times a day (BID) | ORAL | 0 refills | Status: DC

## 2023-01-16 NOTE — Patient Instructions (Addendum)
Take Doxycycline twice a day ( antibiotic)  Start cabergoline 0.5 mg, 1 tablet TWICE WEEKLY ( Wednesday and Sunday) Increase  Novolog 6 units with Breakfast, Lunch and Supper Continue   Basaglar 40 units ONCE DAILY   Novolog correctional insulin: ADD extra units on insulin to your meal-time Novolog dose if your blood sugars are higher than 160. Use the scale below to help guide you:   Blood sugar before meal Number of units to inject  Less than 160 0 unit  161 -  190 1 units  191 -  220 2 units  221 -  250 3 units  251 -  280 4 units  281 -  310 5 units  311 -  340 6 units  341 -  370 7 units  371 -  400 8 units     HOW TO TREAT LOW BLOOD SUGARS (Blood sugar LESS THAN 70 MG/DL) Please follow the RULE OF 15 for the treatment of hypoglycemia treatment (when your (blood sugars are less than 70 mg/dL)   STEP 1: Take 15 grams of carbohydrates when your blood sugar is low, which includes:  3-4 GLUCOSE TABS  OR 3-4 OZ OF JUICE OR REGULAR SODA OR ONE TUBE OF GLUCOSE GEL    STEP 2: RECHECK blood sugar in 15 MINUTES STEP 3: If your blood sugar is still low at the 15 minute recheck --> then, go back to STEP 1 and treat AGAIN with another 15 grams of carbohydrates.

## 2023-01-22 ENCOUNTER — Encounter: Payer: Self-pay | Admitting: Internal Medicine

## 2023-01-22 ENCOUNTER — Ambulatory Visit
Admission: RE | Admit: 2023-01-22 | Discharge: 2023-01-22 | Disposition: A | Discharge status: Home / Self Care | Payer: Medicare HMO | Source: Ambulatory Visit | Attending: Internal Medicine | Admitting: Internal Medicine

## 2023-01-22 DIAGNOSIS — E042 Nontoxic multinodular goiter: Secondary | ICD-10-CM

## 2023-03-05 ENCOUNTER — Encounter: Payer: Self-pay | Admitting: Family Medicine

## 2023-03-05 ENCOUNTER — Ambulatory Visit (INDEPENDENT_AMBULATORY_CARE_PROVIDER_SITE_OTHER): Payer: Medicare HMO | Admitting: Family Medicine

## 2023-03-05 VITALS — BP 122/78 | HR 83 | Temp 97.8°F | Ht 66.0 in | Wt 226.4 lb

## 2023-03-05 DIAGNOSIS — Z9989 Dependence on other enabling machines and devices: Secondary | ICD-10-CM | POA: Diagnosis not present

## 2023-03-05 DIAGNOSIS — E1165 Type 2 diabetes mellitus with hyperglycemia: Secondary | ICD-10-CM

## 2023-03-05 DIAGNOSIS — Z794 Long term (current) use of insulin: Secondary | ICD-10-CM | POA: Diagnosis not present

## 2023-03-05 DIAGNOSIS — L97929 Non-pressure chronic ulcer of unspecified part of left lower leg with unspecified severity: Secondary | ICD-10-CM

## 2023-03-05 DIAGNOSIS — Z124 Encounter for screening for malignant neoplasm of cervix: Secondary | ICD-10-CM

## 2023-03-05 DIAGNOSIS — L97919 Non-pressure chronic ulcer of unspecified part of right lower leg with unspecified severity: Secondary | ICD-10-CM | POA: Diagnosis not present

## 2023-03-05 DIAGNOSIS — I872 Venous insufficiency (chronic) (peripheral): Secondary | ICD-10-CM | POA: Diagnosis not present

## 2023-03-05 DIAGNOSIS — I87313 Chronic venous hypertension (idiopathic) with ulcer of bilateral lower extremity: Secondary | ICD-10-CM

## 2023-03-05 DIAGNOSIS — E220 Acromegaly and pituitary gigantism: Secondary | ICD-10-CM

## 2023-03-05 MED ORDER — HYDROCORTISONE 2.5 % EX OINT: TOPICAL_OINTMENT | Freq: Two times a day (BID) | CUTANEOUS | 3 refills | Status: DC

## 2023-03-05 NOTE — Assessment & Plan Note (Signed)
Danielle Schroeder's diabetes is poorly controlled, evident by her high HbA1c level. It is essential to optimize her diabetes management to prevent complications.  Plan: Continue current insulin regimen as prescribed by the endocrinologist. Re-emphasize the importance of diet and exercise in diabetes management. Provide education on effective diabetes self-management. Referral to Chronic Care Management Services for additional support.

## 2023-03-05 NOTE — Assessment & Plan Note (Signed)
Danielle Schroeder has bilateral lower extremity ulcers likely due to chronic venous hypertension, with a possible complicating factor being diabetes mellitus.  Plan: Discontinue compression stockings until further evaluation. Referral to Vascular Surgery for a comprehensive vascular assessment. Referral to Wound Clinic for specialized care and possible debridement of the ulcers. Hydrocortisone 2.5 % ointment may be used on dry areas of the skin but not on open wounds. Encourage elevation of lower extremities to decrease edema. Consider use of moisturizing creams for dry skin and care of the wound periphery.

## 2023-03-05 NOTE — Progress Notes (Signed)
Assessment/Plan:   Problem List Items Addressed This Visit       Cardiovascular and Mediastinum   Chronic venous hypertension (idiopathic) with ulcer of bilateral lower extremity - Primary    Danielle Schroeder has bilateral lower extremity ulcers likely due to chronic venous hypertension, with a possible complicating factor being diabetes mellitus.  Plan: Discontinue compression stockings until further evaluation. Referral to Vascular Surgery for a comprehensive vascular assessment. Referral to Wound Clinic for specialized care and possible debridement of the ulcers. Hydrocortisone 2.5 % ointment may be used on dry areas of the skin but not on open wounds. Encourage elevation of lower extremities to decrease edema. Consider use of moisturizing creams for dry skin and care of the wound periphery.      Relevant Orders   Ambulatory referral to Vascular Surgery   Ambulatory referral to Wound Clinic     Endocrine   Acromegaly   Type 2 diabetes mellitus with hyperglycemia, with long-term current use of insulin    Danielle Schroeder's diabetes is poorly controlled, evident by her high HbA1c level. It is essential to optimize her diabetes management to prevent complications.  Plan: Continue current insulin regimen as prescribed by the endocrinologist. Re-emphasize the importance of diet and exercise in diabetes management. Provide education on effective diabetes self-management. Referral to Chronic Care Management Services for additional support.      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  Return in about 4 weeks (around 04/02/2023) for Wound check.    Subjective:   Encounter date: 03/05/2023  Danielle Schroeder is a 67 y.o. 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 determined by examination associated with type 2 diabetes mellitus; Proliferative diabetic retinopathy of left eye with macular edema associated with type 2 diabetes mellitus; Retinal hemorrhage of left eye; Retinal microaneurysm of both eyes; Retinal hemorrhage of right eye; Vitamin D deficiency; Vitreomacular adhesion, left; Type 2 diabetes mellitus with stage 3b chronic kidney disease, with long-term current use of insulin; Type 2 diabetes mellitus with hyperglycemia, with long-term current use of insulin; Pseudophakia, both eyes; Cataract; CKD (chronic kidney disease) stage 3, GFR 30-59 ml/min; Diabetic neuropathy; Diabetic retinopathy associated with type 2 diabetes mellitus; Essential (primary) hypertension; Hyperlipidemia; Obesity; Pituitary mass; Tubular adenoma of colon; 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   She  has a past medical history of Cataract, CHF (congestive heart failure), Diabetes mellitus without complication, Hyperlipidemia, Hypertension, Pituitary adenoma with extrasellar extension, Sleep apnea, and Thyroid disease.Marland Kitchen   CHIEF COMPLAINT: [name] is establishing care and wishes to discuss her diabetes management and issues with edema and wounds on her lower extremities.   HISTORY OF PRESENT ILLNESS:  Danielle Schroeder is originally from [place] and currently resides in [place] since 2016. She has a family, including children and grandchildren, who live in the [place] area.  Diabetes: Danielle Schroeder's diabetes control is a major concern with her recent  HbA1c being considerably high at 12.6%. She is currently on insulin and has seen an endocrinologist for her condition. No changes to her diabetes management have been made today as she is under the care of a [facility], Dr. Lonzo Cloud.  Acromegaly: Patient reports a history of acromegaly for which he sees [facility].  She is currently taking octreotide.  She reports that this is led to significant enlargement and swelling in both hands and feet.  Edema and Leg Wounds: She reports edema and has chronic ulcers on both lower extremities which have not adequately healed over the past 3 to 4 months.  Previously treatments include compression stockings, antibiotics, and following with the wound clinic.  She reports that she was previously informed to stop wearing her compression stockings as the compression was too tight and led to the development of her ulcers.    Review of Systems  Constitutional:  Negative for chills, diaphoresis, fever, malaise/fatigue and weight loss.  HENT:  Negative for congestion, ear discharge, ear pain and hearing loss.   Eyes:  Negative for blurred vision, double vision, photophobia, pain, discharge and redness.  Respiratory:  Negative for cough, sputum production, shortness of breath and wheezing.   Cardiovascular:  Positive for leg swelling (Chronic). Negative for chest pain and palpitations.  Gastrointestinal:  Negative for abdominal pain, blood in stool, constipation, diarrhea, heartburn, melena, nausea and vomiting.  Genitourinary:  Negative for dysuria, flank pain, frequency, hematuria and urgency.  Musculoskeletal:  Negative for myalgias.  Skin:  Positive for rash (Chronic dry skin and wounds and legs). Negative for itching.  Neurological:  Negative for dizziness, tingling, tremors, speech change, seizures, loss of consciousness, weakness and headaches.  Endo/Heme/Allergies:  Negative for polydipsia.  Psychiatric/Behavioral:  Negative for  depression, hallucinations, memory loss, substance abuse and suicidal ideas. The patient does not have insomnia.     Past Surgical History:  Procedure Laterality Date   BREAST SURGERY     CHOLECYSTECTOMY     TONSILLECTOMY     TUBAL LIGATION      Outpatient Medications Prior to Visit  Medication Sig Dispense Refill   aspirin EC 81 MG tablet Take 81 mg by mouth daily.     carvedilol (COREG) 25 MG tablet Take 25 mg by mouth 2 (two) times daily with a meal.     furosemide (LASIX) 40 MG tablet TAKE 1 TABLET BY MOUTH TWICE A DAY 180 tablet 3   glucose blood test strip 1 each by Other route 3 (three) times daily. Use as instructed     insulin aspart (NOVOLOG FLEXPEN) 100 UNIT/ML FlexPen Max daily 45 units 45 mL 3   Insulin Glargine (BASAGLAR KWIKPEN) 100 UNIT/ML Inject 40 Units into the skin daily. 45 mL 3   Insulin Pen Needle (PEN NEEDLES) 31G X 5 MM MISC 1 each by Does not apply route in the morning, at noon, in the evening, and at bedtime. 400 each 3   levothyroxine (SYNTHROID) 88 MCG tablet Take 1 tablet (88 mcg total) by mouth daily. 90 tablet 3   losartan (COZAAR) 100 MG tablet Take 100 mg by mouth daily.     NIFEdipine (PROCARDIA-XL/NIFEDICAL-XL) 30 MG 24 hr tablet Take 30 mg by mouth daily.     Octreotide Acetate 100 MCG/ML SOSY Inject 1 mL (100 mcg total) into the skin 3 (three) times daily. 100 mL 11   potassium chloride (KLOR-CON) 10 MEQ tablet TAKE 1 TABLET  BY MOUTH EVERY DAY. Please contact our office to schedule an overdue appointment for future refills. [phone]. Thank you ( 1st attempt) 30 tablet 0   simvastatin (ZOCOR) 20 MG tablet Take 20 mg by mouth daily.     SYRINGE-NEEDLE, DISP, 3 ML 25G X 1" 3 ML MISC Inject 1 ml into the skin 3 times daily. 100 each 11   Vitamin D, Cholecalciferol, 25 MCG (1000 UT) CAPS Take 2 capsules by mouth daily. 60 capsule 0   cabergoline (DOSTINEX) 0.5 MG tablet Take 1 tablet (0.5 mg total) by mouth 2 (two) times a week. 24 tablet 3    doxycycline (VIBRA-TABS) 100 MG tablet Take 1 tablet (100 mg total) by mouth 2 (two) times daily. (Patient not taking: Reported on 03/05/2023) 20 tablet 0   silver sulfADIAZINE (SILVADENE) 1 % cream Apply topically 2 (two) times daily. (Patient not taking: Reported on 03/05/2023)     No facility-administered medications prior to visit.    Family History  Problem Relation Age of Onset   Thyroid disease Neg Hx    Colon cancer Neg Hx    Esophageal cancer Neg Hx    Rectal cancer Neg Hx    Stomach cancer Neg Hx     Social History   Socioeconomic History   Marital status: Single    Spouse name: Not on file   Number of children: Not on file   Years of education: Not on file   Highest education level: Not on file  Occupational History   Not on file  Tobacco Use   Smoking status: Never   Smokeless tobacco: Never  Vaping Use   Vaping Use: Never used  Substance and Sexual Activity   Alcohol use: Never   Drug use: Never   Sexual activity: Not Currently  Other Topics Concern   Not on file  Social History Narrative   Not on file   Social Determinants of Health   Financial Resource Strain: Not on file  Food Insecurity: Not on file  Transportation Needs: Not on file  Physical Activity: Not on file  Stress: Not on file  Social Connections: Not on file  Intimate Partner Violence: Not on file                                                                                                  Objective:  Physical Exam: BP 122/78 (BP Location: Left Arm, Patient Position: Sitting, Cuff Size: Large)   Pulse 83   Temp 97.8 F (36.6 C) (Temporal)   Ht 5\' 6"  (1.676 m)   Wt 226 lb 6.4 oz (102.7 kg)   SpO2 96%   BMI 36.54 kg/m     Physical Exam Constitutional:      General: She is not in acute distress.    Appearance: Normal appearance. She is not ill-appearing or toxic-appearing.  HENT:     Head: Normocephalic and atraumatic.     Nose: Nose normal. No congestion.  Eyes:      General: No scleral icterus.    Extraocular Movements: Extraocular movements intact.  Cardiovascular:  Rate and Rhythm: Normal rate and regular rhythm.     Pulses: Normal pulses.     Heart sounds: Normal heart sounds.  Pulmonary:     Effort: Pulmonary effort is normal. No respiratory distress.     Breath sounds: Normal breath sounds.  Abdominal:     General: Abdomen is flat. Bowel sounds are normal.     Palpations: Abdomen is soft.  Musculoskeletal:        General: Normal range of motion.     Right lower leg: 3+ Pitting Edema present.     Left lower leg: 3+ Pitting Edema present.  Lymphadenopathy:     Cervical: No cervical adenopathy.  Skin:    General: Skin is warm and dry.     Findings: Erythema, rash and wound (Bilateral ulcers at dorsal aspect ankles, without purulence or drainage) present. Rash is scaling.     Comments: Thickening yellow nails bilaterally, consistent with diabetic foot disease  Neurological:     General: No focal deficit present.     Mental Status: She is alert and oriented to person, place, and time. Mental status is at baseline.     Gait: Gait abnormal (Ambulates with cane).  Psychiatric:        Mood and Affect: Mood normal.        Behavior: Behavior normal.        Thought Content: Thought content normal.        Judgment: Judgment normal.         US THYROID  Result Date: 01/22/2023 CLINICAL DATA:  Prior ultrasound follow-up. EXAM: THYROID ULTRASOUND TECHNIQUE: Ultrasound examination of the thyroid gland and adjacent soft tissues was performed. COMPARISON:  04/22/2021, 10/20/2019 FINDINGS: Parenchymal Echotexture: Mildly heterogeneous Isthmus: 0.5 cm ,previously 0.8 cm Right lobe: 5.7 x 2.6 x 2.5 cm ,previously 5.2 x 1.9 x 2.0 cm Left lobe: 4.3 x 2.7 x 2.0 cm ,previously 4.7 x 2.5 x 1.9 cm ________________________________________________________ Estimated total number of nodules >/= 1 cm: 3 Number of spongiform nodules >/=  2 cm not described below  (TR1): 0 Number of mixed cystic and solid nodules >/= 1.5 cm not described below (TR2): 0 _________________________________________________________ Nodule # 4: Prior biopsy: No Location: Left; Mid Maximum size: 1.9 cm; Other 2 dimensions: 1.5 x 0.9 cm, previously, 2.0 x 1.4 x 0.9 cm Composition: solid/almost completely solid (2) Echogenicity: isoechoic (1) Shape: not taller-than-wide (0) Margins: ill-defined (0) Echogenic foci: none (0) ACR TI-RADS total points: 3. ACR TI-RADS risk category:  TR3 (3 points). Significant change in size (>/= 20% in two dimensions and minimal increase of 2 mm): Change in features: No Change in ACR TI-RADS risk category: No ACR TI-RADS recommendations: *Given size (>/= 1.5 - 2.4 cm) and appearance, a follow-up ultrasound in 1 year should be considered based on TI-RADS criteria. _________________________________________________________ Unchanged appearance of the previously visualized multifocal subcentimeter solid and right inferior solid cystic thyroid nodule which again appear benign not warrant additional ultrasound follow-up or tissue sampling. No cervical lymphadenopathy. IMPRESSION: 1. Similar appearing multinodular goiter. 2. Unchanged appearance of solid, left mid thyroid nodule (labeled 4, 1.9 cm, previously 2.0 cm) which again meets criteria (TI-RADS category 3) for 1 year ultrasound follow-up. This study marks 4 years stability. The above is in keeping with the ACR TI-RADS recommendations - J Am Coll Radiol 2017;14:587-595. Marliss Coots, MD [facility] Radiology Electronically Signed   By: Marliss Coots M.D.   On: 01/22/2023 11:27    Recent Results (from  the past 2160 hour(s))  POCT Glucose (Device for Home Use)     Status: Abnormal   Collection Time: 01/16/23 10:03 AM  Result Value Ref Range   Glucose Fasting, POC     POC Glucose 168 (A) 70 - 99 mg/dl  POCT glycosylated hemoglobin (Hb A1C)     Status: Abnormal    Collection Time: 01/16/23 10:06 AM  Result Value Ref Range   Hemoglobin A1C 12.6 (A) 4.0 - 5.6 %   HbA1c POC (<> result, manual entry)     HbA1c, POC (prediabetic range)     HbA1c, POC (controlled diabetic range)          Garner Nash, MD, MS

## 2023-03-05 NOTE — Patient Instructions (Signed)
For lower leg wounds, we are referring to the vascular surgeon and wound clinic.  Please also discussed with your podiatrist. For dry skin and legs, you may apply hydrocortisone and continue to use lotion.  Please do not apply to the wounds, as this can cause poor healing. We are also referring you to our care management team that will help with diabetes.

## 2023-03-09 ENCOUNTER — Telehealth: Payer: Self-pay

## 2023-03-09 NOTE — Progress Notes (Signed)
  Chronic Care Management   Note  03/09/2023 Name: SHARNAY CASHION MRN: [id number] DOB: 05/23/1956  Glenice Laine Pauwels is a 67 y.o. year old female who is a primary care patient of [name], MD. I reached out to [employer] by phone today in response to a referral sent by Ms. Keyari G Sandy's PCP.  The first contact attempt was unsuccessful.   Follow up plan: Additional outreach attempts will be made.  [name], RMA Care Guide [facility]  [place], [place] [address] Direct Dial: [phone] [email or web address]

## 2023-03-14 NOTE — Progress Notes (Signed)
  Chronic Care Management   Note  03/14/2023 Name: ZORAYA FIORENZA MRN: [id number] DOB: 1956/07/19  Glenice Laine Virts is a 67 y.o. year old female who is a primary care patient of [name], MD. I reached out to [employer] by phone today in response to a referral sent by Ms. Jeneane G Hanton's PCP.  The second contact attempt was unsuccessful.   Follow up plan: Additional outreach attempts will be made.  [name], RMA Care Guide [facility]  [place], [place] [address] Direct Dial: [phone] [email or web address]

## 2023-03-15 ENCOUNTER — Other Ambulatory Visit: Payer: Self-pay | Admitting: *Deleted

## 2023-03-15 DIAGNOSIS — I872 Venous insufficiency (chronic) (peripheral): Secondary | ICD-10-CM

## 2023-03-19 NOTE — Progress Notes (Signed)
  Chronic Care Management   Note  03/19/2023 Name: Danielle Schroeder MRN: [id number] DOB: [birth year]  [name] is a 66 y.o. year old female who is a primary care patient of [name], MD. I reached out to [employer] by phone today in response to a referral sent by [name]'s PCP.  The third contact attempt was unsuccessful.   Follow up plan: Unable to reach patient after 3 attempts. No further outreach attempts will be made pending additional provider engagement, patient request, or new provider order.   [name], RMA Care Guide [facility]  [place], [place] [address] Direct Dial: [phone] [email or web address]

## 2023-03-20 DIAGNOSIS — L97312 Non-pressure chronic ulcer of right ankle with fat layer exposed: Secondary | ICD-10-CM | POA: Diagnosis not present

## 2023-03-20 DIAGNOSIS — L97322 Non-pressure chronic ulcer of left ankle with fat layer exposed: Secondary | ICD-10-CM | POA: Diagnosis not present

## 2023-03-22 ENCOUNTER — Telehealth: Payer: Self-pay

## 2023-03-22 NOTE — Telephone Encounter (Signed)
A user error has taken place: encounter opened in error, closed for administrative reasons.

## 2023-03-23 ENCOUNTER — Ambulatory Visit (HOSPITAL_COMMUNITY)
Admission: RE | Admit: 2023-03-23 | Discharge: 2023-03-23 | Disposition: A | Discharge status: Home / Self Care | Payer: Medicare HMO | Source: Ambulatory Visit | Attending: Vascular Surgery | Admitting: Vascular Surgery

## 2023-03-23 ENCOUNTER — Encounter: Payer: Self-pay | Admitting: Vascular Surgery

## 2023-03-23 ENCOUNTER — Ambulatory Visit: Payer: Medicare HMO | Admitting: Vascular Surgery

## 2023-03-23 VITALS — BP 162/101 | HR 86 | Temp 98.4°F | Resp 20 | Ht 66.0 in | Wt 225.0 lb

## 2023-03-23 DIAGNOSIS — I872 Venous insufficiency (chronic) (peripheral): Secondary | ICD-10-CM

## 2023-03-23 DIAGNOSIS — I70644 Atherosclerosis of nonbiological bypass graft(s) of the left leg with ulceration of heel and midfoot: Secondary | ICD-10-CM | POA: Diagnosis not present

## 2023-03-23 NOTE — Progress Notes (Signed)
Chief Financial Officer Retired.  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    Office Note     CC: Bilateral lower extremity wounds Requesting Provider:  [name], MD  HPI: Danielle Schroeder is a 67 y.o. ([birth year]) female presenting at the request of .[name], MD with bilateral lower extremity wounds.  On exam today, Danielle Schroeder was doing well.  A native of [facility], she retired from [employer] after 22 years, and now resides in [place].  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  She has known peripheral arterial disease with prior ABI demonstrating 0.9 right, 0.59 left.  Danielle Schroeder has struggled with bilateral lower extremity swelling for years.  She has worn compression stockings, which have proven beneficial.  Recently however, the compression stockings have created wounds due to compression at areas of folding at the level of the ankle.  She also has a left-sided heel wound, which has been present for over a month.  Danielle Schroeder denies claudication, ischemic rest pain.  She noted the tissue loss on bilateral feet appears to be improving, however has been present for a number of months.  Past Medical History:  Diagnosis Date   Cataract    bilateral lens implants   CHF (congestive heart failure) (HCC)    Diabetes mellitus without complication (HCC)    Hyperlipidemia    Hypertension    Pituitary adenoma with extrasellar extension (HCC)    Sleep apnea    no c-pap   Thyroid disease    hypothyroidism    Past Surgical History:  Procedure Laterality Date   BREAST SURGERY     CHOLECYSTECTOMY     TONSILLECTOMY     TUBAL LIGATION      Social History   Socioeconomic History   Marital status: Single    Spouse name: Not on file   Number of children: Not on file   Years of education: Not on  file   Highest education level: Not on file  Occupational History   Not on file  Tobacco Use   Smoking status: Never   Smokeless tobacco: Never  Vaping Use   Vaping Use: Never used  Substance and Sexual Activity   Alcohol use: Never   Drug use: Never   Sexual activity: Not Currently  Other Topics Concern   Not on file  Social History Narrative   Not on file   Social Determinants of Health   Financial Resource Strain: Not on file  Food Insecurity: Not on file  Transportation Needs: Not on file  Physical Activity: Not on file  Stress: Not on file  Social Connections: Not on file  Intimate Partner Violence: Not on file   Family History  Problem Relation Age of Onset   Thyroid disease Neg Hx    Colon cancer Neg Hx    Esophageal cancer Neg Hx    Rectal cancer Neg Hx    Stomach cancer Neg Hx     Current Outpatient Medications  Medication Sig Dispense Refill   aspirin EC 81 MG tablet Take 81 mg by mouth daily.     cabergoline (DOSTINEX) 0.5 MG tablet Take 1 tablet (0.5 mg total) by mouth 2 (two) times a week. 24 tablet 3   carvedilol (COREG) 25 MG tablet Take 25 mg by mouth 2 (two) times daily with a meal.  furosemide (LASIX) 40 MG tablet TAKE 1 TABLET BY MOUTH TWICE A DAY 180 tablet 3   glucose blood test strip 1 each by Other route 3 (three) times daily. Use as instructed     hydrocortisone 2.5 % ointment Apply topically 2 (two) times daily. As needed for mild eczema.  Do not use for more than 1-2 weeks at a time. 30 g 3   insulin aspart (NOVOLOG FLEXPEN) 100 UNIT/ML FlexPen Max daily 45 units 45 mL 3   Insulin Glargine (BASAGLAR KWIKPEN) 100 UNIT/ML Inject 40 Units into the skin daily. 45 mL 3   Insulin Pen Needle (PEN NEEDLES) 31G X 5 MM MISC 1 each by Does not apply route in the morning, at noon, in the evening, and at bedtime. 400 each 3   levothyroxine (SYNTHROID) 88 MCG tablet Take 1 tablet (88 mcg total) by mouth daily. 90 tablet 3   losartan (COZAAR) 100 MG  tablet Take 100 mg by mouth daily.     NIFEdipine (PROCARDIA-XL/NIFEDICAL-XL) 30 MG 24 hr tablet Take 30 mg by mouth daily.     Octreotide Acetate 100 MCG/ML SOSY Inject 1 mL (100 mcg total) into the skin 3 (three) times daily. 100 mL 11   potassium chloride (KLOR-CON) 10 MEQ tablet TAKE 1 TABLET BY MOUTH EVERY DAY. Please contact our office to schedule an overdue appointment for future refills. [phone]. Thank you ( 1st attempt) 30 tablet 0   silver sulfADIAZINE (SILVADENE) 1 % cream Apply topically 2 (two) times daily.     simvastatin (ZOCOR) 20 MG tablet Take 20 mg by mouth daily.     SYRINGE-NEEDLE, DISP, 3 ML 25G X 1" 3 ML MISC Inject 1 ml into the skin 3 times daily. 100 each 11   Vitamin D, Cholecalciferol, 25 MCG (1000 UT) CAPS Take 2 capsules by mouth daily. 60 capsule 0   No current facility-administered medications for this visit.    No Known Allergies   REVIEW OF SYSTEMS:  [X]  denotes positive finding, [ ]  denotes negative finding Cardiac  Comments:  Chest pain or chest pressure:    Shortness of breath upon exertion:    Short of breath when lying flat:    Irregular heart rhythm:        Vascular    Pain in calf, thigh, or hip brought on by ambulation:    Pain in feet at night that wakes you up from your sleep:     Blood clot in your veins:    Leg swelling:         Pulmonary    Oxygen at home:    Productive cough:     Wheezing:         Neurologic    Sudden weakness in arms or legs:     Sudden numbness in arms or legs:     Sudden onset of difficulty speaking or slurred speech:    Temporary loss of vision in one eye:     Problems with dizziness:         Gastrointestinal    Blood in stool:     Vomited blood:         Genitourinary    Burning when urinating:     Blood in urine:        Psychiatric    Major depression:         Hematologic    Bleeding problems:    Problems with blood clotting too easily:  Skin    Rashes or ulcers:         Constitutional    Fever or chills:      PHYSICAL EXAMINATION:  Vitals:   03/23/23 1335  BP: (!) 162/101  Pulse: 86  Resp: 20  Temp: 98.4 F (36.9 C)  SpO2: 97%  Weight: 225 lb (102.1 kg)  Height: 5\' 6"  (1.676 m)    General:  WDWN in NAD; vital signs documented above Gait: Not observed HENT: WNL, normocephalic Pulmonary: normal non-labored breathing , without wheezing Cardiac: regular HR Abdomen: soft, NT, no masses Skin: without rashes Vascular Exam/Pulses:  Right Left  Radial 2+ (normal) 2+ (normal)  Ulnar    Femoral    Popliteal    DP 2+ (normal) absent  PT     Extremities: withoutischemic changes, without Gangrene , without cellulitis; with open wounds;  Musculoskeletal: no muscle wasting or atrophy  Neurologic: A&O X 3;  No focal weakness or paresthesias are detected Psychiatric:  The pt has Normal affect.   Non-Invasive Vascular Imaging:   ABIs reviewed from 07/20/2022 demonstrating mild peripheral arterial disease on the right, moderate on the left. Right lower extremity venous reflux study demonstrates reflux throughout the greater saphenous vein.    ASSESSMENT/PLAN: CESAR MUSACCHIA is a 67 y.o. female presenting with mixed arterial venous disease with wounds on bilateral feet.  Her wounds appear to be from wearing compression stockings that are too tight, and leaving them on for too long.  The areas of skin breakdown is localized to where there can be folding that occurs at the ankle.  More concerning is the left lower extremity heel wound.  Physical exam findings and studies illustrate critical limb ischemia with tissue loss in the left foot, as well as chronic venous insufficiency (CEAP 3) throughout the greater saphenous vein in the right leg.  The left leg was not evaluated.  I had a long discussion with Idabelle regarding the above.  Being the wounds been present for a number of months, I think she would benefit from bilateral lower extremity angiogram,  with emphasis on the left in an effort to define and possibly improve distal perfusion for wound healing.  We discussed that the diagnosis of mixed arterial venous disease is tough as arterial perfusion is most important, however depending on the level of disease can limit the amount of compression patient is can wear.  After discussing risk and benefits of bilateral lower extremity angiogram with emphasis on the left in effort to define and improve distal perfusion for wound healing, Dorothy elected to proceed.   Victorino Sparrow, MD [facility] [phone]

## 2023-03-26 ENCOUNTER — Encounter (INDEPENDENT_AMBULATORY_CARE_PROVIDER_SITE_OTHER): Payer: Medicare HMO | Admitting: Ophthalmology

## 2023-03-26 ENCOUNTER — Telehealth: Payer: Self-pay

## 2023-03-26 DIAGNOSIS — E113592 Type 2 diabetes mellitus with proliferative diabetic retinopathy without macular edema, left eye: Secondary | ICD-10-CM | POA: Diagnosis not present

## 2023-03-26 DIAGNOSIS — H43822 Vitreomacular adhesion, left eye: Secondary | ICD-10-CM | POA: Diagnosis not present

## 2023-03-26 DIAGNOSIS — E113551 Type 2 diabetes mellitus with stable proliferative diabetic retinopathy, right eye: Secondary | ICD-10-CM | POA: Diagnosis not present

## 2023-03-26 NOTE — Telephone Encounter (Signed)
Attempted to reach patient to schedule angiogram, no answer. Unable to leave a message, VM full.

## 2023-03-27 ENCOUNTER — Telehealth: Payer: Self-pay

## 2023-03-27 NOTE — Telephone Encounter (Signed)
Attempted to reach pt to schedule her procedure. Her VM is full.

## 2023-04-02 ENCOUNTER — Ambulatory Visit: Payer: Medicare HMO | Admitting: Family Medicine

## 2023-04-03 ENCOUNTER — Other Ambulatory Visit: Payer: Self-pay

## 2023-04-03 DIAGNOSIS — E113592 Type 2 diabetes mellitus with proliferative diabetic retinopathy without macular edema, left eye: Secondary | ICD-10-CM | POA: Diagnosis not present

## 2023-04-03 DIAGNOSIS — E113551 Type 2 diabetes mellitus with stable proliferative diabetic retinopathy, right eye: Secondary | ICD-10-CM | POA: Diagnosis not present

## 2023-04-03 DIAGNOSIS — I70644 Atherosclerosis of nonbiological bypass graft(s) of the left leg with ulceration of heel and midfoot: Secondary | ICD-10-CM

## 2023-04-11 ENCOUNTER — Ambulatory Visit (HOSPITAL_COMMUNITY)
Admission: RE | Admit: 2023-04-11 | Discharge: 2023-04-11 | Disposition: A | Discharge status: Home / Self Care | Payer: Medicare HMO | Attending: Vascular Surgery | Admitting: Vascular Surgery

## 2023-04-11 ENCOUNTER — Other Ambulatory Visit: Payer: Self-pay

## 2023-04-11 ENCOUNTER — Encounter (HOSPITAL_COMMUNITY): Payer: Self-pay | Admitting: Vascular Surgery

## 2023-04-11 ENCOUNTER — Other Ambulatory Visit: Payer: Self-pay | Admitting: Cardiology

## 2023-04-11 ENCOUNTER — Encounter (HOSPITAL_COMMUNITY)
Admission: RE | Disposition: A | Discharge status: Home / Self Care | Payer: Self-pay | Source: Home / Self Care | Attending: Vascular Surgery

## 2023-04-11 DIAGNOSIS — I509 Heart failure, unspecified: Secondary | ICD-10-CM | POA: Insufficient documentation

## 2023-04-11 DIAGNOSIS — L97429 Non-pressure chronic ulcer of left heel and midfoot with unspecified severity: Secondary | ICD-10-CM | POA: Insufficient documentation

## 2023-04-11 DIAGNOSIS — E11621 Type 2 diabetes mellitus with foot ulcer: Secondary | ICD-10-CM | POA: Diagnosis not present

## 2023-04-11 DIAGNOSIS — I70644 Atherosclerosis of nonbiological bypass graft(s) of the left leg with ulceration of heel and midfoot: Secondary | ICD-10-CM

## 2023-04-11 DIAGNOSIS — E1151 Type 2 diabetes mellitus with diabetic peripheral angiopathy without gangrene: Secondary | ICD-10-CM | POA: Insufficient documentation

## 2023-04-11 DIAGNOSIS — I110 Hypertensive heart disease with heart failure: Secondary | ICD-10-CM | POA: Insufficient documentation

## 2023-04-11 DIAGNOSIS — I70244 Atherosclerosis of native arteries of left leg with ulceration of heel and midfoot: Secondary | ICD-10-CM | POA: Diagnosis not present

## 2023-04-11 HISTORY — PX: ABDOMINAL AORTOGRAM W/LOWER EXTREMITY: CATH118223

## 2023-04-11 HISTORY — PX: PERIPHERAL VASCULAR BALLOON ANGIOPLASTY: CATH118281

## 2023-04-11 HISTORY — PX: PERIPHERAL VASCULAR INTERVENTION: CATH118257

## 2023-04-11 LAB — POCT I-STAT, CHEM 8
BUN: 13 mg/dL (ref 8–23)
Calcium, Ion: 1.1 mmol/L — ABNORMAL LOW (ref 1.15–1.40)
Chloride: 103 mmol/L (ref 98–111)
Creatinine, Ser: 1.5 mg/dL — ABNORMAL HIGH (ref 0.44–1.00)
Glucose, Bld: 225 mg/dL — ABNORMAL HIGH (ref 70–99)
HCT: 34 % — ABNORMAL LOW (ref 36.0–46.0)
Hemoglobin: 11.6 g/dL — ABNORMAL LOW (ref 12.0–15.0)
Potassium: 3.6 mmol/L (ref 3.5–5.1)
Sodium: 138 mmol/L (ref 135–145)
TCO2: 22 mmol/L (ref 22–32)

## 2023-04-11 LAB — POCT ACTIVATED CLOTTING TIME: Activated Clotting Time: 217 seconds

## 2023-04-11 LAB — GLUCOSE, CAPILLARY: Glucose-Capillary: 216 mg/dL — ABNORMAL HIGH (ref 70–99)

## 2023-04-11 SURGERY — ABDOMINAL AORTOGRAM W/LOWER EXTREMITY
Anesthesia: LOCAL | Laterality: Left

## 2023-04-11 MED ORDER — LIDOCAINE HCL (PF) 1 % IJ SOLN
INTRAMUSCULAR | Status: DC | PRN
  Administered 2023-04-11: 15 mL

## 2023-04-11 MED ORDER — CLOPIDOGREL BISULFATE 75 MG PO TABS: 75.0000 mg | ORAL_TABLET | Freq: Every day | ORAL | 11 refills | Status: DC

## 2023-04-11 MED ORDER — HYDRALAZINE HCL 20 MG/ML IJ SOLN: 5.0000 mg | INTRAMUSCULAR | Status: DC | PRN

## 2023-04-11 MED ORDER — SODIUM CHLORIDE 0.9 % IV SOLN: 250.0000 mL | INTRAVENOUS | Status: DC | PRN

## 2023-04-11 MED ORDER — MIDAZOLAM HCL 2 MG/2ML IJ SOLN
INTRAMUSCULAR | Status: DC | PRN
  Administered 2023-04-11: 1 mg via INTRAVENOUS

## 2023-04-11 MED ORDER — SODIUM CHLORIDE 0.9% FLUSH: 3.0000 mL | Freq: Two times a day (BID) | INTRAVENOUS | Status: DC

## 2023-04-11 MED ORDER — HEPARIN SODIUM (PORCINE) 1000 UNIT/ML IJ SOLN
INTRAMUSCULAR | Status: DC | PRN
  Administered 2023-04-11: 8000 [IU] via INTRAVENOUS
  Administered 2023-04-11: 2000 [IU] via INTRAVENOUS

## 2023-04-11 MED ORDER — ASPIRIN 81 MG PO CHEW
CHEWABLE_TABLET | ORAL | Status: AC
  Filled 2023-04-11: qty 1

## 2023-04-11 MED ORDER — LABETALOL HCL 5 MG/ML IV SOLN: 10.0000 mg | INTRAVENOUS | Status: DC | PRN

## 2023-04-11 MED ORDER — FENTANYL CITRATE (PF) 100 MCG/2ML IJ SOLN
INTRAMUSCULAR | Status: AC
  Filled 2023-04-11: qty 2

## 2023-04-11 MED ORDER — HEPARIN SODIUM (PORCINE) 1000 UNIT/ML IJ SOLN
INTRAMUSCULAR | Status: AC
  Filled 2023-04-11: qty 10

## 2023-04-11 MED ORDER — SODIUM CHLORIDE 0.9 % IV SOLN: INTRAVENOUS | Status: DC

## 2023-04-11 MED ORDER — LIDOCAINE HCL (PF) 1 % IJ SOLN
INTRAMUSCULAR | Status: AC
  Filled 2023-04-11: qty 30

## 2023-04-11 MED ORDER — ACETAMINOPHEN 325 MG PO TABS: 650.0000 mg | ORAL_TABLET | ORAL | Status: DC | PRN

## 2023-04-11 MED ORDER — CLOPIDOGREL BISULFATE 300 MG PO TABS
ORAL_TABLET | ORAL | Status: AC
  Filled 2023-04-11: qty 1

## 2023-04-11 MED ORDER — MIDAZOLAM HCL 2 MG/2ML IJ SOLN
INTRAMUSCULAR | Status: AC
  Filled 2023-04-11: qty 2

## 2023-04-11 MED ORDER — SODIUM CHLORIDE 0.9% FLUSH: 3.0000 mL | INTRAVENOUS | Status: DC | PRN

## 2023-04-11 MED ORDER — SODIUM CHLORIDE 0.9 % WEIGHT BASED INFUSION: 1.0000 mL/kg/h | INTRAVENOUS | Status: DC

## 2023-04-11 MED ORDER — CLOPIDOGREL BISULFATE 300 MG PO TABS
ORAL_TABLET | ORAL | Status: DC | PRN
  Administered 2023-04-11: 300 mg via ORAL

## 2023-04-11 MED ORDER — HEPARIN (PORCINE) IN NACL 1000-0.9 UT/500ML-% IV SOLN
INTRAVENOUS | Status: DC | PRN
  Administered 2023-04-11 (×2): 500 mL

## 2023-04-11 MED ORDER — ONDANSETRON HCL 4 MG/2ML IJ SOLN: 4.0000 mg | Freq: Four times a day (QID) | INTRAMUSCULAR | Status: DC | PRN

## 2023-04-11 MED ORDER — IODIXANOL 320 MG/ML IV SOLN
INTRAVENOUS | Status: DC | PRN
  Administered 2023-04-11: 120 mL via INTRA_ARTERIAL

## 2023-04-11 MED ORDER — ASPIRIN 81 MG PO CHEW
CHEWABLE_TABLET | ORAL | Status: DC | PRN
  Administered 2023-04-11: 81 mg via ORAL

## 2023-04-11 MED ORDER — FENTANYL CITRATE (PF) 100 MCG/2ML IJ SOLN
INTRAMUSCULAR | Status: DC | PRN
  Administered 2023-04-11: 50 ug via INTRAVENOUS

## 2023-04-11 MED ORDER — CLOPIDOGREL BISULFATE 75 MG PO TABS: 75.0000 mg | ORAL_TABLET | Freq: Every day | ORAL | Status: DC

## 2023-04-11 SURGICAL SUPPLY — 28 items
BALLN COYOTE OTW 2.5X220X150 (BALLOONS) ×1
BALLN STERLING OTW 4X60X135 (BALLOONS) ×1
BALLOON COYOTE OTW 2.5X220X150 (BALLOONS) IMPLANT
BALLOON STERLING OTW 4X60X135 (BALLOONS) IMPLANT
CATH OMNI FLUSH 5F 65CM (CATHETERS) IMPLANT
CATH QUICKCROSS .018X135CM (MICROCATHETER) IMPLANT
CATH QUICKCROSS .035X135CM (MICROCATHETER) IMPLANT
CATH RUBICON 018 135 (CATHETERS) IMPLANT
DCB RANGER 5.0X80 135 (BALLOONS) IMPLANT
DEVICE CLOSURE MYNXGRIP 6/7F (Vascular Products) IMPLANT
GLIDEWIRE ADV .035X260CM (WIRE) IMPLANT
KIT ENCORE 26 ADVANTAGE (KITS) IMPLANT
KIT MICROPUNCTURE NIT STIFF (SHEATH) IMPLANT
KIT PV (KITS) ×1 IMPLANT
RANGER DCB 5.0X80 135 (BALLOONS) ×1
SHEATH CATAPULT 6FR 60 (SHEATH) IMPLANT
SHEATH PINNACLE 5F 10CM (SHEATH) IMPLANT
SHEATH PINNACLE 6F 10CM (SHEATH) IMPLANT
SHEATH PROBE COVER 6X72 (BAG) IMPLANT
STENT INNOVA 6X80X130 (Permanent Stent) IMPLANT
STOPCOCK MORSE 400PSI 3WAY (MISCELLANEOUS) IMPLANT
SYR MEDRAD MARK 7 150ML (SYRINGE) ×1 IMPLANT
TRANSDUCER W/STOPCOCK (MISCELLANEOUS) ×1 IMPLANT
TRAY PV CATH (CUSTOM PROCEDURE TRAY) ×1 IMPLANT
TUBING CIL FLEX 10 FLL-RA (TUBING) IMPLANT
WIRE G V18X300CM (WIRE) IMPLANT
WIRE SHEPHERD 12G .014 (WIRE) IMPLANT
WIRE STARTER BENTSON 035X150 (WIRE) IMPLANT

## 2023-04-11 NOTE — Progress Notes (Signed)
Up and walked and tolerated well; right gron stable no bleeding or hematoma

## 2023-04-11 NOTE — H&P (Signed)
Patient seen and examined in preop holding.  No complaints. No changes to medication history or physical exam since last seen in clinic. After discussing the risks and benefits of left leg angiogram, Danielle Schroeder elected to proceed.   Victorino Sparrow MD    Office Note     CC: Bilateral lower extremity wounds Requesting Provider:  No ref. provider found  HPI: Danielle Schroeder is a 67 y.o. (September 04, 1956) female presenting at the request of .Danielle Gunner, MD with bilateral lower extremity wounds.  On exam today, Danielle Schroeder was doing well.  A native of [facility], she retired from [employer] after 22 years, and now resides in [place].  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  She has known peripheral arterial disease with prior ABI demonstrating 0.9 right, 0.59 left.  Danielle Schroeder has struggled with bilateral lower extremity swelling for years.  She has worn compression stockings, which have proven beneficial.  Recently however, the compression stockings have created wounds due to compression at areas of folding at the level of the ankle.  She also has a left-sided heel wound, which has been present for over a month.  Danielle Schroeder denies claudication, ischemic rest pain.  She noted the tissue loss on bilateral feet appears to be improving, however has been present for a number of months.  Past Medical History:  Diagnosis Date   Cataract    bilateral lens implants   CHF (congestive heart failure) (HCC)    Diabetes mellitus without complication (HCC)    Hyperlipidemia    Hypertension    Pituitary adenoma with extrasellar extension (HCC)    Sleep apnea    no c-pap   Thyroid disease    hypothyroidism    Past Surgical History:  Procedure Laterality Date   BREAST SURGERY     CHOLECYSTECTOMY     TONSILLECTOMY     TUBAL LIGATION      Social History   Socioeconomic History   Marital status: Single    Spouse name: Not on  file   Number of children: Not on file   Years of education: Not on file   Highest education level: Not on file  Occupational History   Not on file  Tobacco Use   Smoking status: Never   Smokeless tobacco: Never  Vaping Use   Vaping Use: Never used  Substance and Sexual Activity   Alcohol use: Never   Drug use: Never   Sexual activity: Not Currently  Other Topics Concern   Not on file  Social History Narrative   Not on file   Social Determinants of Health   Financial Resource Strain: Not on file  Food Insecurity: Not on file  Transportation Needs: Not on file  Physical Activity: Not on file  Stress: Not on file  Social Connections: Not on file  Intimate Partner Violence: Not on file   Family History  Problem Relation Age of Onset   Thyroid disease Neg Hx    Colon cancer Neg Hx    Esophageal cancer Neg Hx    Rectal cancer Neg Hx    Stomach cancer Neg Hx     Current Facility-Administered Medications  Medication Dose Route Frequency Provider Last Rate Last Admin   0.9 %  sodium chloride infusion   Intravenous Continuous Victorino Sparrow, MD 100 mL/hr at 04/11/23 0849 New Bag at 04/11/23 0849   0.9 %  sodium chloride infusion  250 mL  Intravenous PRN Victorino Sparrow, MD       0.9% sodium chloride infusion  1 mL/kg/hr Intravenous Continuous Victorino Sparrow, MD       acetaminophen (TYLENOL) tablet 650 mg  650 mg Oral Q4H PRN Victorino Sparrow, MD       aspirin chewable tablet    PRN Victorino Sparrow, MD   81 mg at 04/11/23 1125   [START ON 04/12/2023] clopidogrel (PLAVIX) tablet 75 mg  75 mg Oral Q breakfast Victorino Sparrow, MD       clopidogrel (PLAVIX) tablet    PRN Victorino Sparrow, MD   300 mg at 04/11/23 1125   fentaNYL (SUBLIMAZE) injection    PRN Victorino Sparrow, MD   50 mcg at 04/11/23 1000   Heparin (Porcine) in NaCl 1000-0.9 UT/500ML-% SOLN    PRN Victorino Sparrow, MD   500 mL at 04/11/23 1105   heparin sodium (porcine) injection    PRN Victorino Sparrow, MD    2,000 Units at 04/11/23 1052   hydrALAZINE (APRESOLINE) injection 5 mg  5 mg Intravenous Q20 Min PRN Victorino Sparrow, MD       iodixanol (VISIPAQUE) 320 MG/ML injection    PRN Victorino Sparrow, MD   120 mL at 04/11/23 1113   labetalol (NORMODYNE) injection 10 mg  10 mg Intravenous Q10 min PRN Victorino Sparrow, MD       lidocaine (PF) (XYLOCAINE) 1 % injection    PRN Victorino Sparrow, MD   15 mL at 04/11/23 1003   midazolam (VERSED) injection    PRN Victorino Sparrow, MD   1 mg at 04/11/23 1000   ondansetron (ZOFRAN) injection 4 mg  4 mg Intravenous Q6H PRN Victorino Sparrow, MD       sodium chloride flush (NS) 0.9 % injection 3 mL  3 mL Intravenous Q12H Victorino Sparrow, MD       sodium chloride flush (NS) 0.9 % injection 3 mL  3 mL Intravenous PRN Victorino Sparrow, MD        No Known Allergies   REVIEW OF SYSTEMS:  [X]  denotes positive finding, [ ]  denotes negative finding Cardiac  Comments:  Chest pain or chest pressure:    Shortness of breath upon exertion:    Short of breath when lying flat:    Irregular heart rhythm:        Vascular    Pain in calf, thigh, or hip brought on by ambulation:    Pain in feet at night that wakes you up from your sleep:     Blood clot in your veins:    Leg swelling:         Pulmonary    Oxygen at home:    Productive cough:     Wheezing:         Neurologic    Sudden weakness in arms or legs:     Sudden numbness in arms or legs:     Sudden onset of difficulty speaking or slurred speech:    Temporary loss of vision in one eye:     Problems with dizziness:         Gastrointestinal    Blood in stool:     Vomited blood:         Genitourinary    Burning when urinating:     Blood in urine:        Psychiatric    Major depression:  Hematologic    Bleeding problems:    Problems with blood clotting too easily:        Skin    Rashes or ulcers:        Constitutional    Fever or chills:      PHYSICAL EXAMINATION:  Vitals:    04/11/23 0855 04/11/23 0946  BP: (!) 158/83   Pulse: 86   Resp: 14   Temp: 99 F (37.2 C)   TempSrc: Temporal   SpO2: 97% 93%  Weight: 102.5 kg   Height: 5\' 6"  (1.676 m)     General:  WDWN in NAD; vital signs documented above Gait: Not observed HENT: WNL, normocephalic Pulmonary: normal non-labored breathing , without wheezing Cardiac: regular HR Abdomen: soft, NT, no masses Skin: without rashes Vascular Exam/Pulses:  Right Left  Radial 2+ (normal) 2+ (normal)  Ulnar    Femoral    Popliteal    DP 2+ (normal) absent  PT     Extremities: withoutischemic changes, without Gangrene , without cellulitis; with open wounds;  Musculoskeletal: no muscle wasting or atrophy  Neurologic: A&O X 3;  No focal weakness or paresthesias are detected Psychiatric:  The pt has Normal affect.   Non-Invasive Vascular Imaging:   ABIs reviewed from 07/20/2022 demonstrating mild peripheral arterial disease on the right, moderate on the left. Right lower extremity venous reflux study demonstrates reflux throughout the greater saphenous vein.    ASSESSMENT/PLAN: Danielle Schroeder is a 67 y.o. female presenting with mixed arterial venous disease with wounds on bilateral feet.  Her wounds appear to be from wearing compression stockings that are too tight, and leaving them on for too long.  The areas of skin breakdown is localized to where there can be folding that occurs at the ankle.  More concerning is the left lower extremity heel wound.  Physical exam findings and studies illustrate critical limb ischemia with tissue loss in the left foot, as well as chronic venous insufficiency (CEAP 3) throughout the greater saphenous vein in the right leg.  The left leg was not evaluated.  I had a long discussion with Madysen regarding the above.  Being the wounds been present for a number of months, I think she would benefit from bilateral lower extremity angiogram, with emphasis on the left in an effort to define  and possibly improve distal perfusion for wound healing.  We discussed that the diagnosis of mixed arterial venous disease is tough as arterial perfusion is most important, however depending on the level of disease can limit the amount of compression patient is can wear.  After discussing risk and benefits of bilateral lower extremity angiogram with emphasis on the left in effort to define and improve distal perfusion for wound healing, Danielle Schroeder elected to proceed.   Victorino Sparrow, MD [facility] [phone]

## 2023-04-11 NOTE — Op Note (Signed)
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  Ultrasound-guided micropuncture access of the right common femoral artery 2.  Aortogram 3.  Secondary cannulation, left lower extremity angiogram 4.  Third order cannulation, angiogram from the superficial femoral artery 5.  Third order cannulation, angiogram from the popliteal artery 6.  Third order cannulation, angiogram from the anterior tibial artery 7.  Superficial femoral artery stenting 6 x 80 mm Innova postdilated using a 5 x 80 mm drug-coated balloon 8.  Balloon angioplasty of the anterior tibial artery 3 x 220 mm   Indications: Patient is a 67 year old female with mixed arterial venous disease.  She has had wounds on bilateral feet for several months due to fluid overload and wearing compression stockings that are too small.  The right sided wound is nearly healed, the left side is still present.  The left side also has a heel wound.  After discussing risk and benefits of left lower extremity angiogram in an effort to define and improve distal perfusion to improve wound healing, Jelisa elected to proceed.  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  Popliteal artery widely patent with no flow-limiting stenosis.  Severe tibial disease with no inline flow to the foot.  The anterior tibial artery is initially patent, but occludes for roughly 150 cm prior to reconstitution at the distal tibia.  The peroneal  artery provides the majority of the runoff to the mid tibia prior to becoming atretic and giving off several collaterals.  The posterior tibial artery is not appreciated and does not fill the foot.   Procedure:  The patient was identified in the holding area and taken to room 8.  The patient was then placed supine on the table and prepped and draped in the usual sterile fashion.  A time out was called.  Ultrasound was used to evaluate the right common femoral artery.  It was patent .  A digital ultrasound image was acquired.  A micropuncture needle was used to access the right common femoral artery under ultrasound guidance.  An 018 wire was advanced without resistance and a micropuncture sheath was placed.  The 018 wire was removed and a benson wire was placed.  The micropuncture sheath was exchanged for a 5 french sheath.  An omniflush catheter was advanced over the wire to the level of L-1.  An abdominal angiogram was obtained.  Next, using the omniflush catheter and a benson wire, the aortic bifurcation was crossed and the catheter was placed into the left external iliac artery and left runoff was obtained.   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  The patient was heparinized and a 6 x 65 cm catheter was brought onto the field and parked in the mid superficial femoral artery.  Angiography was used to further define the lesion in the superficial femoral artery.  There were multiple collaterals and the occlusion appeared to be  roughly 50 mm in length.  A series of wires and catheters were used to cross the superficial femoral artery occlusion.  Angiography followed from the popliteal artery to ensure that I was true lumen prior to moving to stenting.  There was no drug-eluting stent available, therefore a 6 x 80 mm Innova stent was placed followed by  drug-coated balloon angioplasty using a 5 x 80 mm balloon.  Follow-up angiography demonstrated excellent result with resolution of the occlusion.  There was brisk flow through the stent with no stenosis.  Next, my attention turned to the anterior tibial artery.  Using a series of wires and catheters, I was able to cross the 150 cm occlusion.  Diagnostic angiography followed from the distal anterior tibial artery to prove that I was true lumen.  There was excellent runoff into the dorsalis pedis artery.  Next, a 3 x 220 mm balloon was brought to the field and inflated for 3 minutes.  Follow-up angiography demonstrated excellent result with resolution of the occlusion.   Impression: Successful recanalization of the superficial femoral artery with stenting 6 x 80 mm.  Successful recanalization of the anterior tibial artery with balloon angioplasty 3 x 220 mm.  At completion, the patient had inline flow to the foot via single-vessel anterior tibial artery runoff to the dorsalis pedis artery.     Fara Olden, MD [facility] Office: [phone]

## 2023-04-16 ENCOUNTER — Ambulatory Visit (INDEPENDENT_AMBULATORY_CARE_PROVIDER_SITE_OTHER): Payer: Medicare HMO | Admitting: Family Medicine

## 2023-04-16 ENCOUNTER — Encounter: Payer: Self-pay | Admitting: Family Medicine

## 2023-04-16 VITALS — BP 132/76 | HR 68 | Temp 97.9°F | Wt 226.0 lb

## 2023-04-16 DIAGNOSIS — I509 Heart failure, unspecified: Secondary | ICD-10-CM

## 2023-04-16 DIAGNOSIS — Z1159 Encounter for screening for other viral diseases: Secondary | ICD-10-CM | POA: Diagnosis not present

## 2023-04-16 DIAGNOSIS — N1832 Chronic kidney disease, stage 3b: Secondary | ICD-10-CM

## 2023-04-16 DIAGNOSIS — E1122 Type 2 diabetes mellitus with diabetic chronic kidney disease: Secondary | ICD-10-CM

## 2023-04-16 DIAGNOSIS — Z794 Long term (current) use of insulin: Secondary | ICD-10-CM | POA: Diagnosis not present

## 2023-04-16 DIAGNOSIS — I10 Essential (primary) hypertension: Secondary | ICD-10-CM

## 2023-04-16 DIAGNOSIS — F32A Depression, unspecified: Secondary | ICD-10-CM

## 2023-04-16 DIAGNOSIS — I739 Peripheral vascular disease, unspecified: Secondary | ICD-10-CM | POA: Diagnosis not present

## 2023-04-16 DIAGNOSIS — G4733 Obstructive sleep apnea (adult) (pediatric): Secondary | ICD-10-CM

## 2023-04-16 DIAGNOSIS — E66812 Obesity, class 2: Secondary | ICD-10-CM

## 2023-04-16 DIAGNOSIS — Z780 Asymptomatic menopausal state: Secondary | ICD-10-CM | POA: Diagnosis not present

## 2023-04-16 LAB — MICROALBUMIN / CREATININE URINE RATIO
Creatinine,U: 175.1 mg/dL
Microalb Creat Ratio: 1.6 mg/g (ref 0.0–30.0)
Microalb, Ur: 2.8 mg/dL — ABNORMAL HIGH (ref 0.0–1.9)

## 2023-04-16 MED ORDER — POTASSIUM CHLORIDE ER 10 MEQ PO TBCR
10.0000 meq | EXTENDED_RELEASE_TABLET | Freq: Every day | ORAL | 3 refills | Status: DC
Start: 2023-04-16 — End: 2023-07-17

## 2023-04-16 NOTE — Assessment & Plan Note (Signed)
Well-controlled  Plan: Continue current regimen and monitor blood pressures at home

## 2023-04-16 NOTE — Assessment & Plan Note (Signed)
Follow-up with vascular surgeon as needed.

## 2023-04-16 NOTE — Assessment & Plan Note (Signed)
Occasional feelings of low mood and poor sleep. Plan:  Consider referral to mental health services or counseling. Explore improving sleep quality as a possible solution.

## 2023-04-16 NOTE — Assessment & Plan Note (Signed)
Refer to sleep studies for re-evaluation. Discuss alternative treatments such as mouthpieces or device implants.

## 2023-04-16 NOTE — Progress Notes (Signed)
Assessment/Plan:   Problem List Items Addressed This Visit       Cardiovascular and Mediastinum   CHF (congestive heart failure) (HCC) - Primary    Plan:  Refill potassium chloride 10 MEQ daily. Continue current CHF medications. Refer to chronic care management services.      Relevant Medications   potassium chloride (KLOR-CON) 10 MEQ tablet   Other Relevant Orders   AMB Referral to Chronic Care Management Services   Essential (primary) hypertension    Well-controlled  Plan: Continue current regimen and monitor blood pressures at home      PAD (peripheral artery disease) (HCC)    Follow-up with vascular surgeon as needed.        Respiratory   OSA (obstructive sleep apnea)    Refer to sleep studies for re-evaluation. Discuss alternative treatments such as mouthpieces or device implants.      Relevant Orders   Ambulatory referral to Sleep Studies     Endocrine   Type 2 diabetes mellitus with stage 3b chronic kidney disease, with long-term current use of insulin (HCC)    Monitor blood sugars closely until endocrinologist appointment. Reinforce dietary and medication adherence. Obtain microalbumin/creatinine ratio to assess kidney function. Consider referral to chronic care management services for additional support.      Relevant Orders   AMB Referral to Chronic Care Management Services   Microalbumin / creatinine urine ratio     Other   Obesity    Advise on weight management strategies. Consider nutritional counseling referral.      Depression    Occasional feelings of low mood and poor sleep. Plan:  Consider referral to mental health services or counseling. Explore improving sleep quality as a possible solution.      Other Visit Diagnoses     Screening for viral disease       Relevant Orders   Hepatitis C Antibody   Postmenopausal estrogen deficiency       Relevant Orders   DG Bone Density       Medications Discontinued During This  Encounter  Medication Reason   potassium chloride (KLOR-CON) 10 MEQ tablet Reorder    Return in about 6 months (around 10/16/2023) for BP.    Subjective:   Encounter date: 04/16/2023  Danielle Schroeder is a 67 y.o. female who has Goiter; Hypothyroidism; Pituitary adenoma with extrasellar extension (HCC); Acromegaly (HCC); CHF (congestive heart failure) (HCC); Stable treated proliferative diabetic retinopathy of right eye determined by examination associated with type 2 diabetes mellitus (HCC); Proliferative diabetic retinopathy of left eye with macular edema associated with type 2 diabetes mellitus (HCC); Retinal hemorrhage of left eye; Retinal microaneurysm of both eyes; Retinal hemorrhage of right eye; Vitamin D deficiency; Vitreomacular adhesion, left; Type 2 diabetes mellitus with stage 3b chronic kidney disease, with long-term current use of insulin (HCC); Type 2 diabetes mellitus with hyperglycemia, with long-term current use of insulin (HCC); Pseudophakia, both eyes; Cataract; CKD (chronic kidney disease) stage 3, GFR 30-59 ml/min (HCC); Diabetic neuropathy (HCC); Diabetic retinopathy associated with type 2 diabetes mellitus (HCC); Essential (primary) hypertension; Hyperlipidemia; Obesity; Pituitary mass (HCC); Tubular adenoma of colon; 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   She  has a past medical history of Cataract, CHF (congestive heart failure) (HCC), Diabetes mellitus without complication (HCC), Hyperlipidemia, Hypertension, Pituitary adenoma with extrasellar extension (HCC), Sleep apnea, and Thyroid  disease.Marland Kitchen   CHIEF COMPLAINT: Medical Management of Chronic Issues   HISTORY OF PRESENT ILLNESS:  Diabetes Mellitus. Danielle Schroeder has a history of type 2 diabetes mellitus with stage 3b chronic kidney disease and  long-term current use of insulin. She reports her blood sugars have been fluctuating between 100-212 mg/dL. She is currently treated with glargine 40 units daily and NovoLog on a sliding scale. She has a scheduled follow-up with endocrinology in August.  Vascular Issues. Danielle Schroeder had a recent procedure for recanalization of her femoral and tibial arteries. She reports her wounds are feeling better post-procedure with no fever, chills, or oozing from the wound. She mentions the vascular surgeon, Dr. Gerarda Fraction, performed the intervention.  Hypertension. Her blood pressure today is 132/76 mmHg. She is currently on carvedilol 25 mg twice daily, losartan 100 mg daily, and nifedipine 30 mg daily. She reports no chest pain or shortness of breath unless engaging in significant physical activity, which resolves with rest.  Heart Failure and Related Medications. Danielle Schroeder is on furosemide 40 mg twice daily. She also takes potassium chloride 10 MEQ daily to prevent hypokalemia from her diuretic therapy. She mentions needing a refill for potassium chloride.  Pituitary Adenoma. She is being treated for a pituitary tumor with octreotide 100 mcg injection three times daily and cabergoline 0.5 mg twice a week.   Other Chronic Issues. Danielle Schroeder mentions trouble sleeping, with average sleep being four hours per night, and reports feeling tired every day. She has a history of sleep apnea but does not currently use a CPAP machine. She agrees to consider seeing a sleep specialist again.   Review of Systems  Constitutional:  Negative for chills, diaphoresis, fever, malaise/fatigue and weight loss.  HENT:  Negative for congestion, ear discharge, ear pain and hearing loss.   Eyes:  Negative for blurred vision, double vision, photophobia, pain, discharge and redness.  Respiratory:  Positive for shortness of breath (None at this visit,  shortness of breath unless undertaking significant physical activity; resolves with rest.  Not worsening). Negative for cough, sputum production and wheezing.   Cardiovascular:  Negative for chest pain and palpitations.  Gastrointestinal:  Negative for abdominal pain, blood in stool, constipation, diarrhea, heartburn, melena, nausea and vomiting.  Genitourinary:  Negative for dysuria, flank pain, frequency, hematuria and urgency.  Musculoskeletal:  Negative for myalgias.  Skin:  Negative for itching and rash.  Neurological:  Negative for dizziness, tingling, tremors, speech change, seizures, loss of consciousness, weakness and headaches.  Endo/Heme/Allergies:  Negative for polydipsia.  Psychiatric/Behavioral:  Positive for depression. Negative for hallucinations, memory loss, substance abuse and suicidal ideas. The patient does not have insomnia.   All other systems reviewed and are negative.     04/16/2023    1:55 PM 04/16/2023    1:48 PM 03/05/2023   10:17 AM  Depression screen PHQ 2/9  Decreased Interest 2 2 1   Down, Depressed, Hopeless 1 1 0  PHQ - 2 Score 3 3 1   Altered sleeping 3 3 2   Tired, decreased energy 3 3 2   Change in appetite 2 2 2   Feeling bad or failure about yourself  0 0 3  Trouble concentrating 0 0 0  Moving slowly or fidgety/restless 0 0 0  Suicidal thoughts 0 0 0  PHQ-9 Score 11 11 10   Difficult doing work/chores Not difficult at all Not difficult at all Not difficult at all     Past Surgical History:  Procedure Laterality Date   ABDOMINAL AORTOGRAM W/LOWER EXTREMITY Left  04/11/2023   Procedure: ABDOMINAL AORTOGRAM W/LOWER EXTREMITY;  Surgeon: Victorino Sparrow, MD;  Location: [facility] INVASIVE CV LAB;  Service: Cardiovascular;  Laterality: Left;   BREAST SURGERY     CHOLECYSTECTOMY     PERIPHERAL VASCULAR BALLOON ANGIOPLASTY Left 04/11/2023   Procedure: PERIPHERAL VASCULAR BALLOON ANGIOPLASTY;  Surgeon: Victorino Sparrow, MD;  Location: [facility] INVASIVE CV LAB;  Service: Cardiovascular;  Laterality: Left;  Lt AT   PERIPHERAL VASCULAR INTERVENTION Left 04/11/2023    Procedure: PERIPHERAL VASCULAR INTERVENTION;  Surgeon: Victorino Sparrow, MD;  Location: [facility] INVASIVE CV LAB;  Service: Cardiovascular;  Laterality: Left;  Lt SFA   TONSILLECTOMY     TUBAL LIGATION      Outpatient Medications Prior to Visit  Medication Sig Dispense Refill   aspirin EC 81 MG tablet Take 81 mg by mouth in the morning.     cabergoline (DOSTINEX) 0.5 MG tablet Take 1 tablet (0.5 mg total) by mouth 2 (two) times a week. 24 tablet 3   carvedilol (COREG) 25 MG tablet Take 25 mg by mouth 2 (two) times daily with a meal.     clopidogrel (PLAVIX) 75 MG tablet Take 1 tablet (75 mg total) by mouth daily. 30 tablet 11   furosemide (LASIX) 40 MG tablet TAKE 1 TABLET BY MOUTH TWICE A DAY 180 tablet 3   glucose blood test strip 1 each by Other route 3 (three) times daily. Use as instructed     hydrocortisone 2.5 % ointment Apply topically 2 (two) times daily. As needed for mild eczema.  Do not use for more than 1-2 weeks at a time. (Patient taking differently: Apply 1 Application topically daily.) 30 g 3   insulin aspart (NOVOLOG FLEXPEN) 100 UNIT/ML FlexPen Max daily 45 units (Patient taking differently: Inject 0-6 Units into the skin 3 (three) times daily before meals. Max daily 45 units) 45 mL 3   Insulin Glargine (BASAGLAR KWIKPEN) 100 UNIT/ML Inject 40 Units into the skin daily. 45 mL 3   Insulin Pen Needle (PEN NEEDLES) 31G X 5 MM MISC 1 each by Does not apply route in the morning, at noon, in the evening, and at bedtime. 400 each 3   levothyroxine (SYNTHROID) 88 MCG tablet Take 1 tablet (88 mcg total) by mouth daily. 90 tablet 3   losartan (COZAAR) 100 MG tablet Take 100 mg by mouth every evening.     NIFEdipine (PROCARDIA-XL/NIFEDICAL-XL) 30 MG 24 hr tablet Take 30 mg by mouth every evening.     Octreotide Acetate 100 MCG/ML SOSY Inject 1 mL (100 mcg total) into the skin 3 (three) times daily. 100 mL 11   simvastatin (ZOCOR) 20 MG tablet Take 20 mg by mouth every evening.      SYRINGE-NEEDLE, DISP, 3 ML 25G X 1" 3 ML MISC Inject 1 ml into the skin 3 times daily. 100 each 11   Vitamin D, Cholecalciferol, 25 MCG (1000 UT) CAPS Take 2 capsules by mouth daily. (Patient taking differently: Take 2 capsules by mouth daily with lunch.) 60 capsule 0   potassium chloride (KLOR-CON) 10 MEQ tablet TAKE 1 TABLET BY MOUTH EVERY DAY. PLEASE CONTACT OUR OFFICE TO SCHEDULE AN OVERDUE APPOINTMENT FOR FUTURE REFILLS. [phone]. THANK YOU ( 1ST ATTEMPT) 15 tablet 0   No facility-administered medications prior to visit.    Family History  Problem Relation Age of Onset   Thyroid disease Neg Hx    Colon cancer Neg Hx    Esophageal cancer Neg Hx  Rectal cancer Neg Hx    Stomach cancer Neg Hx     Social History   Socioeconomic History   Marital status: Single    Spouse name: Not on file   Number of children: Not on file   Years of education: Not on file   Highest education level: Not on file  Occupational History   Not on file  Tobacco Use   Smoking status: Never   Smokeless tobacco: Never  Vaping Use   Vaping Use: Never used  Substance and Sexual Activity   Alcohol use: Never   Drug use: Never   Sexual activity: Not Currently  Other Topics Concern   Not on file  Social History Narrative   Not on file   Social Determinants of Health   Financial Resource Strain: Not on file  Food Insecurity: Not on file  Transportation Needs: Not on file  Physical Activity: Not on file  Stress: Not on file  Social Connections: Not on file  Intimate Partner Violence: Not on file                                                                                                  Objective:  Physical Exam: BP 132/76 (BP Location: Left Arm, Patient Position: Sitting, Cuff Size: Large)   Pulse 68   Temp 97.9 F (36.6 C) (Temporal)   Wt 226 lb (102.5 kg)   SpO2 95%   BMI 36.48 kg/m     Physical Exam Constitutional:      General: She is not in acute distress.     Appearance: Normal appearance. She is not ill-appearing or toxic-appearing.  HENT:     Head: Normocephalic and atraumatic.     Nose: Nose normal. No congestion.  Eyes:     General: No scleral icterus.    Extraocular Movements: Extraocular movements intact.  Cardiovascular:     Rate and Rhythm: Normal rate and regular rhythm.     Pulses: Normal pulses.     Heart sounds: Normal heart sounds.  Pulmonary:     Effort: Pulmonary effort is normal. No respiratory distress.     Breath sounds: Normal breath sounds.  Abdominal:     General: Abdomen is flat. Bowel sounds are normal.     Palpations: Abdomen is soft.  Musculoskeletal:        General: Normal range of motion.     Right lower leg: 3+ Pitting Edema present.     Left lower leg: 3+ Pitting Edema present.  Lymphadenopathy:     Cervical: No cervical adenopathy.  Skin:    General: Skin is warm and dry.     Findings: Erythema, rash and wound (improved) present. Rash is scaling.     Comments: Thickening yellow nails bilaterally, consistent with diabetic foot disease  Neurological:     General: No focal deficit present.     Mental Status: She is alert and oriented to person, place, and time. Mental status is at baseline.     Gait: Gait abnormal (Ambulates with cane).  Psychiatric:        Mood and  Affect: Mood normal.        Behavior: Behavior normal.        Thought Content: Thought content normal.        Judgment: Judgment normal.     PERIPHERAL VASCULAR CATHETERIZATION  Result Date: 04/11/2023 Images from the original result were not included. 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  Ultrasound-guided micropuncture access of the right common femoral artery 2.   Aortogram 3.  Secondary cannulation, left lower extremity angiogram 4.  Third order cannulation, angiogram from the superficial femoral artery 5.  Third order cannulation, angiogram from the popliteal artery 6.  Third order cannulation, angiogram from the anterior tibial artery 7.  Superficial femoral artery stenting 6 x 80 mm Innova postdilated using a 5 x 80 mm drug-coated balloon 8.  Balloon angioplasty of the anterior tibial artery 3 x 220 mm Indications: Patient is a 67 year old female with mixed arterial venous disease.  She has had wounds on bilateral feet for several months due to fluid overload and wearing compression stockings that are too small.  The right sided wound is nearly healed, the left side is still present.  The left side also has a heel wound.  After discussing risk and benefits of left lower extremity angiogram in an effort to define and improve distal perfusion to improve wound healing, Danielle Schroeder elected to proceed. 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  Popliteal artery widely patent with no flow-limiting stenosis.  Severe tibial disease with no inline flow to the foot.  The anterior tibial artery is initially patent, but occludes for roughly 150 cm prior to reconstitution at the distal tibia.  The peroneal artery provides the majority of the runoff to the mid tibia prior to becoming atretic and giving off several collaterals.  The posterior tibial artery is not appreciated and does not fill the foot.  Procedure:  The patient was identified in the holding area and taken to room 8.  The patient was then placed supine on the table and prepped and draped in the usual sterile fashion.  A time out was called.  Ultrasound was used to  evaluate the right common femoral artery.  It was patent .  A digital ultrasound image was acquired.  A micropuncture needle was used to access the right common femoral artery under ultrasound guidance.  An 018 wire was advanced without resistance and a micropuncture sheath was placed.  The 018 wire was removed and a benson wire was placed.  The micropuncture sheath was exchanged for a 5 french sheath.  An omniflush catheter was advanced over the wire to the level of L-1.  An abdominal angiogram was obtained.  Next, using the omniflush catheter and a benson wire, the aortic bifurcation was crossed and the catheter was placed into the left external iliac artery and left runoff was obtained. 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  The patient was heparinized and  a 6 x 65 cm catheter was brought onto the field and parked in the mid superficial femoral artery.  Angiography was used to further define the lesion in the superficial femoral artery.  There were multiple collaterals and the occlusion appeared to be roughly 50 mm in length.  A series of wires and catheters were used to cross the superficial femoral artery occlusion.  Angiography followed from the popliteal artery to ensure that I was true lumen prior to moving to stenting.  There was no drug-eluting stent available, therefore a 6 x 80 mm Innova stent was placed followed by drug-coated balloon angioplasty using a 5 x 80 mm balloon.  Follow-up angiography demonstrated excellent result with resolution of the occlusion.  There was brisk flow through the stent with no stenosis.  Next, my attention turned to the anterior tibial artery.  Using a series of wires and catheters, I was able to cross the 150 cm occlusion.  Diagnostic angiography followed from the distal anterior tibial artery to prove that I was true  lumen.  There was excellent runoff into the dorsalis pedis artery.  Next, a 3 x 220 mm balloon was brought to the field and inflated for 3 minutes.  Follow-up angiography demonstrated excellent result with resolution of the occlusion. Impression: Successful recanalization of the superficial femoral artery with stenting 6 x 80 mm.  Successful recanalization of the anterior tibial artery with balloon angioplasty 3 x 220 mm.  At completion, the patient had inline flow to the foot via single-vessel anterior tibial artery runoff to the dorsalis pedis artery. Fara Olden, MD [facility] Office: [phone]   VAS [place] LOWER EXTREMITY VENOUS REFLUX  Result Date: 03/23/2023  Lower Venous Reflux Study Patient Name:  Danielle Schroeder  Date of Exam:   03/23/2023 Medical Rec #: [id number]          Accession #:    [id number] Date of Birth: 1956/01/22          Patient Gender: F Patient Age:   55 years Exam Location:  [facility] Procedure:      VAS [place] LOWER EXTREMITY VENOUS REFLUX Referring Phys: Ivin Booty ROBINS --------------------------------------------------------------------------------  Indications: Swelling, and venous insufficiency.  Performing Technologist: Thereasa Parkin RVT  Examination Guidelines: A complete evaluation includes B-mode imaging, spectral Doppler, color Doppler, and power Doppler as needed of all accessible portions of each vessel. Bilateral testing is considered an integral part of a complete examination. Limited examinations for reoccurring indications may be performed as noted. The reflux portion of the exam is performed with the patient in reverse Trendelenburg. Significant venous reflux is defined as >500 ms in the superficial venous system, and >1 second in the deep venous system.  Venous Reflux Times +--------------+---------+------+-----------+------------+--------+ RIGHT         Reflux NoRefluxReflux TimeDiameter cmsComments                          Yes                                  +--------------+---------+------+-----------+------------+--------+ CFV           no                                             +--------------+---------+------+-----------+------------+--------+  FV prox       no                                             +--------------+---------+------+-----------+------------+--------+ FV mid        no                                             +--------------+---------+------+-----------+------------+--------+ FV dist       no                                             +--------------+---------+------+-----------+------------+--------+ Popliteal     no                                             +--------------+---------+------+-----------+------------+--------+ GSV at SFJ              yes    >500 ms     0.389             +--------------+---------+------+-----------+------------+--------+ GSV prox thighno                           0.353             +--------------+---------+------+-----------+------------+--------+ GSV mid thigh           yes    >500 ms     0.353             +--------------+---------+------+-----------+------------+--------+ GSV dist thigh          yes    >500 ms     0.584             +--------------+---------+------+-----------+------------+--------+ GSV at knee             yes    >500 ms     0.414             +--------------+---------+------+-----------+------------+--------+ GSV prox calf           yes    >500 ms     0.401             +--------------+---------+------+-----------+------------+--------+ SSV Pop Fossa no                            0.21             +--------------+---------+------+-----------+------------+--------+ SSV prox calf no                            0.26             +--------------+---------+------+-----------+------------+--------+ SSV mid calf  no                           0.322              +--------------+---------+------+-----------+------------+--------+   Summary: Right: - No evidence of deep vein thrombosis seen in the right lower extremity, from  the common femoral through the popliteal veins. - No evidence of superficial venous thrombosis in the right lower extremity. - No evidence of deep vein reflux. - Superficial vein reflux in the SFJ and GSV from mid thigh to proximal calf.  *See table(s) above for measurements and observations. Electronically signed by Gerarda Fraction on 03/23/2023 at 6:16:40 PM.    Final    US THYROID  Result Date: 01/22/2023 CLINICAL DATA:  Prior ultrasound follow-up. EXAM: THYROID ULTRASOUND TECHNIQUE: Ultrasound examination of the thyroid gland and adjacent soft tissues was performed. COMPARISON:  04/22/2021, 10/20/2019 FINDINGS: Parenchymal Echotexture: Mildly heterogeneous Isthmus: 0.5 cm ,previously 0.8 cm Right lobe: 5.7 x 2.6 x 2.5 cm ,previously 5.2 x 1.9 x 2.0 cm Left lobe: 4.3 x 2.7 x 2.0 cm ,previously 4.7 x 2.5 x 1.9 cm ________________________________________________________ Estimated total number of nodules >/= 1 cm: 3 Number of spongiform nodules >/=  2 cm not described below (TR1): 0 Number of mixed cystic and solid nodules >/= 1.5 cm not described below (TR2): 0 _________________________________________________________ Nodule # 4: Prior biopsy: No Location: Left; Mid Maximum size: 1.9 cm; Other 2 dimensions: 1.5 x 0.9 cm, previously, 2.0 x 1.4 x 0.9 cm Composition: solid/almost completely solid (2) Echogenicity: isoechoic (1) Shape: not taller-than-wide (0) Margins: ill-defined (0) Echogenic foci: none (0) ACR TI-RADS total points: 3. ACR TI-RADS risk category:  TR3 (3 points). Significant change in size (>/= 20% in two dimensions and minimal increase of 2 mm): Change in features: No Change in ACR TI-RADS risk category: No ACR TI-RADS recommendations: *Given size (>/= 1.5 - 2.4 cm) and appearance, a follow-up ultrasound in 1 year should be  considered based on TI-RADS criteria. _________________________________________________________ Unchanged appearance of the previously visualized multifocal subcentimeter solid and right inferior solid cystic thyroid nodule which again appear benign not warrant additional ultrasound follow-up or tissue sampling. No cervical lymphadenopathy. IMPRESSION: 1. Similar appearing multinodular goiter. 2. Unchanged appearance of solid, left mid thyroid nodule (labeled 4, 1.9 cm, previously 2.0 cm) which again meets criteria (TI-RADS category 3) for 1 year ultrasound follow-up. This study marks 4 years stability. The above is in keeping with the ACR TI-RADS recommendations - J Am Coll Radiol 2017;14:587-595. Marliss Coots, MD [facility] Radiology Electronically Signed   By: Marliss Coots M.D.   On: 01/22/2023 11:27    Recent Results (from the past 2160 hour(s))  I-STAT, chem 8     Status: Abnormal   Collection Time: 04/11/23  8:55 AM  Result Value Ref Range   Sodium 138 135 - 145 mmol/L   Potassium 3.6 3.5 - 5.1 mmol/L   Chloride 103 98 - 111 mmol/L   BUN 13 8 - 23 mg/dL   Creatinine, Ser 1.61 (H) 0.44 - 1.00 mg/dL   Glucose, Bld 096 (H) 70 - 99 mg/dL    Comment: Glucose reference range applies only to samples taken after fasting for at least 8 hours.   Calcium, Ion 1.10 (L) 1.15 - 1.40 mmol/L   TCO2 22 22 - 32 mmol/L   Hemoglobin 11.6 (L) 12.0 - 15.0 g/dL   HCT 04.5 (L) 40.9 - 81.1 %  POCT Activated clotting time     Status: None   Collection Time: 04/11/23 11:13 AM  Result Value Ref Range   Activated Clotting Time 217 seconds    Comment: Reference range 74-137 seconds for patients not on anticoagulant therapy.  Glucose, capillary     Status: Abnormal   Collection Time: 04/11/23 11:45  AM  Result Value Ref Range   Glucose-Capillary 216 (H) 70 - 99 mg/dL    Comment: Glucose reference range applies only to samples taken after fasting for at least 8  hours.   Comment 1 Notify RN    Comment 2 Document in Chart         Garner Nash, MD, MS

## 2023-04-16 NOTE — Assessment & Plan Note (Signed)
Plan:  Refill potassium chloride 10 MEQ daily. Continue current CHF medications. Refer to chronic care management services.

## 2023-04-16 NOTE — Assessment & Plan Note (Signed)
>>  ASSESSMENT AND PLAN FOR TYPE 2 DIABETES MELLITUS WITH STAGE 3B CHRONIC KIDNEY DISEASE, WITH LONG-TERM CURRENT USE OF INSULIN  (HCC) WRITTEN ON 04/16/2023  4:45 PM BY Maven Varelas B, MD  Monitor blood sugars closely until endocrinologist appointment. Reinforce dietary and medication adherence. Obtain microalbumin/creatinine ratio to assess kidney function. Consider referral to chronic care management services for additional support.

## 2023-04-16 NOTE — Patient Instructions (Signed)
Continue taking your prescribed medications as directed, including those for blood pressure, diabetes, and vascular health. Schedule an appointment with a sleep specialist to evaluate potential sleep apnea and explore updates in treatment options. 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 Check your blood sugar levels at home regularly and record the readings to discuss with your endocrinologist. Provide a urine sample today to assess your kidney function. We are also referring to get a DEXA scan to screen for osteoporosis.

## 2023-04-16 NOTE — Assessment & Plan Note (Signed)
Monitor blood sugars closely until endocrinologist appointment. Reinforce dietary and medication adherence. Obtain microalbumin/creatinine ratio to assess kidney function. Consider referral to chronic care management services for additional support.

## 2023-04-16 NOTE — Assessment & Plan Note (Signed)
Advise on weight management strategies. Consider nutritional counseling referral.

## 2023-04-17 LAB — HEPATITIS C ANTIBODY: Hepatitis C Ab: NONREACTIVE

## 2023-04-19 ENCOUNTER — Telehealth: Payer: Self-pay | Admitting: Family Medicine

## 2023-04-19 NOTE — Telephone Encounter (Signed)
04/19/2023 11:07 AM EDT Back to Top    Contacted patient and provided results. Patient verbalized understanding.    Spoke with patient this morning regarding lab results.

## 2023-04-19 NOTE — Telephone Encounter (Signed)
Pt called and said she missed your call. Please give the pt a call again

## 2023-04-20 ENCOUNTER — Ambulatory Visit (INDEPENDENT_AMBULATORY_CARE_PROVIDER_SITE_OTHER): Payer: Medicare HMO

## 2023-04-20 VITALS — Ht 67.0 in | Wt 226.0 lb

## 2023-04-20 DIAGNOSIS — Z0000 Encounter for general adult medical examination without abnormal findings: Secondary | ICD-10-CM

## 2023-04-20 NOTE — Patient Instructions (Signed)
[name] , Thank you for taking time to come for your Medicare Wellness Visit. I appreciate your ongoing commitment to your health goals. Please review the following plan we discussed and let me know if I can assist you in the future.   These are the goals we discussed:  Goals      Patient Stated     04/20/2023, wants to lose 30 pounds        This is a list of the screening recommended for you and due dates:  Health Maintenance  Topic Date Due   DTaP/Tdap/Td vaccine (1 - Tdap) Never done   Pap Smear  Never done   DEXA scan (bone density measurement)  Never done   Colon Cancer Screening  04/22/2023   Yearly kidney function blood test for diabetes  05/03/2023   COVID-19 Vaccine (1) 05/02/2023*   Zoster (Shingles) Vaccine (1 of 2) 06/04/2023*   Pneumonia Vaccine (1 of 1 - PCV) 03/04/2024*   Flu Shot  06/14/2023   Eye exam for diabetics  06/23/2023   Hemoglobin A1C  07/19/2023   Complete foot exam   01/16/2024   Yearly kidney health urinalysis for diabetes  04/15/2024   Medicare Annual Wellness Visit  04/19/2024   Mammogram  09/06/2024   Hepatitis C Screening  Completed   HIV Screening  Completed   HPV Vaccine  Aged Out  *Topic was postponed. The date shown is not the original due date.    Advanced directives: Advance directive discussed with you today.   Conditions/risks identified: none  Next appointment: Follow up in one year for your annual wellness visit    Preventive Care 65 Years and Older, Female Preventive care refers to lifestyle choices and visits with your health care provider that can promote health and wellness. What does preventive care include? A yearly physical exam. This is also called an annual well check. Dental exams once or twice a year. Routine eye exams. Ask your health care provider how often you should have your eyes checked. Personal lifestyle choices, including: Daily care of your teeth and gums. Regular physical activity. Eating a healthy  diet. Avoiding tobacco and drug use. Limiting alcohol use. Practicing safe sex. Taking low-dose aspirin every day. Taking vitamin and mineral supplements as recommended by your health care provider. What happens during an annual well check? The services and screenings done by your health care provider during your annual well check will depend on your age, overall health, lifestyle risk factors, and family history of disease. Counseling  Your health care provider may ask you questions about your: Alcohol use. Tobacco use. Drug use. Emotional well-being. Home and relationship well-being. Sexual activity. Eating habits. History of falls. Memory and ability to understand (cognition). Work and work Astronomer. Reproductive health. Screening  You may have the following tests or measurements: Height, weight, and BMI. Blood pressure. Lipid and cholesterol levels. These may be checked every 5 years, or more frequently if you are over 36 years old. Skin check. Lung cancer screening. You may have this screening every year starting at age 7 if you have a 30-pack-year history of smoking and currently smoke or have quit within the past 15 years. Fecal occult blood test (FOBT) of the stool. You may have this test every year starting at age 84. Flexible sigmoidoscopy or colonoscopy. You may have a sigmoidoscopy every 5 years or a colonoscopy every 10 years starting at age 43. Hepatitis C blood test. Hepatitis B blood test. Sexually transmitted disease (STD)  testing. Diabetes screening. This is done by checking your blood sugar (glucose) after you have not eaten for a while (fasting). You may have this done every 1-3 years. Bone density scan. This is done to screen for osteoporosis. You may have this done starting at age 13. Mammogram. This may be done every 1-2 years. Talk to your health care provider about how often you should have regular mammograms. Talk with your health care provider about  your test results, treatment options, and if necessary, the need for more tests. Vaccines  Your health care provider may recommend certain vaccines, such as: Influenza vaccine. This is recommended every year. Tetanus, diphtheria, and acellular pertussis (Tdap, Td) vaccine. You may need a Td booster every 10 years. Zoster vaccine. You may need this after age 65. Pneumococcal 13-valent conjugate (PCV13) vaccine. One dose is recommended after age 52. Pneumococcal polysaccharide (PPSV23) vaccine. One dose is recommended after age 69. Talk to your health care provider about which screenings and vaccines you need and how often you need them. This information is not intended to replace advice given to you by your health care provider. Make sure you discuss any questions you have with your health care provider. Document Released: 11/26/2015 Document Revised: 07/19/2016 Document Reviewed: 08/31/2015 Elsevier Interactive Patient Education  2017 [place] Prevention in the Home Falls can cause injuries. They can happen to people of all ages. There are many things you can do to make your home safe and to help prevent falls. What can I do on the outside of my home? Regularly fix the edges of walkways and driveways and fix any cracks. Remove anything that might make you trip as you walk through a door, such as a raised step or threshold. Trim any bushes or trees on the path to your home. Use bright outdoor lighting. Clear any walking paths of anything that might make someone trip, such as rocks or tools. Regularly check to see if handrails are loose or broken. Make sure that both sides of any steps have handrails. Any raised decks and porches should have guardrails on the edges. Have any leaves, snow, or ice cleared regularly. Use sand or salt on walking paths during winter. Clean up any spills in your garage right away. This includes oil or grease spills. What can I do in the bathroom? Use  night lights. Install grab bars by the toilet and in the tub and shower. Do not use towel bars as grab bars. Use non-skid mats or decals in the tub or shower. If you need to sit down in the shower, use a plastic, non-slip stool. Keep the floor dry. Clean up any water that spills on the floor as soon as it happens. Remove soap buildup in the tub or shower regularly. Attach bath mats securely with double-sided non-slip rug tape. Do not have throw rugs and other things on the floor that can make you trip. What can I do in the bedroom? Use night lights. Make sure that you have a light by your bed that is easy to reach. Do not use any sheets or blankets that are too big for your bed. They should not hang down onto the floor. Have a firm chair that has side arms. You can use this for support while you get dressed. Do not have throw rugs and other things on the floor that can make you trip. What can I do in the kitchen? Clean up any spills right away. Avoid walking on wet  floors. Keep items that you use a lot in easy-to-reach places. If you need to reach something above you, use a strong step stool that has a grab bar. Keep electrical cords out of the way. Do not use floor polish or wax that makes floors slippery. If you must use wax, use non-skid floor wax. Do not have throw rugs and other things on the floor that can make you trip. What can I do with my stairs? Do not leave any items on the stairs. Make sure that there are handrails on both sides of the stairs and use them. Fix handrails that are broken or loose. Make sure that handrails are as long as the stairways. Check any carpeting to make sure that it is firmly attached to the stairs. Fix any carpet that is loose or worn. Avoid having throw rugs at the top or bottom of the stairs. If you do have throw rugs, attach them to the floor with carpet tape. Make sure that you have a light switch at the top of the stairs and the bottom of the  stairs. If you do not have them, ask someone to add them for you. What else can I do to help prevent falls? Wear shoes that: Do not have high heels. Have rubber bottoms. Are comfortable and fit you well. Are closed at the toe. Do not wear sandals. If you use a stepladder: Make sure that it is fully opened. Do not climb a closed stepladder. Make sure that both sides of the stepladder are locked into place. Ask someone to hold it for you, if possible. Clearly mark and make sure that you can see: Any grab bars or handrails. First and last steps. Where the edge of each step is. Use tools that help you move around (mobility aids) if they are needed. These include: Canes. Walkers. Scooters. Crutches. Turn on the lights when you go into a dark area. Replace any light bulbs as soon as they burn out. Set up your furniture so you have a clear path. Avoid moving your furniture around. If any of your floors are uneven, fix them. If there are any pets around you, be aware of where they are. Review your medicines with your doctor. Some medicines can make you feel dizzy. This can increase your chance of falling. Ask your doctor what other things that you can do to help prevent falls. This information is not intended to replace advice given to you by your health care provider. Make sure you discuss any questions you have with your health care provider. Document Released: 08/26/2009 Document Revised: 04/06/2016 Document Reviewed: 12/04/2014 Elsevier Interactive Patient Education  2017 [employer].

## 2023-04-20 NOTE — Progress Notes (Signed)
I connected with  Danielle Schroeder on 04/20/23 by a audio enabled telemedicine application and verified that I am speaking with the correct person using two identifiers.  Patient Location: Home  Provider Location: Office/Clinic  I discussed the limitations of evaluation and management by telemedicine. The patient expressed understanding and agreed to proceed. Subjective:   Danielle Schroeder is a 67 y.o. female who presents for an Initial Medicare Annual Wellness Visit.  Review of Systems     Cardiac Risk Factors include: advanced age (>29men, >69 women);diabetes mellitus;dyslipidemia;hypertension;obesity (BMI >30kg/m2)     Objective:    Today's Vitals   04/20/23 1111  Weight: 226 lb (102.5 kg)  Height: 5\' 7"  (1.702 m)   Body mass index is 35.4 kg/m.     04/20/2023   11:19 AM 04/11/2023    8:44 AM 07/30/2020    3:01 PM 05/04/2020    8:36 AM  Advanced Directives  Does Patient Have a Medical Advance Directive? No No No No  Would patient like information on creating a medical advance directive?  Yes (Inpatient - patient defers creating a medical advance directive at this time - Information given) No - Patient declined No - Patient declined    Current Medications (verified) Outpatient Encounter Medications as of 04/20/2023  Medication Sig   aspirin EC 81 MG tablet Take 81 mg by mouth in the morning.   carvedilol (COREG) 25 MG tablet Take 25 mg by mouth 2 (two) times daily with a meal.   clopidogrel (PLAVIX) 75 MG tablet Take 1 tablet (75 mg total) by mouth daily.   furosemide (LASIX) 40 MG tablet TAKE 1 TABLET BY MOUTH TWICE A DAY   glucose blood test strip 1 each by Other route 3 (three) times daily. Use as instructed   hydrocortisone 2.5 % ointment Apply topically 2 (two) times daily. As needed for mild eczema.  Do not use for more than 1-2 weeks at a time. (Patient taking differently: Apply 1 Application topically daily.)   insulin aspart (NOVOLOG FLEXPEN) 100 UNIT/ML FlexPen  Max daily 45 units (Patient taking differently: Inject 0-6 Units into the skin 3 (three) times daily before meals. Max daily 45 units)   Insulin Glargine (BASAGLAR KWIKPEN) 100 UNIT/ML Inject 40 Units into the skin daily.   Insulin Pen Needle (PEN NEEDLES) 31G X 5 MM MISC 1 each by Does not apply route in the morning, at noon, in the evening, and at bedtime.   levothyroxine (SYNTHROID) 88 MCG tablet Take 1 tablet (88 mcg total) by mouth daily.   losartan (COZAAR) 100 MG tablet Take 100 mg by mouth every evening.   NIFEdipine (PROCARDIA-XL/NIFEDICAL-XL) 30 MG 24 hr tablet Take 30 mg by mouth every evening.   Octreotide Acetate 100 MCG/ML SOSY Inject 1 mL (100 mcg total) into the skin 3 (three) times daily.   potassium chloride (KLOR-CON) 10 MEQ tablet Take 1 tablet (10 mEq total) by mouth daily.   simvastatin (ZOCOR) 20 MG tablet Take 20 mg by mouth every evening.   SYRINGE-NEEDLE, DISP, 3 ML 25G X 1" 3 ML MISC Inject 1 ml into the skin 3 times daily.   Vitamin D, Cholecalciferol, 25 MCG (1000 UT) CAPS Take 2 capsules by mouth daily. (Patient taking differently: Take 2 capsules by mouth daily with lunch.)   cabergoline (DOSTINEX) 0.5 MG tablet Take 1 tablet (0.5 mg total) by mouth 2 (two) times a week. (Patient not taking: Reported on 04/20/2023)   No facility-administered encounter medications on file as  of 04/20/2023.    Allergies (verified) Patient has no known allergies.   History: Past Medical History:  Diagnosis Date   Cataract    bilateral lens implants   CHF (congestive heart failure) (HCC)    Diabetes mellitus without complication (HCC)    Hyperlipidemia    Hypertension    Pituitary adenoma with extrasellar extension (HCC)    Sleep apnea    no c-pap   Thyroid disease    hypothyroidism   Past Surgical History:  Procedure Laterality Date   ABDOMINAL AORTOGRAM W/LOWER EXTREMITY Left 04/11/2023   Procedure: ABDOMINAL AORTOGRAM W/LOWER EXTREMITY;  Surgeon: Victorino Sparrow, MD;   Location: [facility] INVASIVE CV LAB;  Service: Cardiovascular;  Laterality: Left;   BREAST SURGERY     CHOLECYSTECTOMY     PERIPHERAL VASCULAR BALLOON ANGIOPLASTY Left 04/11/2023   Procedure: PERIPHERAL VASCULAR BALLOON ANGIOPLASTY;  Surgeon: Victorino Sparrow, MD;  Location: [facility] INVASIVE CV LAB;  Service: Cardiovascular;  Laterality: Left;  Lt AT   PERIPHERAL VASCULAR INTERVENTION Left 04/11/2023   Procedure: PERIPHERAL VASCULAR INTERVENTION;  Surgeon: Victorino Sparrow, MD;  Location: [facility] INVASIVE CV LAB;  Service: Cardiovascular;  Laterality: Left;  Lt SFA   TONSILLECTOMY     TUBAL LIGATION     Family History  Problem Relation Age of Onset   Thyroid disease Neg Hx    Colon cancer Neg Hx    Esophageal cancer Neg Hx    Rectal cancer Neg Hx    Stomach cancer Neg Hx    Social History   Socioeconomic History   Marital status: Single    Spouse name: Not on file   Number of children: Not on file   Years of education: Not on file   Highest education level: Not on file  Occupational History   Not on file  Tobacco Use   Smoking status: Never   Smokeless tobacco: Never  Vaping Use   Vaping Use: Never used  Substance and Sexual Activity   Alcohol use: Never   Drug use: Never   Sexual activity: Not Currently  Other Topics Concern   Not on file  Social History Narrative   Not on file   Social Determinants of Health   Financial Resource Strain: Low Risk  (04/20/2023)   Overall Financial Resource Strain (CARDIA)    Difficulty of Paying Living Expenses: Not hard at all  Food Insecurity: No Food Insecurity (04/20/2023)   Hunger Vital Sign    Worried About Running Out of Food in the Last Year: Never true    Ran Out of Food in the Last Year: Never true  Transportation Needs: No Transportation Needs (04/20/2023)   PRAPARE - Administrator, Civil Service (Medical): No    Lack of Transportation (Non-Medical): No  Physical Activity: Inactive (04/20/2023)   Exercise Vital Sign    Days of  Exercise per Week: 0 days    Minutes of Exercise per Session: 0 min  Stress: No Stress Concern Present (04/20/2023)   [employer] of Occupational Health - Occupational Stress Questionnaire    Feeling of Stress : Not at all  Social Connections: Not on file    Tobacco Counseling Counseling given: Not Answered   Clinical Intake:  Pre-visit preparation completed: Yes  Pain : No/denies pain     Nutritional Status: BMI > 30  Obese Nutritional Risks: None Diabetes: Yes  How often do you need to have someone help you when you read instructions, pamphlets, or other written materials  from your doctor or pharmacy?: 1 - Never  Diabetic? Yes Nutrition Risk Assessment:  Has the patient had any N/V/D within the last 2 months?  No  Does the patient have any non-healing wounds?  No  Has the patient had any unintentional weight loss or weight gain?  No   Diabetes:  Is the patient diabetic?  Yes  If diabetic, was a CBG obtained today?  No  Did the patient bring in their glucometer from home?  No  How often do you monitor your CBG's? Twice daily.   Financial Strains and Diabetes Management:  Are you having any financial strains with the device, your supplies or your medication? No .  Does the patient want to be seen by Chronic Care Management for management of their diabetes?  No  Would the patient like to be referred to a Nutritionist or for Diabetic Management?  No   Diabetic Exams:  Diabetic Eye Exam: Completed 06/22/2022 Diabetic Foot Exam: Completed 01/16/2023  Interpreter Needed?: No  Information entered by :: NAllen LPN   Activities of Daily Living    04/20/2023   11:21 AM  In your present state of health, do you have any difficulty performing the following activities:  Hearing? 1  Vision? 0  Difficulty concentrating or making decisions? 1  Comment memory troubles sometimes  Walking or climbing stairs? 1  Dressing or bathing? 0  Doing errands, shopping? 0   Preparing Food and eating ? N  Using the Toilet? N  In the past six months, have you accidently leaked urine? N  Do you have problems with loss of bowel control? N  Managing your Medications? N  Managing your Finances? N  Housekeeping or managing your Housekeeping? N    Patient Care Team: [name], MD as PCP - General (Family Medicine) Jake Bathe, MD as PCP - Cardiology (Cardiology)  Indicate any recent Medical Services you may have received from other than [facility] providers in the past year (date may be approximate).     Assessment:   This is a routine wellness examination for Aamani.  Hearing/Vision screen Vision Screening - Comments:: Regular eye exams, Dr. Luciana Axe  Dietary issues and exercise activities discussed: Current Exercise Habits: The patient does not participate in regular exercise at present   Goals Addressed             This Visit's Progress    Patient Stated       04/20/2023, wants to lose 30 pounds       Depression Screen    04/20/2023   11:20 AM 04/16/2023    1:55 PM 04/16/2023    1:48 PM 03/05/2023   10:17 AM  PHQ 2/9 Scores  PHQ - 2 Score 0 3 3 1   PHQ- 9 Score  11 11 10   Exception Documentation   Patient refusal     Fall Risk    04/20/2023   11:20 AM 03/05/2023   10:17 AM  Fall Risk   Falls in the past year? 0 0  Number falls in past yr: 0 0  Injury with Fall? 0 0  Risk for fall due to : Medication side effect;Impaired mobility;Impaired balance/gait   Follow up Falls prevention discussed;Education provided;Falls evaluation completed     FALL RISK PREVENTION PERTAINING TO THE HOME:  Any stairs in or around the home? No  If so, are there any without handrails? N/a Home free of loose throw rugs in walkways, pet beds, electrical cords, etc? Yes  Adequate lighting in your home to reduce risk of falls? Yes   ASSISTIVE DEVICES UTILIZED TO PREVENT FALLS:  Life alert? No  Use of a cane, walker or w/c? Yes  Grab bars in the bathroom?  No  Shower chair or bench in shower? No  Elevated toilet seat or a handicapped toilet? No   TIMED UP AND GO:  Was the test performed? No .      Cognitive Function:        04/20/2023   11:23 AM  6CIT Screen  What Year? 0 points  What month? 0 points  What time? 0 points  Count back from 20 0 points  Months in reverse 2 points  Repeat phrase 0 points  Total Score 2 points    Immunizations  There is no immunization history on file for this patient.  TDAP status: Due, Education has been provided regarding the importance of this vaccine. Advised may receive this vaccine at local pharmacy or Health Dept. Aware to provide a copy of the vaccination record if obtained from local pharmacy or Health Dept. Verbalized acceptance and understanding.  Flu Vaccine status: Declined, Education has been provided regarding the importance of this vaccine but patient still declined. Advised may receive this vaccine at local pharmacy or Health Dept. Aware to provide a copy of the vaccination record if obtained from local pharmacy or Health Dept. Verbalized acceptance and understanding.  Pneumococcal vaccine status: Due, Education has been provided regarding the importance of this vaccine. Advised may receive this vaccine at local pharmacy or Health Dept. Aware to provide a copy of the vaccination record if obtained from local pharmacy or Health Dept. Verbalized acceptance and understanding.  Covid-19 vaccine status: Completed vaccines  Qualifies for Shingles Vaccine? Yes   Zostavax completed No   Shingrix Completed?: No.    Education has been provided regarding the importance of this vaccine. Patient has been advised to call insurance company to determine out of pocket expense if they have not yet received this vaccine. Advised may also receive vaccine at local pharmacy or Health Dept. Verbalized acceptance and understanding.  Screening Tests Health Maintenance  Topic Date Due   Medicare Annual  Wellness (AWV)  Never done   DTaP/Tdap/Td (1 - Tdap) Never done   PAP SMEAR-Modifier  Never done   DEXA SCAN  Never done   Colonoscopy  04/22/2023   Diabetic kidney evaluation - eGFR measurement  05/03/2023   COVID-19 Vaccine (1) 05/02/2023 (Originally 02/08/1957)   Zoster Vaccines- Shingrix (1 of 2) 06/04/2023 (Originally 08/12/2007)   Pneumonia Vaccine 24+ Years old (1 of 1 - PCV) 03/04/2024 (Originally 08/11/2022)   INFLUENZA VACCINE  06/14/2023   OPHTHALMOLOGY EXAM  06/23/2023   HEMOGLOBIN A1C  07/19/2023   FOOT EXAM  01/16/2024   Diabetic kidney evaluation - Urine ACR  04/15/2024   MAMMOGRAM  09/06/2024   Hepatitis C Screening  Completed   HIV Screening  Completed   HPV VACCINES  Aged Out    Health Maintenance  Health Maintenance Due  Topic Date Due   Medicare Annual Wellness (AWV)  Never done   DTaP/Tdap/Td (1 - Tdap) Never done   PAP SMEAR-Modifier  Never done   DEXA SCAN  Never done   Colonoscopy  04/22/2023   Diabetic kidney evaluation - eGFR measurement  05/03/2023    Colorectal cancer screening: Type of screening: Colonoscopy. Completed 04/21/2020. Repeat every 3 years  Mammogram status: Completed 09/06/2022. Repeat every year  Bone Density status: Ordered 04/16/2023. Pt  provided with contact info and advised to call to schedule appt.  Lung Cancer Screening: (Low Dose CT Chest recommended if Age 33-80 years, 30 pack-year currently smoking OR have quit w/in 15years.) does not qualify.   Lung Cancer Screening Referral: no  Additional Screening:  Hepatitis C Screening: does qualify; Completed 04/16/2023  Vision Screening: Recommended annual ophthalmology exams for early detection of glaucoma and other disorders of the eye. Is the patient up to date with their annual eye exam?  Yes  Who is the provider or what is the name of the office in which the patient attends annual eye exams? Dr. Luciana Axe If pt is not established with a provider, would they like to be referred to a  provider to establish care? No .   Dental Screening: Recommended annual dental exams for proper oral hygiene  Community Resource Referral / Chronic Care Management: CRR required this visit?  No   CCM required this visit?  No      Plan:     I have personally reviewed and noted the following in the patient's chart:   Medical and social history Use of alcohol, tobacco or illicit drugs  Current medications and supplements including opioid prescriptions. Patient is not currently taking opioid prescriptions. Functional ability and status Nutritional status Physical activity Advanced directives List of other physicians Hospitalizations, surgeries, and ER visits in previous 12 months Vitals Screenings to include cognitive, depression, and falls Referrals and appointments  In addition, I have reviewed and discussed with patient certain preventive protocols, quality metrics, and best practice recommendations. A written personalized care plan for preventive services as well as general preventive health recommendations were provided to patient.     Barb Merino, LPN   4/0/9811   Nurse Notes: none  Due to this being a virtual visit, the after visit summary with patients personalized plan was offered to patient via mail or my-chart. to pick up at office at next visit

## 2023-05-09 ENCOUNTER — Telehealth: Payer: Self-pay

## 2023-05-09 NOTE — Progress Notes (Signed)
  Chronic Care Management   Note  05/09/2023 Name: CHERISSA HOOK MRN: [id number] DOB: 29-Jan-1956  Glenice Laine Bacci is a 67 y.o. year old female who is a primary care patient of [name], MD. I reached out to [employer] by phone today in response to a referral sent by Ms. Addley G Heavin's PCP.  Ms. Demont was given information about Chronic Care Management services today including:  CCM service includes personalized support from designated clinical staff supervised by the physician, including individualized plan of care and coordination with other care providers 24/7 contact phone numbers for assistance for urgent and routine care needs. Service will only be billed when office clinical staff spend 20 minutes or more in a month to coordinate care. Only one practitioner may furnish and bill the service in a calendar month. The patient may stop CCM services at amy time (effective at the end of the month) by phone call to the office staff. The patient will be responsible for cost sharing (co-pay) or up to 20% of the service fee (after annual deductible is met)  Ms. Katalin G Bertz  agreedto scheduling an appointment with the CCM RN Case Manager   Follow up plan: Patient agreed to scheduled appointment with RN Case Manager on 05/10/2023 pharm d 05/29/2023(date/time).   [name], RMA Care Guide [facility]  [place], [place] [address] Direct Dial: [phone] [email or web address]

## 2023-05-10 ENCOUNTER — Ambulatory Visit: Payer: Medicare HMO

## 2023-05-10 ENCOUNTER — Other Ambulatory Visit: Payer: Self-pay | Admitting: *Deleted

## 2023-05-10 DIAGNOSIS — I872 Venous insufficiency (chronic) (peripheral): Secondary | ICD-10-CM

## 2023-05-10 DIAGNOSIS — I70644 Atherosclerosis of nonbiological bypass graft(s) of the left leg with ulceration of heel and midfoot: Secondary | ICD-10-CM

## 2023-05-11 ENCOUNTER — Ambulatory Visit: Payer: Self-pay

## 2023-05-12 ENCOUNTER — Emergency Department (HOSPITAL_COMMUNITY): Payer: Medicare HMO

## 2023-05-12 ENCOUNTER — Other Ambulatory Visit: Payer: Self-pay

## 2023-05-12 ENCOUNTER — Inpatient Hospital Stay (HOSPITAL_COMMUNITY)
Admission: EM | Admit: 2023-05-12 | Discharge: 2023-05-21 | DRG: 854 | Disposition: A | Discharge status: Home Health | Payer: Medicare HMO | Attending: Internal Medicine | Admitting: Internal Medicine

## 2023-05-12 DIAGNOSIS — L97519 Non-pressure chronic ulcer of other part of right foot with unspecified severity: Secondary | ICD-10-CM | POA: Diagnosis present

## 2023-05-12 DIAGNOSIS — I130 Hypertensive heart and chronic kidney disease with heart failure and stage 1 through stage 4 chronic kidney disease, or unspecified chronic kidney disease: Secondary | ICD-10-CM | POA: Diagnosis not present

## 2023-05-12 DIAGNOSIS — L03031 Cellulitis of right toe: Secondary | ICD-10-CM | POA: Diagnosis not present

## 2023-05-12 DIAGNOSIS — E669 Obesity, unspecified: Secondary | ICD-10-CM | POA: Diagnosis present

## 2023-05-12 DIAGNOSIS — G4730 Sleep apnea, unspecified: Secondary | ICD-10-CM | POA: Diagnosis present

## 2023-05-12 DIAGNOSIS — E039 Hypothyroidism, unspecified: Secondary | ICD-10-CM | POA: Diagnosis present

## 2023-05-12 DIAGNOSIS — E782 Mixed hyperlipidemia: Secondary | ICD-10-CM | POA: Diagnosis not present

## 2023-05-12 DIAGNOSIS — A419 Sepsis, unspecified organism: Secondary | ICD-10-CM | POA: Diagnosis not present

## 2023-05-12 DIAGNOSIS — E220 Acromegaly and pituitary gigantism: Secondary | ICD-10-CM | POA: Diagnosis present

## 2023-05-12 DIAGNOSIS — S91101A Unspecified open wound of right great toe without damage to nail, initial encounter: Secondary | ICD-10-CM | POA: Diagnosis not present

## 2023-05-12 DIAGNOSIS — I96 Gangrene, not elsewhere classified: Secondary | ICD-10-CM | POA: Diagnosis not present

## 2023-05-12 DIAGNOSIS — E1142 Type 2 diabetes mellitus with diabetic polyneuropathy: Secondary | ICD-10-CM | POA: Diagnosis not present

## 2023-05-12 DIAGNOSIS — M7989 Other specified soft tissue disorders: Secondary | ICD-10-CM | POA: Diagnosis not present

## 2023-05-12 DIAGNOSIS — L89622 Pressure ulcer of left heel, stage 2: Secondary | ICD-10-CM | POA: Diagnosis not present

## 2023-05-12 DIAGNOSIS — N1832 Chronic kidney disease, stage 3b: Secondary | ICD-10-CM | POA: Diagnosis present

## 2023-05-12 DIAGNOSIS — E11628 Type 2 diabetes mellitus with other skin complications: Secondary | ICD-10-CM | POA: Diagnosis present

## 2023-05-12 DIAGNOSIS — Z7902 Long term (current) use of antithrombotics/antiplatelets: Secondary | ICD-10-CM

## 2023-05-12 DIAGNOSIS — L89612 Pressure ulcer of right heel, stage 2: Secondary | ICD-10-CM | POA: Diagnosis not present

## 2023-05-12 DIAGNOSIS — M868X7 Other osteomyelitis, ankle and foot: Secondary | ICD-10-CM | POA: Diagnosis present

## 2023-05-12 DIAGNOSIS — E1151 Type 2 diabetes mellitus with diabetic peripheral angiopathy without gangrene: Secondary | ICD-10-CM | POA: Diagnosis not present

## 2023-05-12 DIAGNOSIS — Z862 Personal history of diseases of the blood and blood-forming organs and certain disorders involving the immune mechanism: Secondary | ICD-10-CM | POA: Diagnosis not present

## 2023-05-12 DIAGNOSIS — Z7982 Long term (current) use of aspirin: Secondary | ICD-10-CM

## 2023-05-12 DIAGNOSIS — E11621 Type 2 diabetes mellitus with foot ulcer: Secondary | ICD-10-CM | POA: Diagnosis present

## 2023-05-12 DIAGNOSIS — E1165 Type 2 diabetes mellitus with hyperglycemia: Secondary | ICD-10-CM | POA: Diagnosis not present

## 2023-05-12 DIAGNOSIS — E1169 Type 2 diabetes mellitus with other specified complication: Secondary | ICD-10-CM | POA: Diagnosis not present

## 2023-05-12 DIAGNOSIS — E785 Hyperlipidemia, unspecified: Secondary | ICD-10-CM | POA: Diagnosis present

## 2023-05-12 DIAGNOSIS — E119 Type 2 diabetes mellitus without complications: Secondary | ICD-10-CM

## 2023-05-12 DIAGNOSIS — N189 Chronic kidney disease, unspecified: Secondary | ICD-10-CM | POA: Diagnosis not present

## 2023-05-12 DIAGNOSIS — I70261 Atherosclerosis of native arteries of extremities with gangrene, right leg: Secondary | ICD-10-CM | POA: Diagnosis present

## 2023-05-12 DIAGNOSIS — E1152 Type 2 diabetes mellitus with diabetic peripheral angiopathy with gangrene: Secondary | ICD-10-CM | POA: Diagnosis not present

## 2023-05-12 DIAGNOSIS — D631 Anemia in chronic kidney disease: Secondary | ICD-10-CM | POA: Diagnosis not present

## 2023-05-12 DIAGNOSIS — M869 Osteomyelitis, unspecified: Secondary | ICD-10-CM | POA: Diagnosis not present

## 2023-05-12 DIAGNOSIS — I70235 Atherosclerosis of native arteries of right leg with ulceration of other part of foot: Secondary | ICD-10-CM | POA: Diagnosis not present

## 2023-05-12 DIAGNOSIS — R531 Weakness: Secondary | ICD-10-CM | POA: Diagnosis not present

## 2023-05-12 DIAGNOSIS — I10 Essential (primary) hypertension: Secondary | ICD-10-CM | POA: Diagnosis present

## 2023-05-12 DIAGNOSIS — Z79899 Other long term (current) drug therapy: Secondary | ICD-10-CM

## 2023-05-12 DIAGNOSIS — R000 Tachycardia, unspecified: Secondary | ICD-10-CM | POA: Diagnosis not present

## 2023-05-12 DIAGNOSIS — E1122 Type 2 diabetes mellitus with diabetic chronic kidney disease: Secondary | ICD-10-CM | POA: Diagnosis present

## 2023-05-12 DIAGNOSIS — E11319 Type 2 diabetes mellitus with unspecified diabetic retinopathy without macular edema: Secondary | ICD-10-CM | POA: Diagnosis not present

## 2023-05-12 DIAGNOSIS — R609 Edema, unspecified: Secondary | ICD-10-CM | POA: Diagnosis not present

## 2023-05-12 DIAGNOSIS — Z6835 Body mass index (BMI) 35.0-35.9, adult: Secondary | ICD-10-CM

## 2023-05-12 DIAGNOSIS — I5032 Chronic diastolic (congestive) heart failure: Secondary | ICD-10-CM | POA: Diagnosis present

## 2023-05-12 DIAGNOSIS — E860 Dehydration: Secondary | ICD-10-CM | POA: Diagnosis present

## 2023-05-12 DIAGNOSIS — M19071 Primary osteoarthritis, right ankle and foot: Secondary | ICD-10-CM | POA: Diagnosis not present

## 2023-05-12 DIAGNOSIS — Z794 Long term (current) use of insulin: Secondary | ICD-10-CM | POA: Diagnosis not present

## 2023-05-12 DIAGNOSIS — Z95828 Presence of other vascular implants and grafts: Secondary | ICD-10-CM

## 2023-05-12 DIAGNOSIS — Z79890 Hormone replacement therapy (postmenopausal): Secondary | ICD-10-CM

## 2023-05-12 DIAGNOSIS — R739 Hyperglycemia, unspecified: Secondary | ICD-10-CM | POA: Diagnosis not present

## 2023-05-12 HISTORY — DX: Osteomyelitis, unspecified: M86.9

## 2023-05-12 LAB — CBG MONITORING, ED
Glucose-Capillary: 429 mg/dL — ABNORMAL HIGH (ref 70–99)
Glucose-Capillary: 380 mg/dL — ABNORMAL HIGH (ref 70–99)

## 2023-05-12 LAB — CBC
HCT: 31.1 % — ABNORMAL LOW (ref 36.0–46.0)
Hemoglobin: 9.8 g/dL — ABNORMAL LOW (ref 12.0–15.0)
MCH: 27.1 pg (ref 26.0–34.0)
MCHC: 31.5 g/dL (ref 30.0–36.0)
MCV: 86.1 fL (ref 80.0–100.0)
Platelets: 231 10*3/uL (ref 150–400)
RBC: 3.61 MIL/uL — ABNORMAL LOW (ref 3.87–5.11)
RDW: 14.9 % (ref 11.5–15.5)
WBC: 12.3 10*3/uL — ABNORMAL HIGH (ref 4.0–10.5)
nRBC: 0 % (ref 0.0–0.2)

## 2023-05-12 LAB — BASIC METABOLIC PANEL
Anion gap: 12 (ref 5–15)
BUN: 16 mg/dL (ref 8–23)
CO2: 23 mmol/L (ref 22–32)
Calcium: 8.6 mg/dL — ABNORMAL LOW (ref 8.9–10.3)
Chloride: 94 mmol/L — ABNORMAL LOW (ref 98–111)
Creatinine, Ser: 1.77 mg/dL — ABNORMAL HIGH (ref 0.44–1.00)
GFR, Estimated: 32 mL/min — ABNORMAL LOW (ref >60)
Glucose, Bld: 482 mg/dL — ABNORMAL HIGH (ref 70–99)
Potassium: 3.9 mmol/L (ref 3.5–5.1)
Sodium: 129 mmol/L — ABNORMAL LOW (ref 135–145)

## 2023-05-12 LAB — SEDIMENTATION RATE: Sed Rate: 61 mm/hr — ABNORMAL HIGH (ref 0–22)

## 2023-05-12 LAB — BRAIN NATRIURETIC PEPTIDE: B Natriuretic Peptide: 214.7 pg/mL — ABNORMAL HIGH (ref 0.0–100.0)

## 2023-05-12 LAB — LACTIC ACID, PLASMA: Lactic Acid, Venous: 1.8 mmol/L (ref 0.5–1.9)

## 2023-05-12 MED ORDER — INSULIN ASPART 100 UNIT/ML IJ SOLN
0.0000 [IU] | Freq: Three times a day (TID) | INTRAMUSCULAR | Status: DC
  Administered 2023-05-13: 11 [IU] via SUBCUTANEOUS
  Administered 2023-05-13 (×2): 8 [IU] via SUBCUTANEOUS
  Administered 2023-05-14 (×2): 5 [IU] via SUBCUTANEOUS
  Administered 2023-05-14: 11 [IU] via SUBCUTANEOUS
  Administered 2023-05-15: 5 [IU] via SUBCUTANEOUS
  Administered 2023-05-15 (×2): 11 [IU] via SUBCUTANEOUS
  Administered 2023-05-16: 8 [IU] via SUBCUTANEOUS
  Administered 2023-05-16: 11 [IU] via SUBCUTANEOUS
  Administered 2023-05-16: 3 [IU] via SUBCUTANEOUS
  Administered 2023-05-17: 11 [IU] via SUBCUTANEOUS
  Administered 2023-05-17 (×2): 8 [IU] via SUBCUTANEOUS
  Administered 2023-05-18: 3 [IU] via SUBCUTANEOUS
  Administered 2023-05-18 – 2023-05-19 (×4): 5 [IU] via SUBCUTANEOUS
  Administered 2023-05-20: 2 [IU] via SUBCUTANEOUS
  Administered 2023-05-20: 5 [IU] via SUBCUTANEOUS
  Administered 2023-05-20 – 2023-05-21 (×3): 3 [IU] via SUBCUTANEOUS
  Filled 2023-05-12: qty 0.15

## 2023-05-12 MED ORDER — FENTANYL CITRATE PF 50 MCG/ML IJ SOSY: 25.0000 ug | PREFILLED_SYRINGE | INTRAMUSCULAR | Status: DC | PRN

## 2023-05-12 MED ORDER — LACTATED RINGERS IV SOLN: INTRAVENOUS | Status: DC

## 2023-05-12 MED ORDER — VANCOMYCIN HCL 2000 MG/400ML IV SOLN
2000.0000 mg | Freq: Once | INTRAVENOUS | Status: AC
  Administered 2023-05-13: 2000 mg via INTRAVENOUS
  Filled 2023-05-12: qty 400

## 2023-05-12 MED ORDER — INSULIN ASPART 100 UNIT/ML IJ SOLN
0.0000 [IU] | Freq: Every day | INTRAMUSCULAR | Status: DC
  Administered 2023-05-13: 2 [IU] via SUBCUTANEOUS
  Administered 2023-05-13: 4 [IU] via SUBCUTANEOUS
  Administered 2023-05-14 – 2023-05-16 (×3): 3 [IU] via SUBCUTANEOUS
  Administered 2023-05-17 – 2023-05-19 (×3): 2 [IU] via SUBCUTANEOUS
  Filled 2023-05-12: qty 0.05

## 2023-05-12 MED ORDER — ACETAMINOPHEN 325 MG PO TABS: 650.0000 mg | ORAL_TABLET | Freq: Four times a day (QID) | ORAL | Status: DC | PRN

## 2023-05-12 MED ORDER — METRONIDAZOLE 500 MG/100ML IV SOLN
500.0000 mg | Freq: Two times a day (BID) | INTRAVENOUS | Status: DC
  Administered 2023-05-13 – 2023-05-19 (×14): 500 mg via INTRAVENOUS
  Filled 2023-05-12 (×14): qty 100

## 2023-05-12 MED ORDER — NALOXONE HCL 0.4 MG/ML IJ SOLN: 0.4000 mg | INTRAMUSCULAR | Status: DC | PRN

## 2023-05-12 MED ORDER — ACETAMINOPHEN 650 MG RE SUPP: 650.0000 mg | Freq: Four times a day (QID) | RECTAL | Status: DC | PRN

## 2023-05-12 MED ORDER — MELATONIN 3 MG PO TABS: 3.0000 mg | ORAL_TABLET | Freq: Every evening | ORAL | Status: DC | PRN

## 2023-05-12 MED ORDER — SODIUM CHLORIDE 0.9 % IV SOLN
2.0000 g | Freq: Two times a day (BID) | INTRAVENOUS | Status: DC
  Administered 2023-05-13 – 2023-05-19 (×13): 2 g via INTRAVENOUS
  Filled 2023-05-12 (×13): qty 12.5

## 2023-05-12 MED ORDER — ONDANSETRON HCL 4 MG/2ML IJ SOLN: 4.0000 mg | Freq: Four times a day (QID) | INTRAMUSCULAR | Status: DC | PRN

## 2023-05-12 MED ORDER — SODIUM CHLORIDE 0.9 % IV SOLN
2.0000 g | Freq: Once | INTRAVENOUS | Status: AC
  Administered 2023-05-12: 2 g via INTRAVENOUS
  Filled 2023-05-12: qty 12.5

## 2023-05-12 NOTE — ED Triage Notes (Signed)
Pt BIBA from home. C/o hyperglycemia (485), and vaginal itching.  Given 500 mL NS  BP: 166/72 HR: 102 SPO2: 94 on 2L [place]

## 2023-05-12 NOTE — ED Provider Notes (Signed)
[place] EMERGENCY DEPARTMENT AT [facility] Provider Note   CSN: [id number] Arrival date & time: 05/12/23  1842     History {Add pertinent medical, surgical, social history, OB history to HPI:1} Chief Complaint  Patient presents with   Hyperglycemia   Vaginal Itching    Danielle Schroeder is a 67 y.o. female.   Hyperglycemia Vaginal Itching       Home Medications Prior to Admission medications   Medication Sig Start Date End Date Taking? Authorizing Provider  aspirin EC 81 MG tablet Take 81 mg by mouth in the morning.    [provider]  cabergoline (DOSTINEX) 0.5 MG tablet Take 1 tablet (0.5 mg total) by mouth 2 (two) times a week. Patient not taking: Reported on 04/20/2023 01/18/23   Shamleffer, Konrad Dolores, MD  carvedilol (COREG) 25 MG tablet Take 25 mg by mouth 2 (two) times daily with a meal.    [provider]  clopidogrel (PLAVIX) 75 MG tablet Take 1 tablet (75 mg total) by mouth daily. 04/11/23 04/10/24  Victorino Sparrow, MD  furosemide (LASIX) 40 MG tablet TAKE 1 TABLET BY MOUTH TWICE A DAY 07/21/20   Jake Bathe, MD  glucose blood test strip 1 each by Other route 3 (three) times daily. Use as instructed    [provider]  hydrocortisone 2.5 % ointment Apply topically 2 (two) times daily. As needed for mild eczema.  Do not use for more than 1-2 weeks at a time. Patient taking differently: Apply 1 Application topically daily. 03/05/23   [name], MD  insulin aspart (NOVOLOG FLEXPEN) 100 UNIT/ML FlexPen Max daily 45 units Patient taking differently: Inject 0-6 Units into the skin 3 (three) times daily before meals. Max daily 45 units 01/16/23   Shamleffer, Konrad Dolores, MD  Insulin Glargine (BASAGLAR KWIKPEN) 100 UNIT/ML Inject 40 Units into the skin daily. 01/16/23   Shamleffer, Konrad Dolores, MD  Insulin Pen Needle (PEN NEEDLES) 31G X 5 MM MISC 1 each by Does not apply route in the morning, at noon, in the evening, and  at bedtime. 01/16/23   Shamleffer, Konrad Dolores, MD  levothyroxine (SYNTHROID) 88 MCG tablet Take 1 tablet (88 mcg total) by mouth daily. 01/16/23   Shamleffer, Konrad Dolores, MD  losartan (COZAAR) 100 MG tablet Take 100 mg by mouth every evening.    [provider]  NIFEdipine (PROCARDIA-XL/NIFEDICAL-XL) 30 MG 24 hr tablet Take 30 mg by mouth every evening.    [provider]  Octreotide Acetate 100 MCG/ML SOSY Inject 1 mL (100 mcg total) into the skin 3 (three) times daily. 07/19/22   Shamleffer, Konrad Dolores, MD  potassium chloride (KLOR-CON) 10 MEQ tablet Take 1 tablet (10 mEq total) by mouth daily. 04/16/23 04/10/24  [name], MD  simvastatin (ZOCOR) 20 MG tablet Take 20 mg by mouth every evening.    [provider]  SYRINGE-NEEDLE, DISP, 3 ML 25G X 1" 3 ML MISC Inject 1 ml into the skin 3 times daily. 12/02/21   Romero Belling, MD  Vitamin D, Cholecalciferol, 25 MCG (1000 UT) CAPS Take 2 capsules by mouth daily. Patient taking differently: Take 2 capsules by mouth daily with lunch. 07/12/20   Romero Belling, MD      Allergies    Patient has no known allergies.    Review of Systems   Review of Systems  Physical Exam Updated Vital Signs BP (!) 148/82   Pulse (!) 105   Temp (!)  100.9 F (38.3 C) (Rectal)   Resp 18   Ht 5\' 7"  (1.702 m)   Wt 102 kg   SpO2 96%   BMI 35.22 kg/m  Physical Exam  ED Results / Procedures / Treatments   Labs (all labs ordered are listed, but only abnormal results are displayed) Labs Reviewed  BASIC METABOLIC PANEL - Abnormal; Notable for the following components:      Result Value   Sodium 129 (*)    Chloride 94 (*)    Glucose, Bld 482 (*)    Creatinine, Ser 1.77 (*)    Calcium 8.6 (*)    GFR, Estimated 32 (*)    All other components within normal limits  CBC - Abnormal; Notable for the following components:   WBC 12.3 (*)    RBC 3.61 (*)    Hemoglobin 9.8 (*)    HCT 31.1 (*)    All other components within  normal limits  BRAIN NATRIURETIC PEPTIDE - Abnormal; Notable for the following components:   B Natriuretic Peptide 214.7 (*)    All other components within normal limits  CBG MONITORING, ED - Abnormal; Notable for the following components:   Glucose-Capillary 429 (*)    All other components within normal limits  CULTURE, BLOOD (ROUTINE X 2)  CULTURE, BLOOD (ROUTINE X 2)  WET PREP, GENITAL  LACTIC ACID, PLASMA  URINALYSIS, ROUTINE W REFLEX MICROSCOPIC  LACTIC ACID, PLASMA  SEDIMENTATION RATE  C-REACTIVE PROTEIN    EKG None  Radiology DG Foot Complete Right  Result Date: 05/12/2023 CLINICAL DATA:  Great toe wound, concern for osteomyelitis EXAM: RIGHT FOOT COMPLETE - 3+ VIEW COMPARISON:  None Available. FINDINGS: No fracture or dislocation of the right foot. Mild arthrosis. Bony erosion of the tuft of the right great toe. Overlying soft tissue wound. Diffuse soft tissue edema about the foot and ankle. IMPRESSION: 1. Bony erosion of the tuft of the right great toe, concerning for osteomyelitis. Overlying soft tissue wound. 2. No fracture or dislocation of the right foot. 3. Diffuse soft tissue edema about the foot and ankle. Electronically Signed   By: Jearld Lesch M.D.   On: 05/12/2023 21:10    Procedures Procedures  {Document cardiac monitor, telemetry assessment procedure when appropriate:1}  Medications Ordered in ED Medications - No data to display  ED Course/ Medical Decision Making/ A&P Clinical Course as of 05/12/23 2132  Sat May 12, 2023  2021 Past medical history significant for pituitary adenoma, acromegaly, chronic venous insufficiency bilateral lower extremities, as well as arterial insufficiency, diabetes, diabetic retinopathy who presents with concern for elevated blood sugar, vaginal itching, patient with notable wound to her right great toe on arrival.  She reports that it is maybe been present for about a week, but she cannot feel her feet so she is unsure.  She was  being evaluated for lower extremity wounds secondary to her vascular compromise in may and no wound on right great toe was remarked on at that time. [CP]    Clinical Course User Index [CP] Zeyad Delaguila H, PA-C   {   Click here for ABCD2, HEART and other calculatorsREFRESH Note before signing :1}                          Medical Decision Making Amount and/or Complexity of Data Reviewed Labs: ordered. Radiology: ordered.   ***  {Document critical care time when appropriate:1} {Document review of labs and clinical decision tools ie  heart score, Chads2Vasc2 etc:1}  {Document your independent review of radiology images, and any outside records:1} {Document your discussion with family members, caretakers, and with consultants:1} {Document social determinants of health affecting pt's care:1} {Document your decision making why or why not admission, treatments were needed:1} Final Clinical Impression(s) / ED Diagnoses Final diagnoses:  None    Rx / DC Orders ED Discharge Orders     None

## 2023-05-12 NOTE — Sepsis Progress Note (Signed)
Elink following for sepsis protocol. 

## 2023-05-12 NOTE — H&P (Signed)
History and Physical      Danielle Schroeder WGN:[id number] DOB: 11-18-1955 DOA: 05/12/2023; DOS: 05/12/2023  PCP: [name], MD *** Patient coming from: home ***  I have personally briefly reviewed patient's old medical records in [facility]  Chief Complaint: ***  HPI: Danielle Schroeder is a 67 y.o. female with medical history significant for *** who is admitted to [facility] on 05/12/2023 with *** after presenting from home*** to [facility] ED complaining of ***.   ***        ***  ED Course:  Vital signs in the ED were notable for the following: ***  Labs were notable for the following: ***  Per my interpretation, EKG in ED demonstrated the following:  ***  Imaging and additional notable ED work-up: ***  While in the ED, the following were administered: ***  Subsequently, the patient was admitted  ***  ***red   Review of Systems: As per HPI otherwise 10 point review of systems negative.   Past Medical History:  Diagnosis Date   Cataract    bilateral lens implants   CHF (congestive heart failure) (HCC)    Diabetes mellitus without complication (HCC)    Hyperlipidemia    Hypertension    Pituitary adenoma with extrasellar extension (HCC)    Sleep apnea    no c-pap   Thyroid disease    hypothyroidism    Past Surgical History:  Procedure Laterality Date   ABDOMINAL AORTOGRAM W/LOWER EXTREMITY Left 04/11/2023   Procedure: ABDOMINAL AORTOGRAM W/LOWER EXTREMITY;  Surgeon: Victorino Sparrow, MD;  Location: [facility] INVASIVE CV LAB;  Service: Cardiovascular;  Laterality: Left;   BREAST SURGERY     CHOLECYSTECTOMY     PERIPHERAL VASCULAR BALLOON ANGIOPLASTY Left 04/11/2023   Procedure: PERIPHERAL VASCULAR BALLOON ANGIOPLASTY;  Surgeon: Victorino Sparrow, MD;  Location: [facility] INVASIVE CV LAB;  Service: Cardiovascular;  Laterality: Left;  Lt AT   PERIPHERAL VASCULAR INTERVENTION Left 04/11/2023   Procedure: PERIPHERAL VASCULAR INTERVENTION;  Surgeon: Victorino Sparrow, MD;  Location: [facility] INVASIVE CV LAB;  Service: Cardiovascular;  Laterality: Left;  Lt SFA   TONSILLECTOMY     TUBAL LIGATION      Social History:  reports that she has never smoked. She has never used smokeless tobacco. She reports that she does not drink alcohol and does not use drugs.   No Known Allergies  Family History  Problem Relation Age of Onset   Thyroid disease Neg Hx    Colon cancer Neg Hx    Esophageal cancer Neg Hx    Rectal cancer Neg Hx    Stomach cancer Neg Hx     Family history reviewed and not pertinent ***   Prior to Admission medications   Medication Sig Start Date End Date Taking? Authorizing Provider  aspirin EC 81 MG tablet Take 81 mg by mouth in the morning.   Yes [provider]  carvedilol (COREG) 25 MG tablet Take 25 mg by mouth 2 (two) times daily with a meal.   Yes [provider]  furosemide (LASIX) 40 MG tablet TAKE 1 TABLET BY MOUTH TWICE A DAY 07/21/20  Yes Jake Bathe, MD  hydrocortisone 2.5 % ointment Apply topically 2 (two) times daily. As needed for mild eczema.  Do not use for more than 1-2 weeks at a time. Patient taking differently: Apply 1 Application topically daily. 03/05/23  Yes [name], MD  insulin aspart (NOVOLOG FLEXPEN) 100 UNIT/ML FlexPen  Max daily 45 units Patient taking differently: Inject 0-6 Units into the skin 3 (three) times daily before meals. Max daily 45 units 01/16/23  Yes Shamleffer, Konrad Dolores, MD  Insulin Glargine (BASAGLAR KWIKPEN) 100 UNIT/ML Inject 40 Units into the skin daily. 01/16/23  Yes Shamleffer, Konrad Dolores, MD  levothyroxine (SYNTHROID) 88 MCG tablet Take 1 tablet (88 mcg total) by mouth daily. 01/16/23  Yes Shamleffer, Konrad Dolores, MD  losartan (COZAAR) 100 MG tablet Take 100 mg by mouth every evening.   Yes [provider]  NIFEdipine (PROCARDIA-XL/NIFEDICAL-XL) 30 MG 24 hr tablet Take 30 mg by mouth every evening.   Yes [provider]   potassium chloride (KLOR-CON) 10 MEQ tablet Take 1 tablet (10 mEq total) by mouth daily. 04/16/23 04/10/24 Yes [name], MD  simvastatin (ZOCOR) 20 MG tablet Take 20 mg by mouth every evening.   Yes [provider]  Vitamin D, Cholecalciferol, 25 MCG (1000 UT) CAPS Take 2 capsules by mouth daily. Patient taking differently: Take 2 capsules by mouth daily with lunch. 07/12/20  Yes Romero Belling, MD  cabergoline (DOSTINEX) 0.5 MG tablet Take 1 tablet (0.5 mg total) by mouth 2 (two) times a week. Patient not taking: Reported on 04/20/2023 01/18/23   Shamleffer, Konrad Dolores, MD  clopidogrel (PLAVIX) 75 MG tablet Take 1 tablet (75 mg total) by mouth daily. Patient not taking: Reported on 05/12/2023 04/11/23 04/10/24  Victorino Sparrow, MD  glucose blood test strip 1 each by Other route 3 (three) times daily. Use as instructed    [provider]  Insulin Pen Needle (PEN NEEDLES) 31G X 5 MM MISC 1 each by Does not apply route in the morning, at noon, in the evening, and at bedtime. 01/16/23   Shamleffer, Konrad Dolores, MD  Octreotide Acetate 100 MCG/ML SOSY Inject 1 mL (100 mcg total) into the skin 3 (three) times daily. Patient not taking: Reported on 05/12/2023 07/19/22   Shamleffer, Konrad Dolores, MD  SYRINGE-NEEDLE, DISP, 3 ML 25G X 1" 3 ML MISC Inject 1 ml into the skin 3 times daily. 12/02/21   Romero Belling, MD     Objective    Physical Exam: Vitals:   05/12/23 2000 05/12/23 2035 05/12/23 2135 05/12/23 2230  BP: (!) 148/82  (!) 156/85 (!) 163/78  Pulse: (!) 105  99 95  Resp: 18  20 18   Temp: 99.6 F (37.6 C) (!) 100.9 F (38.3 C)    TempSrc: Oral Rectal    SpO2: 96%  95% 94%  Weight:      Height:        General: appears to be stated age; alert, oriented Skin: warm, dry, no rash Head:  AT/[place] Mouth:  Oral mucosa membranes appear moist, normal dentition Neck: supple; trachea midline Heart:  RRR; did not appreciate any M/R/G Lungs: CTAB, did not appreciate any  wheezes, rales, or rhonchi Abdomen: + BS; soft, ND, NT Vascular: 2+ pedal pulses b/l; 2+ radial pulses b/l Extremities: no peripheral edema, no muscle wasting Neuro: strength and sensation intact in upper and lower extremities b/l    *** Neuro: 5/5 strength of the proximal and distal flexors and extensors of the upper and lower extremities bilaterally; sensation intact in upper and lower extremities b/l; cranial nerves II through XII grossly intact; no pronator drift; no evidence suggestive of slurred speech, dysarthria, or facial droop; Normal muscle tone. No tremors. *** Neuro: In the setting of the patient's current mental status and associated inability to follow  instructions, unable to perform full neurologic exam at this time.  As such, assessment of strength, sensation, and cranial nerves is limited at this time. Patient noted to spontaneously move all 4 extremities. No tremors.  ***    Labs on Admission: I have personally reviewed following labs and imaging studies  CBC: Recent Labs  Lab 05/12/23 1920  WBC 12.3*  HGB 9.8*  HCT 31.1*  MCV 86.1  PLT 231   Basic Metabolic Panel: Recent Labs  Lab 05/12/23 1920  NA 129*  K 3.9  CL 94*  CO2 23  GLUCOSE 482*  BUN 16  CREATININE 1.77*  CALCIUM 8.6*   GFR: Estimated Creatinine Clearance: 38.9 mL/min (A) (by C-G formula based on SCr of 1.77 mg/dL (H)). Liver Function Tests: No results for input(s): "AST", "ALT", "ALKPHOS", "BILITOT", "PROT", "ALBUMIN" in the last 168 hours. No results for input(s): "LIPASE", "AMYLASE" in the last 168 hours. No results for input(s): "AMMONIA" in the last 168 hours. Coagulation Profile: No results for input(s): "INR", "PROTIME" in the last 168 hours. Cardiac Enzymes: No results for input(s): "CKTOTAL", "CKMB", "CKMBINDEX", "TROPONINI" in the last 168 hours. BNP (last 3 results) No results for input(s): "PROBNP" in the last 8760 hours. HbA1C: No results for input(s): "HGBA1C" in the  last 72 hours. CBG: Recent Labs  Lab 05/12/23 1955  GLUCAP 429*   Lipid Profile: No results for input(s): "CHOL", "HDL", "LDLCALC", "TRIG", "CHOLHDL", "LDLDIRECT" in the last 72 hours. Thyroid Function Tests: No results for input(s): "TSH", "T4TOTAL", "FREET4", "T3FREE", "THYROIDAB" in the last 72 hours. Anemia Panel: No results for input(s): "VITAMINB12", "FOLATE", "FERRITIN", "TIBC", "IRON", "RETICCTPCT" in the last 72 hours. Urine analysis:    Component Value Date/Time   COLORURINE YELLOW 09/07/2020 0831   APPEARANCEUR CLEAR 09/07/2020 0831   LABSPEC 1.015 09/07/2020 0831   PHURINE 5.5 09/07/2020 0831   GLUCOSEU 100 (A) 09/07/2020 0831   HGBUR NEGATIVE 09/07/2020 0831   BILIRUBINUR NEGATIVE 09/07/2020 0831   KETONESUR NEGATIVE 09/07/2020 0831   UROBILINOGEN 0.2 09/07/2020 0831   NITRITE NEGATIVE 09/07/2020 0831   LEUKOCYTESUR TRACE (A) 09/07/2020 0831    Radiological Exams on Admission: DG Foot Complete Right  Result Date: 05/12/2023 CLINICAL DATA:  Great toe wound, concern for osteomyelitis EXAM: RIGHT FOOT COMPLETE - 3+ VIEW COMPARISON:  None Available. FINDINGS: No fracture or dislocation of the right foot. Mild arthrosis. Bony erosion of the tuft of the right great toe. Overlying soft tissue wound. Diffuse soft tissue edema about the foot and ankle. IMPRESSION: 1. Bony erosion of the tuft of the right great toe, concerning for osteomyelitis. Overlying soft tissue wound. 2. No fracture or dislocation of the right foot. 3. Diffuse soft tissue edema about the foot and ankle. Electronically Signed   By: Jearld Lesch M.D.   On: 05/12/2023 21:10      Assessment/Plan    Principal Problem:   Osteomyelitis of right lower extremity  (HCC)  ***      ***          ***               ***              ***              ***              ***              ***              ***              ***              ***              ***              ***              ***  DVT prophylaxis: SCD's ***  Code Status: Full code*** Family Communication: none*** Disposition Plan: Per Rounding Team Consults called: none***;  Admission status: ***    I SPENT GREATER THAN 75 *** MINUTES IN CLINICAL CARE TIME/MEDICAL DECISION-MAKING IN COMPLETING THIS ADMISSION.     Chaney Born Naje Rice DO [facility] From 7PM - 7AM   05/12/2023, 10:48 PM   ***

## 2023-05-12 NOTE — ED Provider Notes (Incomplete)
[place] EMERGENCY DEPARTMENT AT [facility]: [facility] Provider Note   CSN: [id number] Arrival date & time: 05/12/23  1842     History {Add pertinent medical, surgical, social history, OB history to HPI:1} Chief Complaint  Patient presents with  . Hyperglycemia  . Vaginal Itching    Danielle Schroeder is a 67 y.o. femalePast medical history significant for pituitary adenoma, acromegaly, chronic venous insufficiency bilateral lower extremities, as well as arterial insufficiency, diabetes, diabetic retinopathy who presents with concern for elevated blood sugar, vaginal itching, patient with notable wound to her right great toe on arrival.  She reports that it is maybe been present for about a week, but she cannot feel her feet so she is unsure.  She was being evaluated for lower extremity wounds secondary to her vascular compromise in may and no wound on right great toe was remarked on at that time.  Notably with recent revascularization secondary to critical limb ischemia.   Hyperglycemia Vaginal Itching       Home Medications Prior to Admission medications   Medication Sig Start Date End Date Taking? Authorizing Provider  aspirin EC 81 MG tablet Take 81 mg by mouth in the morning.    [provider]  cabergoline (DOSTINEX) 0.5 MG tablet Take 1 tablet (0.5 mg total) by mouth 2 (two) times a week. Patient not taking: Reported on 04/20/2023 01/18/23   Shamleffer, Konrad Dolores, MD  carvedilol (COREG) 25 MG tablet Take 25 mg by mouth 2 (two) times daily with a meal.    [provider]  clopidogrel (PLAVIX) 75 MG tablet Take 1 tablet (75 mg total) by mouth daily. 04/11/23 04/10/24  Victorino Sparrow, MD  furosemide (LASIX) 40 MG tablet TAKE 1 TABLET BY MOUTH TWICE A DAY 07/21/20   Jake Bathe, MD  glucose blood test strip 1 each by Other route 3 (three) times daily. Use as instructed    [provider]  hydrocortisone 2.5 % ointment Apply topically 2 (two)  times daily. As needed for mild eczema.  Do not use for more than 1-2 weeks at a time. Patient taking differently: Apply 1 Application topically daily. 03/05/23   [name], MD  insulin aspart (NOVOLOG FLEXPEN) 100 UNIT/ML FlexPen Max daily 45 units Patient taking differently: Inject 0-6 Units into the skin 3 (three) times daily before meals. Max daily 45 units 01/16/23   Shamleffer, Konrad Dolores, MD  Insulin Glargine (BASAGLAR KWIKPEN) 100 UNIT/ML Inject 40 Units into the skin daily. 01/16/23   Shamleffer, Konrad Dolores, MD  Insulin Pen Needle (PEN NEEDLES) 31G X 5 MM MISC 1 each by Does not apply route in the morning, at noon, in the evening, and at bedtime. 01/16/23   Shamleffer, Konrad Dolores, MD  levothyroxine (SYNTHROID) 88 MCG tablet Take 1 tablet (88 mcg total) by mouth daily. 01/16/23   Shamleffer, Konrad Dolores, MD  losartan (COZAAR) 100 MG tablet Take 100 mg by mouth every evening.    [provider]  NIFEdipine (PROCARDIA-XL/NIFEDICAL-XL) 30 MG 24 hr tablet Take 30 mg by mouth every evening.    [provider]  Octreotide Acetate 100 MCG/ML SOSY Inject 1 mL (100 mcg total) into the skin 3 (three) times daily. 07/19/22   Shamleffer, Konrad Dolores, MD  potassium chloride (KLOR-CON) 10 MEQ tablet Take 1 tablet (10 mEq total) by mouth daily. 04/16/23 04/10/24  [name], MD  simvastatin (ZOCOR) 20 MG tablet Take 20 mg by mouth every evening.  [provider]  SYRINGE-NEEDLE, DISP, 3 ML 25G X 1" 3 ML MISC Inject 1 ml into the skin 3 times daily. 12/02/21   Romero Belling, MD  Vitamin D, Cholecalciferol, 25 MCG (1000 UT) CAPS Take 2 capsules by mouth daily. Patient taking differently: Take 2 capsules by mouth daily with lunch. 07/12/20   Romero Belling, MD      Allergies    Patient has no known allergies.    Review of Systems   Review of Systems  Skin:  Positive for wound.  All other systems reviewed and are negative.   Physical Exam Updated  Vital Signs BP (!) 148/82   Pulse (!) 105   Temp (!) 100.9 F (38.3 C) (Rectal)   Resp 18   Ht 5\' 7"  (1.702 m)   Wt 102 kg   SpO2 96%   BMI 35.22 kg/m  Physical Exam Vitals and nursing note reviewed.  Constitutional:      General: She is not in acute distress.    Appearance: Normal appearance. She is ill-appearing.  HENT:     Head: Normocephalic and atraumatic.  Eyes:     General:        Right eye: No discharge.        Left eye: No discharge.  Cardiovascular:     Rate and Rhythm: Regular rhythm. Tachycardia present.     Heart sounds: No murmur heard.    No friction rub. No gallop.     Comments: Thready 1+ DP, PT pulses bilateral lower extremities Pulmonary:     Effort: Pulmonary effort is normal.     Breath sounds: Normal breath sounds.  Abdominal:     General: Bowel sounds are normal.     Palpations: Abdomen is soft.  Genitourinary:    Comments: Whitish discharge in the vaginal vault without foul odor, cervical motion tenderness, consistent with yeast infection. Skin:    General: Skin is warm and dry.     Capillary Refill: Capillary refill takes 2 to 3 seconds.     Comments: Patient with purulent, foul-smelling wound with ulceration of the right great toe, she is insensate in the surrounding foot, both large at least 3 cm ulcer on the plantar aspect of the right great toe as well as some ulceration at the very distal tip.  Onychomycotic appearing nail  Neurological:     Mental Status: She is alert and oriented to person, place, and time.  Psychiatric:        Mood and Affect: Mood normal.        Behavior: Behavior normal.     ED Results / Procedures / Treatments   Labs (all labs ordered are listed, but only abnormal results are displayed) Labs Reviewed  BASIC METABOLIC PANEL - Abnormal; Notable for the following components:      Result Value   Sodium 129 (*)    Chloride 94 (*)    Glucose, Bld 482 (*)    Creatinine, Ser 1.77 (*)    Calcium 8.6 (*)    GFR,  Estimated 32 (*)    All other components within normal limits  CBC - Abnormal; Notable for the following components:   WBC 12.3 (*)    RBC 3.61 (*)    Hemoglobin 9.8 (*)    HCT 31.1 (*)    All other components within normal limits  BRAIN NATRIURETIC PEPTIDE - Abnormal; Notable for the following components:   B Natriuretic Peptide 214.7 (*)    All other components within  normal limits  CBG MONITORING, ED - Abnormal; Notable for the following components:   Glucose-Capillary 429 (*)    All other components within normal limits  CULTURE, BLOOD (ROUTINE X 2)  CULTURE, BLOOD (ROUTINE X 2)  WET PREP, GENITAL  LACTIC ACID, PLASMA  URINALYSIS, ROUTINE W REFLEX MICROSCOPIC  LACTIC ACID, PLASMA  SEDIMENTATION RATE  C-REACTIVE PROTEIN    EKG None  Radiology DG Foot Complete Right  Result Date: 05/12/2023 CLINICAL DATA:  Great toe wound, concern for osteomyelitis EXAM: RIGHT FOOT COMPLETE - 3+ VIEW COMPARISON:  None Available. FINDINGS: No fracture or dislocation of the right foot. Mild arthrosis. Bony erosion of the tuft of the right great toe. Overlying soft tissue wound. Diffuse soft tissue edema about the foot and ankle. IMPRESSION: 1. Bony erosion of the tuft of the right great toe, concerning for osteomyelitis. Overlying soft tissue wound. 2. No fracture or dislocation of the right foot. 3. Diffuse soft tissue edema about the foot and ankle. Electronically Signed   By: Jearld Lesch M.D.   On: 05/12/2023 21:10    Procedures .Critical Care  Performed by: Olene Floss, PA-C Authorized by: Olene Floss, PA-C   Critical care provider statement:    Critical care time (minutes):  35   Critical care was necessary to treat or prevent imminent or life-threatening deterioration of the following conditions:  Sepsis   Critical care was time spent personally by me on the following activities:  Development of treatment plan with patient or surrogate, discussions with consultants,  evaluation of patient's response to treatment, examination of patient, ordering and review of laboratory studies, ordering and review of radiographic studies, ordering and performing treatments and interventions, pulse oximetry, re-evaluation of patient's condition and review of old charts   Care discussed with: admitting provider     {Document cardiac monitor, telemetry assessment procedure when appropriate:1}  Medications Ordered in ED Medications - No data to display  ED Course/ Medical Decision Making/ A&P    {   Click here for ABCD2, HEART and other calculatorsREFRESH Note before signing :1}                          Medical Decision Making Amount and/or Complexity of Data Reviewed Labs: ordered. Radiology: ordered.  Risk Prescription drug management. Decision regarding hospitalization.   This patient is a 67 y.o. female who presents to the ED for concern of Hyperglycemia, vaginal itching, with toe wound noted on arrival, this involves an extensive number of treatment options, and is a complaint that carries with it a high risk of complications and morbidity. The emergent differential diagnosis prior to evaluation includes, but is not limited to,  *** . This is not an exhaustive differential.   Past Medical History / Co-morbidities / Social History: ***  Additional history: Chart reviewed. Pertinent results include: ***  Physical Exam: Physical exam performed. The pertinent findings include: ***  Lab Tests: I ordered, and personally interpreted labs.  The pertinent results include:  ***   Imaging Studies: I ordered imaging studies including ***. I independently visualized and interpreted imaging which showed ***. I agree with the radiologist interpretation.   Cardiac Monitoring:  The patient was maintained on a cardiac monitor.  My attending physician Dr. Marland Kitchen viewed and interpreted the cardiac monitored which showed an underlying rhythm of: ***. I agree with this  interpretation.   Medications: I ordered medication including ***  for ***. Reevaluation of the patient  after these medicines showed that the patient {resolved/improved/worsened:23923::"improved"}. I have reviewed the patients home medicines and have made adjustments as needed.  Consultations Obtained: I requested consultation with the ***,  and discussed lab and imaging findings as well as pertinent plan - they recommend: ***   Disposition: After consideration of the diagnostic results and the patients response to treatment, I feel that *** .   ***emergency department workup does not suggest an emergent condition requiring admission or immediate intervention beyond what has been performed at this time. The plan is: ***. The patient is safe for discharge and has been instructed to return immediately for worsening symptoms, change in symptoms or any other concerns.  I discussed this case with my attending physician Dr. Marland Kitchen who cosigned this note including patient's presenting symptoms, physical exam, and planned diagnostics and interventions. Attending physician stated agreement with plan or made changes to plan which were implemented.    Final Clinical Impression(s) / ED Diagnoses Final diagnoses:  None    Rx / DC Orders ED Discharge Orders     None

## 2023-05-13 ENCOUNTER — Encounter (HOSPITAL_COMMUNITY): Payer: Self-pay | Admitting: Internal Medicine

## 2023-05-13 DIAGNOSIS — A419 Sepsis, unspecified organism: Secondary | ICD-10-CM | POA: Diagnosis present

## 2023-05-13 DIAGNOSIS — I70235 Atherosclerosis of native arteries of right leg with ulceration of other part of foot: Secondary | ICD-10-CM | POA: Diagnosis not present

## 2023-05-13 DIAGNOSIS — M869 Osteomyelitis, unspecified: Secondary | ICD-10-CM | POA: Diagnosis not present

## 2023-05-13 DIAGNOSIS — N189 Chronic kidney disease, unspecified: Secondary | ICD-10-CM

## 2023-05-13 DIAGNOSIS — I5032 Chronic diastolic (congestive) heart failure: Secondary | ICD-10-CM | POA: Diagnosis present

## 2023-05-13 HISTORY — DX: Sepsis, unspecified organism: A41.9

## 2023-05-13 LAB — PROTIME-INR
INR: 1.2 (ref 0.8–1.2)
Prothrombin Time: 15.1 seconds (ref 11.4–15.2)

## 2023-05-13 LAB — CBC WITH DIFFERENTIAL/PLATELET
Abs Immature Granulocytes: 0.04 10*3/uL (ref 0.00–0.07)
Basophils Absolute: 0 10*3/uL (ref 0.0–0.1)
Basophils Relative: 0 %
Eosinophils Absolute: 0.1 10*3/uL (ref 0.0–0.5)
Eosinophils Relative: 1 %
HCT: 28.2 % — ABNORMAL LOW (ref 36.0–46.0)
Hemoglobin: 8.8 g/dL — ABNORMAL LOW (ref 12.0–15.0)
Immature Granulocytes: 0 %
Lymphocytes Relative: 8 %
Lymphs Abs: 0.8 10*3/uL (ref 0.7–4.0)
MCH: 26.9 pg (ref 26.0–34.0)
MCHC: 31.2 g/dL (ref 30.0–36.0)
MCV: 86.2 fL (ref 80.0–100.0)
Monocytes Absolute: 0.7 10*3/uL (ref 0.1–1.0)
Monocytes Relative: 7 %
Neutro Abs: 8.3 10*3/uL — ABNORMAL HIGH (ref 1.7–7.7)
Neutrophils Relative %: 84 %
Platelets: 200 10*3/uL (ref 150–400)
RBC: 3.27 MIL/uL — ABNORMAL LOW (ref 3.87–5.11)
RDW: 14.6 % (ref 11.5–15.5)
WBC: 9.8 10*3/uL (ref 4.0–10.5)
nRBC: 0 % (ref 0.0–0.2)

## 2023-05-13 LAB — URINALYSIS, ROUTINE W REFLEX MICROSCOPIC
Bilirubin Urine: NEGATIVE
Glucose, UA: >=500 mg/dL — AB
Ketones, ur: 5 mg/dL — AB
Nitrite: NEGATIVE
Protein, ur: 30 mg/dL — AB
Specific Gravity, Urine: 1.014 (ref 1.005–1.030)
pH: 6 (ref 5.0–8.0)

## 2023-05-13 LAB — COMPREHENSIVE METABOLIC PANEL
ALT: 11 U/L (ref 0–44)
AST: 13 U/L — ABNORMAL LOW (ref 15–41)
Albumin: 2.4 g/dL — ABNORMAL LOW (ref 3.5–5.0)
Alkaline Phosphatase: 107 U/L (ref 38–126)
Anion gap: 8 (ref 5–15)
BUN: 15 mg/dL (ref 8–23)
CO2: 25 mmol/L (ref 22–32)
Calcium: 8.7 mg/dL — ABNORMAL LOW (ref 8.9–10.3)
Chloride: 99 mmol/L (ref 98–111)
Creatinine, Ser: 1.36 mg/dL — ABNORMAL HIGH (ref 0.44–1.00)
GFR, Estimated: 43 mL/min — ABNORMAL LOW (ref >60)
Glucose, Bld: 317 mg/dL — ABNORMAL HIGH (ref 70–99)
Potassium: 3.5 mmol/L (ref 3.5–5.1)
Sodium: 132 mmol/L — ABNORMAL LOW (ref 135–145)
Total Bilirubin: 0.9 mg/dL (ref 0.3–1.2)
Total Protein: 6.4 g/dL — ABNORMAL LOW (ref 6.5–8.1)

## 2023-05-13 LAB — CBG MONITORING, ED
Glucose-Capillary: 337 mg/dL — ABNORMAL HIGH (ref 70–99)
Glucose-Capillary: 306 mg/dL — ABNORMAL HIGH (ref 70–99)

## 2023-05-13 LAB — WET PREP, GENITAL
Clue Cells Wet Prep HPF POC: NONE SEEN
Sperm: NONE SEEN
Trich, Wet Prep: NONE SEEN
WBC, Wet Prep HPF POC: <10 (ref <10)
Yeast Wet Prep HPF POC: NONE SEEN

## 2023-05-13 LAB — C-REACTIVE PROTEIN: CRP: 4.9 mg/dL — ABNORMAL HIGH (ref <1.0)

## 2023-05-13 LAB — MAGNESIUM: Magnesium: 1.7 mg/dL (ref 1.7–2.4)

## 2023-05-13 LAB — GLUCOSE, CAPILLARY
Glucose-Capillary: 267 mg/dL — ABNORMAL HIGH (ref 70–99)
Glucose-Capillary: 306 mg/dL — ABNORMAL HIGH (ref 70–99)
Glucose-Capillary: 291 mg/dL — ABNORMAL HIGH (ref 70–99)
Glucose-Capillary: 214 mg/dL — ABNORMAL HIGH (ref 70–99)

## 2023-05-13 LAB — CULTURE, BLOOD (ROUTINE X 2)

## 2023-05-13 MED ORDER — OXYCODONE HCL 5 MG PO TABS
5.0000 mg | ORAL_TABLET | Freq: Four times a day (QID) | ORAL | Status: DC | PRN
  Administered 2023-05-18: 5 mg via ORAL
  Filled 2023-05-13: qty 1

## 2023-05-13 MED ORDER — HYDROMORPHONE HCL 1 MG/ML IJ SOLN: 0.5000 mg | INTRAMUSCULAR | Status: DC | PRN

## 2023-05-13 MED ORDER — INSULIN GLARGINE-YFGN 100 UNIT/ML ~~LOC~~ SOLN
20.0000 [IU] | Freq: Every day | SUBCUTANEOUS | Status: DC
  Filled 2023-05-13: qty 0.2

## 2023-05-13 MED ORDER — INSULIN ASPART 100 UNIT/ML IJ SOLN
3.0000 [IU] | Freq: Three times a day (TID) | INTRAMUSCULAR | Status: DC
  Administered 2023-05-13 – 2023-05-15 (×4): 3 [IU] via SUBCUTANEOUS

## 2023-05-13 MED ORDER — NIFEDIPINE ER OSMOTIC RELEASE 30 MG PO TB24
30.0000 mg | ORAL_TABLET | Freq: Every evening | ORAL | Status: DC
  Administered 2023-05-13 – 2023-05-14 (×2): 30 mg via ORAL
  Filled 2023-05-13 (×2): qty 1

## 2023-05-13 MED ORDER — CARVEDILOL 25 MG PO TABS
25.0000 mg | ORAL_TABLET | Freq: Two times a day (BID) | ORAL | Status: DC
  Administered 2023-05-13 – 2023-05-21 (×15): 25 mg via ORAL
  Filled 2023-05-13 (×16): qty 1

## 2023-05-13 MED ORDER — VANCOMYCIN HCL 1500 MG/300ML IV SOLN: 1500.0000 mg | INTRAVENOUS | Status: DC

## 2023-05-13 MED ORDER — ASPIRIN 81 MG PO TBEC
81.0000 mg | DELAYED_RELEASE_TABLET | Freq: Every day | ORAL | Status: DC
  Administered 2023-05-13 – 2023-05-21 (×9): 81 mg via ORAL
  Filled 2023-05-13 (×9): qty 1

## 2023-05-13 MED ORDER — FUROSEMIDE 40 MG PO TABS
40.0000 mg | ORAL_TABLET | Freq: Two times a day (BID) | ORAL | Status: DC
  Administered 2023-05-13 – 2023-05-15 (×4): 40 mg via ORAL
  Filled 2023-05-13 (×4): qty 1

## 2023-05-13 MED ORDER — LEVOTHYROXINE SODIUM 88 MCG PO TABS
88.0000 ug | ORAL_TABLET | Freq: Every day | ORAL | Status: DC
  Administered 2023-05-13 – 2023-05-21 (×8): 88 ug via ORAL
  Filled 2023-05-13 (×8): qty 1

## 2023-05-13 MED ORDER — INSULIN GLARGINE-YFGN 100 UNIT/ML ~~LOC~~ SOLN
35.0000 [IU] | Freq: Every day | SUBCUTANEOUS | Status: DC
  Administered 2023-05-13 – 2023-05-14 (×2): 35 [IU] via SUBCUTANEOUS
  Filled 2023-05-13 (×3): qty 0.35

## 2023-05-13 MED ORDER — ENOXAPARIN SODIUM 60 MG/0.6ML IJ SOSY
50.0000 mg | PREFILLED_SYRINGE | INTRAMUSCULAR | Status: DC
  Administered 2023-05-13 – 2023-05-15 (×3): 50 mg via SUBCUTANEOUS
  Filled 2023-05-13 (×3): qty 0.6

## 2023-05-13 MED ORDER — SIMVASTATIN 20 MG PO TABS
20.0000 mg | ORAL_TABLET | Freq: Every evening | ORAL | Status: DC
  Administered 2023-05-13 – 2023-05-20 (×8): 20 mg via ORAL
  Filled 2023-05-13 (×8): qty 1

## 2023-05-13 MED ORDER — POTASSIUM CHLORIDE CRYS ER 10 MEQ PO TBCR
10.0000 meq | EXTENDED_RELEASE_TABLET | Freq: Every day | ORAL | Status: DC
  Administered 2023-05-13 – 2023-05-21 (×8): 10 meq via ORAL
  Filled 2023-05-13 (×10): qty 1

## 2023-05-13 NOTE — Progress Notes (Signed)
ANTICOAGULATION CONSULT NOTE  Pharmacy Consult for Enoxaparin Indication: VTE prophylaxis  No Known Allergies  Patient Measurements: Height: 5\' 7"  (170.2 cm) Weight: 103.3 kg (227 lb 11.8 oz) IBW/kg (Calculated) : 61.6   Vital Signs: Temp: 98.9 F (37.2 C) (06/30 0923) Temp Source: Oral (06/30 0923) BP: 141/79 (06/30 0923) Pulse Rate: 87 (06/30 0923)  Labs: Recent Labs    05/12/23 1920  HGB 9.8*  HCT 31.1*  PLT 231  CREATININE 1.77*    Estimated Creatinine Clearance: 39.2 mL/min (A) (by C-G formula based on SCr of 1.77 mg/dL (H)).   Medical History: Past Medical History:  Diagnosis Date   Cataract    bilateral lens implants   CHF (congestive heart failure) (HCC)    Diabetes mellitus without complication (HCC)    Hyperlipidemia    Hypertension    Pituitary adenoma with extrasellar extension (HCC)    Sleep apnea    no c-pap   Thyroid disease    hypothyroidism    Medications:  Medications Prior to Admission  Medication Sig Dispense Refill Last Dose   aspirin EC 81 MG tablet Take 81 mg by mouth in the morning.   05/11/2023   carvedilol (COREG) 25 MG tablet Take 25 mg by mouth 2 (two) times daily with a meal.   05/11/2023   furosemide (LASIX) 40 MG tablet TAKE 1 TABLET BY MOUTH TWICE A DAY 180 tablet 3 05/11/2023   hydrocortisone 2.5 % ointment Apply topically 2 (two) times daily. As needed for mild eczema.  Do not use for more than 1-2 weeks at a time. (Patient taking differently: Apply 1 Application topically daily.) 30 g 3 unk   insulin aspart (NOVOLOG FLEXPEN) 100 UNIT/ML FlexPen Max daily 45 units (Patient taking differently: Inject 0-6 Units into the skin 3 (three) times daily before meals. Max daily 45 units) 45 mL 3 05/12/2023   Insulin Glargine (BASAGLAR KWIKPEN) 100 UNIT/ML Inject 40 Units into the skin daily. 45 mL 3 05/12/2023   levothyroxine (SYNTHROID) 88 MCG tablet Take 1 tablet (88 mcg total) by mouth daily. 90 tablet 3 05/11/2023   losartan (COZAAR)  100 MG tablet Take 100 mg by mouth every evening.   05/11/2023   NIFEdipine (PROCARDIA-XL/NIFEDICAL-XL) 30 MG 24 hr tablet Take 30 mg by mouth every evening.   05/11/2023   potassium chloride (KLOR-CON) 10 MEQ tablet Take 1 tablet (10 mEq total) by mouth daily. 90 tablet 3 05/11/2023   simvastatin (ZOCOR) 20 MG tablet Take 20 mg by mouth every evening.   05/11/2023   Vitamin D, Cholecalciferol, 25 MCG (1000 UT) CAPS Take 2 capsules by mouth daily. (Patient taking differently: Take 2 capsules by mouth daily with lunch.) 60 capsule 0 05/11/2023   cabergoline (DOSTINEX) 0.5 MG tablet Take 1 tablet (0.5 mg total) by mouth 2 (two) times a week. (Patient not taking: Reported on 04/20/2023) 24 tablet 3 Not Taking   clopidogrel (PLAVIX) 75 MG tablet Take 1 tablet (75 mg total) by mouth daily. (Patient not taking: Reported on 05/12/2023) 30 tablet 11 Not Taking   glucose blood test strip 1 each by Other route 3 (three) times daily. Use as instructed      Insulin Pen Needle (PEN NEEDLES) 31G X 5 MM MISC 1 each by Does not apply route in the morning, at noon, in the evening, and at bedtime. 400 each 3    SYRINGE-NEEDLE, DISP, 3 ML 25G X 1" 3 ML MISC Inject 1 ml into the skin 3 times daily. 100  each 11    Scheduled:   aspirin EC  81 mg Oral Daily   carvedilol  25 mg Oral BID WC   furosemide  40 mg Oral BID   insulin aspart  0-15 Units Subcutaneous TID WC   insulin aspart  0-5 Units Subcutaneous QHS   insulin aspart  3 Units Subcutaneous TID WC   insulin glargine-yfgn  35 Units Subcutaneous Daily   levothyroxine  88 mcg Oral Daily   NIFEdipine  30 mg Oral QPM   potassium chloride  10 mEq Oral Daily   simvastatin  20 mg Oral QPM   Infusions:   ceFEPime (MAXIPIME) IV 2 g (05/13/23 0836)   metronidazole 500 mg (05/13/23 1027)   [START ON 05/15/2023] vancomycin     PRN: acetaminophen **OR** acetaminophen, HYDROmorphone (DILAUDID) injection, melatonin, naLOXone (NARCAN)  injection, ondansetron (ZOFRAN) IV,  oxyCODONE  Assessment: 67 year old female admitted for R diabetic foot infection complicating underlying DM2 with likely peripheral neuropathy and PAD.  PMH of obesity (wt 103.3 kg, BMI 35.7) and CKD (CrCl 39 mL/min) are also noted.  Orders received for pharmacy to dose enoxaparin for VTE prophylaxis.   Plan:  Lovenox 50 mg [place] q24h Follow renal function - if CrCl falls below 30 mL/min consider checking anti-Xa level vs switching to unfractionated [place] heparin. Follow clinical course and for signs/symptoms of bleeding.  Elie Goody, PharmD, BCPS Clinical Pharmacist 05/13/2023  10:44 AM

## 2023-05-13 NOTE — Consult Note (Addendum)
[facility]  Patient name: Danielle Schroeder MRN: [id number] DOB: September 13, 1956 Sex: female   REQUESTING PROVIDER:   Hospitalist   REASON FOR CONSULT:    Right toe ulcer  HISTORY OF PRESENT ILLNESS:   Danielle Schroeder is a 67 y.o. female, who was admitted on 05/12/2023 with sepsis due to osteomyelitis of the right great toe.  She had an ulcer that began approximately 1 month ago that has been slow to heal.  She underwent angiography by Dr. 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  At that time the right and nearly healed.  She underwent superficial femoral artery stenting and angioplasty of the anterior tibial artery both on the left.  The right leg was not evaluated.  The patient has a history of type 2 diabetes.  She has chronic diastolic congestive heart failure.  She is medically managed for hypertension.  PAST MEDICAL HISTORY    Past Medical History:  Diagnosis Date   Cataract    bilateral lens implants   CHF (congestive heart failure) (HCC)    Diabetes mellitus without complication (HCC)    Hyperlipidemia    Hypertension    Pituitary adenoma with extrasellar extension (HCC)    Sleep apnea    no c-pap   Thyroid disease    hypothyroidism     FAMILY HISTORY   Family History  Problem Relation Age of Onset   Thyroid disease Neg Hx    Colon cancer Neg Hx    Esophageal cancer Neg Hx    Rectal cancer Neg Hx    Stomach cancer Neg Hx     SOCIAL HISTORY:   Social History   Socioeconomic History   Marital status: Single    Spouse name: Not on file   Number of children: Not on file   Years of education: Not on file   Highest education level: Not on file  Occupational History   Not on file  Tobacco Use   Smoking status: Never   Smokeless tobacco: Never  Vaping Use   Vaping Use: Never used  Substance and Sexual Activity   Alcohol use: Never   Drug use:  Never   Sexual activity: Not Currently  Other Topics Concern   Not on file  Social History Narrative   Not on file   Social Determinants of Health   Financial Resource Strain: Low Risk  (04/20/2023)   Overall Financial Resource Strain (CARDIA)    Difficulty of Paying Living Expenses: Not hard at all  Food Insecurity: No Food Insecurity (05/13/2023)   Hunger Vital Sign    Worried About Running Out of Food in the Last Year: Never true    Ran Out of Food in the Last Year: Never true  Transportation Needs: No Transportation Needs (05/13/2023)   PRAPARE - Administrator, Civil Service (Medical): No    Lack of Transportation (Non-Medical): No  Physical Activity: Inactive (04/20/2023)   Exercise Vital Sign    Days of Exercise per Week: 0 days    Minutes of Exercise per Session: 0 min  Stress: No Stress Concern Present (04/20/2023)   [employer] of Occupational Health - Occupational Stress Questionnaire    Feeling of Stress : Not at all  Social Connections: Not on file  Intimate Partner Violence: Not At Risk (05/13/2023)   Humiliation, Afraid, Rape, and Kick questionnaire    Fear of Current or Ex-Partner: No    Emotionally Abused:  No    Physically Abused: No    Sexually Abused: No    ALLERGIES:    No Known Allergies  CURRENT MEDICATIONS:    Current Facility-Administered Medications  Medication Dose Route Frequency Provider Last Rate Last Admin   acetaminophen (TYLENOL) tablet 650 mg  650 mg Oral Q6H PRN Hongalgi, Maximino Greenland, MD       Or   acetaminophen (TYLENOL) suppository 650 mg  650 mg Rectal Q6H PRN Hongalgi, Maximino Greenland, MD       aspirin EC tablet 81 mg  81 mg Oral Daily Elease Etienne, MD   81 mg at 05/13/23 1119   carvedilol (COREG) tablet 25 mg  25 mg Oral BID WC Elease Etienne, MD   25 mg at 05/13/23 1737   ceFEPIme (MAXIPIME) 2 g in sodium chloride 0.9 % 100 mL IVPB  2 g Intravenous Q12H Marcellus Scott D, MD 200 mL/hr at 05/13/23 0836 2 g at 05/13/23 0836    enoxaparin (LOVENOX) injection 50 mg  50 mg Subcutaneous Q24H Absher, Ky Barban, RPH   50 mg at 05/13/23 1738   furosemide (LASIX) tablet 40 mg  40 mg Oral BID Marcellus Scott D, MD   40 mg at 05/13/23 1738   HYDROmorphone (DILAUDID) injection 0.5 mg  0.5 mg Intravenous Q4H PRN Hongalgi, Maximino Greenland, MD       insulin aspart (novoLOG) injection 0-15 Units  0-15 Units Subcutaneous TID WC Elease Etienne, MD   8 Units at 05/13/23 1739   insulin aspart (novoLOG) injection 0-5 Units  0-5 Units Subcutaneous QHS Elease Etienne, MD   4 Units at 05/13/23 0252   insulin aspart (novoLOG) injection 3 Units  3 Units Subcutaneous TID WC Elease Etienne, MD   3 Units at 05/13/23 1738   insulin glargine-yfgn (SEMGLEE) injection 35 Units  35 Units Subcutaneous Daily Elease Etienne, MD   35 Units at 05/13/23 1115   levothyroxine (SYNTHROID) tablet 88 mcg  88 mcg Oral Daily Elease Etienne, MD   88 mcg at 05/13/23 5956   melatonin tablet 3 mg  3 mg Oral QHS PRN Elease Etienne, MD       metroNIDAZOLE (FLAGYL) IVPB 500 mg  500 mg Intravenous Q12H Marcellus Scott D, MD 100 mL/hr at 05/13/23 1027 500 mg at 05/13/23 1027   naloxone (NARCAN) injection 0.4 mg  0.4 mg Intravenous PRN Elease Etienne, MD       NIFEdipine (PROCARDIA-XL/NIFEDICAL-XL) 24 hr tablet 30 mg  30 mg Oral QPM Hongalgi, Maximino Greenland, MD   30 mg at 05/13/23 1737   ondansetron (ZOFRAN) injection 4 mg  4 mg Intravenous Q6H PRN Hongalgi, Maximino Greenland, MD       oxyCODONE (Oxy IR/ROXICODONE) immediate release tablet 5 mg  5 mg Oral Q6H PRN Hongalgi, Maximino Greenland, MD       potassium chloride (KLOR-CON) CR tablet 10 mEq  10 mEq Oral Daily Marcellus Scott D, MD   10 mEq at 05/13/23 1119   simvastatin (ZOCOR) tablet 20 mg  20 mg Oral QPM Hongalgi, Anand D, MD   20 mg at 05/13/23 1737   [START ON 05/15/2023] vancomycin (VANCOREADY) IVPB 1500 mg/300 mL  1,500 mg Intravenous Q48H Hongalgi, Anand D, MD        REVIEW OF SYSTEMS:   [X]  denotes positive finding, [ ]   denotes negative finding Cardiac  Comments:  Chest pain or chest pressure:    Shortness of breath upon exertion:  Short of breath when lying flat:    Irregular heart rhythm:        Vascular    Pain in calf, thigh, or hip brought on by ambulation:    Pain in feet at night that wakes you up from your sleep:     Blood clot in your veins:    Leg swelling:         Pulmonary    Oxygen at home:    Productive cough:     Wheezing:         Neurologic    Sudden weakness in arms or legs:     Sudden numbness in arms or legs:     Sudden onset of difficulty speaking or slurred speech:    Temporary loss of vision in one eye:     Problems with dizziness:         Gastrointestinal    Blood in stool:      Vomited blood:         Genitourinary    Burning when urinating:     Blood in urine:        Psychiatric    Major depression:         Hematologic    Bleeding problems:    Problems with blood clotting too easily:        Skin    Rashes or ulcers: x       Constitutional    Fever or chills:     PHYSICAL EXAM:   Vitals:   05/13/23 0534 05/13/23 0923 05/13/23 1225 05/13/23 1621  BP: (!) 149/80 (!) 141/79 (!) 150/91 (!) 181/74  Pulse: 91 87 90 87  Resp: 18 16 20 19   Temp: 98.8 F (37.1 C) 98.9 F (37.2 C) 98.2 F (36.8 C) 98.5 F (36.9 C)  TempSrc: Oral Oral Oral Oral  SpO2: 91% 91% 93% 93%  Weight:      Height:        GENERAL: The patient is a well-nourished female, in no acute distress. The vital signs are documented above. CARDIAC: There is a regular rate and rhythm.  VASCULAR: Palpable femoral pulses PULMONARY: Nonlabored respirations ABDOMEN: Soft and non-tender  MUSCULOSKELETAL: There are no major deformities or cyanosis. NEUROLOGIC: No focal weakness or paresthesias are detected. SKIN: See photo below PSYCHIATRIC: The patient has a normal affect.     STUDIES:   ABIs are pending  ASSESSMENT and PLAN   Lower extremity atherosclerotic vascular disease  with diabetic right great toe infection: I discussed with the patient that she is going to require toe amputation at minimum.  This may become a more proximal amputation.  Before doing this, she needs to undergo arterial evaluation to see if there is anything we can do to improve her blood flow.  I suspect this will be femoral-popliteal and tibial disease.  Atherectomy and/or stenting would be the preferred modality in the femoral-popliteal vessels and angioplasty in the tibial vessels, as these have the best long-term patency.  The patient should continue IV antibiotics.  She should continue statin therapy.  She is on aspirin and would benefit from Plavix.  She has been placed on the angiogram scheduled for Wednesday.   Charlena Cross, MD, FACS [facility] [phone] Pager [phone]

## 2023-05-13 NOTE — Inpatient Diabetes Management (Signed)
Inpatient Diabetes Program Recommendations  AACE/ADA: New Consensus Statement on Inpatient Glycemic Control (2015)  Target Ranges:  Prepandial:   less than 140 mg/dL      Peak postprandial:   less than 180 mg/dL (1-2 hours)      Critically ill patients:  140 - 180 mg/dL   Lab Results  Component Value Date   GLUCAP 306 (H) 05/13/2023   HGBA1C 12.6 (A) 01/16/2023    Review of Glycemic Control  Latest Reference Range & Units 05/12/23 19:55 05/12/23 22:52 05/13/23 02:44 05/13/23 04:23 05/13/23 08:02 05/13/23 13:25  Glucose-Capillary 70 - 99 mg/dL 161 (H) 096 (H) 045 (H) 306 (H) 267 (H) 306 (H)   Diabetes history: DM 2 Outpatient Diabetes medications: Basaglar 40 units Daily, Novolog 0-6 units tid Current orders for Inpatient glycemic control:  Semglee 35 units Daily Novolog 0-15 units tid + hs Novolog 3 units tid meal coverage  A1c pending  Inpatient Diabetes Program Recommendations:    Note: First dose of Semglee 35 units given this am. Watch glucose trends today with the Semglee dose this am. Will adjust tomorrow. Will also see pt this admission regarding glucose control at home. Glucose 482 on presentation and last A1c in March this year was elevated at 12.6%.   Thanks,  Christena Deem RN, MSN, BC-ADM Inpatient Diabetes Coordinator Team Pager [phone] (8a-5p)

## 2023-05-13 NOTE — ED Notes (Signed)
ED TO INPATIENT HANDOFF REPORT  ED Nurse Name and Phone #: Claiborne Rigg Name/Age/Gender Danielle Schroeder 67 y.o. female Room/Bed: WA22/WA22  Code Status   Code Status: Full Code  Home/SNF/Other Home Patient oriented to: self, place, time, and situation Is this baseline? Yes   Triage Complete: Triage complete  Chief Complaint Osteomyelitis of right lower extremity (HCC) [M86.9]  Triage Note Pt BIBA from home. C/o hyperglycemia (485), and vaginal itching.  Given 500 mL NS  BP: 166/72 HR: 102 SPO2: 94 on 2L [place]   Allergies No Known Allergies  Level of Care/Admitting Diagnosis ED Disposition     ED Disposition  Admit   Condition  --   Comment  Hospital Area: [facility] [[id number]]  Level of Care: Telemetry [5]  Admit to tele based on following criteria: Monitor for Ischemic changes  May admit patient to [facility] or [facility] if equivalent level of care is available:: No  Covid Evaluation: Asymptomatic - no recent exposure (last 10 days) testing not required  Diagnosis: Osteomyelitis of right lower extremity [facility]) [[id number]]  Admitting Physician: Angie Fava [[id number]]  Attending Physician: Angie Fava [[id number]]  Certification:: I certify this patient will need inpatient services for at least 2 midnights  Estimated Length of Stay: 2          B Medical/Surgery History Past Medical History:  Diagnosis Date   Cataract    bilateral lens implants   CHF (congestive heart failure) (HCC)    Diabetes mellitus without complication (HCC)    Hyperlipidemia    Hypertension    Pituitary adenoma with extrasellar extension (HCC)    Sleep apnea    no c-pap   Thyroid disease    hypothyroidism   Past Surgical History:  Procedure Laterality Date   ABDOMINAL AORTOGRAM W/LOWER EXTREMITY Left 04/11/2023   Procedure: ABDOMINAL AORTOGRAM W/LOWER EXTREMITY;  Surgeon: Victorino Sparrow, MD;  Location: [facility] INVASIVE CV LAB;  Service:  Cardiovascular;  Laterality: Left;   BREAST SURGERY     CHOLECYSTECTOMY     PERIPHERAL VASCULAR BALLOON ANGIOPLASTY Left 04/11/2023   Procedure: PERIPHERAL VASCULAR BALLOON ANGIOPLASTY;  Surgeon: Victorino Sparrow, MD;  Location: [facility] INVASIVE CV LAB;  Service: Cardiovascular;  Laterality: Left;  Lt AT   PERIPHERAL VASCULAR INTERVENTION Left 04/11/2023   Procedure: PERIPHERAL VASCULAR INTERVENTION;  Surgeon: Victorino Sparrow, MD;  Location: [facility] INVASIVE CV LAB;  Service: Cardiovascular;  Laterality: Left;  Lt SFA   TONSILLECTOMY     TUBAL LIGATION       A IV Location/Drains/Wounds Patient Lines/Drains/Airways Status     Active Line/Drains/Airways     Name Placement date Placement time Site Days   Peripheral IV 05/12/23 18 G Left Antecubital 05/12/23  1907  Antecubital  1   Peripheral IV 05/12/23 20 G 1" Right Antecubital 05/12/23  2042  Antecubital  1   Wound / Incision (Open or Dehisced) 05/12/23 Diabetic ulcer Toe (Comment  which one) Anterior;Right;Posterior diabetic ulcer to anterior right great toe; toe is discolored/yellow. necrotic on the posterior toe 05/12/23  2243  Toe (Comment  which one)  1            Intake/Output Last 24 hours  Intake/Output Summary (Last 24 hours) at 05/13/2023 0506 Last data filed at 05/13/2023 0358 Gross per 24 hour  Intake 600 ml  Output --  Net 600 ml    Labs/Imaging Results for orders placed or performed during the hospital encounter of 05/12/23 (  from the past 48 hour(s))  Basic metabolic panel     Status: Abnormal   Collection Time: 05/12/23  7:20 PM  Result Value Ref Range   Sodium 129 (L) 135 - 145 mmol/L   Potassium 3.9 3.5 - 5.1 mmol/L   Chloride 94 (L) 98 - 111 mmol/L   CO2 23 22 - 32 mmol/L   Glucose, Bld 482 (H) 70 - 99 mg/dL    Comment: Glucose reference range applies only to samples taken after fasting for at least 8 hours.   BUN 16 8 - 23 mg/dL   Creatinine, Ser 1.61 (H) 0.44 - 1.00 mg/dL   Calcium 8.6 (L) 8.9 - 10.3 mg/dL    GFR, Estimated 32 (L) >60 mL/min    Comment: (NOTE) Calculated using the CKD-EPI Creatinine Equation (2021)    Anion gap 12 5 - 15    Comment: Performed at [facility], [address], [place], [place] [address]  CBC     Status: Abnormal   Collection Time: 05/12/23  7:20 PM  Result Value Ref Range   WBC 12.3 (H) 4.0 - 10.5 K/uL   RBC 3.61 (L) 3.87 - 5.11 MIL/uL   Hemoglobin 9.8 (L) 12.0 - 15.0 g/dL   HCT 54.0 (L) 98.1 - 19.1 %   MCV 86.1 80.0 - 100.0 fL   MCH 27.1 26.0 - 34.0 pg   MCHC 31.5 30.0 - 36.0 g/dL   RDW 47.8 29.5 - 62.1 %   Platelets 231 150 - 400 K/uL   nRBC 0.0 0.0 - 0.2 %    Comment: Performed at [facility], [address] [place], [place] [address]  CBG monitoring, ED     Status: Abnormal   Collection Time: 05/12/23  7:55 PM  Result Value Ref Range   Glucose-Capillary 429 (H) 70 - 99 mg/dL    Comment: Glucose reference range applies only to samples taken after fasting for at least 8 hours.  Lactic acid, plasma     Status: None   Collection Time: 05/12/23  8:21 PM  Result Value Ref Range   Lactic Acid, Venous 1.8 0.5 - 1.9 mmol/L    Comment: Performed at [facility], [address], [place], [place] [address]  Sedimentation rate     Status: Abnormal   Collection Time: 05/12/23  8:21 PM  Result Value Ref Range   Sed Rate 61 (H) 0 - 22 mm/hr    Comment: Performed at [facility], [address], [place], [place] [address]  C-reactive protein     Status: Abnormal   Collection Time: 05/12/23  8:21 PM  Result Value Ref Range   CRP 4.9 (H) <1.0 mg/dL    Comment: Performed at [facility], [address] [place], [place] [address]  Brain natriuretic peptide     Status: Abnormal   Collection Time: 05/12/23  8:22 PM  Result Value Ref Range   B Natriuretic Peptide 214.7 (H) 0.0 - 100.0 pg/mL    Comment: Performed at [facility], [address], [place],  [place] [address]  Wet prep, genital     Status: None   Collection Time: 05/12/23  9:47 PM  Result Value Ref Range   Yeast Wet Prep HPF POC NONE SEEN NONE SEEN   Trich, Wet Prep NONE SEEN NONE SEEN   Clue Cells Wet Prep HPF POC NONE SEEN NONE SEEN   WBC, Wet Prep HPF POC <10 <10  Sperm NONE SEEN     Comment: Performed at [facility], [address] [place], [place] [address]  CBG monitoring, ED     Status: Abnormal   Collection Time: 05/12/23 10:52 PM  Result Value Ref Range   Glucose-Capillary 380 (H) 70 - 99 mg/dL    Comment: Glucose reference range applies only to samples taken after fasting for at least 8 hours.  Urinalysis, Routine w reflex microscopic -Urine, Clean Catch     Status: Abnormal   Collection Time: 05/13/23 12:55 AM  Result Value Ref Range   Color, Urine YELLOW YELLOW   APPearance CLEAR CLEAR   Specific Gravity, Urine 1.014 1.005 - 1.030   pH 6.0 5.0 - 8.0   Glucose, UA >=500 (A) NEGATIVE mg/dL   Hgb urine dipstick MODERATE (A) NEGATIVE   Bilirubin Urine NEGATIVE NEGATIVE   Ketones, ur 5 (A) NEGATIVE mg/dL   Protein, ur 30 (A) NEGATIVE mg/dL   Nitrite NEGATIVE NEGATIVE   Leukocytes,Ua TRACE (A) NEGATIVE   RBC / HPF 6-10 0 - 5 RBC/hpf   WBC, UA 6-10 0 - 5 WBC/hpf   Bacteria, UA RARE (A) NONE SEEN   Squamous Epithelial / HPF 0-5 0 - 5 /HPF    Comment: Performed at [facility], [address], [place], [place] [address]  CBG monitoring, ED     Status: Abnormal   Collection Time: 05/13/23  2:44 AM  Result Value Ref Range   Glucose-Capillary 337 (H) 70 - 99 mg/dL    Comment: Glucose reference range applies only to samples taken after fasting for at least 8 hours.  CBG monitoring, ED     Status: Abnormal   Collection Time: 05/13/23  4:23 AM  Result Value Ref Range   Glucose-Capillary 306 (H) 70 - 99 mg/dL    Comment: Glucose reference range applies only to samples taken after fasting for at least 8 hours.   DG Foot Complete  Right  Result Date: 05/12/2023 CLINICAL DATA:  Great toe wound, concern for osteomyelitis EXAM: RIGHT FOOT COMPLETE - 3+ VIEW COMPARISON:  None Available. FINDINGS: No fracture or dislocation of the right foot. Mild arthrosis. Bony erosion of the tuft of the right great toe. Overlying soft tissue wound. Diffuse soft tissue edema about the foot and ankle. IMPRESSION: 1. Bony erosion of the tuft of the right great toe, concerning for osteomyelitis. Overlying soft tissue wound. 2. No fracture or dislocation of the right foot. 3. Diffuse soft tissue edema about the foot and ankle. Electronically Signed   By: Jearld Lesch M.D.   On: 05/12/2023 21:10    Pending Labs Unresulted Labs (From admission, onward)     Start     Ordered   05/13/23 0500  CBC with Differential/Platelet  Tomorrow morning,   R        05/12/23 2243   05/13/23 0500  Comprehensive metabolic panel  Tomorrow morning,   R        05/12/23 2243   05/13/23 0500  Magnesium  Tomorrow morning,   R        05/12/23 2243   05/12/23 2246  Hemoglobin A1c  Add-on,   AD       Comments: To assess prior glycemic control    05/12/23 2245   05/12/23 2244  Magnesium  Add-on,   AD        05/12/23 2243   05/12/23 2021  Blood culture (routine x 2)  BLOOD CULTURE X 2,  R (with STAT occurrences)      05/12/23 2021            Vitals/Pain Today's Vitals   05/13/23 0330 05/13/23 0400 05/13/23 0422 05/13/23 0500  BP: (!) 163/82 (!) 141/67  (!) 161/85  Pulse: 87 87  87  Resp: 20 17  18   Temp:   98.4 F (36.9 C)   TempSrc:   Oral   SpO2: 93% 94%  96%  Weight:      Height:      PainSc:        Isolation Precautions No active isolations  Medications Medications  lactated ringers infusion ( Intravenous New Bag/Given 05/12/23 2241)  acetaminophen (TYLENOL) tablet 650 mg (has no administration in time range)    Or  acetaminophen (TYLENOL) suppository 650 mg (has no administration in time range)  melatonin tablet 3 mg (has no  administration in time range)  ondansetron (ZOFRAN) injection 4 mg (has no administration in time range)  naloxone (NARCAN) injection 0.4 mg (has no administration in time range)  fentaNYL (SUBLIMAZE) injection 25 mcg (has no administration in time range)  metroNIDAZOLE (FLAGYL) IVPB 500 mg (0 mg Intravenous Stopped 05/13/23 0358)  insulin aspart (novoLOG) injection 0-15 Units (has no administration in time range)  insulin aspart (novoLOG) injection 0-5 Units (4 Units Subcutaneous Given 05/13/23 0252)  ceFEPIme (MAXIPIME) 2 g in sodium chloride 0.9 % 100 mL IVPB (has no administration in time range)  vancomycin (VANCOREADY) IVPB 1500 mg/300 mL (has no administration in time range)  vancomycin (VANCOREADY) IVPB 2000 mg/400 mL (0 mg Intravenous Stopped 05/13/23 0246)  ceFEPIme (MAXIPIME) 2 g in sodium chloride 0.9 % 100 mL IVPB (0 g Intravenous Stopped 05/12/23 2239)    Mobility walks with person assist     Focused Assessments Endocrine - hyperglycemic Ortho - osteo of right great toe  R Recommendations: See Admitting Provider Note  Report given to:   Additional Notes: n/a

## 2023-05-13 NOTE — Progress Notes (Signed)
Pharmacy Antibiotic Note  Danielle Schroeder is a 67 y.o. female admitted on 05/12/2023 with medical history significant for pituitary adenoma, acromegaly, chronic venous insufficiency bilateral lower extremities, as well as arterial insufficiency, diabetes, diabetic retinopathy who presents with concern for elevated blood sugar, vaginal itching, patient with notable wound to her right great toe on arrival. .  Pharmacy has been consulted to dose vancomycin and cefepime for osteomyelitis.  Plan: Vancomycin 2gm x 1 then 1500mg  q48h (AUC 490.6, Scr 1.77) Cefepime 2gm IV q12h Follow renal function and clinical course  Height: 5\' 7"  (170.2 cm) Weight: 102 kg (224 lb 13.9 oz) IBW/kg (Calculated) : 61.6  Temp (24hrs), Avg:99.9 F (37.7 C), Min:99.3 F (37.4 C), Max:100.9 F (38.3 C)  Recent Labs  Lab 05/12/23 1920 05/12/23 2021  WBC 12.3*  --   CREATININE 1.77*  --   LATICACIDVEN  --  1.8    Estimated Creatinine Clearance: 38.9 mL/min (A) (by C-G formula based on SCr of 1.77 mg/dL (H)).    No Known Allergies  Antimicrobials this admission: 6/29 cefepime >> 6/30 vanc >> 6/30 flagyl >>   Dose adjustments this admission:   Microbiology results: 6/29 BCx:   Thank you for allowing pharmacy to be a part of this patient's care.  Arley Phenix RPh 05/13/2023, 1:15 AM

## 2023-05-13 NOTE — Progress Notes (Addendum)
PROGRESS NOTE   Danielle Schroeder  JXB:[id number]    DOB: 05/27/1956    DOA: 05/12/2023  PCP: [name], MD   I have briefly reviewed patients previous medical records in [facility].  Chief Complaint  Patient presents with   Hyperglycemia   Vaginal Itching    Brief Narrative:  67 year old female with medical history significant for type II DM/IDDM, PAD with balloon angioplasty and left superficial femoral artery stenting 04/11/2023 by Dr. Nolon Bussing, HTN, HLD, hypothyroid, chronic diastolic CHF, initially presented to the [facility] on 05/12/2023 with complaints of right first toe swelling, redness and pain which started off as an ulcer approximately a month ago and has progressively worsened.  Admitted for right diabetic foot infection (first toe cellulitis/abscess and osteomyelitis) complicating underlying poorly controlled type II DM with likely peripheral neuropathy, peripheral artery disease.  EDP discussed with orthopedics who recommended vascular surgery consultation given underlying PAD.  Discussed with Dr. Myra Gianotti, VVS on-call on 6/30, recommends transfer to [facility] for likely angiogram which at the earliest may be possible on 7/3.   Assessment & Plan:  Principal Problem:   Osteomyelitis of right lower extremity (HCC) Active Problems:   Acquired hypothyroidism   DM2 (diabetes mellitus, type 2) (HCC)   CKD stage 3b, GFR 30-44 ml/min (HCC)   Essential hypertension   HLD (hyperlipidemia)   Sepsis (HCC)   Chronic diastolic CHF (congestive heart failure) (HCC)   History of anemia due to chronic kidney disease   Diabetic foot infection (cellulitis/abscess and osteomyelitis of right first toe) - Complicating underlying poorly controlled type II DM with peripheral neuropathy and PAD. - Despite transient low-grade fever, transient mild tachycardia, minimal leukocytosis and barely meeting SIRS criteria, clinically I do not believe this is sepsis.  Lactate 1.8.   Sepsis ruled out. - X-ray of right foot: Bony erosion of the tuft of the right great toe, concerning for osteomyelitis.  Overlying soft tissue wound.  No fracture or dislocation of the right foot.  Diffuse soft tissue edema about the foot and ankle. - ESR 61, CRP 4.9. - Dopplerable bilateral dorsalis pedis. - Continue empirically started IV cefepime, vancomycin and metronidazole. - Consulted vascular surgery, discussed with Dr. Myra Gianotti 6/30 who reviewed angiogram from 5/29 and advised that right lower extremity was not adequately evaluated at that time, she will likely need right lower extremity angiogram, recommends [facility] to [facility].  Peripheral artery disease: -S/p balloon angioplasty and left superficial femoral artery stenting 5/29 -Per MAR, only taking aspirin 81 Mg daily but not Plavix.  Continue aspirin. -Defer decision to resume Plavix to vascular surgery either before or after upcoming procedure.  Stage IIIb CKD: -Baseline creatinine probably ~1.5. -Presented with creatinine of 1.77, may have an element of acute renal insufficiency versus progression of CKD. -Clinically volume overloaded.  Stopped IV fluids.  Minimize nephrotoxics.  Hold Cozaar for today. -Trend daily BMP.  Poorly controlled type II DM with peripheral neuropathy: -A1c 3/5: 12.6. -It appears that patient is on Basaglar 40 units daily.  Uptitrate Semglee to 35 units.  Added mealtime NovoLog 3 units 3 times daily. -Continue NovoLog SSI.  Monitor closely and adjust insulins as needed. -DM coordinator consultation. -Follow repeat A1c.  Essential hypertension: -Reasonable inpatient control. -Resume home dose of Procardia XL 30 Mg every evening, carvedilol 25 Mg twice daily but hold losartan.  Hyperlipidemia: - Continue simvastatin 20 Mg daily.  Hypothyroidism: - Continue levothyroxine 88 mcg daily.  Chronic diastolic CHF: - Clinically appears mildly  volume overloaded but not much respiratory symptoms.  Mostly  lower extremity edema.  BNP 215. - Continue home dose of Lasix 40 mg twice daily. - Strict intake output, daily weights.  Follow daily BMP. -Last echo in March 2021 showed LVEF of 55-60%.  Ordered repeat TTE.  Normocytic anemia: -May be her baseline.  Suspect anemia of chronic disease versus other etiologies. -Periodically trend CBCs and outpatient follow-up.  Pseudohyponatremia -Secondary to hyperglycemia.  Body mass index is 35.67 kg/m./Obesity.   ACP Documents: None present. DVT prophylaxis: SCDs Start: 05/12/23 2243.  Initiated Lovenox.   Code Status: Full Code:  Family Communication: None at bedside. Disposition:  Status is: Inpatient Remains inpatient appropriate because: IV antibiotics, likely angiogram of right lower extremity and may need intervention such as I&D or amputation of right great toe.     Consultants:   Vascular surgery  Procedures:     Antimicrobials:   As noted above   Subjective:  Patient evaluated along with her female RN in the room.  Apart from mild pain in her right great toe, some swelling of lower extremities, denies any other complaints.  Denies dyspnea, chest pain.  States that she was told by another MD last night that she was likely to lose her right first toe.  Objective:   Vitals:   05/13/23 0422 05/13/23 0500 05/13/23 0534 05/13/23 0923  BP:  (!) 161/85 (!) 149/80 (!) 141/79  Pulse:  87 91 87  Resp:  18 18 16   Temp: 98.4 F (36.9 C)  98.8 F (37.1 C) 98.9 F (37.2 C)  TempSrc: Oral  Oral Oral  SpO2:  96% 91% 91%  Weight:  103.3 kg    Height:        General exam: Middle-age female, looks older than stated age, moderately built and obese, lying comfortably propped up in bed without distress. Respiratory system: Clear to auscultation. Respiratory effort normal. Cardiovascular system: S1 & S2 heard, RRR. No JVD, murmurs, rubs, gallops or clicks. No pedal edema.  Not on telemetry. Gastrointestinal system: Abdomen is  nondistended, soft and nontender. No organomegaly or masses felt. Normal bowel sounds heard. Central nervous system: Alert and oriented. No focal neurological deficits. Extremities: Symmetric 5 x 5 power.  Bilateral legs and feet swelling but more so on the right side.  Trace to 1+ pitting edema.  Dopplerable bilateral dorsalis pedis pulsations.  Superficial ulcer dorsum of base of right first toe which is markedly swollen,?  Fluctuance with yellowish discoloration but no overt drainage.  Minimal erythema and tenderness.  Also has dark circular eschar on base of right great toe.  More details as in pictures below taken on 05/13/2023. Skin: No rashes, lesions or ulcers Psychiatry: Judgement and insight appears somewhat impaired. Mood & affect appropriate.        Data Reviewed:   I have personally reviewed following labs and imaging studies   CBC: Recent Labs  Lab 05/12/23 1920  WBC 12.3*  HGB 9.8*  HCT 31.1*  MCV 86.1  PLT 231    Basic Metabolic Panel: Recent Labs  Lab 05/12/23 1920  NA 129*  K 3.9  CL 94*  CO2 23  GLUCOSE 482*  BUN 16  CREATININE 1.77*  CALCIUM 8.6*    Liver Function Tests: No results for input(s): "AST", "ALT", "ALKPHOS", "BILITOT", "PROT", "ALBUMIN" in the last 168 hours.  CBG: Recent Labs  Lab 05/13/23 0244 05/13/23 0423 05/13/23 0802  GLUCAP 337* 306* 267*    Microbiology Studies:  Recent Results (from the past 240 hour(s))  Blood culture (routine x 2)     Status: None (Preliminary result)   Collection Time: 05/12/23  8:21 PM   Specimen: BLOOD  Result Value Ref Range Status   Specimen Description   Final    BLOOD LEFT ANTECUBITAL Performed at [facility], [address], [place], [place] [address]    Special Requests   Final    BOTTLES DRAWN AEROBIC AND ANAEROBIC Blood Culture adequate volume Performed at [facility], [address] [place], [place] [address]    Culture   Final    NO  GROWTH < 12 HOURS Performed at [facility], [address], [place], [place] [address]    Report Status PENDING  Incomplete  Blood culture (routine x 2)     Status: None (Preliminary result)   Collection Time: 05/12/23  8:42 PM   Specimen: BLOOD  Result Value Ref Range Status   Specimen Description   Final    BLOOD RIGHT ANTECUBITAL Performed at [facility], [address] [place], [place] [address]    Special Requests   Final    BOTTLES DRAWN AEROBIC AND ANAEROBIC Blood Culture adequate volume Performed at [facility], [address] [place], [place] [address]    Culture   Final    NO GROWTH < 12 HOURS Performed at [facility], [address] [place], [place] [address]    Report Status PENDING  Incomplete  Wet prep, genital     Status: None   Collection Time: 05/12/23  9:47 PM  Result Value Ref Range Status   Yeast Wet Prep HPF POC NONE SEEN NONE SEEN Final   Trich, Wet Prep NONE SEEN NONE SEEN Final   Clue Cells Wet Prep HPF POC NONE SEEN NONE SEEN Final   WBC, Wet Prep HPF POC <10 <10 Final   Sperm NONE SEEN  Final    Comment: Performed at [facility], [address], [place], [place] [address]    Radiology Studies:  DG Foot Complete Right  Result Date: 05/12/2023 CLINICAL DATA:  Great toe wound, concern for osteomyelitis EXAM: RIGHT FOOT COMPLETE - 3+ VIEW COMPARISON:  None Available. FINDINGS: No fracture or dislocation of the right foot. Mild arthrosis. Bony erosion of the tuft of the right great toe. Overlying soft tissue wound. Diffuse soft tissue edema about the foot and ankle. IMPRESSION: 1. Bony erosion of the tuft of the right great toe, concerning for osteomyelitis. Overlying soft tissue wound. 2. No fracture or dislocation of the right foot. 3. Diffuse soft tissue edema about the foot and ankle. Electronically Signed   By: Jearld Lesch M.D.   On: 05/12/2023 21:10    Scheduled Meds:     aspirin EC  81 mg Oral Daily   carvedilol  25 mg Oral BID WC   furosemide  40 mg Oral BID   insulin aspart  0-15 Units Subcutaneous TID WC   insulin aspart  0-5 Units Subcutaneous QHS   insulin aspart  3 Units Subcutaneous TID WC   insulin glargine-yfgn  35 Units Subcutaneous Daily   levothyroxine  88 mcg Oral Daily   NIFEdipine  30 mg Oral QPM   potassium chloride  10 mEq Oral Daily   simvastatin  20 mg Oral QPM    Continuous Infusions:    ceFEPime (MAXIPIME) IV 2 g (05/13/23 0836)   metronidazole 500 mg (05/13/23  1027)   [START ON 05/15/2023] vancomycin       LOS: 1 day     Marcellus Scott, MD,  FACP, [facility], [facility], [facility], [facility]   [facility] [place]     To contact the attending provider between 7A-7P or the covering provider during after hours 7P-7A, please log into the web site [email or web address] and access using universal [place] password for that web site. If you do not have the password, please call the hospital operator.  05/13/2023, 10:37 AM

## 2023-05-14 ENCOUNTER — Ambulatory Visit: Payer: Medicare HMO | Admitting: Family Medicine

## 2023-05-14 ENCOUNTER — Inpatient Hospital Stay (HOSPITAL_COMMUNITY): Payer: Medicare HMO

## 2023-05-14 ENCOUNTER — Telehealth: Payer: Self-pay | Admitting: Family Medicine

## 2023-05-14 DIAGNOSIS — I5032 Chronic diastolic (congestive) heart failure: Secondary | ICD-10-CM | POA: Diagnosis not present

## 2023-05-14 DIAGNOSIS — M869 Osteomyelitis, unspecified: Secondary | ICD-10-CM | POA: Diagnosis not present

## 2023-05-14 LAB — BASIC METABOLIC PANEL
Anion gap: 10 (ref 5–15)
BUN: 13 mg/dL (ref 8–23)
CO2: 24 mmol/L (ref 22–32)
Calcium: 8.5 mg/dL — ABNORMAL LOW (ref 8.9–10.3)
Chloride: 99 mmol/L (ref 98–111)
Creatinine, Ser: 1.33 mg/dL — ABNORMAL HIGH (ref 0.44–1.00)
GFR, Estimated: 44 mL/min — ABNORMAL LOW (ref >60)
Glucose, Bld: 213 mg/dL — ABNORMAL HIGH (ref 70–99)
Potassium: 3.3 mmol/L — ABNORMAL LOW (ref 3.5–5.1)
Sodium: 133 mmol/L — ABNORMAL LOW (ref 135–145)

## 2023-05-14 LAB — BLOOD CULTURE ID PANEL (REFLEXED) - BCID2

## 2023-05-14 LAB — CBC
HCT: 29.4 % — ABNORMAL LOW (ref 36.0–46.0)
Hemoglobin: 9.1 g/dL — ABNORMAL LOW (ref 12.0–15.0)
MCH: 26.3 pg (ref 26.0–34.0)
MCHC: 31 g/dL (ref 30.0–36.0)
MCV: 85 fL (ref 80.0–100.0)
Platelets: 211 10*3/uL (ref 150–400)
RBC: 3.46 MIL/uL — ABNORMAL LOW (ref 3.87–5.11)
RDW: 14.6 % (ref 11.5–15.5)
WBC: 9.1 10*3/uL (ref 4.0–10.5)
nRBC: 0 % (ref 0.0–0.2)

## 2023-05-14 LAB — ECHOCARDIOGRAM COMPLETE
AR max vel: 2.93 cm2
AV Peak grad: 10.1 mmHg
Ao pk vel: 1.59 m/s
Area-P 1/2: 3.65 cm2
Height: 67 in
S' Lateral: 2.8 cm
Weight: 3545 oz

## 2023-05-14 LAB — GLUCOSE, CAPILLARY
Glucose-Capillary: 218 mg/dL — ABNORMAL HIGH (ref 70–99)
Glucose-Capillary: 303 mg/dL — ABNORMAL HIGH (ref 70–99)
Glucose-Capillary: 222 mg/dL — ABNORMAL HIGH (ref 70–99)
Glucose-Capillary: 282 mg/dL — ABNORMAL HIGH (ref 70–99)

## 2023-05-14 LAB — HEMOGLOBIN A1C
Hgb A1c MFr Bld: 12.2 % — ABNORMAL HIGH (ref 4.8–5.6)
Mean Plasma Glucose: 303 mg/dL

## 2023-05-14 MED ORDER — LIVING WELL WITH DIABETES BOOK
Freq: Once | Status: AC
  Filled 2023-05-14: qty 1

## 2023-05-14 MED ORDER — TRAZODONE HCL 50 MG PO TABS: 50.0000 mg | ORAL_TABLET | Freq: Every evening | ORAL | Status: DC | PRN

## 2023-05-14 MED ORDER — GUAIFENESIN 100 MG/5ML PO LIQD: 5.0000 mL | ORAL | Status: DC | PRN

## 2023-05-14 MED ORDER — IPRATROPIUM-ALBUTEROL 0.5-2.5 (3) MG/3ML IN SOLN: 3.0000 mL | RESPIRATORY_TRACT | Status: DC | PRN

## 2023-05-14 MED ORDER — HYDRALAZINE HCL 20 MG/ML IJ SOLN: 10.0000 mg | INTRAMUSCULAR | Status: DC | PRN

## 2023-05-14 MED ORDER — SENNOSIDES-DOCUSATE SODIUM 8.6-50 MG PO TABS: 1.0000 | ORAL_TABLET | Freq: Every evening | ORAL | Status: DC | PRN

## 2023-05-14 MED ORDER — VANCOMYCIN HCL IN DEXTROSE 1-5 GM/200ML-% IV SOLN
1000.0000 mg | INTRAVENOUS | Status: DC
  Administered 2023-05-14: 1000 mg via INTRAVENOUS
  Filled 2023-05-14 (×2): qty 200

## 2023-05-14 MED ORDER — SODIUM CHLORIDE 0.9 % IV SOLN: INTRAVENOUS | Status: DC

## 2023-05-14 MED ORDER — POTASSIUM CHLORIDE 10 MEQ/100ML IV SOLN
10.0000 meq | INTRAVENOUS | Status: AC
  Administered 2023-05-14 (×4): 10 meq via INTRAVENOUS
  Filled 2023-05-14 (×4): qty 100

## 2023-05-14 MED ORDER — METOPROLOL TARTRATE 5 MG/5ML IV SOLN: 5.0000 mg | INTRAVENOUS | Status: DC | PRN

## 2023-05-14 NOTE — Progress Notes (Signed)
Echocardiogram 2D Echocardiogram has been performed.  Lucendia Herrlich 05/14/2023, 2:47 PM

## 2023-05-14 NOTE — Inpatient Diabetes Management (Addendum)
Inpatient Diabetes Program Recommendations  AACE/ADA: New Consensus Statement on Inpatient Glycemic Control (2015)  Target Ranges:  Prepandial:   less than 140 mg/dL      Peak postprandial:   less than 180 mg/dL (1-2 hours)      Critically ill patients:  140 - 180 mg/dL   Lab Results  Component Value Date   GLUCAP 218 (H) 05/14/2023   HGBA1C 12.2 (H) 05/13/2023    Review of Glycemic Control  Latest Reference Range & Units 05/13/23 08:02 05/13/23 13:25 05/13/23 16:22 05/13/23 21:18 05/14/23 06:10  Glucose-Capillary 70 - 99 mg/dL 409 (H) 811 (H) 914 (H) 214 (H) 218 (H)  (H): Data is abnormally high  Diabetes history: DM2 Outpatient Diabetes medications: Basaglar 40 units every day, Novolog 0-6 units TID Current orders for Inpatient glycemic control: Semglee 35 units every day, Novolog 0-15 units TID and 0-5 units at bedtime  Inpatient DM recommendations:  Semglee 40 units QD  Spoke with patient at bedside.  Reviewed patient's current A1c of 12.2% (average BG of 303 mg.dL) Explained what a N8G is and what it measures. Also reviewed goal A1c with patient, importance of good glucose control @ home, and blood sugar goals.  She does not skip insulin doses and denies difficulty obtaining medications.  She checks her CBG BID.  She admits to drinking beverages with sugar such as Power Aid & sweet tea.  Encouraged her to avoid caloric beverages.  Educated on The Plate Method, CHO's, portion control, CBGs at home fasting and mid afternoon, F/U with PCP every 3 months, bring meter to PCP office, long and short term complications of uncontrolled BG, and importance of exercise.  Ordered the Living Well with Diabetes booklet.  She verbalizes understanding.  Will continue to follow while inpatient.  Thank you, Dulce Sellar, MSN, CDCES Diabetes Coordinator Inpatient Diabetes Program [phone] (team pager from 8a-5p)

## 2023-05-14 NOTE — Progress Notes (Addendum)
Pharmacy Antibiotic Note  Danielle Schroeder is a 67 y.o. female admitted on 05/12/2023 with medical history significant for pituitary adenoma, acromegaly, chronic venous insufficiency bilateral lower extremities, as well as arterial insufficiency, diabetes, diabetic retinopathy who presents with concern for elevated blood sugar, vaginal itching, patient with notable wound to her right great toe on arrival. Pharmacy has been consulted to dose vancomycin and cefepime for DFI with right great toe osteomyelitis.  Renal function has improved, SCr down 1.33 (baseline ~1.3-1.5). She is afebrile, WBC are normal. Blood cultures with 1 of 4 bottles with GPC chain - possible contaminant.   Plan: Increase vancomycin to 1000 mg IV q24h (AUC 517.7, Scr 1.33) Continue cefepime 2 g IV q12h Monitor renal function, clinical progress, cultures/sensitivities F/U LOT and de-escalate as able Vancomycin levels as clinically indicated   Height: 5\' 7"  (170.2 cm) Weight: 100.5 kg (221 lb 9 oz) IBW/kg (Calculated) : 61.6  Temp (24hrs), Avg:98.4 F (36.9 C), Min:98.1 F (36.7 C), Max:98.9 F (37.2 C)  Recent Labs  Lab 05/12/23 1920 05/12/23 2021 05/13/23 1051 05/14/23 0127  WBC 12.3*  --  9.8 9.1  CREATININE 1.77*  --  1.36* 1.33*  LATICACIDVEN  --  1.8  --   --      Estimated Creatinine Clearance: 51.4 mL/min (A) (by C-G formula based on SCr of 1.33 mg/dL (H)).    No Known Allergies  Antimicrobials this admission: cefepime 6/29 >> vancomycin 6/30 >> metronidazole 6/30 >>  Dose adjustments this admission:   Microbiology results: 6/29 BCx: 1/4 bottles GPC chains   Thank you for involving pharmacy in this patient's care.  Loura Back, PharmD, BCPS Clinical Pharmacist Clinical phone for 05/14/2023 is [phone] 05/14/2023 7:52 AM

## 2023-05-14 NOTE — Progress Notes (Signed)
Chaplain provided AD education. Patient states she wants her daughter to be HCPOA. She is unsure if she wants to complete the paperwork at this time. Patient understands that these are decisions about her "body" and not her finances.   Arlyce Dice, Chaplain Resident [phone]

## 2023-05-14 NOTE — Progress Notes (Signed)
PHARMACY - PHYSICIAN COMMUNICATION CRITICAL VALUE ALERT - BLOOD CULTURE IDENTIFICATION (BCID)  Danielle Schroeder is an 67 y.o. female who presented to [facility] on 05/12/2023 with a chief complaint of diabetic foot infection, concern for osteo   Name of physician (or Provider) Contacted: Dr. Antionette Char  Current antibiotics: Vancomycin, Cefepime, Flagyl  Changes to prescribed antibiotics recommended:  No changes for now  Results for orders placed or performed during the hospital encounter of 05/12/23  Blood Culture ID Panel (Reflexed) (Collected: 05/12/2023  8:42 PM)  Result Value Ref Range   Enterococcus faecalis NOT DETECTED NOT DETECTED   Enterococcus Faecium NOT DETECTED NOT DETECTED   Listeria monocytogenes NOT DETECTED NOT DETECTED   Staphylococcus species NOT DETECTED NOT DETECTED   Staphylococcus aureus (BCID) NOT DETECTED NOT DETECTED   Staphylococcus epidermidis NOT DETECTED NOT DETECTED   Staphylococcus lugdunensis NOT DETECTED NOT DETECTED   Streptococcus species DETECTED (A) NOT DETECTED   Streptococcus agalactiae NOT DETECTED NOT DETECTED   Streptococcus pneumoniae NOT DETECTED NOT DETECTED   Streptococcus pyogenes NOT DETECTED NOT DETECTED   A.calcoaceticus-baumannii NOT DETECTED NOT DETECTED   Bacteroides fragilis NOT DETECTED NOT DETECTED   Enterobacterales NOT DETECTED NOT DETECTED   Enterobacter cloacae complex NOT DETECTED NOT DETECTED   Escherichia coli NOT DETECTED NOT DETECTED   Klebsiella aerogenes NOT DETECTED NOT DETECTED   Klebsiella oxytoca NOT DETECTED NOT DETECTED   Klebsiella pneumoniae NOT DETECTED NOT DETECTED   Proteus species NOT DETECTED NOT DETECTED   Salmonella species NOT DETECTED NOT DETECTED   Serratia marcescens NOT DETECTED NOT DETECTED   Haemophilus influenzae NOT DETECTED NOT DETECTED   Neisseria meningitidis NOT DETECTED NOT DETECTED   Pseudomonas aeruginosa NOT DETECTED NOT DETECTED   Stenotrophomonas maltophilia NOT DETECTED NOT  DETECTED   Candida albicans NOT DETECTED NOT DETECTED   Candida auris NOT DETECTED NOT DETECTED   Candida glabrata NOT DETECTED NOT DETECTED   Candida krusei NOT DETECTED NOT DETECTED   Candida parapsilosis NOT DETECTED NOT DETECTED   Candida tropicalis NOT DETECTED NOT DETECTED   Cryptococcus neoformans/gattii NOT DETECTED NOT DETECTED    Abran Duke 05/14/2023  4:45 AM

## 2023-05-14 NOTE — Evaluation (Signed)
Physical Therapy Evaluation Patient Details Name: Danielle Schroeder MRN: [id number] DOB: 06-16-1956 Today's Date: 05/14/2023  History of Present Illness  Pt is 67 year old presented to [facility] on  05/12/23 for sepsis due to rt great toe osteomyelitis. Pt transferred to [facility] on 05/13/23 for pending vascular intervention. PMH - DM, PVD, chf, hypothyroid, htn, LE angioplasty and stenting, ckd  Clinical Impression  Pt presents to PT currently mobilizing well using her cane and she practiced and did well with rolling walker today. Anticipate some intervention on rt great toe soon that will impact her mobility. Will see how she does after that prior to recommending equipment or follow up.         Assistance Recommended at Discharge Intermittent Supervision/Assistance  If plan is discharge home, recommend the following:  Can travel by private vehicle  Help with stairs or ramp for entrance;Assist for transportation;A little help with bathing/dressing/bathroom;Assistance with cooking/housework        Equipment Recommendations Other (comment) (To be determined)  Recommendations for Other Services       Functional Status Assessment Patient has had a recent decline in their functional status and demonstrates the ability to make significant improvements in function in a reasonable and predictable amount of time.     Precautions / Restrictions Precautions Precautions: None Restrictions Weight Bearing Restrictions: No      Mobility  Bed Mobility               General bed mobility comments: Pt sitting EOB    Transfers Overall transfer level: Needs assistance Equipment used: Rolling walker (2 wheels), Straight cane Transfers: Sit to/from Stand Sit to Stand: Supervision           General transfer comment: for safety/lines    Ambulation/Gait Ambulation/Gait assistance: Supervision Gait Distance (Feet): 250 Feet Assistive device: Rolling walker (2 wheels), Straight cane Gait  Pattern/deviations: Step-through pattern, Decreased stance time - right, Decreased stride length Gait velocity: decr Gait velocity interpretation: 1.31 - 2.62 ft/sec, indicative of limited community ambulator   General Gait Details: Assist for safety. Initial instruction for use of walker. Used walker due to impending intervention on rt foot. Used cane in room  Stairs            Wheelchair Mobility     Tilt Bed    Modified Rankin (Stroke Patients Only)       Balance Overall balance assessment: Mild deficits observed, not formally tested                                           Pertinent Vitals/Pain Pain Assessment Pain Assessment: No/denies pain    Home Living Family/patient expects to be discharged to:: Private residence Living Arrangements: Alone   Type of Home: House Home Access: Stairs to enter   Secretary/administrator of Steps: 1   Home Layout: One level Home Equipment: Cane - single point      Prior Function Prior Level of Function : Independent/Modified Independent;Driving             Mobility Comments: Uses cane       Hand Dominance   Dominant Hand: Right    Extremity/Trunk Assessment   Upper Extremity Assessment Upper Extremity Assessment: Defer to OT evaluation    Lower Extremity Assessment Lower Extremity Assessment: RLE deficits/detail;LLE deficits/detail RLE Sensation: history of peripheral neuropathy;decreased light touch LLE Sensation: history of  peripheral neuropathy;decreased light touch       Communication   Communication: No difficulties  Cognition Arousal/Alertness: Awake/alert Behavior During Therapy: WFL for tasks assessed/performed Overall Cognitive Status: Within Functional Limits for tasks assessed                                          General Comments      Exercises     Assessment/Plan    PT Assessment Patient needs continued PT services  PT Problem List Decreased  mobility;Decreased knowledge of use of DME;Impaired sensation       PT Treatment Interventions DME instruction;Gait training;Stair training;Functional mobility training;Therapeutic activities;Therapeutic exercise;Balance training;Patient/family education    PT Goals (Current goals can be found in the Care Plan section)  Acute Rehab PT Goals Patient Stated Goal: Return home PT Goal Formulation: With patient Time For Goal Achievement: 05/28/23 Potential to Achieve Goals: Good    Frequency Min 1X/week     Co-evaluation               AM-PAC PT "6 Clicks" Mobility  Outcome Measure Help needed turning from your back to your side while in a flat bed without using bedrails?: None Help needed moving from lying on your back to sitting on the side of a flat bed without using bedrails?: None Help needed moving to and from a bed to a chair (including a wheelchair)?: A Little Help needed standing up from a chair using your arms (e.g., wheelchair or bedside chair)?: A Little Help needed to walk in hospital room?: A Little Help needed climbing 3-5 steps with a railing? : A Little 6 Click Score: 20    End of Session   Activity Tolerance: Patient tolerated treatment well Patient left: in bed;with call bell/phone within reach (sitting EOB)   PT Visit Diagnosis: Other abnormalities of gait and mobility (R26.89)    Time: 1610-9604 PT Time Calculation (min) (ACUTE ONLY): 20 min   Charges:   PT Evaluation $PT Eval Low Complexity: 1 Low   PT General Charges $$ ACUTE PT VISIT: 1 Visit         [facility] PT Acute Rehabilitation Services Office [phone]   Angelina Ok [facility] 05/14/2023, 5:11 PM

## 2023-05-14 NOTE — Progress Notes (Signed)
PROGRESS NOTE    Danielle Schroeder  BJY:[id number] DOB: [birth year] DOA: 05/12/2023 PCP: [name], MD   Brief Narrative:  67 year old female with medical history significant for type II DM/IDDM, PAD with balloon angioplasty and left superficial femoral artery stenting 04/11/2023 by Dr. Nolon Bussing, HTN, HLD, hypothyroid, chronic diastolic CHF, initially presented to the [facility] on 05/12/2023 with complaints of right first toe swelling, redness and pain which started off as an ulcer approximately a month ago and has progressively worsened. Admitted for right diabetic foot infection (first toe cellulitis/abscess and osteomyelitis) complicating underlying poorly controlled type II DM with likely peripheral neuropathy, peripheral artery disease. EDP discussed with orthopedics who recommended vascular surgery consultation given underlying PAD. Discussed with Dr. Myra Gianotti, VVS on-call on 6/30, recommends transfer to [facility] for likely angiogram which at the earliest may be possible on 7/3.    Assessment & Plan:  Principal Problem:   Osteomyelitis of right lower extremity (HCC) Active Problems:   Acquired hypothyroidism   DM2 (diabetes mellitus, type 2) (HCC)   CKD stage 3b, GFR 30-44 ml/min (HCC)   Essential hypertension   HLD (hyperlipidemia)   Sepsis (HCC)   Chronic diastolic CHF (congestive heart failure) (HCC)   History of anemia due to chronic kidney disease       Diabetic foot infection (cellulitis/abscess and osteomyelitis of right first toe) History of peripheral vascular disease status post balloon angioplasty and stenting of left superficial femoral artery 5/29 -Complicated by DM2, peripheral neuropathy and PAD.  X-ray showing concerns of right great toe osteo.  Seen by vascular, planning on right lower extremity angiogram with possible right great toe amputation on 7/3 - Aspirin, statin, empiric vancomycin, cefepime and Flagyl.    Stage IIIb CKD: Creatinine is  stable around 1.33   Poorly controlled type II DM with peripheral neuropathy: -A1c 12.2.  Sliding scale and Accu-Chek.  Adjust insulin as necessary   Essential hypertension: -Losartan on hold - Continue Procardia, Coreg.  IV as needed   Hyperlipidemia: -Daily Zocor   Hypothyroidism: - Continue levothyroxine 88 mcg daily.   Chronic diastolic CHF: EF 08% Echo in March 2021 showed EF of 55%.  Repeat echocardiogram - Continue aspirin, statin   Normocytic anemia: Hemoglobin is around baseline of 9.0   Pseudohyponatremia -Secondary to hyperglycemia.   Body mass index is 35.67 kg/m./Obesity.      DVT prophylaxis: Lovenox Code Status: Full Family Communication:   Status is: Inpatient Remains inpatient appropriate because: Cont hospital stay for Angio on Wednesday   Pressure Injury 05/13/23 Heel Left;Right Stage 2 -  Partial thickness loss of dermis presenting as a shallow open injury with a red, pink wound bed without slough. (Active)  05/13/23 1930  Location: Heel  Location Orientation: Left;Right  Staging: Stage 2 -  Partial thickness loss of dermis presenting as a shallow open injury with a red, pink wound bed without slough.  Wound Description (Comments):   Present on Admission: Yes     Diet Orders (From admission, onward)     Start     Ordered   05/16/23 0000  Diet NPO time specified Except for: Sips with Meds  Diet effective now       Question:  Except for  Answer:  Clearance Coots with Meds   05/14/23 0757   05/13/23 1031  Diet heart healthy/carb modified Room service appropriate? Yes; Fluid consistency: Thin; Fluid restriction: 1800 mL Fluid  Diet effective now       Question Answer Comment  Diet-HS Snack? Nothing   Room service appropriate? Yes   Fluid consistency: Thin   Fluid restriction: 1800 mL Fluid      05/13/23 1031            Subjective: Doing ok no complaints.     Examination:  General exam: Appears calm and comfortable  Respiratory system:  Clear to auscultation. Respiratory effort normal. Cardiovascular system: S1 & S2 heard, RRR. No JVD, murmurs, rubs, gallops or clicks. No pedal edema. Gastrointestinal system: Abdomen is nondistended, soft and nontender. No organomegaly or masses felt. Normal bowel sounds heard. Central nervous system: Alert and oriented. No focal neurological deficits. Extremities: Symmetric 5 x 5 power. Skin: No rashes, lesions or ulcers Psychiatry: Judgement and insight appear normal. Mood & affect appropriate.  Objective: Vitals:   05/13/23 2349 05/14/23 0357 05/14/23 0500 05/14/23 0836  BP: 133/75 135/70  93/69  Pulse: 83 82  93  Resp: 20 20  20   Temp:    97.8 F (36.6 C)  TempSrc:    Oral  SpO2: 92% 90%  96%  Weight:   100.5 kg   Height:        Intake/Output Summary (Last 24 hours) at 05/14/2023 0837 Last data filed at 05/14/2023 0803 Gross per 24 hour  Intake 992.37 ml  Output 1750 ml  Net -757.63 ml   Filed Weights   05/12/23 1914 05/13/23 0500 05/14/23 0500  Weight: 102 kg 103.3 kg 100.5 kg    Scheduled Meds:  aspirin EC  81 mg Oral Daily   carvedilol  25 mg Oral BID WC   enoxaparin (LOVENOX) injection  50 mg Subcutaneous Q24H   furosemide  40 mg Oral BID   insulin aspart  0-15 Units Subcutaneous TID WC   insulin aspart  0-5 Units Subcutaneous QHS   insulin aspart  3 Units Subcutaneous TID WC   insulin glargine-yfgn  35 Units Subcutaneous Daily   levothyroxine  88 mcg Oral Daily   NIFEdipine  30 mg Oral QPM   potassium chloride  10 mEq Oral Daily   simvastatin  20 mg Oral QPM   Continuous Infusions:  ceFEPime (MAXIPIME) IV 2 g (05/13/23 2126)   metronidazole 500 mg (05/13/23 2159)   vancomycin      Nutritional status     Body mass index is 34.7 kg/m.  Data Reviewed:   CBC: Recent Labs  Lab 05/12/23 1920 05/13/23 1051 05/14/23 0127  WBC 12.3* 9.8 9.1  NEUTROABS  --  8.3*  --   HGB 9.8* 8.8* 9.1*  HCT 31.1* 28.2* 29.4*  MCV 86.1 86.2 85.0  PLT 231 200 211    Basic Metabolic Panel: Recent Labs  Lab 05/12/23 1920 05/13/23 1051 05/14/23 0127  NA 129* 132* 133*  K 3.9 3.5 3.3*  CL 94* 99 99  CO2 23 25 24   GLUCOSE 482* 317* 213*  BUN 16 15 13   CREATININE 1.77* 1.36* 1.33*  CALCIUM 8.6* 8.7* 8.5*  MG  --  1.7  --    GFR: Estimated Creatinine Clearance: 51.4 mL/min (A) (by C-G formula based on SCr of 1.33 mg/dL (H)). Liver Function Tests: Recent Labs  Lab 05/13/23 1051  AST 13*  ALT 11  ALKPHOS 107  BILITOT 0.9  PROT 6.4*  ALBUMIN 2.4*   No results for input(s): "LIPASE", "AMYLASE" in the last 168 hours. No results for input(s): "AMMONIA" in the last 168 hours. Coagulation Profile: Recent Labs  Lab 05/13/23 1051  INR 1.2   Cardiac Enzymes: No  results for input(s): "CKTOTAL", "CKMB", "CKMBINDEX", "TROPONINI" in the last 168 hours. BNP (last 3 results) No results for input(s): "PROBNP" in the last 8760 hours. HbA1C: No results for input(s): "HGBA1C" in the last 72 hours. CBG: Recent Labs  Lab 05/13/23 0802 05/13/23 1325 05/13/23 1622 05/13/23 2118 05/14/23 0610  GLUCAP 267* 306* 291* 214* 218*   Lipid Profile: No results for input(s): "CHOL", "HDL", "LDLCALC", "TRIG", "CHOLHDL", "LDLDIRECT" in the last 72 hours. Thyroid Function Tests: No results for input(s): "TSH", "T4TOTAL", "FREET4", "T3FREE", "THYROIDAB" in the last 72 hours. Anemia Panel: No results for input(s): "VITAMINB12", "FOLATE", "FERRITIN", "TIBC", "IRON", "RETICCTPCT" in the last 72 hours. Sepsis Labs: Recent Labs  Lab 05/12/23 2021  LATICACIDVEN 1.8    Recent Results (from the past 240 hour(s))  Blood culture (routine x 2)     Status: None (Preliminary result)   Collection Time: 05/12/23  8:21 PM   Specimen: BLOOD  Result Value Ref Range Status   Specimen Description   Final    BLOOD LEFT ANTECUBITAL Performed at [facility], [address] [place], [place] [address]    Special Requests   Final    BOTTLES DRAWN  AEROBIC AND ANAEROBIC Blood Culture adequate volume Performed at [facility], [address] [place], [place] [address]    Culture   Final    NO GROWTH < 12 HOURS Performed at [facility], [address] [place], [place] [address]    Report Status PENDING  Incomplete  Blood culture (routine x 2)     Status: None (Preliminary result)   Collection Time: 05/12/23  8:42 PM   Specimen: BLOOD  Result Value Ref Range Status   Specimen Description   Final    BLOOD RIGHT ANTECUBITAL Performed at [facility], [address] [place], [place] [address]    Special Requests   Final    BOTTLES DRAWN AEROBIC AND ANAEROBIC Blood Culture adequate volume Performed at [facility], [address] [place], [place] [address]    Culture  Setup Time   Final    GRAM POSITIVE COCCI IN CHAINS AEROBIC BOTTLE ONLY CRITICAL RESULT CALLED TO, READ BACK BY AND VERIFIED WITH: PHARMD J. LEDFORD 05/14/23 @ 0425 BY AB Performed at [facility], [address] [place], [place] [address]    Culture GRAM POSITIVE COCCI  Final   Report Status PENDING  Incomplete  Blood Culture ID Panel (Reflexed)     Status: Abnormal   Collection Time: 05/12/23  8:42 PM  Result Value Ref Range Status   Enterococcus faecalis NOT DETECTED NOT DETECTED Final   Enterococcus Faecium NOT DETECTED NOT DETECTED Final   Listeria monocytogenes NOT DETECTED NOT DETECTED Final   Staphylococcus species NOT DETECTED NOT DETECTED Final   Staphylococcus aureus (BCID) NOT DETECTED NOT DETECTED Final   Staphylococcus epidermidis NOT DETECTED NOT DETECTED Final   Staphylococcus lugdunensis NOT DETECTED NOT DETECTED Final   Streptococcus species DETECTED (A) NOT DETECTED Final    Comment: Not Enterococcus species, Streptococcus agalactiae, Streptococcus pyogenes, or Streptococcus pneumoniae. CRITICAL RESULT CALLED TO, READ BACK BY AND VERIFIED WITH: PHARMD J. LEDFORD 05/14/23 @  0425 BY AB    Streptococcus agalactiae NOT DETECTED NOT DETECTED Final   Streptococcus pneumoniae NOT DETECTED NOT DETECTED Final   Streptococcus pyogenes NOT DETECTED NOT DETECTED Final   A.calcoaceticus-baumannii NOT DETECTED NOT DETECTED Final   Bacteroides fragilis NOT DETECTED NOT DETECTED Final   Enterobacterales  NOT DETECTED NOT DETECTED Final   Enterobacter cloacae complex NOT DETECTED NOT DETECTED Final   Escherichia coli NOT DETECTED NOT DETECTED Final   Klebsiella aerogenes NOT DETECTED NOT DETECTED Final   Klebsiella oxytoca NOT DETECTED NOT DETECTED Final   Klebsiella pneumoniae NOT DETECTED NOT DETECTED Final   Proteus species NOT DETECTED NOT DETECTED Final   Salmonella species NOT DETECTED NOT DETECTED Final   Serratia marcescens NOT DETECTED NOT DETECTED Final   Haemophilus influenzae NOT DETECTED NOT DETECTED Final   Neisseria meningitidis NOT DETECTED NOT DETECTED Final   Pseudomonas aeruginosa NOT DETECTED NOT DETECTED Final   Stenotrophomonas maltophilia NOT DETECTED NOT DETECTED Final   Candida albicans NOT DETECTED NOT DETECTED Final   Candida auris NOT DETECTED NOT DETECTED Final   Candida glabrata NOT DETECTED NOT DETECTED Final   Candida krusei NOT DETECTED NOT DETECTED Final   Candida parapsilosis NOT DETECTED NOT DETECTED Final   Candida tropicalis NOT DETECTED NOT DETECTED Final   Cryptococcus neoformans/gattii NOT DETECTED NOT DETECTED Final    Comment: Performed at [facility], [address] [place], [place] [address]  Wet prep, genital     Status: None   Collection Time: 05/12/23  9:47 PM  Result Value Ref Range Status   Yeast Wet Prep HPF POC NONE SEEN NONE SEEN Final   Trich, Wet Prep NONE SEEN NONE SEEN Final   Clue Cells Wet Prep HPF POC NONE SEEN NONE SEEN Final   WBC, Wet Prep HPF POC <10 <10 Final   Sperm NONE SEEN  Final    Comment: Performed at [facility], [address], [place], [place] [address]          Radiology Studies: DG Foot Complete Right  Result Date: 05/12/2023 CLINICAL DATA:  Great toe wound, concern for osteomyelitis EXAM: RIGHT FOOT COMPLETE - 3+ VIEW COMPARISON:  None Available. FINDINGS: No fracture or dislocation of the right foot. Mild arthrosis. Bony erosion of the tuft of the right great toe. Overlying soft tissue wound. Diffuse soft tissue edema about the foot and ankle. IMPRESSION: 1. Bony erosion of the tuft of the right great toe, concerning for osteomyelitis. Overlying soft tissue wound. 2. No fracture or dislocation of the right foot. 3. Diffuse soft tissue edema about the foot and ankle. Electronically Signed   By: Jearld Lesch M.D.   On: 05/12/2023 21:10           LOS: 2 days   Time spent= 35 mins    Fatisha Rabalais Joline Maxcy, MD [facility]  If 7PM-7AM, please contact night-coverage  05/14/2023, 8:37 AM

## 2023-05-14 NOTE — Progress Notes (Addendum)
  Progress Note    05/14/2023 7:41 AM * No surgery found *  Subjective:  no complaints    Vitals:   05/13/23 2349 05/14/23 0357  BP: 133/75 135/70  Pulse: 83 82  Resp: 20 20  Temp:    SpO2: 92% 90%    Physical Exam: General:  sitting up on the side of the bed eating breakfast Cardiac:  regular Lungs:  nonlabored Extremities:  R GT ulceration unchanged   CBC    Component Value Date/Time   WBC 9.1 05/14/2023 0127   RBC 3.46 (L) 05/14/2023 0127   HGB 9.1 (L) 05/14/2023 0127   HGB 10.7 (L) 03/30/2020 1209   HCT 29.4 (L) 05/14/2023 0127   HCT 32.4 (L) 03/30/2020 1209   PLT 211 05/14/2023 0127   PLT 188 03/30/2020 1209   MCV 85.0 05/14/2023 0127   MCV 86 03/30/2020 1209   MCH 26.3 05/14/2023 0127   MCHC 31.0 05/14/2023 0127   RDW 14.6 05/14/2023 0127   RDW 13.9 03/30/2020 1209   LYMPHSABS 0.8 05/13/2023 1051   MONOABS 0.7 05/13/2023 1051   EOSABS 0.1 05/13/2023 1051   BASOSABS 0.0 05/13/2023 1051    BMET    Component Value Date/Time   NA 133 (L) 05/14/2023 0127   NA 142 03/30/2020 1209   K 3.3 (L) 05/14/2023 0127   CL 99 05/14/2023 0127   CO2 24 05/14/2023 0127   GLUCOSE 213 (H) 05/14/2023 0127   BUN 13 05/14/2023 0127   BUN 26 03/30/2020 1209   CREATININE 1.33 (H) 05/14/2023 0127   CREATININE 1.44 (H) 05/06/2020 0946   CALCIUM 8.5 (L) 05/14/2023 0127   GFRNONAA 44 (L) 05/14/2023 0127   GFRNONAA 39 (L) 05/06/2020 0946   GFRAA 45 (L) 05/06/2020 0946    INR    Component Value Date/Time   INR 1.2 05/13/2023 1051     Intake/Output Summary (Last 24 hours) at 05/14/2023 0741 Last data filed at 05/14/2023 0524 Gross per 24 hour  Intake 752.37 ml  Output 1750 ml  Net -997.63 ml      Assessment/Plan:  67 y.o. female with right GT osteomyelitis   -No changes to right GT ulceration. No pain in right foot -Plans for RLE angiogram this Wednesday with Dr.Amin Fornwalt, the patient is agreeable -Patient is aware she will likely need at least right GT  amputation, potentially a more proximal amputation pending angiogram results -Continue ASA, statin, and IV abx   Loel Dubonnet, PA-C [facility] [phone] 05/14/2023 7:41 AM  VASCULAR STAFF ADDENDUM: I have independently interviewed and examined the patient. I agree with the above.  Pt NPO for possible intervention this afternoon  Fara Olden, MD [facility] Phone Number: [phone] 05/14/2023 11:36 AM

## 2023-05-14 NOTE — Telephone Encounter (Signed)
Pt NS on 7/1 no reason available letter sent 

## 2023-05-15 DIAGNOSIS — M869 Osteomyelitis, unspecified: Secondary | ICD-10-CM | POA: Diagnosis not present

## 2023-05-15 DIAGNOSIS — I70235 Atherosclerosis of native arteries of right leg with ulceration of other part of foot: Secondary | ICD-10-CM | POA: Diagnosis not present

## 2023-05-15 LAB — CBC
HCT: 30 % — ABNORMAL LOW (ref 36.0–46.0)
Hemoglobin: 9.4 g/dL — ABNORMAL LOW (ref 12.0–15.0)
MCH: 27.2 pg (ref 26.0–34.0)
MCHC: 31.3 g/dL (ref 30.0–36.0)
MCV: 86.7 fL (ref 80.0–100.0)
Platelets: 228 10*3/uL (ref 150–400)
RBC: 3.46 MIL/uL — ABNORMAL LOW (ref 3.87–5.11)
RDW: 14.8 % (ref 11.5–15.5)
WBC: 7.6 10*3/uL (ref 4.0–10.5)
nRBC: 0 % (ref 0.0–0.2)

## 2023-05-15 LAB — CULTURE, BLOOD (ROUTINE X 2)
Culture: NO GROWTH
Special Requests: ADEQUATE

## 2023-05-15 LAB — BASIC METABOLIC PANEL
Anion gap: 10 (ref 5–15)
BUN: 16 mg/dL (ref 8–23)
CO2: 25 mmol/L (ref 22–32)
Calcium: 8.7 mg/dL — ABNORMAL LOW (ref 8.9–10.3)
Chloride: 101 mmol/L (ref 98–111)
Creatinine, Ser: 1.6 mg/dL — ABNORMAL HIGH (ref 0.44–1.00)
GFR, Estimated: 36 mL/min — ABNORMAL LOW (ref >60)
Glucose, Bld: 270 mg/dL — ABNORMAL HIGH (ref 70–99)
Potassium: 3.6 mmol/L (ref 3.5–5.1)
Sodium: 136 mmol/L (ref 135–145)

## 2023-05-15 LAB — GLUCOSE, CAPILLARY
Glucose-Capillary: 249 mg/dL — ABNORMAL HIGH (ref 70–99)
Glucose-Capillary: 266 mg/dL — ABNORMAL HIGH (ref 70–99)
Glucose-Capillary: 320 mg/dL — ABNORMAL HIGH (ref 70–99)
Glucose-Capillary: 347 mg/dL — ABNORMAL HIGH (ref 70–99)
Glucose-Capillary: 265 mg/dL — ABNORMAL HIGH (ref 70–99)

## 2023-05-15 LAB — FOLATE: Folate: 16.8 ng/mL (ref >5.9)

## 2023-05-15 LAB — MAGNESIUM: Magnesium: 1.7 mg/dL (ref 1.7–2.4)

## 2023-05-15 MED ORDER — SODIUM CHLORIDE 0.9 % IV SOLN: INTRAVENOUS | Status: AC

## 2023-05-15 MED ORDER — INSULIN GLARGINE-YFGN 100 UNIT/ML ~~LOC~~ SOLN
42.0000 [IU] | Freq: Every day | SUBCUTANEOUS | Status: DC
  Administered 2023-05-15: 42 [IU] via SUBCUTANEOUS
  Filled 2023-05-15 (×2): qty 0.42

## 2023-05-15 MED ORDER — VANCOMYCIN HCL 750 MG/150ML IV SOLN
750.0000 mg | INTRAVENOUS | Status: DC
  Administered 2023-05-15 – 2023-05-18 (×4): 750 mg via INTRAVENOUS
  Filled 2023-05-15 (×6): qty 150

## 2023-05-15 MED ORDER — INSULIN ASPART 100 UNIT/ML IJ SOLN
6.0000 [IU] | Freq: Three times a day (TID) | INTRAMUSCULAR | Status: DC
  Administered 2023-05-15 – 2023-05-21 (×14): 6 [IU] via SUBCUTANEOUS

## 2023-05-15 MED ORDER — SODIUM CHLORIDE 0.9 % IV BOLUS
1000.0000 mL | Freq: Once | INTRAVENOUS | Status: AC
  Administered 2023-05-15: 1000 mL via INTRAVENOUS

## 2023-05-15 NOTE — Care Management Important Message (Signed)
Important Message  Patient Details  Name: Danielle Schroeder MRN: [id number] Date of Birth: [birth year]   Medicare Important Message Given:  Yes     Renie Ora 05/15/2023, 9:01 AM

## 2023-05-15 NOTE — Progress Notes (Signed)
PROGRESS NOTE    Danielle VIRGA  Schroeder:[id number] DOB: 1956-03-31 DOA: 05/12/2023 PCP: [name], MD   Brief Narrative:  67 year old female with medical history significant for type II DM/IDDM, PAD with balloon angioplasty and left superficial femoral artery stenting 04/11/2023 by Dr. Nolon Bussing, HTN, HLD, hypothyroid, chronic diastolic CHF, initially presented to the [facility] on 05/12/2023 with complaints of right first toe swelling, redness and pain which started off as an ulcer approximately a month ago and has progressively worsened. Admitted for right diabetic foot infection (first toe cellulitis/abscess and osteomyelitis) complicating underlying poorly controlled type II DM with likely peripheral neuropathy, peripheral artery disease. EDP discussed with orthopedics who recommended vascular surgery consultation given underlying PAD. Discussed with Dr. Myra Gianotti, VVS on-call on 6/30, recommends transfer to [facility] for likely angiogram which at the earliest may be possible on 7/3.    Assessment & Plan:  Principal Problem:   Osteomyelitis of right lower extremity (HCC) Active Problems:   Acquired hypothyroidism   DM2 (diabetes mellitus, type 2) (HCC)   CKD stage 3b, GFR 30-44 ml/min (HCC)   Essential hypertension   HLD (hyperlipidemia)   Sepsis (HCC)   Chronic diastolic CHF (congestive heart failure) (HCC)   History of anemia due to chronic kidney disease       Diabetic foot infection (cellulitis/abscess and osteomyelitis of right first toe) History of peripheral vascular disease status post balloon angioplasty and stenting of left superficial femoral artery 5/29 -Complicated by DM2, peripheral neuropathy and PAD.  X-ray showing concerns of right great toe osteo.  Seen by vascular, planning on right lower extremity angiogram on 7/3 with possible right great toe amputation on 7/5 - Aspirin, statin, empiric vancomycin, cefepime and Flagyl.    Stage IIIb CKD: Creatinine  is stable around 1.33 > 1.6.  Normal saline bolus, hold Lasix   Poorly controlled type II DM with peripheral neuropathy: Hyperglycemia -A1c 12.2.  Sliding scale and Accu-Chek.  Increase Semglee 42 units, Premeal NovoLog 6 units   Essential hypertension: -Losartan on hold - Continue Procardia, Coreg.  IV as needed   Hyperlipidemia: -Daily Zocor   Hypothyroidism: - Continue levothyroxine 88 mcg daily.   Chronic diastolic CHF: EF 95% Echo in March 2021 showed EF of 65%.  Repeated, showed EF of 65%, grade 1 DD - Continue aspirin, statin   Normocytic anemia: Hemoglobin is around baseline of 9.0   Pseudohyponatremia -Secondary to hyperglycemia.   Body mass index is 35.67 kg/m./Obesity.      DVT prophylaxis: Lovenox Code Status: Full Family Communication:   Status is: Inpatient Remains inpatient appropriate because: Cont hospital stay for Angio on Wednesday   Pressure Injury 05/13/23 Heel Left;Right Stage 2 -  Partial thickness loss of dermis presenting as a shallow open injury with a red, pink wound bed without slough. (Active)  05/13/23 1930  Location: Heel  Location Orientation: Left;Right  Staging: Stage 2 -  Partial thickness loss of dermis presenting as a shallow open injury with a red, pink wound bed without slough.  Wound Description (Comments):   Present on Admission: Yes     Diet Orders (From admission, onward)     Start     Ordered   05/16/23 0001  Diet NPO time specified Except for: Sips with Meds  Diet effective midnight       Question:  Except for  Answer:  Clearance Coots with Meds   05/15/23 0729   05/14/23 1616  Diet heart healthy/carb modified Room service appropriate? Yes  with Assist; Fluid consistency: Thin  Diet effective now       Question Answer Comment  Diet-HS Snack? Nothing   Room service appropriate? Yes with Assist   Fluid consistency: Thin      05/14/23 1616            Subjective: Patient is feeling okay, no  complaints  Examination:  General exam: Appears calm and comfortable  Respiratory system: Clear to auscultation. Respiratory effort normal. Cardiovascular system: S1 & S2 heard, RRR. No JVD, murmurs, rubs, gallops or clicks. No pedal edema. Gastrointestinal system: Abdomen is nondistended, soft and nontender. No organomegaly or masses felt. Normal bowel sounds heard. Central nervous system: Alert and oriented. No focal neurological deficits. Extremities: Symmetric 5 x 5 power. Skin: No rashes, lesions or ulcers Psychiatry: Judgement and insight appear normal. Mood & affect appropriate.  Objective: Vitals:   05/15/23 0500 05/15/23 0701 05/15/23 0709 05/15/23 0728  BP:    102/69  Pulse:   93 85  Resp:  (!) 32 17 20  Temp:    97.9 F (36.6 C)  TempSrc:    Oral  SpO2:   92% 99%  Weight: 99.7 kg     Height:        Intake/Output Summary (Last 24 hours) at 05/15/2023 0810 Last data filed at 05/15/2023 0400 Gross per 24 hour  Intake 1123.75 ml  Output 300 ml  Net 823.75 ml   Filed Weights   05/13/23 0500 05/14/23 0500 05/15/23 0500  Weight: 103.3 kg 100.5 kg 99.7 kg    Scheduled Meds:  aspirin EC  81 mg Oral Daily   carvedilol  25 mg Oral BID WC   enoxaparin (LOVENOX) injection  50 mg Subcutaneous Q24H   furosemide  40 mg Oral BID   insulin aspart  0-15 Units Subcutaneous TID WC   insulin aspart  0-5 Units Subcutaneous QHS   insulin aspart  3 Units Subcutaneous TID WC   insulin glargine-yfgn  35 Units Subcutaneous Daily   levothyroxine  88 mcg Oral Daily   NIFEdipine  30 mg Oral QPM   potassium chloride  10 mEq Oral Daily   simvastatin  20 mg Oral QPM   Continuous Infusions:  sodium chloride Stopped (05/14/23 1544)   ceFEPime (MAXIPIME) IV 2 g (05/14/23 2207)   metronidazole 500 mg (05/14/23 2245)   vancomycin Stopped (05/14/23 1246)    Nutritional status     Body mass index is 34.43 kg/m.  Data Reviewed:   CBC: Recent Labs  Lab 05/12/23 1920 05/13/23 1051  05/14/23 0127 05/15/23 0113  WBC 12.3* 9.8 9.1 7.6  NEUTROABS  --  8.3*  --   --   HGB 9.8* 8.8* 9.1* 9.4*  HCT 31.1* 28.2* 29.4* 30.0*  MCV 86.1 86.2 85.0 86.7  PLT 231 200 211 228   Basic Metabolic Panel: Recent Labs  Lab 05/12/23 1920 05/13/23 1051 05/14/23 0127 05/15/23 0113  NA 129* 132* 133* 136  K 3.9 3.5 3.3* 3.6  CL 94* 99 99 101  CO2 23 25 24 25   GLUCOSE 482* 317* 213* 270*  BUN 16 15 13 16   CREATININE 1.77* 1.36* 1.33* 1.60*  CALCIUM 8.6* 8.7* 8.5* 8.7*  MG  --  1.7  --  1.7   GFR: Estimated Creatinine Clearance: 42.5 mL/min (A) (by C-G formula based on SCr of 1.6 mg/dL (H)). Liver Function Tests: Recent Labs  Lab 05/13/23 1051  AST 13*  ALT 11  ALKPHOS 107  BILITOT 0.9  PROT  6.4*  ALBUMIN 2.4*   No results for input(s): "LIPASE", "AMYLASE" in the last 168 hours. No results for input(s): "AMMONIA" in the last 168 hours. Coagulation Profile: Recent Labs  Lab 05/13/23 1051  INR 1.2   Cardiac Enzymes: No results for input(s): "CKTOTAL", "CKMB", "CKMBINDEX", "TROPONINI" in the last 168 hours. BNP (last 3 results) No results for input(s): "PROBNP" in the last 8760 hours. HbA1C: Recent Labs    05/13/23 1051  HGBA1C 12.2*   CBG: Recent Labs  Lab 05/14/23 1220 05/14/23 1647 05/14/23 2037 05/15/23 0610 05/15/23 0728  GLUCAP 303* 222* 282* 249* 266*   Lipid Profile: No results for input(s): "CHOL", "HDL", "LDLCALC", "TRIG", "CHOLHDL", "LDLDIRECT" in the last 72 hours. Thyroid Function Tests: No results for input(s): "TSH", "T4TOTAL", "FREET4", "T3FREE", "THYROIDAB" in the last 72 hours. Anemia Panel: Recent Labs    05/15/23 0113  FOLATE 16.8   Sepsis Labs: Recent Labs  Lab 05/12/23 2021  LATICACIDVEN 1.8    Recent Results (from the past 240 hour(s))  Blood culture (routine x 2)     Status: None (Preliminary result)   Collection Time: 05/12/23  8:21 PM   Specimen: BLOOD  Result Value Ref Range Status   Specimen Description    Final    BLOOD LEFT ANTECUBITAL Performed at [facility], [address] [place], [place] [address]    Special Requests   Final    BOTTLES DRAWN AEROBIC AND ANAEROBIC Blood Culture adequate volume Performed at [facility], [address], [place], [place] [address]    Culture   Final    NO GROWTH 2 DAYS Performed at [facility], [address], [place], [place] [address]    Report Status PENDING  Incomplete  Blood culture (routine x 2)     Status: None (Preliminary result)   Collection Time: 05/12/23  8:42 PM   Specimen: BLOOD  Result Value Ref Range Status   Specimen Description   Final    BLOOD RIGHT ANTECUBITAL Performed at [facility], [address] [place], [place] [address]    Special Requests   Final    BOTTLES DRAWN AEROBIC AND ANAEROBIC Blood Culture adequate volume Performed at [facility], [address] [place], [place] [address]    Culture  Setup Time   Final    GRAM POSITIVE COCCI IN CHAINS AEROBIC BOTTLE ONLY CRITICAL RESULT CALLED TO, READ BACK BY AND VERIFIED WITH: PHARMD J. LEDFORD 05/14/23 @ 0425 BY AB Performed at [facility], [address] [place], [place] [address]    Culture GRAM POSITIVE COCCI  Final   Report Status PENDING  Incomplete  Blood Culture ID Panel (Reflexed)     Status: Abnormal   Collection Time: 05/12/23  8:42 PM  Result Value Ref Range Status   Enterococcus faecalis NOT DETECTED NOT DETECTED Final   Enterococcus Faecium NOT DETECTED NOT DETECTED Final   Listeria monocytogenes NOT DETECTED NOT DETECTED Final   Staphylococcus species NOT DETECTED NOT DETECTED Final   Staphylococcus aureus (BCID) NOT DETECTED NOT DETECTED Final   Staphylococcus epidermidis NOT DETECTED NOT DETECTED Final   Staphylococcus lugdunensis NOT DETECTED NOT DETECTED Final   Streptococcus species DETECTED (A) NOT DETECTED Final    Comment: Not Enterococcus species,  Streptococcus agalactiae, Streptococcus pyogenes, or Streptococcus pneumoniae. CRITICAL RESULT CALLED TO, READ BACK BY AND VERIFIED WITH: PHARMD J. LEDFORD 05/14/23 @ 0425 BY AB    Streptococcus agalactiae NOT DETECTED  NOT DETECTED Final   Streptococcus pneumoniae NOT DETECTED NOT DETECTED Final   Streptococcus pyogenes NOT DETECTED NOT DETECTED Final   A.calcoaceticus-baumannii NOT DETECTED NOT DETECTED Final   Bacteroides fragilis NOT DETECTED NOT DETECTED Final   Enterobacterales NOT DETECTED NOT DETECTED Final   Enterobacter cloacae complex NOT DETECTED NOT DETECTED Final   Escherichia coli NOT DETECTED NOT DETECTED Final   Klebsiella aerogenes NOT DETECTED NOT DETECTED Final   Klebsiella oxytoca NOT DETECTED NOT DETECTED Final   Klebsiella pneumoniae NOT DETECTED NOT DETECTED Final   Proteus species NOT DETECTED NOT DETECTED Final   Salmonella species NOT DETECTED NOT DETECTED Final   Serratia marcescens NOT DETECTED NOT DETECTED Final   Haemophilus influenzae NOT DETECTED NOT DETECTED Final   Neisseria meningitidis NOT DETECTED NOT DETECTED Final   Pseudomonas aeruginosa NOT DETECTED NOT DETECTED Final   Stenotrophomonas maltophilia NOT DETECTED NOT DETECTED Final   Candida albicans NOT DETECTED NOT DETECTED Final   Candida auris NOT DETECTED NOT DETECTED Final   Candida glabrata NOT DETECTED NOT DETECTED Final   Candida krusei NOT DETECTED NOT DETECTED Final   Candida parapsilosis NOT DETECTED NOT DETECTED Final   Candida tropicalis NOT DETECTED NOT DETECTED Final   Cryptococcus neoformans/gattii NOT DETECTED NOT DETECTED Final    Comment: Performed at [facility], [address], [place], [place] [address]  Wet prep, genital     Status: None   Collection Time: 05/12/23  9:47 PM  Result Value Ref Range Status   Yeast Wet Prep HPF POC NONE SEEN NONE SEEN Final   Trich, Wet Prep NONE SEEN NONE SEEN Final   Clue Cells Wet Prep HPF POC NONE SEEN NONE SEEN Final   WBC, Wet  Prep HPF POC <10 <10 Final   Sperm NONE SEEN  Final    Comment: Performed at [facility], [address] [place], [place] [address]         Radiology Studies: ECHOCARDIOGRAM COMPLETE  Result Date: 05/14/2023    ECHOCARDIOGRAM REPORT   Patient Name:   Danielle Schroeder Date of Exam: 05/14/2023 Medical Rec #:  [id number]         Height:       67.0 in Accession #:    [id number]        Weight:       221.6 lb Date of Birth:  12/12/1955         BSA:          2.112 m Patient Age:    66 years          BP:           141/80 mmHg Patient Gender: F                 HR:           86 bpm. Exam Location:  Inpatient Procedure: 2D Echo, Cardiac Doppler and Color Doppler Indications:    CHF-I50.9  History:        Patient has prior history of Echocardiogram examinations, most                 recent 02/03/2020. CHF, PAD; Risk Factors:Hypertension, Sleep                 Apnea, Diabetes and Dyslipidemia. CKD, stage 3.  Sonographer:    Lucendia Herrlich Referring Phys: [id number] ANAND D HONGALGI IMPRESSIONS  1. Left ventricular ejection fraction, by estimation, is 60 to 65%. The  left ventricle has normal function. The left ventricle has no regional wall motion abnormalities. There is mild left ventricular hypertrophy of the basal-septal segment. Left ventricular diastolic parameters are consistent with Grade I diastolic dysfunction (impaired relaxation).  2. Right ventricular systolic function is normal. The right ventricular size is normal. There is normal pulmonary artery systolic pressure. The estimated right ventricular systolic pressure is 31.8 mmHg.  3. The mitral valve is degenerative. Trivial mitral valve regurgitation. Mild mitral stenosis. The mean mitral valve gradient is 3.6 mmHg with average heart rate of 84 bpm. Moderate mitral annular calcification.  4. The aortic valve is tricuspid. Aortic valve regurgitation is not visualized.  5. The inferior vena cava is normal in size with <50% respiratory  variability, suggesting right atrial pressure of 8 mmHg. Comparison(s): Changes from prior study are noted. 02/03/2020: LVEF 55-60%. FINDINGS  Left Ventricle: Left ventricular ejection fraction, by estimation, is 60 to 65%. The left ventricle has normal function. The left ventricle has no regional wall motion abnormalities. The left ventricular internal cavity size was normal in size. There is  mild left ventricular hypertrophy of the basal-septal segment. Left ventricular diastolic parameters are consistent with Grade I diastolic dysfunction (impaired relaxation). Indeterminate filling pressures. Right Ventricle: The right ventricular size is normal. No increase in right ventricular wall thickness. Right ventricular systolic function is normal. There is normal pulmonary artery systolic pressure. The tricuspid regurgitant velocity is 2.44 m/s, and  with an assumed right atrial pressure of 8 mmHg, the estimated right ventricular systolic pressure is 31.8 mmHg. Left Atrium: Left atrial size was normal in size. Right Atrium: Right atrial size was normal in size. Pericardium: There is no evidence of pericardial effusion. Mitral Valve: The mitral valve is degenerative in appearance. There is mild calcification of the anterior and posterior mitral valve leaflet(s). Moderate mitral annular calcification. Trivial mitral valve regurgitation. Mild mitral valve stenosis. The mean mitral valve gradient is 3.6 mmHg with average heart rate of 84 bpm. Tricuspid Valve: The tricuspid valve is grossly normal. Tricuspid valve regurgitation is trivial. Aortic Valve: The aortic valve is tricuspid. Aortic valve regurgitation is not visualized. Aortic valve peak gradient measures 10.1 mmHg. Pulmonic Valve: The pulmonic valve was normal in structure. Pulmonic valve regurgitation is not visualized. Aorta: The aortic root and ascending aorta are structurally normal, with no evidence of dilitation. Venous: The inferior vena cava is normal in  size with less than 50% respiratory variability, suggesting right atrial pressure of 8 mmHg. IAS/Shunts: No atrial level shunt detected by color flow Doppler.  LEFT VENTRICLE PLAX 2D LVIDd:         3.90 cm   Diastology LVIDs:         2.80 cm   LV e' medial:    7.93 cm/s LV PW:         0.90 cm   LV E/e' medial:  14.6 LV IVS:        1.30 cm   LV e' lateral:   10.90 cm/s LVOT diam:     2.20 cm   LV E/e' lateral: 10.6 LV SV:         77 LV SV Index:   37 LVOT Area:     3.80 cm  RIGHT VENTRICLE             IVC RV S prime:     12.40 cm/s  IVC diam: 1.70 cm TAPSE (M-mode): 2.1 cm LEFT ATRIUM  Index        RIGHT ATRIUM          Index LA diam:        3.70 cm 1.75 cm/m   RA Area:     9.31 cm LA Vol (A2C):   53.3 ml 25.21 ml/m  RA Volume:   15.60 ml 7.39 ml/m LA Vol (A4C):   69.2 ml 32.76 ml/m LA Biplane Vol: 71.5 ml 33.85 ml/m  AORTIC VALVE AV Area (Vmax): 2.93 cm AV Vmax:        159.00 cm/s AV Peak Grad:   10.1 mmHg LVOT Vmax:      122.67 cm/s LVOT Vmean:     78.133 cm/s LVOT VTI:       0.203 m  AORTA Ao Root diam: 3.20 cm Ao Asc diam:  3.70 cm MITRAL VALVE                TRICUSPID VALVE MV Area (PHT): 3.65 cm     TR Peak grad:   23.8 mmHg MV Mean grad:  3.6 mmHg     TR Vmax:        244.00 cm/s MV Decel Time: 208 msec MV E velocity: 116.00 cm/s  SHUNTS MV A velocity: 150.00 cm/s  Systemic VTI:  0.20 m MV E/A ratio:  0.77         Systemic Diam: 2.20 cm Zoila Shutter MD Electronically signed by Zoila Shutter MD Signature Date/Time: 05/14/2023/3:51:40 PM    Final            LOS: 3 days   Time spent= 35 mins    Rayanna Matusik Joline Maxcy, MD [facility]  If 7PM-7AM, please contact night-coverage  05/15/2023, 8:10 AM

## 2023-05-15 NOTE — Progress Notes (Signed)
Occupational Therapy Evaluation Patient Details Name: Danielle Schroeder MRN: [id number] DOB: [birth year] Today's Date: 05/15/2023   History of Present Illness Pt is 67 year old presented to [facility] on  05/12/23 for sepsis due to rt great toe osteomyelitis. Pt transferred to [facility] on 05/13/23 for pending vascular intervention. PMH - DM, PVD, chf, hypothyroid, htn, LE angioplasty and stenting, ckd   Clinical Impression   Patient's PLOF includes being Independent in ADLd and IADls and is modified Independent with use of cane for functional mobility.  Patient lives alone but has family that lives nearby (her Celine Ahr that patient reports can help her as needed).  Patient currently requires min guard for use of RW for functional mobility and min guard for LB ADLs and verbal cues for overall safety.  Patient has pending sx on R foot and functional status for functional mobility and ADLs may change accordingly.  OT will continue to follow and recommend further rehab as needed     Recommendations for follow up therapy are one component of a multi-disciplinary discharge planning process, led by the attending physician.  Recommendations may be updated based on patient status, additional functional criteria and insurance authorization.   Assistance Recommended at Discharge Intermittent Supervision/Assistance  Patient can return home with the following Assistance with cooking/housework;Help with stairs or ramp for entrance    Functional Status Assessment  Patient has had a recent decline in their functional status and demonstrates the ability to make significant improvements in function in a reasonable and predictable amount of time.  Equipment Recommendations  None recommended by OT    Recommendations for Other Services       Precautions / Restrictions Precautions Precautions: None Restrictions Weight Bearing Restrictions: No      Mobility Bed Mobility Overal bed mobility: Independent                   Transfers Overall transfer level: Needs assistance Equipment used: Standard walker Transfers: Sit to/from Stand Sit to Stand: Supervision           General transfer comment: for safety/lines      Balance                                           ADL either performed or assessed with clinical judgement   ADL Overall ADL's : Needs assistance/impaired Eating/Feeding: Independent   Grooming: Wash/dry hands;Wash/dry face;Min guard   Upper Body Bathing: Independent   Lower Body Bathing: Set up;Cueing for safety   Upper Body Dressing : Independent   Lower Body Dressing: Min guard   Toilet Transfer: Min guard;Cueing for safety   Toileting- Clothing Manipulation and Hygiene: Supervision/safety       Functional mobility during ADLs: Min guard;Rolling walker (2 wheels)       Vision Baseline Vision/History: 1 Wears glasses Patient Visual Report: No change from baseline       Perception     Praxis      Pertinent Vitals/Pain Pain Assessment Pain Assessment: No/denies pain     Hand Dominance Right   Extremity/Trunk Assessment Upper Extremity Assessment Upper Extremity Assessment: Overall WFL for tasks assessed       Cervical / Trunk Assessment Cervical / Trunk Assessment: Normal   Communication Communication Communication: No difficulties   Cognition Arousal/Alertness: Awake/alert Behavior During Therapy: WFL for tasks assessed/performed Overall Cognitive Status: Within Functional Limits for tasks assessed  General Comments       Exercises     Shoulder Instructions      Home Living Family/patient expects to be discharged to:: Private residence Living Arrangements: Alone Available Help at Discharge: Family (patient reports that her aunt lives next door and help as needed) Type of Home: House Home Access: Stairs to enter Secretary/administrator of Steps: 1   Home  Layout: One level     Bathroom Shower/Tub: Chief Strategy Officer: Handicapped height     Home Equipment: Cane - single point          Prior Functioning/Environment Prior Level of Function : Independent/Modified Independent;Driving             Mobility Comments: Uses cane          OT Problem List: Decreased strength;Decreased safety awareness;Decreased activity tolerance      OT Treatment/Interventions: Self-care/ADL training;Therapeutic exercise;Therapeutic activities;Patient/family education    OT Goals(Current goals can be found in the care plan section) Acute Rehab OT Goals OT Goal Formulation: With patient Time For Goal Achievement: 05/29/23 Potential to Achieve Goals: Good  OT Frequency: Min 2X/week    Co-evaluation              AM-PAC OT "6 Clicks" Daily Activity     Outcome Measure Help from another person eating meals?: None Help from another person taking care of personal grooming?: None Help from another person toileting, which includes using toliet, bedpan, or urinal?: A Little Help from another person bathing (including washing, rinsing, drying)?: A Little Help from another person to put on and taking off regular upper body clothing?: None Help from another person to put on and taking off regular lower body clothing?: A Little 6 Click Score: 21   End of Session Equipment Utilized During Treatment: Gait belt;Rolling walker (2 wheels) Nurse Communication: Mobility status  Activity Tolerance: Patient tolerated treatment well Patient left: in bed;with call bell/phone within reach  OT Visit Diagnosis: Unsteadiness on feet (R26.81);Muscle weakness (generalized) (M62.81)                Time: 4098-1191 OT Time Calculation (min): 12 min Charges:  OT Evaluation $OT Eval Moderate Complexity: 1 Mod  Governor Specking OT/L  Denice Paradise 05/15/2023, 3:30 PM

## 2023-05-15 NOTE — Progress Notes (Signed)
Pharmacy Antibiotic Note  Danielle Schroeder is a 67 y.o. female admitted on 05/12/2023  with right great toe osteomyelitis. Pharmacy has been consulted to dose vancomycin and cefepime. Also on metronidazole.   Scr worsened. 7/2 Vancomycin 750mg  Q 24 hr Scr used: 1.6 mg/dL Weight: 16.1 kg Vd coeff: 0.5 L/kg Est AUC: 460  Plan: Decrease vancomycin to 750 mg IV q24h   Continue cefepime 2 g IV q12h Monitor renal function, clinical progress, cultures/sensitivities F/U LOT and de-escalate as able Vancomycin levels as clinically indicated   Height: 5\' 7"  (170.2 cm) Weight: 99.7 kg (219 lb 12.8 oz) IBW/kg (Calculated) : 61.6  Temp (24hrs), Avg:97.7 F (36.5 C), Min:97.5 F (36.4 C), Max:97.9 F (36.6 C)  Recent Labs  Lab 05/12/23 1920 05/12/23 2021 05/13/23 1051 05/14/23 0127 05/15/23 0113  WBC 12.3*  --  9.8 9.1 7.6  CREATININE 1.77*  --  1.36* 1.33* 1.60*  LATICACIDVEN  --  1.8  --   --   --      Estimated Creatinine Clearance: 42.5 mL/min (A) (by C-G formula based on SCr of 1.6 mg/dL (H)).    No Known Allergies  Antimicrobials this admission: cefepime 6/29 >> vancomycin 6/30 >> metronidazole 6/30 >>  Dose adjustments this admission: 7/2 vanc 1000>750mg  q24hr  Microbiology results: 6/29 BCx: 2/4 bottles staph species -contaminant   Thank you for involving pharmacy in this patient's care.  Alphia Moh, PharmD, BCPS, BCCP Clinical Pharmacist  Please check AMION for all [facility] Pharmacy phone numbers After 10:00 PM, call Main Pharmacy [phone]

## 2023-05-15 NOTE — Progress Notes (Addendum)
  Progress Note    05/15/2023 7:26 AM * No surgery date entered *  Subjective:  no complaints    Vitals:   05/15/23 0701 05/15/23 0709  BP:    Pulse:  93  Resp: (!) 32 17  Temp:    SpO2:  92%    Physical Exam: General:  resting comfortably Cardiac:  regular Lungs:  nonlabored Extremities:  R GT ulceration  CBC    Component Value Date/Time   WBC 7.6 05/15/2023 0113   RBC 3.46 (L) 05/15/2023 0113   HGB 9.4 (L) 05/15/2023 0113   HGB 10.7 (L) 03/30/2020 1209   HCT 30.0 (L) 05/15/2023 0113   HCT 32.4 (L) 03/30/2020 1209   PLT 228 05/15/2023 0113   PLT 188 03/30/2020 1209   MCV 86.7 05/15/2023 0113   MCV 86 03/30/2020 1209   MCH 27.2 05/15/2023 0113   MCHC 31.3 05/15/2023 0113   RDW 14.8 05/15/2023 0113   RDW 13.9 03/30/2020 1209   LYMPHSABS 0.8 05/13/2023 1051   MONOABS 0.7 05/13/2023 1051   EOSABS 0.1 05/13/2023 1051   BASOSABS 0.0 05/13/2023 1051    BMET    Component Value Date/Time   NA 136 05/15/2023 0113   NA 142 03/30/2020 1209   K 3.6 05/15/2023 0113   CL 101 05/15/2023 0113   CO2 25 05/15/2023 0113   GLUCOSE 270 (H) 05/15/2023 0113   BUN 16 05/15/2023 0113   BUN 26 03/30/2020 1209   CREATININE 1.60 (H) 05/15/2023 0113   CREATININE 1.44 (H) 05/06/2020 0946   CALCIUM 8.7 (L) 05/15/2023 0113   GFRNONAA 36 (L) 05/15/2023 0113   GFRNONAA 39 (L) 05/06/2020 0946   GFRAA 45 (L) 05/06/2020 0946    INR    Component Value Date/Time   INR 1.2 05/13/2023 1051     Intake/Output Summary (Last 24 hours) at 05/15/2023 0726 Last data filed at 05/15/2023 0400 Gross per 24 hour  Intake 1363.75 ml  Output 300 ml  Net 1063.75 ml      Assessment/Plan:  67 y.o. female with right great toe osteomyelitis   -Denies any pain this morning in the right foot -Plan for RLE angiogram tomorrow with Dr.Robins. All questions have been answered and the patient is agreeable -Will make NPO at midnight. Consent orders have been placed -Currently also planning for  right GT amputation on Friday   Loel Dubonnet, [place] [facility] [phone] 05/15/2023 7:26 AM  Plan for angiography and later toe amputation.  Durene Cal

## 2023-05-15 NOTE — Progress Notes (Addendum)
Mobility Specialist Progress Note:   05/15/23 1627  Mobility  Activity Ambulated with assistance in hallway  Level of Assistance Contact guard assist, steadying assist  Assistive Device Front wheel walker  Distance Ambulated (ft) 350 ft  Activity Response Tolerated well  Mobility Referral Yes  $Mobility charge 1 Mobility  Mobility Specialist Start Time (ACUTE ONLY) 1455  Mobility Specialist Stop Time (ACUTE ONLY) 1510  Mobility Specialist Time Calculation (min) (ACUTE ONLY) 15 min    Pt received in bed, agreeable to mobility. C/o leg weakness during ambulation, requiring rest break, otherwise asymptomatic throughout. Pt returned to EOB with nurse tech in room.   Leory Plowman  Mobility Specialist Please contact via [employer] office at [phone]

## 2023-05-16 ENCOUNTER — Encounter (HOSPITAL_COMMUNITY)
Admission: EM | Disposition: A | Discharge status: Home Health | Payer: Self-pay | Source: Home / Self Care | Attending: Internal Medicine

## 2023-05-16 DIAGNOSIS — M869 Osteomyelitis, unspecified: Secondary | ICD-10-CM | POA: Diagnosis not present

## 2023-05-16 DIAGNOSIS — I70235 Atherosclerosis of native arteries of right leg with ulceration of other part of foot: Secondary | ICD-10-CM | POA: Diagnosis not present

## 2023-05-16 HISTORY — PX: PERIPHERAL VASCULAR INTERVENTION: CATH118257

## 2023-05-16 HISTORY — PX: ABDOMINAL AORTOGRAM W/LOWER EXTREMITY: CATH118223

## 2023-05-16 LAB — GLUCOSE, CAPILLARY
Glucose-Capillary: 293 mg/dL — ABNORMAL HIGH (ref 70–99)
Glucose-Capillary: 310 mg/dL — ABNORMAL HIGH (ref 70–99)
Glucose-Capillary: 195 mg/dL — ABNORMAL HIGH (ref 70–99)
Glucose-Capillary: 262 mg/dL — ABNORMAL HIGH (ref 70–99)

## 2023-05-16 LAB — BASIC METABOLIC PANEL
Anion gap: 8 (ref 5–15)
BUN: 19 mg/dL (ref 8–23)
CO2: 23 mmol/L (ref 22–32)
Calcium: 8.5 mg/dL — ABNORMAL LOW (ref 8.9–10.3)
Chloride: 103 mmol/L (ref 98–111)
Creatinine, Ser: 1.63 mg/dL — ABNORMAL HIGH (ref 0.44–1.00)
GFR, Estimated: 35 mL/min — ABNORMAL LOW (ref >60)
Glucose, Bld: 299 mg/dL — ABNORMAL HIGH (ref 70–99)
Potassium: 3.6 mmol/L (ref 3.5–5.1)
Sodium: 134 mmol/L — ABNORMAL LOW (ref 135–145)

## 2023-05-16 LAB — CULTURE, BLOOD (ROUTINE X 2): Special Requests: ADEQUATE

## 2023-05-16 LAB — CBC
HCT: 28.2 % — ABNORMAL LOW (ref 36.0–46.0)
Hemoglobin: 8.7 g/dL — ABNORMAL LOW (ref 12.0–15.0)
MCH: 27.1 pg (ref 26.0–34.0)
MCHC: 30.9 g/dL (ref 30.0–36.0)
MCV: 87.9 fL (ref 80.0–100.0)
Platelets: 213 10*3/uL (ref 150–400)
RBC: 3.21 MIL/uL — ABNORMAL LOW (ref 3.87–5.11)
RDW: 14.9 % (ref 11.5–15.5)
WBC: 8 10*3/uL (ref 4.0–10.5)
nRBC: 0 % (ref 0.0–0.2)

## 2023-05-16 LAB — VANCOMYCIN, PEAK: Vancomycin Pk: 10 ug/mL — ABNORMAL LOW (ref 30–40)

## 2023-05-16 LAB — VANCOMYCIN, TROUGH: Vancomycin Tr: 12 ug/mL — ABNORMAL LOW (ref 15–20)

## 2023-05-16 LAB — MAGNESIUM: Magnesium: 1.8 mg/dL (ref 1.7–2.4)

## 2023-05-16 SURGERY — ABDOMINAL AORTOGRAM W/LOWER EXTREMITY
Anesthesia: LOCAL

## 2023-05-16 MED ORDER — MIDAZOLAM HCL 2 MG/2ML IJ SOLN
INTRAMUSCULAR | Status: AC
  Filled 2023-05-16: qty 2

## 2023-05-16 MED ORDER — LIDOCAINE HCL (PF) 1 % IJ SOLN
INTRAMUSCULAR | Status: AC
  Filled 2023-05-16: qty 30

## 2023-05-16 MED ORDER — SODIUM CHLORIDE 0.9% FLUSH
3.0000 mL | Freq: Two times a day (BID) | INTRAVENOUS | Status: DC
  Administered 2023-05-17 – 2023-05-21 (×7): 3 mL via INTRAVENOUS

## 2023-05-16 MED ORDER — LIDOCAINE HCL (PF) 1 % IJ SOLN
INTRAMUSCULAR | Status: DC | PRN
  Administered 2023-05-16: 10 mL

## 2023-05-16 MED ORDER — SODIUM CHLORIDE 0.9 % IV SOLN: 250.0000 mL | INTRAVENOUS | Status: DC | PRN

## 2023-05-16 MED ORDER — FENTANYL CITRATE (PF) 100 MCG/2ML IJ SOLN
INTRAMUSCULAR | Status: DC | PRN
  Administered 2023-05-16: 50 ug via INTRAVENOUS

## 2023-05-16 MED ORDER — ONDANSETRON HCL 4 MG/2ML IJ SOLN: 4.0000 mg | Freq: Four times a day (QID) | INTRAMUSCULAR | Status: DC | PRN

## 2023-05-16 MED ORDER — HYDRALAZINE HCL 20 MG/ML IJ SOLN: 5.0000 mg | INTRAMUSCULAR | Status: DC | PRN

## 2023-05-16 MED ORDER — MIDAZOLAM HCL 2 MG/2ML IJ SOLN
INTRAMUSCULAR | Status: DC | PRN
  Administered 2023-05-16: 1 mg via INTRAVENOUS

## 2023-05-16 MED ORDER — HEPARIN SODIUM (PORCINE) 1000 UNIT/ML IJ SOLN
INTRAMUSCULAR | Status: AC
  Filled 2023-05-16: qty 10

## 2023-05-16 MED ORDER — LABETALOL HCL 5 MG/ML IV SOLN: 10.0000 mg | INTRAVENOUS | Status: DC | PRN

## 2023-05-16 MED ORDER — SODIUM CHLORIDE 0.9 % WEIGHT BASED INFUSION
1.0000 mL/kg/h | INTRAVENOUS | Status: AC
  Administered 2023-05-16: 1 mL/kg/h via INTRAVENOUS

## 2023-05-16 MED ORDER — ACETAMINOPHEN 325 MG PO TABS: 650.0000 mg | ORAL_TABLET | ORAL | Status: DC | PRN

## 2023-05-16 MED ORDER — HEPARIN SODIUM (PORCINE) 5000 UNIT/ML IJ SOLN
5000.0000 [IU] | Freq: Three times a day (TID) | INTRAMUSCULAR | Status: DC
  Administered 2023-05-16 – 2023-05-21 (×13): 5000 [IU] via SUBCUTANEOUS
  Filled 2023-05-16 (×13): qty 1

## 2023-05-16 MED ORDER — HEPARIN SODIUM (PORCINE) 1000 UNIT/ML IJ SOLN
INTRAMUSCULAR | Status: DC | PRN
  Administered 2023-05-16: 8000 [IU] via INTRAVENOUS

## 2023-05-16 MED ORDER — SODIUM CHLORIDE 0.9% FLUSH: 3.0000 mL | INTRAVENOUS | Status: DC | PRN

## 2023-05-16 MED ORDER — INSULIN GLARGINE-YFGN 100 UNIT/ML ~~LOC~~ SOLN
46.0000 [IU] | Freq: Every day | SUBCUTANEOUS | Status: DC
  Administered 2023-05-16 – 2023-05-21 (×5): 46 [IU] via SUBCUTANEOUS
  Filled 2023-05-16 (×6): qty 0.46

## 2023-05-16 MED ORDER — FENTANYL CITRATE (PF) 100 MCG/2ML IJ SOLN
INTRAMUSCULAR | Status: AC
  Filled 2023-05-16: qty 2

## 2023-05-16 SURGICAL SUPPLY — 17 items
BALLN STERLING OTW 3X100X150 (BALLOONS) ×2
BALLOON STERLING OTW 3X100X150 (BALLOONS) IMPLANT
CATH OMNI FLUSH 5F 65CM (CATHETERS) IMPLANT
CATH QUICKCROSS .035X135CM (MICROCATHETER) IMPLANT
CLOSURE PERCLOSE PROSTYLE (VASCULAR PRODUCTS) IMPLANT
GLIDEWIRE ADV .035X260CM (WIRE) IMPLANT
KIT ENCORE 26 ADVANTAGE (KITS) IMPLANT
KIT MICROPUNCTURE NIT STIFF (SHEATH) IMPLANT
KIT PV (KITS) ×2 IMPLANT
SHEATH CATAPULT 6FR 60 (SHEATH) IMPLANT
SHEATH PINNACLE 5F 10CM (SHEATH) IMPLANT
STOPCOCK MORSE 400PSI 3WAY (MISCELLANEOUS) IMPLANT
TRANSDUCER W/STOPCOCK (MISCELLANEOUS) ×2 IMPLANT
TRAY PV CATH (CUSTOM PROCEDURE TRAY) ×2 IMPLANT
TUBING CIL FLEX 10 FLL-RA (TUBING) IMPLANT
WIRE BENTSON .035X145CM (WIRE) IMPLANT
WIRE G V18X300CM (WIRE) IMPLANT

## 2023-05-16 NOTE — Op Note (Signed)
Patient name: Danielle Schroeder MRN: [id number] DOB: Sep 15, 1956 Sex: female  05/12/2023 - 05/16/2023 Pre-operative Diagnosis: Right lower extremity critical ischemia with tissue loss toe Post-operative diagnosis:  Same Surgeon:  Victorino Sparrow, MD Procedure Performed: 1.  Ultrasound-guided access of the left common femoral artery 2.  Second-order cannulation, right lower extremity angiogram 3.  Third cannulation, right lower extremity angiogram from the superficial femoral artery 4.  Third order cannulation, please order angiogram 5.  Balloon angioplasty 3 x 100 posterior tibial artery, peroneal artery 6.  Moderate sedation time 44, contrast volume 50ml 7.  Device assisted closure-Pro-glide   Indications: Patient is a 67 year old female well-known to my practice recently had left lower extremity critical ischemia with tissue loss.  She underwent intervention, and this is since healed.  She presented with new right lower extremity tissue loss at the great toe with depressed ABIs, nonpalpable pulse.  After discussing risk and benefits of right lower extremity angiogram in an effort to define and treat lesions to improve distal perfusion for wound healing, Danielle Schroeder elected to proceed.  Findings:  Aortogram: Deferred-see study from last month-no flow-limiting stenosis On the right: Widely patent common iliac artery, external neck artery, hypogastric artery.  Widely patent common femoral artery, profunda, superficial femoral artery, popliteal artery.  Severe tibial disease. Single-vessel inline flow via the peroneal artery. Severe tibial disease with anterior tibial artery occlusion 3 cm from the ostia, peroneal artery with 2, tandem, 80% flow-limiting lesions proximally.  Proximal posterior tibial artery occlusion with reconstitution at the mid posterior tibial artery continuing into the foot via the plantar branches.  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   Procedure:  The patient was  identified in the holding area and taken to room 8.  The patient was then placed supine on the table and prepped and draped in the usual sterile fashion.  A time out was called.  Ultrasound was used to evaluate the left common femoral artery.  It was patent .  A digital ultrasound image was acquired.  A micropuncture needle was used to access the left common femoral artery under ultrasound guidance.  An 018 wire was advanced without resistance and a micropuncture sheath was placed.  The 018 wire was removed and a benson wire was placed.  The micropuncture sheath was exchanged for a 5 french sheath.  Angiogram was deferred as 1 was completed last month.  An Omni Flush catheter was used to traverse the aortic arch.  The Omni Flush catheter was parked in the right common iliac artery and pelvic angiogram followed.  It was then positioned in the right common femoral artery for right lower extremity angiogram to the foot.  See results above.     I elected to attempt intervention on the posterior tibial and peroneal arteries.  A 6 French by 60 cm sheath was brought onto the field and parked in the superficial femoral artery.  Diagnostic angiography followed from the superficial femoral artery to map the popliteal artery, peroneal artery, posterior tibial artery.  The patient was heparinized.  I was able to use a series of wires and catheters to cannulate the posterior tibial artery and cross the occlusion.  The wire was sent below the level of the ankle into a plantar branch.  I followed this with a catheter and angiography to ensure I was true lumen.  I was, angiography from this site showed that the plantar arch was incomplete.  Next, we used a 3 x 100 mm balloon  to angioplasty the proximal portion of the posterior tibial artery this was inflated for 3 minutes.  Follow-up angiography demonstrated excellent result with resolution of the occlusion.  The vessel was widely patent.  Next, I pulled the balloon and wire  back and recannulated the peroneal artery.  The same 3 x 100 mm balloon was used to plasty the peroneal artery.  Follow-up angiography demonstrated excellent result with resolution of the tandem, flow-limiting stenoses.    This was an excellent result.  Patient was closed with the use of a ProGlide device.  Impression: Recanalization of the posterior tibial artery.  Resolution of flow-limiting stenosis of the peroneal artery.  Two-vessel runoff to the foot with severe microvascular disease appreciated distally.   Danielle Olden, MD [facility] [place] Office: [phone]

## 2023-05-16 NOTE — Progress Notes (Signed)
Patient arrived back to 4e16 from Cathlab. Vital signs obtained. Left groin level zero at this time.  Aamna Mallozzi, Randall An RN   05/16/23 1337  Vitals  BP 133/79  MAP (mmHg) 95  BP Location Left Arm  BP Method Automatic  Patient Position (if appropriate) Lying  Pulse Rate 75  Resp 18  MEWS COLOR  MEWS Score Color Green  Oxygen Therapy  SpO2 91 %  O2 Device Room Air  MEWS Score  MEWS Temp 0  MEWS Systolic 0  MEWS Pulse 0  MEWS RR 0  MEWS LOC 1  MEWS Score 1

## 2023-05-16 NOTE — TOC CM/SW Note (Signed)
Transition of Care [facility]) - Inpatient Brief Assessment   Patient Details  Name: Danielle Schroeder MRN: [id number] Date of Birth: 1956-09-27  [facility]) CM/SW Contact:    Darrold Span, RN Phone Number: 05/16/2023, 2:07 PM   Clinical Narrative: Pt from home alone, Osteomyelitis for Angiogram today, then plans for OR on 7/5 for GT amputation.  We will continue to monitor patient advancement through interdisciplinary progression rounds. If new patient transition needs arise, please place a TOC consult.   Transition of Care Asessment: Insurance and Status: Insurance coverage has been reviewed Patient has primary care physician: Yes Home environment has been reviewed: home alone Prior level of function:: uses cane Prior/Current Home Services: No current home services Social Determinants of Health Reivew: SDOH reviewed no interventions necessary Readmission risk has been reviewed: Yes Transition of care needs: no transition of care needs at this time

## 2023-05-16 NOTE — Progress Notes (Signed)
I have spoken with the patient this morning. She is still agreeable to right lower extremity angiogram with Dr.Robins today. 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 She will require right GT amputation, likely on Friday.  She has been NPO past midnight.  Loel Dubonnet, PA-C [facility] 05/16/2023. 8:04AM

## 2023-05-16 NOTE — Inpatient Diabetes Management (Signed)
Inpatient Diabetes Program Recommendations  AACE/ADA: New Consensus Statement on Inpatient Glycemic Control (2015)  Target Ranges:  Prepandial:   less than 140 mg/dL      Peak postprandial:   less than 180 mg/dL (1-2 hours)      Critically ill patients:  140 - 180 mg/dL   Lab Results  Component Value Date   GLUCAP 293 (H) 05/16/2023   HGBA1C 12.2 (H) 05/13/2023    Latest Reference Range & Units 05/15/23 06:10 05/15/23 07:28 05/15/23 11:16 05/15/23 16:00 05/15/23 20:34 05/16/23 05:58  Glucose-Capillary 70 - 99 mg/dL 161 (H) 096 (H) 045 (H) 347 (H) 265 (H) 293 (H)  (H): Data is abnormally high  Diabetes history: DM2 Outpatient Diabetes medications: Basaglar 40 units every day, Novolog 0-6 units TID Current orders for Inpatient glycemic control: Semglee 40 units every day, Novolog 6 units tid meal coverage, Novolog 0-15 units TID and 0-5 units at bedtime  Inpatient Diabetes Program Recommendations:   Please consider: -Increase Semglee to 46 units -Increase Novolog to 8 units tid meal coverage  Thank you, Darel Hong E. Derrick Orris, RN, MSN, CDE  Diabetes Coordinator Inpatient Glycemic Control Team Team Pager [phone] (8am-5pm) 05/16/2023 9:05 AM

## 2023-05-16 NOTE — Progress Notes (Signed)
Mobility Specialist Progress Note:   05/16/23 1650  Mobility  Activity Ambulated with assistance in hallway  Level of Assistance Contact guard assist, steadying assist  Assistive Device Front wheel walker  Distance Ambulated (ft) 350 ft  Activity Response Tolerated well  Mobility Referral Yes  $Mobility charge 1 Mobility  Mobility Specialist Start Time (ACUTE ONLY) 1625  Mobility Specialist Stop Time (ACUTE ONLY) 1635  Mobility Specialist Time Calculation (min) (ACUTE ONLY) 10 min   Received pt in bed having no complaints and agreeable to mobility. Pt was asymptomatic throughout ambulation and returned to room w/o fault. Left in bed w/ call bell in reach and all needs met.   Leory Plowman  Mobility Specialist Please contact via [employer] office at [phone]

## 2023-05-16 NOTE — Progress Notes (Addendum)
PROGRESS NOTE    Danielle Schroeder  ZOX:[id number] DOB: Oct 10, 1956 DOA: 05/12/2023 PCP: [name], MD   Brief Narrative:  67 year old female with medical history significant for type II DM/IDDM, PAD with balloon angioplasty and left superficial femoral artery stenting 04/11/2023 by Dr. Nolon Bussing, HTN, HLD, hypothyroid, chronic diastolic CHF, initially presented to the [facility] on 05/12/2023 with complaints of right first toe swelling, redness and pain which started off as an ulcer approximately a month ago and has progressively worsened. Admitted for right diabetic foot infection (first toe cellulitis/abscess and osteomyelitis) complicating underlying poorly controlled type II DM with likely peripheral neuropathy, peripheral artery disease. EDP discussed with orthopedics who recommended vascular surgery consultation given underlying PAD. Discussed with Dr. Myra Gianotti, VVS on-call on 6/30, recommends transfer to [facility] for likely angiogram today on 7/3 and likely Right great toe amputation on 7/5   Assessment & Plan:  Principal Problem:   Osteomyelitis of right lower extremity (HCC) Active Problems:   Acquired hypothyroidism   DM2 (diabetes mellitus, type 2) (HCC)   CKD stage 3b, GFR 30-44 ml/min (HCC)   Essential hypertension   HLD (hyperlipidemia)   Sepsis (HCC)   Chronic diastolic CHF (congestive heart failure) (HCC)   History of anemia due to chronic kidney disease       Diabetic foot infection (cellulitis/abscess and osteomyelitis of right first toe) History of peripheral vascular disease status post balloon angioplasty and stenting of left superficial femoral artery 5/29 -Complicated by DM2, peripheral neuropathy and PAD.  X-ray showing concerns of right great toe osteo.  Seen by vascular, planning on right lower extremity angiogram today with possible right great toe amputation on 7/5 - Aspirin, statin, empiric vancomycin, cefepime and Flagyl.    Stage IIIb  CKD: Creatinine is stable around 1.33 > 1.6.  Normal saline bolus, hold Lasix   Poorly controlled type II DM with peripheral neuropathy: Hyperglycemia -A1c 12.2.  Sliding scale and Accu-Chek.  Increase Semglee 46 units, Premeal NovoLog 6 units. Will correct with ISS today as ptn is NPO. But should continue to adjust as appropriate.  Likely will need much higher dose.    Essential hypertension: -Losartan on hold - Continue Procardia, Coreg.  IV as needed   Hyperlipidemia: -Daily Zocor   Hypothyroidism: - Continue levothyroxine 88 mcg daily.   Chronic diastolic CHF: EF 81% Echo in March 2021 showed EF of 65%.  Repeated, showed EF of 65%, grade 1 DD - Continue aspirin, statin   Normocytic anemia: Hemoglobin is around baseline of 9.0   Pseudohyponatremia -Secondary to hyperglycemia.   Body mass index is 35.67 kg/m./Obesity.      DVT prophylaxis: Lovenox Code Status: Full Family Communication:   Status is: Inpatient Remains inpatient appropriate because: Cont hospital stay for Angio on today and great toe amputation on Friday.    Pressure Injury 05/13/23 Heel Left;Right Stage 2 -  Partial thickness loss of dermis presenting as a shallow open injury with a red, pink wound bed without slough. (Active)  05/13/23 1930  Location: Heel  Location Orientation: Left;Right  Staging: Stage 2 -  Partial thickness loss of dermis presenting as a shallow open injury with a red, pink wound bed without slough.  Wound Description (Comments):   Present on Admission: Yes     Diet Orders (From admission, onward)     Start     Ordered   05/16/23 0001  Diet NPO time specified Except for: Sips with Meds  Diet effective midnight  Question:  Except for  Answer:  Clearance Coots with Meds   05/15/23 1610            Subjective: Doing ok, waiting for her procedure.   Examination: Constitutional: Not in acute distress Respiratory: Clear to auscultation bilaterally Cardiovascular: Normal  sinus rhythm, no rubs Abdomen: Nontender nondistended good bowel sounds Musculoskeletal: No edema noted Skin: No rashes seen Neurologic: CN 2-12 grossly intact.  And nonfocal Psychiatric: Normal judgment and insight. Alert and oriented x 3. Normal mood.     Objective: Vitals:   05/16/23 0023 05/16/23 0607 05/16/23 0613 05/16/23 0738  BP: (!) 142/79  (!) 145/81 (!) 132/43  Pulse: 84  83 97  Resp: 17  15 19   Temp: 98.1 F (36.7 C)  97.9 F (36.6 C) (!) 97.4 F (36.3 C)  TempSrc: Oral  Oral Oral  SpO2: 93%  97% 90%  Weight:  102.6 kg    Height:        Intake/Output Summary (Last 24 hours) at 05/16/2023 0852 Last data filed at 05/16/2023 0718 Gross per 24 hour  Intake 1141.43 ml  Output --  Net 1141.43 ml   Filed Weights   05/14/23 0500 05/15/23 0500 05/16/23 0607  Weight: 100.5 kg 99.7 kg 102.6 kg    Scheduled Meds:  aspirin EC  81 mg Oral Daily   carvedilol  25 mg Oral BID WC   enoxaparin (LOVENOX) injection  50 mg Subcutaneous Q24H   insulin aspart  0-15 Units Subcutaneous TID WC   insulin aspart  0-5 Units Subcutaneous QHS   insulin aspart  6 Units Subcutaneous TID WC   insulin glargine-yfgn  42 Units Subcutaneous Daily   levothyroxine  88 mcg Oral Daily   potassium chloride  10 mEq Oral Daily   simvastatin  20 mg Oral QPM   Continuous Infusions:  sodium chloride 50 mL/hr at 05/16/23 0718   ceFEPime (MAXIPIME) IV 2 g (05/15/23 2154)   metronidazole 500 mg (05/15/23 2239)   vancomycin 750 mg (05/15/23 1132)    Nutritional status     Body mass index is 35.44 kg/m.  Data Reviewed:   CBC: Recent Labs  Lab 05/12/23 1920 05/13/23 1051 05/14/23 0127 05/15/23 0113 05/16/23 0044  WBC 12.3* 9.8 9.1 7.6 8.0  NEUTROABS  --  8.3*  --   --   --   HGB 9.8* 8.8* 9.1* 9.4* 8.7*  HCT 31.1* 28.2* 29.4* 30.0* 28.2*  MCV 86.1 86.2 85.0 86.7 87.9  PLT 231 200 211 228 213   Basic Metabolic Panel: Recent Labs  Lab 05/12/23 1920 05/13/23 1051 05/14/23 0127  05/15/23 0113 05/16/23 0044  NA 129* 132* 133* 136 134*  K 3.9 3.5 3.3* 3.6 3.6  CL 94* 99 99 101 103  CO2 23 25 24 25 23   GLUCOSE 482* 317* 213* 270* 299*  BUN 16 15 13 16 19   CREATININE 1.77* 1.36* 1.33* 1.60* 1.63*  CALCIUM 8.6* 8.7* 8.5* 8.7* 8.5*  MG  --  1.7  --  1.7 1.8   GFR: Estimated Creatinine Clearance: 42.4 mL/min (A) (by C-G formula based on SCr of 1.63 mg/dL (H)). Liver Function Tests: Recent Labs  Lab 05/13/23 1051  AST 13*  ALT 11  ALKPHOS 107  BILITOT 0.9  PROT 6.4*  ALBUMIN 2.4*   No results for input(s): "LIPASE", "AMYLASE" in the last 168 hours. No results for input(s): "AMMONIA" in the last 168 hours. Coagulation Profile: Recent Labs  Lab 05/13/23 1051  INR 1.2  Cardiac Enzymes: No results for input(s): "CKTOTAL", "CKMB", "CKMBINDEX", "TROPONINI" in the last 168 hours. BNP (last 3 results) No results for input(s): "PROBNP" in the last 8760 hours. HbA1C: Recent Labs    05/13/23 1051  HGBA1C 12.2*   CBG: Recent Labs  Lab 05/15/23 0728 05/15/23 1116 05/15/23 1600 05/15/23 2034 05/16/23 0558  GLUCAP 266* 320* 347* 265* 293*   Lipid Profile: No results for input(s): "CHOL", "HDL", "LDLCALC", "TRIG", "CHOLHDL", "LDLDIRECT" in the last 72 hours. Thyroid Function Tests: No results for input(s): "TSH", "T4TOTAL", "FREET4", "T3FREE", "THYROIDAB" in the last 72 hours. Anemia Panel: Recent Labs    05/15/23 0113  FOLATE 16.8   Sepsis Labs: Recent Labs  Lab 05/12/23 2021  LATICACIDVEN 1.8    Recent Results (from the past 240 hour(s))  Blood culture (routine x 2)     Status: None (Preliminary result)   Collection Time: 05/12/23  8:21 PM   Specimen: BLOOD  Result Value Ref Range Status   Specimen Description   Final    BLOOD LEFT ANTECUBITAL Performed at [facility], [address], [place], [place] [address]    Special Requests   Final    BOTTLES DRAWN AEROBIC AND ANAEROBIC Blood Culture adequate  volume Performed at [facility], [address], [place], [place] [address]    Culture   Final    NO GROWTH 4 DAYS Performed at [facility], [address], [place], [place] [address]    Report Status PENDING  Incomplete  Blood culture (routine x 2)     Status: Abnormal   Collection Time: 05/12/23  8:42 PM   Specimen: BLOOD  Result Value Ref Range Status   Specimen Description   Final    BLOOD RIGHT ANTECUBITAL Performed at [facility], [address] [place], [place] [address]    Special Requests   Final    BOTTLES DRAWN AEROBIC AND ANAEROBIC Blood Culture adequate volume Performed at [facility], [address], [place], [place] [address]    Culture  Setup Time   Final    GRAM POSITIVE COCCI IN CHAINS AEROBIC BOTTLE ONLY CRITICAL RESULT CALLED TO, READ BACK BY AND VERIFIED WITH: PHARMD J. LEDFORD 05/14/23 @ 0425 BY AB Performed at [facility], [address] [place], [place] [address]    Culture (A)  Final    STREPTOCOCCUS CONSTELLATUS STREPTOCOCCI, BETA HEMOLYTIC    Report Status 05/16/2023 FINAL  Final   Organism ID, Bacteria STREPTOCOCCUS CONSTELLATUS  Final      Susceptibility   Streptococcus constellatus - MIC*    PENICILLIN <=0.06 SENSITIVE Sensitive     CEFTRIAXONE <=0.12 SENSITIVE Sensitive     ERYTHROMYCIN <=0.12 SENSITIVE Sensitive     LEVOFLOXACIN 0.5 SENSITIVE Sensitive     VANCOMYCIN 0.5 SENSITIVE Sensitive     * STREPTOCOCCUS CONSTELLATUS  Blood Culture ID Panel (Reflexed)     Status: Abnormal   Collection Time: 05/12/23  8:42 PM  Result Value Ref Range Status   Enterococcus faecalis NOT DETECTED NOT DETECTED Final   Enterococcus Faecium NOT DETECTED NOT DETECTED Final   Listeria monocytogenes NOT DETECTED NOT DETECTED Final   Staphylococcus species NOT DETECTED NOT DETECTED Final   Staphylococcus aureus (BCID) NOT DETECTED NOT DETECTED Final   Staphylococcus epidermidis NOT  DETECTED NOT DETECTED Final   Staphylococcus lugdunensis NOT DETECTED NOT DETECTED Final   Streptococcus species DETECTED (A) NOT DETECTED Final    Comment: Not Enterococcus  species, Streptococcus agalactiae, Streptococcus pyogenes, or Streptococcus pneumoniae. CRITICAL RESULT CALLED TO, READ BACK BY AND VERIFIED WITH: PHARMD J. LEDFORD 05/14/23 @ 0425 BY AB    Streptococcus agalactiae NOT DETECTED NOT DETECTED Final   Streptococcus pneumoniae NOT DETECTED NOT DETECTED Final   Streptococcus pyogenes NOT DETECTED NOT DETECTED Final   A.calcoaceticus-baumannii NOT DETECTED NOT DETECTED Final   Bacteroides fragilis NOT DETECTED NOT DETECTED Final   Enterobacterales NOT DETECTED NOT DETECTED Final   Enterobacter cloacae complex NOT DETECTED NOT DETECTED Final   Escherichia coli NOT DETECTED NOT DETECTED Final   Klebsiella aerogenes NOT DETECTED NOT DETECTED Final   Klebsiella oxytoca NOT DETECTED NOT DETECTED Final   Klebsiella pneumoniae NOT DETECTED NOT DETECTED Final   Proteus species NOT DETECTED NOT DETECTED Final   Salmonella species NOT DETECTED NOT DETECTED Final   Serratia marcescens NOT DETECTED NOT DETECTED Final   Haemophilus influenzae NOT DETECTED NOT DETECTED Final   Neisseria meningitidis NOT DETECTED NOT DETECTED Final   Pseudomonas aeruginosa NOT DETECTED NOT DETECTED Final   Stenotrophomonas maltophilia NOT DETECTED NOT DETECTED Final   Candida albicans NOT DETECTED NOT DETECTED Final   Candida auris NOT DETECTED NOT DETECTED Final   Candida glabrata NOT DETECTED NOT DETECTED Final   Candida krusei NOT DETECTED NOT DETECTED Final   Candida parapsilosis NOT DETECTED NOT DETECTED Final   Candida tropicalis NOT DETECTED NOT DETECTED Final   Cryptococcus neoformans/gattii NOT DETECTED NOT DETECTED Final    Comment: Performed at [facility], [address] [place], [place] [address]  Wet prep, genital     Status: None   Collection Time: 05/12/23  9:47 PM  Result  Value Ref Range Status   Yeast Wet Prep HPF POC NONE SEEN NONE SEEN Final   Trich, Wet Prep NONE SEEN NONE SEEN Final   Clue Cells Wet Prep HPF POC NONE SEEN NONE SEEN Final   WBC, Wet Prep HPF POC <10 <10 Final   Sperm NONE SEEN  Final    Comment: Performed at [facility], [address] [place], [place] [address]         Radiology Studies: ECHOCARDIOGRAM COMPLETE  Result Date: 05/14/2023    ECHOCARDIOGRAM REPORT   Patient Name:   ILENE RUGGLES Date of Exam: 05/14/2023 Medical Rec #:  [id number]         Height:       67.0 in Accession #:    [id number]        Weight:       221.6 lb Date of Birth:  03-24-1956         BSA:          2.112 m Patient Age:    66 years          BP:           141/80 mmHg Patient Gender: F                 HR:           86 bpm. Exam Location:  Inpatient Procedure: 2D Echo, Cardiac Doppler and Color Doppler Indications:    CHF-I50.9  History:        Patient has prior history of Echocardiogram examinations, most                 recent 02/03/2020. CHF, PAD; Risk Factors:Hypertension, Sleep                 Apnea,  Diabetes and Dyslipidemia. CKD, stage 3.  Sonographer:    Lucendia Herrlich Referring Phys: [id number] ANAND D HONGALGI IMPRESSIONS  1. Left ventricular ejection fraction, by estimation, is 60 to 65%. The left ventricle has normal function. The left ventricle has no regional wall motion abnormalities. There is mild left ventricular hypertrophy of the basal-septal segment. Left ventricular diastolic parameters are consistent with Grade I diastolic dysfunction (impaired relaxation).  2. Right ventricular systolic function is normal. The right ventricular size is normal. There is normal pulmonary artery systolic pressure. The estimated right ventricular systolic pressure is 31.8 mmHg.  3. The mitral valve is degenerative. Trivial mitral valve regurgitation. Mild mitral stenosis. The mean mitral valve gradient is 3.6 mmHg with average heart rate of 84 bpm.  Moderate mitral annular calcification.  4. The aortic valve is tricuspid. Aortic valve regurgitation is not visualized.  5. The inferior vena cava is normal in size with <50% respiratory variability, suggesting right atrial pressure of 8 mmHg. Comparison(s): Changes from prior study are noted. 02/03/2020: LVEF 55-60%. FINDINGS  Left Ventricle: Left ventricular ejection fraction, by estimation, is 60 to 65%. The left ventricle has normal function. The left ventricle has no regional wall motion abnormalities. The left ventricular internal cavity size was normal in size. There is  mild left ventricular hypertrophy of the basal-septal segment. Left ventricular diastolic parameters are consistent with Grade I diastolic dysfunction (impaired relaxation). Indeterminate filling pressures. Right Ventricle: The right ventricular size is normal. No increase in right ventricular wall thickness. Right ventricular systolic function is normal. There is normal pulmonary artery systolic pressure. The tricuspid regurgitant velocity is 2.44 m/s, and  with an assumed right atrial pressure of 8 mmHg, the estimated right ventricular systolic pressure is 31.8 mmHg. Left Atrium: Left atrial size was normal in size. Right Atrium: Right atrial size was normal in size. Pericardium: There is no evidence of pericardial effusion. Mitral Valve: The mitral valve is degenerative in appearance. There is mild calcification of the anterior and posterior mitral valve leaflet(s). Moderate mitral annular calcification. Trivial mitral valve regurgitation. Mild mitral valve stenosis. The mean mitral valve gradient is 3.6 mmHg with average heart rate of 84 bpm. Tricuspid Valve: The tricuspid valve is grossly normal. Tricuspid valve regurgitation is trivial. Aortic Valve: The aortic valve is tricuspid. Aortic valve regurgitation is not visualized. Aortic valve peak gradient measures 10.1 mmHg. Pulmonic Valve: The pulmonic valve was normal in structure.  Pulmonic valve regurgitation is not visualized. Aorta: The aortic root and ascending aorta are structurally normal, with no evidence of dilitation. Venous: The inferior vena cava is normal in size with less than 50% respiratory variability, suggesting right atrial pressure of 8 mmHg. IAS/Shunts: No atrial level shunt detected by color flow Doppler.  LEFT VENTRICLE PLAX 2D LVIDd:         3.90 cm   Diastology LVIDs:         2.80 cm   LV e' medial:    7.93 cm/s LV PW:         0.90 cm   LV E/e' medial:  14.6 LV IVS:        1.30 cm   LV e' lateral:   10.90 cm/s LVOT diam:     2.20 cm   LV E/e' lateral: 10.6 LV SV:         77 LV SV Index:   37 LVOT Area:     3.80 cm  RIGHT VENTRICLE  IVC RV S prime:     12.40 cm/s  IVC diam: 1.70 cm TAPSE (M-mode): 2.1 cm LEFT ATRIUM             Index        RIGHT ATRIUM          Index LA diam:        3.70 cm 1.75 cm/m   RA Area:     9.31 cm LA Vol (A2C):   53.3 ml 25.21 ml/m  RA Volume:   15.60 ml 7.39 ml/m LA Vol (A4C):   69.2 ml 32.76 ml/m LA Biplane Vol: 71.5 ml 33.85 ml/m  AORTIC VALVE AV Area (Vmax): 2.93 cm AV Vmax:        159.00 cm/s AV Peak Grad:   10.1 mmHg LVOT Vmax:      122.67 cm/s LVOT Vmean:     78.133 cm/s LVOT VTI:       0.203 m  AORTA Ao Root diam: 3.20 cm Ao Asc diam:  3.70 cm MITRAL VALVE                TRICUSPID VALVE MV Area (PHT): 3.65 cm     TR Peak grad:   23.8 mmHg MV Mean grad:  3.6 mmHg     TR Vmax:        244.00 cm/s MV Decel Time: 208 msec MV E velocity: 116.00 cm/s  SHUNTS MV A velocity: 150.00 cm/s  Systemic VTI:  0.20 m MV E/A ratio:  0.77         Systemic Diam: 2.20 cm Zoila Shutter MD Electronically signed by Zoila Shutter MD Signature Date/Time: 05/14/2023/3:51:40 PM    Final            LOS: 4 days   Time spent= 35 mins    Jahmez Bily Joline Maxcy, MD [facility]  If 7PM-7AM, please contact night-coverage  05/16/2023, 8:52 AM

## 2023-05-16 NOTE — Progress Notes (Signed)
Pharmacy Antibiotic Note  Danielle Schroeder is a 67 y.o. female admitted on 05/12/2023  with right great toe osteomyelitis. Pharmacy has been consulted to dose vancomycin and cefepime. Also on metronidazole.   Vanc levels drawn incorrectly, unable to calculate AUC. Vanc trough is 12 and at goal.  Plan: Continue vancomycin to 750 mg IV q24h   Continue cefepime 2 g IV q12h Monitor renal function, clinical progress, cultures/sensitivities F/U LOT and de-escalate as able Vancomycin levels as clinically indicated   Height: 5\' 7"  (170.2 cm) Weight: 102.6 kg (226 lb 4.8 oz) IBW/kg (Calculated) : 61.6  Temp (24hrs), Avg:98.2 F (36.8 C), Min:97.4 F (36.3 C), Max:99.1 F (37.3 C)  Recent Labs  Lab 05/12/23 1920 05/12/23 2021 05/13/23 1051 05/14/23 0127 05/15/23 0113 05/16/23 0044  WBC 12.3*  --  9.8 9.1 7.6 8.0  CREATININE 1.77*  --  1.36* 1.33* 1.60* 1.63*  LATICACIDVEN  --  1.8  --   --   --   --      Estimated Creatinine Clearance: 42.4 mL/min (A) (by C-G formula based on SCr of 1.63 mg/dL (H)).    No Known Allergies  Antimicrobials this admission: cefepime 6/29 >> vancomycin 6/30 >> metronidazole 6/30 >>  Dose adjustments this admission: 7/2 vanc 1000>750mg  q24hr  7/3 VT: 12 (VP drawn incorrectly)   Microbiology results: 6/29 BCx: 2/4 bottles staph species -contaminant   Thank you for involving pharmacy in this patient's care.  Alphia Moh, PharmD, BCPS, BCCP Clinical Pharmacist  Please check AMION for all [facility] Pharmacy phone numbers After 10:00 PM, call Main Pharmacy [phone]

## 2023-05-16 NOTE — Progress Notes (Signed)
  Progress Note    05/16/2023 11:59 AM * No surgery date entered *  Subjective:  no complaints    Vitals:   05/16/23 0738 05/16/23 1124  BP: (!) 132/43 130/78  Pulse: 97 89  Resp: 19 18  Temp: (!) 97.4 F (36.3 C) 98.2 F (36.8 C)  SpO2: 90% 94%    Physical Exam: General:  resting comfortably Cardiac:  regular Lungs:  nonlabored Extremities:  R GT ulceration  CBC    Component Value Date/Time   WBC 8.0 05/16/2023 0044   RBC 3.21 (L) 05/16/2023 0044   HGB 8.7 (L) 05/16/2023 0044   HGB 10.7 (L) 03/30/2020 1209   HCT 28.2 (L) 05/16/2023 0044   HCT 32.4 (L) 03/30/2020 1209   PLT 213 05/16/2023 0044   PLT 188 03/30/2020 1209   MCV 87.9 05/16/2023 0044   MCV 86 03/30/2020 1209   MCH 27.1 05/16/2023 0044   MCHC 30.9 05/16/2023 0044   RDW 14.9 05/16/2023 0044   RDW 13.9 03/30/2020 1209   LYMPHSABS 0.8 05/13/2023 1051   MONOABS 0.7 05/13/2023 1051   EOSABS 0.1 05/13/2023 1051   BASOSABS 0.0 05/13/2023 1051    BMET    Component Value Date/Time   NA 134 (L) 05/16/2023 0044   NA 142 03/30/2020 1209   K 3.6 05/16/2023 0044   CL 103 05/16/2023 0044   CO2 23 05/16/2023 0044   GLUCOSE 299 (H) 05/16/2023 0044   BUN 19 05/16/2023 0044   BUN 26 03/30/2020 1209   CREATININE 1.63 (H) 05/16/2023 0044   CREATININE 1.44 (H) 05/06/2020 0946   CALCIUM 8.5 (L) 05/16/2023 0044   GFRNONAA 35 (L) 05/16/2023 0044   GFRNONAA 39 (L) 05/06/2020 0946   GFRAA 45 (L) 05/06/2020 0946    INR    Component Value Date/Time   INR 1.2 05/13/2023 1051     Intake/Output Summary (Last 24 hours) at 05/16/2023 1159 Last data filed at 05/16/2023 0924 Gross per 24 hour  Intake 1141.43 ml  Output --  Net 1141.43 ml       Assessment/Plan:  67 y.o. female with right great toe osteomyelitis   -Denies any pain this morning in the right foot -Plan for RLE angiogram today. All questions have been answered and the patient is agreeable   Victorino Sparrow

## 2023-05-16 NOTE — Progress Notes (Signed)
PT Cancellation Note  Patient Details Name: Danielle Schroeder MRN: [id number] DOB: February 17, 1956   Cancelled Treatment:    Reason Eval/Treat Not Completed: Patient at procedure or test/unavailable   Angelina Ok [facility] 05/16/2023, 12:37 PM Skip Mayer PT Acute Rehabilitation Services Office [phone]

## 2023-05-17 DIAGNOSIS — M869 Osteomyelitis, unspecified: Secondary | ICD-10-CM | POA: Diagnosis not present

## 2023-05-17 LAB — BASIC METABOLIC PANEL
Anion gap: 8 (ref 5–15)
BUN: 17 mg/dL (ref 8–23)
CO2: 20 mmol/L — ABNORMAL LOW (ref 22–32)
Calcium: 8.6 mg/dL — ABNORMAL LOW (ref 8.9–10.3)
Chloride: 109 mmol/L (ref 98–111)
Creatinine, Ser: 1.4 mg/dL — ABNORMAL HIGH (ref 0.44–1.00)
GFR, Estimated: 42 mL/min — ABNORMAL LOW (ref >60)
Glucose, Bld: 298 mg/dL — ABNORMAL HIGH (ref 70–99)
Potassium: 3.7 mmol/L (ref 3.5–5.1)
Sodium: 137 mmol/L (ref 135–145)

## 2023-05-17 LAB — LIPID PANEL
Cholesterol: 107 mg/dL (ref 0–200)
HDL: 35 mg/dL — ABNORMAL LOW (ref >40)
LDL Cholesterol: 50 mg/dL (ref 0–99)
Total CHOL/HDL Ratio: 3.1 RATIO
Triglycerides: 110 mg/dL (ref <150)
VLDL: 22 mg/dL (ref 0–40)

## 2023-05-17 LAB — GLUCOSE, CAPILLARY
Glucose-Capillary: 267 mg/dL — ABNORMAL HIGH (ref 70–99)
Glucose-Capillary: 306 mg/dL — ABNORMAL HIGH (ref 70–99)
Glucose-Capillary: 277 mg/dL — ABNORMAL HIGH (ref 70–99)
Glucose-Capillary: 218 mg/dL — ABNORMAL HIGH (ref 70–99)

## 2023-05-17 LAB — CBC
HCT: 28 % — ABNORMAL LOW (ref 36.0–46.0)
Hemoglobin: 8.6 g/dL — ABNORMAL LOW (ref 12.0–15.0)
MCH: 27.1 pg (ref 26.0–34.0)
MCHC: 30.7 g/dL (ref 30.0–36.0)
MCV: 88.3 fL (ref 80.0–100.0)
Platelets: 198 10*3/uL (ref 150–400)
RBC: 3.17 MIL/uL — ABNORMAL LOW (ref 3.87–5.11)
RDW: 15.3 % (ref 11.5–15.5)
WBC: 7.4 10*3/uL (ref 4.0–10.5)
nRBC: 0 % (ref 0.0–0.2)

## 2023-05-17 LAB — MAGNESIUM: Magnesium: 1.8 mg/dL (ref 1.7–2.4)

## 2023-05-17 MED ORDER — AMLODIPINE BESYLATE 5 MG PO TABS
5.0000 mg | ORAL_TABLET | Freq: Every day | ORAL | Status: DC
  Administered 2023-05-17 – 2023-05-21 (×5): 5 mg via ORAL
  Filled 2023-05-17 (×5): qty 1

## 2023-05-17 NOTE — Progress Notes (Addendum)
  Progress Note    05/17/2023 9:50 AM 1 Day Post-Op  Subjective:  no complaints   Vitals:   05/17/23 0324 05/17/23 0718  BP: (!) 149/76 (!) 158/83  Pulse:  91  Resp: 20 19  Temp:  98.5 F (36.9 C)  SpO2: 91% 91%   Physical Exam: Cardiac:  regular Lungs:  non labored Incisions:  left cf access site c/d/I without swelling or hematoma  Extremities:  RLE well perfused and warm with brisk PT and peroneal signals. R great toe dressed Abdomen:  soft Neurologic: alert and oriented  CBC    Component Value Date/Time   WBC 7.4 05/17/2023 0040   RBC 3.17 (L) 05/17/2023 0040   HGB 8.6 (L) 05/17/2023 0040   HGB 10.7 (L) 03/30/2020 1209   HCT 28.0 (L) 05/17/2023 0040   HCT 32.4 (L) 03/30/2020 1209   PLT 198 05/17/2023 0040   PLT 188 03/30/2020 1209   MCV 88.3 05/17/2023 0040   MCV 86 03/30/2020 1209   MCH 27.1 05/17/2023 0040   MCHC 30.7 05/17/2023 0040   RDW 15.3 05/17/2023 0040   RDW 13.9 03/30/2020 1209   LYMPHSABS 0.8 05/13/2023 1051   MONOABS 0.7 05/13/2023 1051   EOSABS 0.1 05/13/2023 1051   BASOSABS 0.0 05/13/2023 1051    BMET    Component Value Date/Time   NA 137 05/17/2023 0040   NA 142 03/30/2020 1209   K 3.7 05/17/2023 0040   CL 109 05/17/2023 0040   CO2 20 (L) 05/17/2023 0040   GLUCOSE 298 (H) 05/17/2023 0040   BUN 17 05/17/2023 0040   BUN 26 03/30/2020 1209   CREATININE 1.40 (H) 05/17/2023 0040   CREATININE 1.44 (H) 05/06/2020 0946   CALCIUM 8.6 (L) 05/17/2023 0040   GFRNONAA 42 (L) 05/17/2023 0040   GFRNONAA 39 (L) 05/06/2020 0946   GFRAA 45 (L) 05/06/2020 0946    INR    Component Value Date/Time   INR 1.2 05/13/2023 1051     Intake/Output Summary (Last 24 hours) at 05/17/2023 0950 Last data filed at 05/17/2023 0837 Gross per 24 hour  Intake 1983.57 ml  Output 700 ml  Net 1283.57 ml     Assessment/Plan:  67 y.o. female is s/p Aortogram, arteriogram with balloon angioplasty of right PT and peroneal arteries 1 Day Post-Op   RLE well  perfused and warm brisk PT/ Pero signals CF access site c/d/I without swelling or hematoma Scr baseline Two vessel runoff in PT/ Pero. Severe microvascular disease Scheduled for right great toe amputation tomorrow with Dr. Edilia Bo Keep NPO Consent order placed    Dory Horn [facility] [phone] 05/17/2023 9:50 AM  I have independently interviewed and examined patient and agree with PA assessment and plan above.  Successful revascularization yesterday now planned for great toe amputation with Dr. Edilia Bo tomorrow in the OR.  All questions were answered she will be n.p.o. past midnight.  Yareth Macdonnell C. Randie Heinz, MD [facility] [address] Office: [phone] Pager: [phone]

## 2023-05-17 NOTE — Progress Notes (Signed)
PROGRESS NOTE    Danielle DANNELLY  Schroeder:[id number] DOB: 09-May-1956 DOA: 05/12/2023 PCP: [name], MD   Brief Narrative:  67 year old female with medical history significant for type II DM/IDDM, PAD with balloon angioplasty and left superficial femoral artery stenting 04/11/2023 by Dr. Nolon Bussing, HTN, HLD, hypothyroid, chronic diastolic CHF, initially presented to the [facility] on 05/12/2023 with complaints of right first toe swelling, redness and pain which started off as an ulcer approximately a month ago and has progressively worsened. Admitted for right diabetic foot infection (first toe cellulitis/abscess and osteomyelitis) complicating underlying poorly controlled type II DM with likely peripheral neuropathy, peripheral artery disease. EDP discussed with orthopedics who recommended vascular surgery consultation given underlying PAD. Discussed with Dr. Myra Gianotti, VVS on-call on 6/30 - Recommending transfer to [facility] for angiogram and Right great toe amputation on    Assessment & Plan:   Principal Problem:   Osteomyelitis of right lower extremity (HCC) Active Problems:   Acquired hypothyroidism   DM2 (diabetes mellitus, type 2) (HCC)   CKD stage 3b, GFR 30-44 ml/min (HCC)   Essential hypertension   HLD (hyperlipidemia)   Sepsis (HCC)   Chronic diastolic CHF (congestive heart failure) (HCC)   History of anemia due to chronic kidney disease  Diabetic foot infection (cellulitis/abscess and osteomyelitis of right first toe) History of peripheral vascular disease status post balloon angioplasty and stenting of left superficial femoral artery 5/29 -Complicated by DM2, peripheral neuropathy and PAD.  X-ray showing concerns of right great toe osteo. -Vascular surgery following, status post angiogram with successful reperfusion  -Tentative plan for right toe amputation 05/18/2023 -Aspirin, statin, empiric vancomycin, cefepime and Flagyl.    Stage IIIb CKD: Creatinine is  stable around 1.33 > 1.6.  Normal saline bolus, hold Lasix   Poorly controlled type II DM with peripheral neuropathy: Hyperglycemia -A1c 12.2.  Sliding scale and Accu-Chek ongoing with hypoglycemic protocol -Semglee 46 units, Premeal NovoLog 6 units -follow closely while NPO perioperatively   Essential hypertension: - Losartan on hold given above -Start amlodipine 5 and follow - Continue Procardia, Coreg  Hyperlipidemia: -Daily Zocor   Hypothyroidism: - Continue levothyroxine 88 mcg daily.   Chronic diastolic CHF: EF 81% Echo in March 2021 showed EF of 65%.  Repeated, showed EF of 65%, grade 1 DD - Continue aspirin, statin   Normocytic anemia, chronic likely of chronic disease(CKD): Hemoglobin near baseline of 9.0   Pseudohyponatremia -Secondary to hyperglycemia.  Estimated body mass index is 35.44 kg/m as calculated from the following:   Height as of this encounter: 5\' 7"  (1.702 m).   Weight as of this encounter: 102.6 kg.   DVT prophylaxis: heparin injection 5,000 Units Start: 05/16/23 2200 SCDs Start: 05/12/23 2243 Code Status:   Code Status: Full Code Family Communication: None present  Status is: Inpatient  Dispo: The patient is from: Home              Anticipated d/c is to: To be determined              Anticipated d/c date is: 48 to 72 hours              Patient currently not medically stable for discharge  Consultants:  Vascular surgery  Procedures:  Angiogram, amputation per surgical schedule  Antimicrobials:  Vancomycin, cefepime  Subjective: No acute issues or events overnight tolerating PT quite well today otherwise denies nausea vomiting diarrhea constipation headache fevers chills or chest pain  Objective: Vitals:  05/16/23 1923 05/16/23 2307 05/17/23 0324 05/17/23 0718  BP: 116/63 (!) 159/82 (!) 149/76 (!) 158/83  Pulse: 78 76  91  Resp: 20 20 20 19   Temp: (!) 97.1 F (36.2 C) 98.1 F (36.7 C)  98.5 F (36.9 C)  TempSrc: Oral Oral   Oral  SpO2: 90% 90% 91% 91%  Weight:      Height:        Intake/Output Summary (Last 24 hours) at 05/17/2023 0812 Last data filed at 05/17/2023 0647 Gross per 24 hour  Intake 1511.57 ml  Output 700 ml  Net 811.57 ml   Filed Weights   05/14/23 0500 05/15/23 0500 05/16/23 0607  Weight: 100.5 kg 99.7 kg 102.6 kg    Examination:  General:  Pleasantly resting in bed, No acute distress. HEENT:  Normocephalic atraumatic.  Sclerae nonicteric, noninjected.  Extraocular movements intact bilaterally. Neck:  Without mass or deformity.  Trachea is midline. Lungs:  Clear to auscultate bilaterally without rhonchi, wheeze, or rales. Heart:  Regular rate and rhythm.  Without murmurs, rubs, or gallops. Abdomen:  Soft, nontender, nondistended.  Without guarding or rebound. Skin: Foot bandage clean dry intact  Data Reviewed: I have personally reviewed following labs and imaging studies  CBC: Recent Labs  Lab 05/13/23 1051 05/14/23 0127 05/15/23 0113 05/16/23 0044 05/17/23 0040  WBC 9.8 9.1 7.6 8.0 7.4  NEUTROABS 8.3*  --   --   --   --   HGB 8.8* 9.1* 9.4* 8.7* 8.6*  HCT 28.2* 29.4* 30.0* 28.2* 28.0*  MCV 86.2 85.0 86.7 87.9 88.3  PLT 200 211 228 213 198   Basic Metabolic Panel: Recent Labs  Lab 05/13/23 1051 05/14/23 0127 05/15/23 0113 05/16/23 0044 05/17/23 0040  NA 132* 133* 136 134* 137  K 3.5 3.3* 3.6 3.6 3.7  CL 99 99 101 103 109  CO2 25 24 25 23  20*  GLUCOSE 317* 213* 270* 299* 298*  BUN 15 13 16 19 17   CREATININE 1.36* 1.33* 1.60* 1.63* 1.40*  CALCIUM 8.7* 8.5* 8.7* 8.5* 8.6*  MG 1.7  --  1.7 1.8 1.8   GFR: Estimated Creatinine Clearance: 49.3 mL/min (A) (by C-G formula based on SCr of 1.4 mg/dL (H)). Liver Function Tests: Recent Labs  Lab 05/13/23 1051  AST 13*  ALT 11  ALKPHOS 107  BILITOT 0.9  PROT 6.4*  ALBUMIN 2.4*   Coagulation Profile: Recent Labs  Lab 05/13/23 1051  INR 1.2   CBG: Recent Labs  Lab 05/16/23 0558 05/16/23 1126 05/16/23 1621  05/16/23 2105 05/17/23 0627  GLUCAP 293* 310* 195* 262* 267*   Lipid Profile: Recent Labs    05/17/23 0040  CHOL 107  HDL 35*  LDLCALC 50  TRIG 086  CHOLHDL 3.1   Anemia Panel: Recent Labs    05/15/23 0113  FOLATE 16.8   Sepsis Labs: Recent Labs  Lab 05/12/23 2021  LATICACIDVEN 1.8    Recent Results (from the past 240 hour(s))  Blood culture (routine x 2)     Status: None (Preliminary result)   Collection Time: 05/12/23  8:21 PM   Specimen: BLOOD  Result Value Ref Range Status   Specimen Description   Final    BLOOD LEFT ANTECUBITAL Performed at [facility], [address], [place], [place] [address]    Special Requests   Final    BOTTLES DRAWN AEROBIC AND ANAEROBIC Blood Culture adequate volume Performed at [facility], [address] [place], [place] [address]  Culture   Final    NO GROWTH 4 DAYS Performed at [facility], [address], [place], [place] [address]    Report Status PENDING  Incomplete  Blood culture (routine x 2)     Status: Abnormal   Collection Time: 05/12/23  8:42 PM   Specimen: BLOOD  Result Value Ref Range Status   Specimen Description   Final    BLOOD RIGHT ANTECUBITAL Performed at [facility], [address], [place], [place] [address]    Special Requests   Final    BOTTLES DRAWN AEROBIC AND ANAEROBIC Blood Culture adequate volume Performed at [facility], [address] [place], [place] [address]    Culture  Setup Time   Final    GRAM POSITIVE COCCI IN CHAINS AEROBIC BOTTLE ONLY CRITICAL RESULT CALLED TO, READ BACK BY AND VERIFIED WITH: PHARMD J. LEDFORD 05/14/23 @ 0425 BY AB Performed at [facility], [address], [place], [place] [address]    Culture (A)  Final    STREPTOCOCCUS CONSTELLATUS STREPTOCOCCI, BETA HEMOLYTIC    Report Status 05/16/2023 FINAL  Final   Organism ID, Bacteria STREPTOCOCCUS CONSTELLATUS  Final       Susceptibility   Streptococcus constellatus - MIC*    PENICILLIN <=0.06 SENSITIVE Sensitive     CEFTRIAXONE <=0.12 SENSITIVE Sensitive     ERYTHROMYCIN <=0.12 SENSITIVE Sensitive     LEVOFLOXACIN 0.5 SENSITIVE Sensitive     VANCOMYCIN 0.5 SENSITIVE Sensitive     * STREPTOCOCCUS CONSTELLATUS  Blood Culture ID Panel (Reflexed)     Status: Abnormal   Collection Time: 05/12/23  8:42 PM  Result Value Ref Range Status   Enterococcus faecalis NOT DETECTED NOT DETECTED Final   Enterococcus Faecium NOT DETECTED NOT DETECTED Final   Listeria monocytogenes NOT DETECTED NOT DETECTED Final   Staphylococcus species NOT DETECTED NOT DETECTED Final   Staphylococcus aureus (BCID) NOT DETECTED NOT DETECTED Final   Staphylococcus epidermidis NOT DETECTED NOT DETECTED Final   Staphylococcus lugdunensis NOT DETECTED NOT DETECTED Final   Streptococcus species DETECTED (A) NOT DETECTED Final    Comment: Not Enterococcus species, Streptococcus agalactiae, Streptococcus pyogenes, or Streptococcus pneumoniae. CRITICAL RESULT CALLED TO, READ BACK BY AND VERIFIED WITH: PHARMD J. LEDFORD 05/14/23 @ 0425 BY AB    Streptococcus agalactiae NOT DETECTED NOT DETECTED Final   Streptococcus pneumoniae NOT DETECTED NOT DETECTED Final   Streptococcus pyogenes NOT DETECTED NOT DETECTED Final   A.calcoaceticus-baumannii NOT DETECTED NOT DETECTED Final   Bacteroides fragilis NOT DETECTED NOT DETECTED Final   Enterobacterales NOT DETECTED NOT DETECTED Final   Enterobacter cloacae complex NOT DETECTED NOT DETECTED Final   Escherichia coli NOT DETECTED NOT DETECTED Final   Klebsiella aerogenes NOT DETECTED NOT DETECTED Final   Klebsiella oxytoca NOT DETECTED NOT DETECTED Final   Klebsiella pneumoniae NOT DETECTED NOT DETECTED Final   Proteus species NOT DETECTED NOT DETECTED Final   Salmonella species NOT DETECTED NOT DETECTED Final   Serratia marcescens NOT DETECTED NOT DETECTED Final   Haemophilus influenzae NOT DETECTED  NOT DETECTED Final   Neisseria meningitidis NOT DETECTED NOT DETECTED Final   Pseudomonas aeruginosa NOT DETECTED NOT DETECTED Final   Stenotrophomonas maltophilia NOT DETECTED NOT DETECTED Final   Candida albicans NOT DETECTED NOT DETECTED Final   Candida auris NOT DETECTED NOT DETECTED Final   Candida glabrata NOT DETECTED NOT DETECTED Final   Candida krusei NOT DETECTED NOT DETECTED Final   Candida parapsilosis NOT  DETECTED NOT DETECTED Final   Candida tropicalis NOT DETECTED NOT DETECTED Final   Cryptococcus neoformans/gattii NOT DETECTED NOT DETECTED Final    Comment: Performed at [facility], [address], [place], [place] [address]  Wet prep, genital     Status: None   Collection Time: 05/12/23  9:47 PM  Result Value Ref Range Status   Yeast Wet Prep HPF POC NONE SEEN NONE SEEN Final   Trich, Wet Prep NONE SEEN NONE SEEN Final   Clue Cells Wet Prep HPF POC NONE SEEN NONE SEEN Final   WBC, Wet Prep HPF POC <10 <10 Final   Sperm NONE SEEN  Final    Comment: Performed at [facility], [address] [place], [place] [address]         Radiology Studies: PERIPHERAL VASCULAR CATHETERIZATION  Result Date: 05/16/2023 Images from the original result were not included.   Patient name: Danielle Schroeder      MRN: [id number]        DOB: 1956-01-12          Sex: female  05/12/2023 - 05/16/2023 Pre-operative Diagnosis: Right lower extremity critical ischemia with tissue loss toe Post-operative diagnosis:  Same Surgeon:  Victorino Sparrow, MD Procedure Performed: 1.  Ultrasound-guided access of the left common femoral artery 2.  Second-order cannulation, right lower extremity angiogram 3.  Third cannulation, right lower extremity angiogram from the superficial femoral artery 4.  Third order cannulation, please order angiogram 5.  Balloon angioplasty 3 x 100 posterior tibial artery, peroneal artery 6.  Moderate sedation time 44, contrast volume 50ml 7.  Device assisted  closure-Pro-glide   Indications: Patient is a 67 year old female well-known to my practice recently had left lower extremity critical ischemia with tissue loss.  She underwent intervention, and this is since healed.  She presented with new right lower extremity tissue loss at the great toe with depressed ABIs, nonpalpable pulse.  After discussing risk and benefits of right lower extremity angiogram in an effort to define and treat lesions to improve distal perfusion for wound healing, [facility] elected to proceed.  Findings: Aortogram: Deferred-see study from last month-no flow-limiting stenosis On the right: Widely patent common iliac artery, external neck artery, hypogastric artery.  Widely patent common femoral artery, profunda, superficial femoral artery, popliteal artery.  Severe tibial disease. Single-vessel inline flow via the peroneal artery. Severe tibial disease with anterior tibial artery occlusion 3 cm from the ostia, peroneal artery with 2, tandem, 80% flow-limiting lesions proximally.  Proximal posterior tibial artery occlusion with reconstitution at the mid posterior tibial artery continuing into the foot via the plantar branches.  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             Procedure:  The patient was identified in the holding area and taken to room 8.  The patient was then placed supine on the table and prepped and draped in the usual sterile fashion.  A time out was called.  Ultrasound was used to evaluate the left common femoral artery.  It was patent .  A digital ultrasound image was acquired.  A micropuncture needle was used to access the left common femoral artery under ultrasound guidance.  An 018 wire was advanced without resistance and a micropuncture sheath was placed.  The 018 wire was removed and a benson wire was placed.  The micropuncture sheath was exchanged for a 5 french sheath.  Angiogram was deferred as 1 was completed last month.  An Omni Flush catheter was used to  traverse the aortic arch.  The Omni Flush catheter was parked in the right common iliac artery and pelvic angiogram followed.  It was then positioned in the right common femoral artery for right lower extremity angiogram to the foot.  See results above.    I elected to attempt intervention on the posterior tibial and peroneal arteries.  A 6 French by 60 cm sheath was brought onto the field and parked in the superficial femoral artery.  Diagnostic angiography followed from the superficial femoral artery to map the popliteal artery, peroneal artery, posterior tibial artery.  The patient was heparinized.  I was able to use a series of wires and catheters to cannulate the posterior tibial artery and cross the occlusion.  The wire was sent below the level of the ankle into a plantar branch.  I followed this with a catheter and angiography to ensure I was true lumen.  I was, angiography from this site showed that the plantar arch was incomplete.  Next, we used a 3 x 100 mm balloon to angioplasty the proximal portion of the posterior tibial artery this was inflated for 3 minutes.  Follow-up angiography demonstrated excellent result with resolution of the occlusion.  The vessel was widely patent.  Next, I pulled the balloon and wire back and recannulated the peroneal artery.  The same 3 x 100 mm balloon was used to plasty the peroneal artery.  Follow-up angiography demonstrated excellent result with resolution of the tandem, flow-limiting stenoses.   This was an excellent result.  Patient was closed with the use of a ProGlide device.  Impression: Recanalization of the posterior tibial artery.  Resolution of flow-limiting stenosis of the peroneal artery.  Two-vessel runoff to the foot with severe microvascular disease appreciated distally.   Fara Olden, MD [facility] Office: [phone]      Scheduled Meds:  aspirin EC  81 mg Oral Daily   carvedilol  25 mg Oral BID WC   heparin   5,000 Units Subcutaneous Q8H   insulin aspart  0-15 Units Subcutaneous TID WC   insulin aspart  0-5 Units Subcutaneous QHS   insulin aspart  6 Units Subcutaneous TID WC   insulin glargine-yfgn  46 Units Subcutaneous Daily   levothyroxine  88 mcg Oral Daily   potassium chloride  10 mEq Oral Daily   simvastatin  20 mg Oral QPM   sodium chloride flush  3 mL Intravenous Q12H   Continuous Infusions:  sodium chloride     ceFEPime (MAXIPIME) IV Stopped (05/16/23 2148)   metronidazole Stopped (05/16/23 2329)   vancomycin Stopped (05/16/23 1515)     LOS: 5 days   Time spent:  Azucena Fallen, DO [facility]  If 7PM-7AM, please contact night-coverage [email or web address]  05/17/2023, 8:12 AM

## 2023-05-17 NOTE — Progress Notes (Signed)
PHARMACIST LIPID MONITORING   Danielle Schroeder is a 67 y.o. female admitted on 05/12/2023 with history of PAD.  Pharmacy has been consulted to optimize lipid-lowering therapy with the indication of secondary prevention for clinical ASCVD.  Recent Labs:  Lipid Panel (last 6 months):   Lab Results  Component Value Date   CHOL 107 05/17/2023   TRIG 110 05/17/2023   HDL 35 (L) 05/17/2023   CHOLHDL 3.1 05/17/2023   VLDL 22 05/17/2023   LDLCALC 50 05/17/2023    Hepatic function panel (last 6 months):   Lab Results  Component Value Date   AST 13 (L) 05/13/2023   ALT 11 05/13/2023   ALKPHOS 107 05/13/2023   BILITOT 0.9 05/13/2023    SCr (since admission):   Serum creatinine: 1.4 mg/dL (H) 56/21/30 8657 Estimated creatinine clearance: 49.3 mL/min (A)  Current therapy and lipid therapy tolerance Current lipid-lowering therapy: zocor Documented or reported allergies or intolerances to lipid-lowering therapies (if applicable): none  Assessment:   Patient prefers no changes in lipid-lowering therapy at this time due to LDL 50  Plan:    1.Statin intensity (high intensity recommended for all patients regardless of the LDL):  No statin changes. The patient is already on a statin.  2.Add ezetimibe (if any one of the following):   Not indicated at this time.  3.Refer to lipid clinic:   No  4.Follow-up with:  Primary care provider - [name], MD  5.Follow-up labs after discharge:  No changes in lipid therapy, repeat a lipid panel in one year.       Greta Doom BS, PharmD, BCPS Clinical Pharmacist 05/17/2023 8:49 AM  Contact: [phone] after 3 PM  "Be curious, not judgmental..." -Debbora Dus

## 2023-05-17 NOTE — Progress Notes (Signed)
Mobility Specialist Progress Note:   05/17/23 1305  Mobility  Activity Ambulated with assistance in hallway  Level of Assistance Contact guard assist, steadying assist  Assistive Device Front wheel walker  Distance Ambulated (ft) 350 ft  Activity Response Tolerated well  Mobility Referral Yes  $Mobility charge 1 Mobility  Mobility Specialist Start Time (ACUTE ONLY) 1238  Mobility Specialist Stop Time (ACUTE ONLY) 1248  Mobility Specialist Time Calculation (min) (ACUTE ONLY) 10 min   Received pt in bed having no complaints and agreeable to mobility. Rest break required during session d/t fatigue otherwise asymptomatic throughout. Pt left in bed w/ call bell in reach and all needs met.   Danielle Schroeder  Mobility Specialist Please contact via [employer] office at [phone]

## 2023-05-17 NOTE — Progress Notes (Signed)
Physical Therapy Treatment Patient Details Name: Danielle Schroeder MRN: [id number] DOB: 24-Jun-1956 Today's Date: 05/17/2023   History of Present Illness Pt is 67 year old presented to [facility] on  05/12/23 for sepsis due to rt great toe osteomyelitis. Pt transferred to [facility] on 05/13/23 for pending vascular intervention. Pt underwent aortogram, arteriogram with balloon angioplasty of right PT and peroneal arteries on 7/3. PMH - DM, PVD, chf, hypothyroid, htn, LE angioplasty and stenting, ckd    PT Comments  Pt to have rt great toe amputation tomorrow so focused on practicing reducing weight bearing on rt forefoot with mobility. Pt isn't having any pain in that foot so difficult for her to reduce that pressure. Anticipate she will need a rolling walker and HHPT at dc but will have to see how she does post op.      Assistance Recommended at Discharge Intermittent Supervision/Assistance  If plan is discharge home, recommend the following:  Can travel by private vehicle    Help with stairs or ramp for entrance;Assist for transportation;A little help with bathing/dressing/bathroom;Assistance with cooking/housework      Equipment Recommendations  Rolling walker (2 wheels)    Recommendations for Other Services       Precautions / Restrictions Precautions Precautions: None Restrictions Weight Bearing Restrictions: No     Mobility  Bed Mobility               General bed mobility comments: Pt sitting EOB    Transfers Overall transfer level: Needs assistance Equipment used: Rolling walker (2 wheels), Straight cane Transfers: Sit to/from Stand Sit to Stand: Supervision           General transfer comment: Verbal cues for hand placement. Emphasized keeping pressure off of rt forefoot in anticipation of toe amputation tomorrow    Ambulation/Gait Ambulation/Gait assistance: Supervision Gait Distance (Feet): 250 Feet Assistive device: Rolling walker (2 wheels), Straight cane Gait  Pattern/deviations: Step-through pattern, Decreased stance time - right, Decreased stride length Gait velocity: decr Gait velocity interpretation: 1.31 - 2.62 ft/sec, indicative of limited community ambulator   General Gait Details: Focused on keeping pressure off of rt forefoot in anticipation of toe amputation tomorrow. Verbal cues needed.   Stairs             Wheelchair Mobility     Tilt Bed    Modified Rankin (Stroke Patients Only)       Balance Overall balance assessment: Mild deficits observed, not formally tested                                          Cognition Arousal/Alertness: Awake/alert Behavior During Therapy: WFL for tasks assessed/performed Overall Cognitive Status: Within Functional Limits for tasks assessed                                          Exercises      General Comments        Pertinent Vitals/Pain      Home Living                          Prior Function            PT Goals (current goals can now be found in the care plan section) Acute  Rehab PT Goals Patient Stated Goal: Return home Progress towards PT goals: Progressing toward goals    Frequency    Min 1X/week      PT Plan Discharge plan needs to be updated    Co-evaluation              AM-PAC PT "6 Clicks" Mobility   Outcome Measure  Help needed turning from your back to your side while in a flat bed without using bedrails?: None Help needed moving from lying on your back to sitting on the side of a flat bed without using bedrails?: None Help needed moving to and from a bed to a chair (including a wheelchair)?: A Little Help needed standing up from a chair using your arms (e.g., wheelchair or bedside chair)?: A Little Help needed to walk in hospital room?: A Little Help needed climbing 3-5 steps with a railing? : A Little 6 Click Score: 20    End of Session   Activity Tolerance: Patient tolerated  treatment well Patient left: in bed;with call bell/phone within reach (sitting EOB)   PT Visit Diagnosis: Other abnormalities of gait and mobility (R26.89)     Time: 1610-9604 PT Time Calculation (min) (ACUTE ONLY): 11 min  Charges:    $Gait Training: 8-22 mins PT General Charges $$ ACUTE PT VISIT: 1 Visit                     [facility] PT Acute Rehabilitation Services Office [phone]    Angelina Ok [facility] 05/17/2023, 10:23 AM

## 2023-05-18 ENCOUNTER — Encounter (HOSPITAL_COMMUNITY)
Admission: EM | Disposition: A | Discharge status: Home Health | Payer: Self-pay | Source: Home / Self Care | Attending: Internal Medicine

## 2023-05-18 ENCOUNTER — Other Ambulatory Visit: Payer: Self-pay

## 2023-05-18 ENCOUNTER — Inpatient Hospital Stay (HOSPITAL_COMMUNITY): Payer: Medicare HMO | Admitting: Anesthesiology

## 2023-05-18 ENCOUNTER — Encounter (HOSPITAL_COMMUNITY): Payer: Self-pay | Admitting: Vascular Surgery

## 2023-05-18 DIAGNOSIS — E1151 Type 2 diabetes mellitus with diabetic peripheral angiopathy without gangrene: Secondary | ICD-10-CM

## 2023-05-18 DIAGNOSIS — I5032 Chronic diastolic (congestive) heart failure: Secondary | ICD-10-CM

## 2023-05-18 DIAGNOSIS — N1832 Chronic kidney disease, stage 3b: Secondary | ICD-10-CM

## 2023-05-18 DIAGNOSIS — M869 Osteomyelitis, unspecified: Secondary | ICD-10-CM | POA: Diagnosis not present

## 2023-05-18 DIAGNOSIS — I130 Hypertensive heart and chronic kidney disease with heart failure and stage 1 through stage 4 chronic kidney disease, or unspecified chronic kidney disease: Secondary | ICD-10-CM

## 2023-05-18 DIAGNOSIS — I96 Gangrene, not elsewhere classified: Secondary | ICD-10-CM

## 2023-05-18 DIAGNOSIS — Z794 Long term (current) use of insulin: Secondary | ICD-10-CM

## 2023-05-18 HISTORY — PX: AMPUTATION: SHX166

## 2023-05-18 LAB — GLUCOSE, CAPILLARY
Glucose-Capillary: 212 mg/dL — ABNORMAL HIGH (ref 70–99)
Glucose-Capillary: 200 mg/dL — ABNORMAL HIGH (ref 70–99)
Glucose-Capillary: 200 mg/dL — ABNORMAL HIGH (ref 70–99)
Glucose-Capillary: 171 mg/dL — ABNORMAL HIGH (ref 70–99)
Glucose-Capillary: 200 mg/dL — ABNORMAL HIGH (ref 70–99)
Glucose-Capillary: 179 mg/dL — ABNORMAL HIGH (ref 70–99)
Glucose-Capillary: 236 mg/dL — ABNORMAL HIGH (ref 70–99)

## 2023-05-18 LAB — MAGNESIUM: Magnesium: 1.9 mg/dL (ref 1.7–2.4)

## 2023-05-18 LAB — BASIC METABOLIC PANEL
Anion gap: 6 (ref 5–15)
BUN: 15 mg/dL (ref 8–23)
CO2: 23 mmol/L (ref 22–32)
Calcium: 8.8 mg/dL — ABNORMAL LOW (ref 8.9–10.3)
Chloride: 109 mmol/L (ref 98–111)
Creatinine, Ser: 1.37 mg/dL — ABNORMAL HIGH (ref 0.44–1.00)
GFR, Estimated: 43 mL/min — ABNORMAL LOW (ref >60)
Glucose, Bld: 202 mg/dL — ABNORMAL HIGH (ref 70–99)
Potassium: 3.6 mmol/L (ref 3.5–5.1)
Sodium: 138 mmol/L (ref 135–145)

## 2023-05-18 LAB — CBC
HCT: 28.5 % — ABNORMAL LOW (ref 36.0–46.0)
Hemoglobin: 8.8 g/dL — ABNORMAL LOW (ref 12.0–15.0)
MCH: 27.1 pg (ref 26.0–34.0)
MCHC: 30.9 g/dL (ref 30.0–36.0)
MCV: 87.7 fL (ref 80.0–100.0)
Platelets: 211 10*3/uL (ref 150–400)
RBC: 3.25 MIL/uL — ABNORMAL LOW (ref 3.87–5.11)
RDW: 15.6 % — ABNORMAL HIGH (ref 11.5–15.5)
WBC: 7.9 10*3/uL (ref 4.0–10.5)
nRBC: 0 % (ref 0.0–0.2)

## 2023-05-18 LAB — SURGICAL PCR SCREEN
MRSA, PCR: NEGATIVE
Staphylococcus aureus: POSITIVE — AB

## 2023-05-18 SURGERY — AMPUTATION DIGIT
Anesthesia: Monitor Anesthesia Care | Laterality: Right

## 2023-05-18 MED ORDER — PROPOFOL 10 MG/ML IV BOLUS
INTRAVENOUS | Status: AC
  Filled 2023-05-18: qty 20

## 2023-05-18 MED ORDER — MIDAZOLAM HCL 2 MG/2ML IJ SOLN
INTRAMUSCULAR | Status: AC
  Administered 2023-05-18: 1 mg via INTRAVENOUS
  Filled 2023-05-18: qty 2

## 2023-05-18 MED ORDER — ONDANSETRON HCL 4 MG/2ML IJ SOLN
INTRAMUSCULAR | Status: DC | PRN
  Administered 2023-05-18: 4 mg via INTRAVENOUS

## 2023-05-18 MED ORDER — FENTANYL CITRATE (PF) 100 MCG/2ML IJ SOLN: 50.0000 ug | Freq: Once | INTRAMUSCULAR | Status: AC

## 2023-05-18 MED ORDER — FENTANYL CITRATE (PF) 100 MCG/2ML IJ SOLN
INTRAMUSCULAR | Status: AC
  Administered 2023-05-18: 50 ug via INTRAVENOUS
  Filled 2023-05-18: qty 2

## 2023-05-18 MED ORDER — ORAL CARE MOUTH RINSE: 15.0000 mL | Freq: Once | OROMUCOSAL | Status: AC

## 2023-05-18 MED ORDER — BUPIVACAINE-EPINEPHRINE (PF) 0.5% -1:200000 IJ SOLN
INTRAMUSCULAR | Status: DC | PRN
  Administered 2023-05-18: 30 mL

## 2023-05-18 MED ORDER — FENTANYL CITRATE (PF) 100 MCG/2ML IJ SOLN: 25.0000 ug | INTRAMUSCULAR | Status: DC | PRN

## 2023-05-18 MED ORDER — MUPIROCIN 2 % EX OINT
1.0000 | TOPICAL_OINTMENT | Freq: Two times a day (BID) | CUTANEOUS | Status: DC
  Administered 2023-05-18 – 2023-05-21 (×6): 1 via NASAL
  Filled 2023-05-18: qty 22

## 2023-05-18 MED ORDER — LACTATED RINGERS IV SOLN: INTRAVENOUS | Status: DC

## 2023-05-18 MED ORDER — 0.9 % SODIUM CHLORIDE (POUR BTL) OPTIME
TOPICAL | Status: DC | PRN
  Administered 2023-05-18: 1000 mL

## 2023-05-18 MED ORDER — MIDAZOLAM HCL 5 MG/5ML IJ SOLN
INTRAMUSCULAR | Status: DC | PRN
  Administered 2023-05-18: 1 mg via INTRAVENOUS

## 2023-05-18 MED ORDER — PROPOFOL 500 MG/50ML IV EMUL
INTRAVENOUS | Status: DC | PRN
  Administered 2023-05-18: 50 ug/kg/min via INTRAVENOUS

## 2023-05-18 MED ORDER — MIDAZOLAM HCL 2 MG/2ML IJ SOLN: 1.0000 mg | Freq: Once | INTRAMUSCULAR | Status: AC

## 2023-05-18 MED ORDER — LIDOCAINE 2% (20 MG/ML) 5 ML SYRINGE
INTRAMUSCULAR | Status: DC | PRN
  Administered 2023-05-18: 20 mg via INTRAVENOUS

## 2023-05-18 MED ORDER — ACETAMINOPHEN 500 MG PO TABS
1000.0000 mg | ORAL_TABLET | Freq: Once | ORAL | Status: AC
  Administered 2023-05-18: 1000 mg via ORAL
  Filled 2023-05-18: qty 2

## 2023-05-18 MED ORDER — FENTANYL CITRATE (PF) 250 MCG/5ML IJ SOLN
INTRAMUSCULAR | Status: AC
  Filled 2023-05-18: qty 5

## 2023-05-18 MED ORDER — CHLORHEXIDINE GLUCONATE CLOTH 2 % EX PADS
6.0000 | MEDICATED_PAD | Freq: Every day | CUTANEOUS | Status: DC
  Administered 2023-05-19 – 2023-05-21 (×3): 6 via TOPICAL

## 2023-05-18 MED ORDER — MIDAZOLAM HCL 2 MG/2ML IJ SOLN
INTRAMUSCULAR | Status: AC
  Filled 2023-05-18: qty 2

## 2023-05-18 MED ORDER — CHLORHEXIDINE GLUCONATE 0.12 % MT SOLN
15.0000 mL | Freq: Once | OROMUCOSAL | Status: AC
  Administered 2023-05-18: 15 mL via OROMUCOSAL
  Filled 2023-05-18: qty 15

## 2023-05-18 MED ORDER — LACTATED RINGERS IV SOLN: INTRAVENOUS | Status: DC | PRN

## 2023-05-18 MED ORDER — INSULIN ASPART 100 UNIT/ML IJ SOLN
0.0000 [IU] | INTRAMUSCULAR | Status: DC | PRN
  Administered 2023-05-18: 4 [IU] via SUBCUTANEOUS

## 2023-05-18 MED ORDER — AMISULPRIDE (ANTIEMETIC) 5 MG/2ML IV SOLN: 10.0000 mg | Freq: Once | INTRAVENOUS | Status: DC | PRN

## 2023-05-18 MED ORDER — CLONIDINE HCL (ANALGESIA) 100 MCG/ML EP SOLN
EPIDURAL | Status: DC | PRN
  Administered 2023-05-18 (×2): 50 ug

## 2023-05-18 MED ORDER — INSULIN ASPART 100 UNIT/ML IJ SOLN
INTRAMUSCULAR | Status: AC
  Administered 2023-05-18: 3 [IU] via SUBCUTANEOUS
  Filled 2023-05-18: qty 1

## 2023-05-18 MED ORDER — ROPIVACAINE HCL 5 MG/ML IJ SOLN
INTRAMUSCULAR | Status: DC | PRN
  Administered 2023-05-18: 20 mL via PERINEURAL

## 2023-05-18 SURGICAL SUPPLY — 36 items
BAG COUNTER SPONGE SURGICOUNT (BAG) ×1 IMPLANT
BAG SPNG CNTER NS LX DISP (BAG) ×1
BLADE AVERAGE 25X9 (BLADE) IMPLANT
BLADE SAW SGTL 81X20 HD (BLADE) IMPLANT
BNDG ELASTIC 4X5.8 VLCR STR LF (GAUZE/BANDAGES/DRESSINGS) ×1 IMPLANT
BNDG GAUZE DERMACEA FLUFF 4 (GAUZE/BANDAGES/DRESSINGS) ×1 IMPLANT
BNDG GZE DERMACEA 4 6PLY (GAUZE/BANDAGES/DRESSINGS) ×1
CANISTER SUCT 3000ML PPV (MISCELLANEOUS) ×1 IMPLANT
CLIP LIGATING EXTRA MED SLVR (CLIP) ×1 IMPLANT
CLIP LIGATING EXTRA SM BLUE (MISCELLANEOUS) ×1 IMPLANT
COVER SURGICAL LIGHT HANDLE (MISCELLANEOUS) ×1 IMPLANT
DRAPE EXTREMITY T 121X128X90 (DISPOSABLE) ×1 IMPLANT
DRAPE HALF SHEET 40X57 (DRAPES) ×1 IMPLANT
ELECT REM PT RETURN 9FT ADLT (ELECTROSURGICAL) ×1
ELECTRODE REM PT RTRN 9FT ADLT (ELECTROSURGICAL) ×1 IMPLANT
GAUZE SPONGE 4X4 12PLY STRL (GAUZE/BANDAGES/DRESSINGS) ×1 IMPLANT
GAUZE SPONGE 4X4 12PLY STRL LF (GAUZE/BANDAGES/DRESSINGS) IMPLANT
GLOVE BIO SURGEON STRL SZ7.5 (GLOVE) ×1 IMPLANT
GOWN STRL REUS W/ TWL LRG LVL3 (GOWN DISPOSABLE) ×2 IMPLANT
GOWN STRL REUS W/ TWL XL LVL3 (GOWN DISPOSABLE) ×1 IMPLANT
GOWN STRL REUS W/TWL LRG LVL3 (GOWN DISPOSABLE) ×2
GOWN STRL REUS W/TWL XL LVL3 (GOWN DISPOSABLE) ×1
KIT BASIN OR (CUSTOM PROCEDURE TRAY) ×1 IMPLANT
KIT TURNOVER KIT B (KITS) ×1 IMPLANT
NDL HYPO 25GX1X1/2 BEV (NEEDLE) IMPLANT
NEEDLE HYPO 25GX1X1/2 BEV (NEEDLE) IMPLANT
NS IRRIG 1000ML POUR BTL (IV SOLUTION) ×1 IMPLANT
PACK GENERAL/GYN (CUSTOM PROCEDURE TRAY) ×1 IMPLANT
PAD ARMBOARD 7.5X6 YLW CONV (MISCELLANEOUS) ×2 IMPLANT
POWDER SURGICEL 3.0 GRAM (HEMOSTASIS) IMPLANT
SUT ETHILON 3 0 PS 1 (SUTURE) ×1 IMPLANT
SYR CONTROL 10ML LL (SYRINGE) IMPLANT
TOWEL GREEN STERILE (TOWEL DISPOSABLE) ×2 IMPLANT
TUBE CONNECTING 12X1/4 (SUCTIONS) IMPLANT
UNDERPAD 30X36 HEAVY ABSORB (UNDERPADS AND DIAPERS) ×1 IMPLANT
WATER STERILE IRR 1000ML POUR (IV SOLUTION) ×1 IMPLANT

## 2023-05-18 NOTE — Progress Notes (Addendum)
  Progress Note    05/18/2023 12:06 PM Day of Surgery  Subjective: No overnight issues  Vitals:   05/18/23 0925 05/18/23 1055  BP: (!) 150/94 (!) 160/93  Pulse:  82  Resp:  16  Temp:  98.8 F (37.1 C)  SpO2: 93% 94%    Physical Exam: Awake alert and oriented Right great toe with purulent drainage  CBC    Component Value Date/Time   WBC 7.9 05/18/2023 0123   RBC 3.25 (L) 05/18/2023 0123   HGB 8.8 (L) 05/18/2023 0123   HGB 10.7 (L) 03/30/2020 1209   HCT 28.5 (L) 05/18/2023 0123   HCT 32.4 (L) 03/30/2020 1209   PLT 211 05/18/2023 0123   PLT 188 03/30/2020 1209   MCV 87.7 05/18/2023 0123   MCV 86 03/30/2020 1209   MCH 27.1 05/18/2023 0123   MCHC 30.9 05/18/2023 0123   RDW 15.6 (H) 05/18/2023 0123   RDW 13.9 03/30/2020 1209   LYMPHSABS 0.8 05/13/2023 1051   MONOABS 0.7 05/13/2023 1051   EOSABS 0.1 05/13/2023 1051   BASOSABS 0.0 05/13/2023 1051    BMET    Component Value Date/Time   NA 138 05/18/2023 0123   NA 142 03/30/2020 1209   K 3.6 05/18/2023 0123   CL 109 05/18/2023 0123   CO2 23 05/18/2023 0123   GLUCOSE 202 (H) 05/18/2023 0123   BUN 15 05/18/2023 0123   BUN 26 03/30/2020 1209   CREATININE 1.37 (H) 05/18/2023 0123   CREATININE 1.44 (H) 05/06/2020 0946   CALCIUM 8.8 (L) 05/18/2023 0123   GFRNONAA 43 (L) 05/18/2023 0123   GFRNONAA 39 (L) 05/06/2020 0946   GFRAA 45 (L) 05/06/2020 0946    INR    Component Value Date/Time   INR 1.2 05/13/2023 1051     Intake/Output Summary (Last 24 hours) at 05/18/2023 1206 Last data filed at 05/18/2023 0303 Gross per 24 hour  Intake 550 ml  Output 300 ml  Net 250 ml     Assessment:  67 y.o. female is s/p right lower extremity revascularization with posterior tibial artery balloon angioplasty  Plan: OR today for amputation of right great toe   Evalie Hargraves C. Randie Heinz, MD [facility] [place] Office: [phone] Pager: [phone]  05/18/2023 12:06 PM

## 2023-05-18 NOTE — Anesthesia Procedure Notes (Signed)
Procedure Name: MAC Date/Time: 05/18/2023 1:11 PM  Performed by: Dorie Rank, CRNAPre-anesthesia Checklist: Patient identified, Emergency Drugs available, Suction available, Patient being monitored and Timeout performed Patient Re-evaluated:Patient Re-evaluated prior to induction Oxygen Delivery Method: Simple face mask Preoxygenation: Pre-oxygenation with 100% oxygen Induction Type: IV induction Placement Confirmation: positive ETCO2 and breath sounds checked- equal and bilateral Dental Injury: Teeth and Oropharynx as per pre-operative assessment

## 2023-05-18 NOTE — Progress Notes (Signed)
Patient back from procedure, vitals taken, patient denies pain. 

## 2023-05-18 NOTE — Anesthesia Procedure Notes (Signed)
Anesthesia Regional Block: Adductor canal block   Pre-Anesthetic Checklist: , timeout performed,  Correct Patient, Correct Site, Correct Laterality,  Correct Procedure, Correct Position, site marked,  Risks and benefits discussed,  Surgical consent,  Pre-op evaluation,  At surgeon's request and post-op pain management  Laterality: Right  Prep: chloraprep       Needles:  Injection technique: Single-shot  Needle Type: Echogenic Needle     Needle Length: 9cm  Needle Gauge: 21     Additional Needles:   Procedures:,,,, ultrasound used (permanent image in chart),,    Narrative:  Start time: 05/18/2023 11:53 AM End time: 05/18/2023 11:58 AM Injection made incrementally with aspirations every 5 mL.  Performed by: Personally  Anesthesiologist: Marcene Duos, MD

## 2023-05-18 NOTE — Transfer of Care (Signed)
Immediate Anesthesia Transfer of Care Note  Patient: Danielle Schroeder  Procedure(s) Performed: RIGHT GREAT TOE AMPUTATION (Right)  Patient Location: PACU  Anesthesia Type:MAC  Level of Consciousness: awake, alert , and oriented  Airway & Oxygen Therapy: Patient connected to face mask oxygen  Post-op Assessment: Post -op Vital signs reviewed and stable  Post vital signs: stable  Last Vitals:  Vitals Value Taken Time  BP 117/104 05/18/23 1330  Temp    Pulse 80 05/18/23 1331  Resp 27 05/18/23 1331  SpO2 94 % 05/18/23 1331  Vitals shown include unvalidated device data.  Last Pain:  Vitals:   05/18/23 1100  TempSrc:   PainSc: 0-No pain         Complications: No notable events documented.

## 2023-05-18 NOTE — Progress Notes (Signed)
OT Cancellation Note  Patient Details Name: Danielle Schroeder MRN: [id number] DOB: 03/02/1956   Cancelled Treatment:    Reason Eval/Treat Not Completed: Patient at procedure or test/ unavailable.  Patient off the floor for rt great toe amputation.  OT to continue efforts as appropriate.    Tesneem Dufrane D Kazmir Oki 05/18/2023, 11:02 AM 05/18/2023  RP, OTR/L  Acute Rehabilitation Services  Office:  [phone]

## 2023-05-18 NOTE — Op Note (Signed)
    Patient name: Danielle Schroeder MRN: [id number] DOB: 09-May-1956 Sex: female  05/18/2023 Pre-operative Diagnosis: Wet gangrene right great toe Post-operative diagnosis:  Same Surgeon:  [name] C. Randie Heinz, MD Procedure Performed:  Transmetatarsal amputation right great toe  Indications: 67 year old female recently underwent revascularization of her right lower extremity for critical limb ischemia with gangrene of the right great toe.  She is now indicated for great toe amputation.  Findings: The metatarsal bone and surrounding tissue was all healthy and there was adequate bleeding in the wound bed.  Tissue was reapproximated without tension.   Procedure:  The patient was identified in the holding area and taken to the operating where she was placed supine operative table and MAC anesthesia was induced.  Her right foot was sterilely prepped and draped in the usual fashion, antibiotics were updated and a timeout was called.  Tennis racquet type incision was made after the block which was placed preoperatively was noted to be intact.  I dissected down to the bone and remove the toe through the metatarsal phalangeal joint.  I then dissected back on the metatarsal head and transected this.  The bone was then smoothed with rasp.  Hemostasis was obtained the wound was thoroughly irrigated and skin edges were reapproximated with interrupted 3-0 nylon suture.  Sterile dressing was applied.  Patient was awakened from anesthesia having tolerated the procedure without any complication.  All counts were correct at completion  EBL: 20 cc    Marlene Pfluger C. Randie Heinz, MD [facility] [place] Office: [phone] Pager: [phone]

## 2023-05-18 NOTE — Progress Notes (Signed)
PROGRESS NOTE    Danielle Schroeder  ZOX:[id number] DOB: May 17, 1956 DOA: 05/12/2023 PCP: [name], MD   Brief Narrative:  67 year old female with medical history significant for type II DM/IDDM, PAD with balloon angioplasty and left superficial femoral artery stenting 04/11/2023 by Dr. Nolon Bussing, HTN, HLD, hypothyroid, chronic diastolic CHF, initially presented to the [facility] on 05/12/2023 with complaints of right first toe swelling, redness and pain which started off as an ulcer approximately a month ago and has progressively worsened. Admitted for right diabetic foot infection (first toe cellulitis/abscess and osteomyelitis) complicating underlying poorly controlled type II DM with likely peripheral neuropathy, peripheral artery disease. EDP discussed with orthopedics who recommended vascular surgery consultation given underlying PAD. Discussed with Dr. Myra Gianotti, VVS on-call on 6/30 - Recommending transfer to [facility] for angiogram and Right great toe amputation on   Assessment & Plan:   Principal Problem:   Osteomyelitis of right lower extremity (HCC) Active Problems:   Acquired hypothyroidism   DM2 (diabetes mellitus, type 2) (HCC)   CKD stage 3b, GFR 30-44 ml/min (HCC)   Essential hypertension   HLD (hyperlipidemia)   Sepsis (HCC)   Chronic diastolic CHF (congestive heart failure) (HCC)   History of anemia due to chronic kidney disease  Diabetic foot infection (cellulitis/abscess and osteomyelitis of right first toe) History of peripheral vascular disease status post balloon angioplasty and stenting of left superficial femoral artery 5/29 -Complicated by DM2, peripheral neuropathy and PAD.  X-ray showing concerns of right great toe osteo. -Vascular surgery following, status post angiogram with successful reperfusion  -Tentative plan for right toe amputation later this afternoon -hoping for clean margins to limit ongoing need for antibiotics -Aspirin, statin, empiric  vancomycin, cefepime and Flagyl.    Stage IIIb CKD: Creatinine is stable around 1.33 > 1.6.  Normal saline bolus, hold Lasix   Poorly controlled type II DM with peripheral neuropathy: Hyperglycemia -A1c 12.2.  Sliding scale and Accu-Chek ongoing with hypoglycemic protocol -Semglee 46 units, Premeal NovoLog 6 units -follow closely while NPO perioperatively   Essential hypertension: - Losartan on hold given above -Start amlodipine 5 and follow - Continue Procardia, Coreg  Hyperlipidemia: -Daily Zocor   Hypothyroidism: - Continue levothyroxine 88 mcg daily.   Chronic diastolic CHF: EF 81% Echo in March 2021 showed EF of 65%.  Repeated, showed EF of 65%, grade 1 DD - Continue aspirin, statin   Normocytic anemia, chronic likely of chronic disease(CKD): Hemoglobin near baseline of 9.0   Pseudohyponatremia -Secondary to hyperglycemia.  Estimated body mass index is 35.44 kg/m as calculated from the following:   Height as of this encounter: 5\' 7"  (1.702 m).   Weight as of this encounter: 102.6 kg.   DVT prophylaxis: heparin injection 5,000 Units Start: 05/16/23 2200 SCDs Start: 05/12/23 2243 Code Status:   Code Status: Full Code Family Communication: None present  Status is: Inpatient  Dispo: The patient is from: Home              Anticipated d/c is to: To be determined              Anticipated d/c date is: 48 to 72 hours              Patient currently not medically stable for discharge  Consultants:  Vascular surgery  Procedures:  Angiogram, amputation per surgical schedule  Antimicrobials:  Vancomycin, cefepime  Subjective: No acute issues or events overnight denies nausea vomiting diarrhea constipation headache fevers chills or chest pain.  Somewhat anxious for surgery.  Objective: Vitals:   05/17/23 1632 05/17/23 1951 05/17/23 2300 05/18/23 0351  BP: (!) 179/92 (!) 154/91 (!) 142/87 (!) 161/88  Pulse: 90 97 88 82  Resp: 18 18 18 18   Temp: 97.8 F (36.6 C)  97.7 F (36.5 C) 98 F (36.7 C) 97.7 F (36.5 C)  TempSrc: Oral Oral Oral Oral  SpO2: 92% 99% 99% 95%  Weight:      Height:        Intake/Output Summary (Last 24 hours) at 05/18/2023 0742 Last data filed at 05/18/2023 0303 Gross per 24 hour  Intake 1022 ml  Output 300 ml  Net 722 ml    Filed Weights   05/14/23 0500 05/15/23 0500 05/16/23 0607  Weight: 100.5 kg 99.7 kg 102.6 kg    Examination:  General:  Pleasantly resting in bed, No acute distress. HEENT:  Normocephalic atraumatic.  Sclerae nonicteric, noninjected.  Extraocular movements intact bilaterally. Neck:  Without mass or deformity.  Trachea is midline. Lungs:  Clear to auscultate bilaterally without rhonchi, wheeze, or rales. Heart:  Regular rate and rhythm.  Without murmurs, rubs, or gallops. Abdomen:  Soft, nontender, nondistended.  Without guarding or rebound. Skin: Foot bandage clean dry intact  Data Reviewed: I have personally reviewed following labs and imaging studies  CBC: Recent Labs  Lab 05/13/23 1051 05/14/23 0127 05/15/23 0113 05/16/23 0044 05/17/23 0040 05/18/23 0123  WBC 9.8 9.1 7.6 8.0 7.4 7.9  NEUTROABS 8.3*  --   --   --   --   --   HGB 8.8* 9.1* 9.4* 8.7* 8.6* 8.8*  HCT 28.2* 29.4* 30.0* 28.2* 28.0* 28.5*  MCV 86.2 85.0 86.7 87.9 88.3 87.7  PLT 200 211 228 213 198 211    Basic Metabolic Panel: Recent Labs  Lab 05/13/23 1051 05/14/23 0127 05/15/23 0113 05/16/23 0044 05/17/23 0040 05/18/23 0123  NA 132* 133* 136 134* 137 138  K 3.5 3.3* 3.6 3.6 3.7 3.6  CL 99 99 101 103 109 109  CO2 25 24 25 23  20* 23  GLUCOSE 317* 213* 270* 299* 298* 202*  BUN 15 13 16 19 17 15   CREATININE 1.36* 1.33* 1.60* 1.63* 1.40* 1.37*  CALCIUM 8.7* 8.5* 8.7* 8.5* 8.6* 8.8*  MG 1.7  --  1.7 1.8 1.8 1.9    GFR: Estimated Creatinine Clearance: 50.4 mL/min (A) (by C-G formula based on SCr of 1.37 mg/dL (H)). Liver Function Tests: Recent Labs  Lab 05/13/23 1051  AST 13*  ALT 11  ALKPHOS 107   BILITOT 0.9  PROT 6.4*  ALBUMIN 2.4*    Coagulation Profile: Recent Labs  Lab 05/13/23 1051  INR 1.2    CBG: Recent Labs  Lab 05/17/23 0627 05/17/23 1110 05/17/23 1630 05/17/23 2121 05/18/23 0610  GLUCAP 267* 306* 277* 218* 212*    Lipid Profile: Recent Labs    05/17/23 0040  CHOL 107  HDL 35*  LDLCALC 50  TRIG 161  CHOLHDL 3.1    Anemia Panel: No results for input(s): "VITAMINB12", "FOLATE", "FERRITIN", "TIBC", "IRON", "RETICCTPCT" in the last 72 hours.  Sepsis Labs: Recent Labs  Lab 05/12/23 2021  LATICACIDVEN 1.8     Recent Results (from the past 240 hour(s))  Blood culture (routine x 2)     Status: None   Collection Time: 05/12/23  8:21 PM   Specimen: BLOOD  Result Value Ref Range Status   Specimen Description   Final    BLOOD LEFT ANTECUBITAL Performed at [facility], [address] [place], [place] [address]    Special Requests   Final    BOTTLES DRAWN AEROBIC AND ANAEROBIC Blood Culture adequate volume Performed at [facility], [address], [place], [place] [address]    Culture   Final    NO GROWTH 5 DAYS Performed at [facility], [address], [place], [place] [address]    Report Status 05/17/2023 FINAL  Final  Blood culture (routine x 2)     Status: Abnormal   Collection Time: 05/12/23  8:42 PM   Specimen: BLOOD  Result Value Ref Range Status   Specimen Description   Final    BLOOD RIGHT ANTECUBITAL Performed at [facility], [address] [place], [place] [address]    Special Requests   Final    BOTTLES DRAWN AEROBIC AND ANAEROBIC Blood Culture adequate volume Performed at [facility], [address], [place], [place] [address]    Culture  Setup Time   Final    GRAM POSITIVE COCCI IN CHAINS AEROBIC BOTTLE ONLY CRITICAL RESULT CALLED TO, READ BACK BY AND VERIFIED WITH: PHARMD J. LEDFORD 05/14/23 @ 0425 BY AB Performed at [facility], [address], [place], [place] [address]    Culture (A)  Final    STREPTOCOCCUS CONSTELLATUS STREPTOCOCCI, BETA HEMOLYTIC    Report Status 05/16/2023 FINAL  Final   Organism ID, Bacteria STREPTOCOCCUS CONSTELLATUS  Final      Susceptibility   Streptococcus constellatus - MIC*    PENICILLIN <=0.06 SENSITIVE Sensitive     CEFTRIAXONE <=0.12 SENSITIVE Sensitive     ERYTHROMYCIN <=0.12 SENSITIVE Sensitive     LEVOFLOXACIN 0.5 SENSITIVE Sensitive     VANCOMYCIN 0.5 SENSITIVE Sensitive     * STREPTOCOCCUS CONSTELLATUS  Blood Culture ID Panel (Reflexed)     Status: Abnormal   Collection Time: 05/12/23  8:42 PM  Result Value Ref Range Status   Enterococcus faecalis NOT DETECTED NOT DETECTED Final   Enterococcus Faecium NOT DETECTED NOT DETECTED Final   Listeria monocytogenes NOT DETECTED NOT DETECTED Final   Staphylococcus species NOT DETECTED NOT DETECTED Final   Staphylococcus aureus (BCID) NOT DETECTED NOT DETECTED Final   Staphylococcus epidermidis NOT DETECTED NOT DETECTED Final   Staphylococcus lugdunensis NOT DETECTED NOT DETECTED Final   Streptococcus species DETECTED (A) NOT DETECTED Final    Comment: Not Enterococcus species, Streptococcus agalactiae, Streptococcus pyogenes, or Streptococcus pneumoniae. CRITICAL RESULT CALLED TO, READ BACK BY AND VERIFIED WITH: PHARMD J. LEDFORD 05/14/23 @ 0425 BY AB    Streptococcus agalactiae NOT DETECTED NOT DETECTED Final   Streptococcus pneumoniae NOT DETECTED NOT DETECTED Final   Streptococcus pyogenes NOT DETECTED NOT DETECTED Final   A.calcoaceticus-baumannii NOT DETECTED NOT DETECTED Final   Bacteroides fragilis NOT DETECTED NOT DETECTED Final   Enterobacterales NOT DETECTED NOT DETECTED Final   Enterobacter cloacae complex NOT DETECTED NOT DETECTED Final   Escherichia coli NOT DETECTED NOT DETECTED Final   Klebsiella aerogenes NOT DETECTED NOT DETECTED Final   Klebsiella oxytoca NOT DETECTED NOT DETECTED Final    Klebsiella pneumoniae NOT DETECTED NOT DETECTED Final   Proteus species NOT DETECTED NOT DETECTED Final   Salmonella species NOT DETECTED NOT DETECTED Final   Serratia marcescens NOT DETECTED NOT DETECTED Final   Haemophilus influenzae NOT DETECTED NOT DETECTED Final   Neisseria meningitidis NOT DETECTED NOT DETECTED Final   Pseudomonas aeruginosa NOT DETECTED NOT DETECTED Final   Stenotrophomonas maltophilia  NOT DETECTED NOT DETECTED Final   Candida albicans NOT DETECTED NOT DETECTED Final   Candida auris NOT DETECTED NOT DETECTED Final   Candida glabrata NOT DETECTED NOT DETECTED Final   Candida krusei NOT DETECTED NOT DETECTED Final   Candida parapsilosis NOT DETECTED NOT DETECTED Final   Candida tropicalis NOT DETECTED NOT DETECTED Final   Cryptococcus neoformans/gattii NOT DETECTED NOT DETECTED Final    Comment: Performed at [facility], [address] [place], [place] [address]  Wet prep, genital     Status: None   Collection Time: 05/12/23  9:47 PM  Result Value Ref Range Status   Yeast Wet Prep HPF POC NONE SEEN NONE SEEN Final   Trich, Wet Prep NONE SEEN NONE SEEN Final   Clue Cells Wet Prep HPF POC NONE SEEN NONE SEEN Final   WBC, Wet Prep HPF POC <10 <10 Final   Sperm NONE SEEN  Final    Comment: Performed at [facility], [address] [place], [place] [address]         Radiology Studies: PERIPHERAL VASCULAR CATHETERIZATION  Result Date: 05/16/2023 Images from the original result were not included.   Patient name: Danielle Schroeder      MRN: [id number]        DOB: November 20, 1955          Sex: female  05/12/2023 - 05/16/2023 Pre-operative Diagnosis: Right lower extremity critical ischemia with tissue loss toe Post-operative diagnosis:  Same Surgeon:  Victorino Sparrow, MD Procedure Performed: 1.  Ultrasound-guided access of the left common femoral artery 2.  Second-order cannulation, right lower extremity angiogram 3.  Third cannulation, right lower  extremity angiogram from the superficial femoral artery 4.  Third order cannulation, please order angiogram 5.  Balloon angioplasty 3 x 100 posterior tibial artery, peroneal artery 6.  Moderate sedation time 44, contrast volume 50ml 7.  Device assisted closure-Pro-glide   Indications: Patient is a 67 year old female well-known to my practice recently had left lower extremity critical ischemia with tissue loss.  She underwent intervention, and this is since healed.  She presented with new right lower extremity tissue loss at the great toe with depressed ABIs, nonpalpable pulse.  After discussing risk and benefits of right lower extremity angiogram in an effort to define and treat lesions to improve distal perfusion for wound healing, Jametta elected to proceed.  Findings: Aortogram: Deferred-see study from last month-no flow-limiting stenosis On the right: Widely patent common iliac artery, external neck artery, hypogastric artery.  Widely patent common femoral artery, profunda, superficial femoral artery, popliteal artery.  Severe tibial disease. Single-vessel inline flow via the peroneal artery. Severe tibial disease with anterior tibial artery occlusion 3 cm from the ostia, peroneal artery with 2, tandem, 80% flow-limiting lesions proximally.  Proximal posterior tibial artery occlusion with reconstitution at the mid posterior tibial artery continuing into the foot via the plantar branches.  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             Procedure:  The patient was identified in the holding area and taken to room 8.  The patient was then placed supine on the table and prepped and draped in the usual sterile fashion.  A time out was called.  Ultrasound was used to evaluate the left common femoral artery.  It was patent .  A digital ultrasound image was acquired.  A micropuncture needle was used to access the left common femoral artery under ultrasound guidance.  An  018 wire was advanced without resistance and  a micropuncture sheath was placed.  The 018 wire was removed and a benson wire was placed.  The micropuncture sheath was exchanged for a 5 french sheath.  Angiogram was deferred as 1 was completed last month.  An Omni Flush catheter was used to traverse the aortic arch.  The Omni Flush catheter was parked in the right common iliac artery and pelvic angiogram followed.  It was then positioned in the right common femoral artery for right lower extremity angiogram to the foot.  See results above.    I elected to attempt intervention on the posterior tibial and peroneal arteries.  A 6 French by 60 cm sheath was brought onto the field and parked in the superficial femoral artery.  Diagnostic angiography followed from the superficial femoral artery to map the popliteal artery, peroneal artery, posterior tibial artery.  The patient was heparinized.  I was able to use a series of wires and catheters to cannulate the posterior tibial artery and cross the occlusion.  The wire was sent below the level of the ankle into a plantar branch.  I followed this with a catheter and angiography to ensure I was true lumen.  I was, angiography from this site showed that the plantar arch was incomplete.  Next, we used a 3 x 100 mm balloon to angioplasty the proximal portion of the posterior tibial artery this was inflated for 3 minutes.  Follow-up angiography demonstrated excellent result with resolution of the occlusion.  The vessel was widely patent.  Next, I pulled the balloon and wire back and recannulated the peroneal artery.  The same 3 x 100 mm balloon was used to plasty the peroneal artery.  Follow-up angiography demonstrated excellent result with resolution of the tandem, flow-limiting stenoses.   This was an excellent result.  Patient was closed with the use of a ProGlide device.  Impression: Recanalization of the posterior tibial artery.  Resolution of flow-limiting stenosis of the peroneal artery.  Two-vessel runoff to the foot  with severe microvascular disease appreciated distally.   Fara Olden, MD [facility] [place] Office: [phone]      Scheduled Meds:  amLODipine  5 mg Oral Daily   aspirin EC  81 mg Oral Daily   carvedilol  25 mg Oral BID WC   heparin  5,000 Units Subcutaneous Q8H   insulin aspart  0-15 Units Subcutaneous TID WC   insulin aspart  0-5 Units Subcutaneous QHS   insulin aspart  6 Units Subcutaneous TID WC   insulin glargine-yfgn  46 Units Subcutaneous Daily   levothyroxine  88 mcg Oral Daily   potassium chloride  10 mEq Oral Daily   simvastatin  20 mg Oral QPM   sodium chloride flush  3 mL Intravenous Q12H   Continuous Infusions:  sodium chloride     ceFEPime (MAXIPIME) IV Stopped (05/17/23 2215)   metronidazole Stopped (05/18/23 0001)   vancomycin Stopped (05/17/23 1240)     LOS: 6 days   Time spent:  Azucena Fallen, DO [facility]  If 7PM-7AM, please contact night-coverage [email or web address]  05/18/2023, 7:42 AM

## 2023-05-18 NOTE — Anesthesia Procedure Notes (Signed)
Anesthesia Regional Block: Popliteal block   Pre-Anesthetic Checklist: , timeout performed,  Correct Patient, Correct Site, Correct Laterality,  Correct Procedure, Correct Position, site marked,  Risks and benefits discussed,  Surgical consent,  Pre-op evaluation,  At surgeon's request and post-op pain management  Laterality: Right  Prep: chloraprep       Needles:  Injection technique: Single-shot  Needle Type: Echogenic Needle     Needle Length: 9cm  Needle Gauge: 21     Additional Needles:   Procedures:,,,, ultrasound used (permanent image in chart),,    Narrative:  Start time: 05/18/2023 11:45 AM End time: 05/18/2023 11:53 AM Injection made incrementally with aspirations every 5 mL.  Performed by: Personally  Anesthesiologist: Marcene Duos, MD

## 2023-05-18 NOTE — Anesthesia Preprocedure Evaluation (Addendum)
Anesthesia Evaluation  Patient identified by MRN, date of birth, ID band Patient awake    Reviewed: Allergy & Precautions, NPO status , Patient's Chart, lab work & pertinent test results  Airway Mallampati: III  TM Distance: >3 FB Neck ROM: Full    Dental   Pulmonary sleep apnea    breath sounds clear to auscultation       Cardiovascular hypertension, + Peripheral Vascular Disease and +CHF  + Valvular Problems/Murmurs (Mild MS)  Rhythm:Regular Rate:Normal     Neuro/Psych negative neurological ROS     GI/Hepatic negative GI ROS, Neg liver ROS,,,  Endo/Other  diabetes, Type 2, Insulin DependentHypothyroidism    Renal/GU CRFRenal disease     Musculoskeletal   Abdominal   Peds  Hematology  (+) Blood dyscrasia, anemia   Anesthesia Other Findings   Reproductive/Obstetrics                             Anesthesia Physical Anesthesia Plan  ASA: 3  Anesthesia Plan: MAC and Regional   Post-op Pain Management: Tylenol PO (pre-op)* and Regional block*   Induction:   PONV Risk Score and Plan: 2 and Propofol infusion, Ondansetron and Treatment may vary due to age or medical condition  Airway Management Planned: Natural Airway and Simple Face Mask  Additional Equipment:   Intra-op Plan:   Post-operative Plan:   Informed Consent: I have reviewed the patients History and Physical, chart, labs and discussed the procedure including the risks, benefits and alternatives for the proposed anesthesia with the patient or authorized representative who has indicated his/her understanding and acceptance.       Plan Discussed with: CRNA  Anesthesia Plan Comments:        Anesthesia Quick Evaluation

## 2023-05-19 ENCOUNTER — Encounter (HOSPITAL_COMMUNITY): Payer: Self-pay | Admitting: Vascular Surgery

## 2023-05-19 DIAGNOSIS — M869 Osteomyelitis, unspecified: Secondary | ICD-10-CM | POA: Diagnosis not present

## 2023-05-19 LAB — CBC
HCT: 28.8 % — ABNORMAL LOW (ref 36.0–46.0)
Hemoglobin: 8.9 g/dL — ABNORMAL LOW (ref 12.0–15.0)
MCH: 26.6 pg (ref 26.0–34.0)
MCHC: 30.9 g/dL (ref 30.0–36.0)
MCV: 86.2 fL (ref 80.0–100.0)
Platelets: 226 10*3/uL (ref 150–400)
RBC: 3.34 MIL/uL — ABNORMAL LOW (ref 3.87–5.11)
RDW: 15.7 % — ABNORMAL HIGH (ref 11.5–15.5)
WBC: 9.3 10*3/uL (ref 4.0–10.5)
nRBC: 0 % (ref 0.0–0.2)

## 2023-05-19 LAB — BASIC METABOLIC PANEL
Anion gap: 7 (ref 5–15)
BUN: 13 mg/dL (ref 8–23)
CO2: 22 mmol/L (ref 22–32)
Calcium: 8.9 mg/dL (ref 8.9–10.3)
Chloride: 109 mmol/L (ref 98–111)
Creatinine, Ser: 1.25 mg/dL — ABNORMAL HIGH (ref 0.44–1.00)
GFR, Estimated: 48 mL/min — ABNORMAL LOW (ref >60)
Glucose, Bld: 271 mg/dL — ABNORMAL HIGH (ref 70–99)
Potassium: 4.1 mmol/L (ref 3.5–5.1)
Sodium: 138 mmol/L (ref 135–145)

## 2023-05-19 LAB — GLUCOSE, CAPILLARY
Glucose-Capillary: 234 mg/dL — ABNORMAL HIGH (ref 70–99)
Glucose-Capillary: 237 mg/dL — ABNORMAL HIGH (ref 70–99)
Glucose-Capillary: 240 mg/dL — ABNORMAL HIGH (ref 70–99)
Glucose-Capillary: 223 mg/dL — ABNORMAL HIGH (ref 70–99)

## 2023-05-19 LAB — MAGNESIUM: Magnesium: 1.9 mg/dL (ref 1.7–2.4)

## 2023-05-19 NOTE — Progress Notes (Signed)
Orthopedic Tech Progress Note Patient Details:  Danielle Schroeder 29-Apr-1956 [id number]  Ortho Devices Type of Ortho Device: Darco shoe Ortho Device/Splint Location: RLE Ortho Device/Splint Interventions: Ordered   Post Interventions Patient Tolerated: Other (comment) Instructions Provided: Adjustment of device, Care of device, Poper ambulation with device  Kalyan Barabas 05/19/2023, 11:59 AM

## 2023-05-19 NOTE — Progress Notes (Signed)
Physical Therapy Treatment Patient Details Name: Danielle Schroeder MRN: [id number] DOB: 1955/11/23 Today's Date: 05/19/2023   History of Present Illness Pt is 67 year old presented to [facility] on  05/12/23 for sepsis due to rt great toe osteomyelitis. Pt transferred to [facility] on 05/13/23 for pending vascular intervention. Pt underwent aortogram, arteriogram with balloon angioplasty of right PT and peroneal arteries on 7/3. S/p transmetatarsal amputation R great toe 7/5. PMH - DM, PVD, chf, hypothyroid, htn, LE angioplasty and stenting, ckd    PT Comments  Pt is now s/p R great toe amputation. Pt was able to continue to mobilize without physical assistance at a min guard-supervision level while weight bearing through her R heel in a darco shoe. She does rely on a RW for stability though. She could benefit from follow-up with HHPT to progress her independence and safety with functional mobility. Will continue to follow acutely.     Assistance Recommended at Discharge Intermittent Supervision/Assistance  If plan is discharge home, recommend the following:  Can travel by private vehicle    Help with stairs or ramp for entrance;Assist for transportation;A little help with bathing/dressing/bathroom;Assistance with cooking/housework      Equipment Recommendations  Rolling walker (2 wheels)    Recommendations for Other Services       Precautions / Restrictions Precautions Precautions: Fall Restrictions Weight Bearing Restrictions: Yes RLE Weight Bearing: Weight bearing as tolerated (on heel in darco shoe)     Mobility  Bed Mobility Overal bed mobility: Modified Independent             General bed mobility comments: HOB elevated, no assistance needed.    Transfers Overall transfer level: Needs assistance Equipment used: Rolling walker (2 wheels), Straight cane Transfers: Sit to/from Stand Sit to Stand: Min guard           General transfer comment: Verbal cues for hand placement  with sit <> stand transfers. Min guard for safety, no LOB, darco shoe donned.    Ambulation/Gait Ambulation/Gait assistance: Min guard, Supervision Gait Distance (Feet): 300 Feet Assistive device: Rolling walker (2 wheels) Gait Pattern/deviations: Step-through pattern, Decreased stance time - right, Decreased stride length Gait velocity: decr Gait velocity interpretation: 1.31 - 2.62 ft/sec, indicative of limited community ambulator   General Gait Details: Pt ambulating with R darco shoe donned, resulting in leg length discrepency and thus abnormal gait pattern, but no LOB. Min guard-supervision for safety with cues provided to keep RW proximal. x2 standing rest breaks.   Stairs             Wheelchair Mobility     Tilt Bed    Modified Rankin (Stroke Patients Only)       Balance Overall balance assessment: Needs assistance Sitting-balance support: No upper extremity supported, Feet supported Sitting balance-Leahy Scale: Good     Standing balance support: Bilateral upper extremity supported, During functional activity, Reliant on assistive device for balance Standing balance-Leahy Scale: Poor Standing balance comment: Reliant on RW to ambulate, pt did not want to attempt without RW this date                            Cognition Arousal/Alertness: Awake/alert Behavior During Therapy: WFL for tasks assessed/performed Overall Cognitive Status: Within Functional Limits for tasks assessed  General Comments: Slow processing but may be baseline        Exercises General Exercises - Lower Extremity Ankle Circles/Pumps: AROM, Right, 10 reps, Seated Long Arc Quad: AROM, Right, 10 reps, Seated    General Comments General comments (skin integrity, edema, etc.): encouraged AROM of R leg and elevating R leg at rest, she verbalized understanding      Pertinent Vitals/Pain Pain Assessment Pain Assessment: No/denies  pain    Home Living                          Prior Function            PT Goals (current goals can now be found in the care plan section) Acute Rehab PT Goals Patient Stated Goal: Return home PT Goal Formulation: With patient Time For Goal Achievement: 05/28/23 Potential to Achieve Goals: Good Progress towards PT goals: Progressing toward goals    Frequency    Min 1X/week      PT Plan Current plan remains appropriate    Co-evaluation              AM-PAC PT "6 Clicks" Mobility   Outcome Measure  Help needed turning from your back to your side while in a flat bed without using bedrails?: None Help needed moving from lying on your back to sitting on the side of a flat bed without using bedrails?: None Help needed moving to and from a bed to a chair (including a wheelchair)?: A Little Help needed standing up from a chair using your arms (e.g., wheelchair or bedside chair)?: A Little Help needed to walk in hospital room?: A Little Help needed climbing 3-5 steps with a railing? : A Little 6 Click Score: 20    End of Session Equipment Utilized During Treatment: Gait belt Activity Tolerance: Patient tolerated treatment well Patient left: with call bell/phone within reach;in chair (RN ok with no chair alarm so pt can transfer self to/from bedside commode left very proximal to her) Nurse Communication: Mobility status (RN ok with no chair alarm) PT Visit Diagnosis: Other abnormalities of gait and mobility (R26.89);Unsteadiness on feet (R26.81)     Time: 1240-1303 PT Time Calculation (min) (ACUTE ONLY): 23 min  Charges:    $Gait Training: 8-22 mins $Therapeutic Activity: 8-22 mins PT General Charges $$ ACUTE PT VISIT: 1 Visit                     [name], PT, DPT Acute Rehabilitation Services  Office: [phone]    [name] 05/19/2023, 1:40 PM

## 2023-05-19 NOTE — Progress Notes (Addendum)
PROGRESS NOTE    Danielle Schroeder  WUJ:[id number] DOB: 1956/07/11 DOA: 05/12/2023 PCP: [name], MD   Brief Narrative:  67 year old female with medical history significant for type II DM/IDDM, PAD with balloon angioplasty and left superficial femoral artery stenting 04/11/2023 by Dr. Nolon Bussing, HTN, HLD, hypothyroid, chronic diastolic CHF, initially presented to the [facility] on 05/12/2023 with complaints of right first toe swelling, redness and pain which started off as an ulcer approximately a month ago and has progressively worsened. Admitted for right diabetic foot infection (first toe cellulitis/abscess and osteomyelitis) complicating underlying poorly controlled type II DM with likely peripheral neuropathy, peripheral artery disease. EDP discussed with orthopedics who recommended vascular surgery consultation given underlying PAD. Discussed with Dr. Myra Gianotti, VVS on-call on 6/30 - Recommending transfer to [facility] for angiogram and Right great toe amputation on   Assessment & Plan:   Principal Problem:   Osteomyelitis of right lower extremity (HCC) Active Problems:   Acquired hypothyroidism   DM2 (diabetes mellitus, type 2) (HCC)   CKD stage 3b, GFR 30-44 ml/min (HCC)   Essential hypertension   HLD (hyperlipidemia)   Sepsis (HCC)   Chronic diastolic CHF (congestive heart failure) (HCC)   History of anemia due to chronic kidney disease   Diabetic foot infection (cellulitis/abscess and osteomyelitis of right first toe) History of peripheral vascular disease status post balloon angioplasty and stenting of left superficial femoral artery 5/29 -Complicated by DM2, peripheral neuropathy and PAD.  X-ray showing concerns of right great toe osteo. -Vascular surgery following, status post angiogram with successful reperfusion.   -Patient tolerated right great toe amputation with clean margins -Okay to ambulate if she is heel weight bearing on the right foot in a post  op/darco shoe per vascular -Continue on aspirin, statin, discontinue antibiotics given clean margins    Chronic stage IIIb CKD Creatinine is stable around 1.4 -peak around 1.6 status post fluid bolus Consider resuming diuretics in the next 24 to 48 hours  Uncontrolled type II DM with peripheral neuropathy: Hyperglycemia -A1c 12.2.  Sliding scale and Accu-Chek ongoing with hypoglycemic protocol -Semglee 46 units, Premeal NovoLog 6 units -follow for hypoglycemia/hypoglycemia   Essential hypertension: - Losartan on hold given above -Continue amlodipine, procardia, Coreg  Hyperlipidemia: -Daily Zocor   Hypothyroidism: - Continue levothyroxine 88 mcg daily.   Chronic diastolic CHF: EF 95% Echo in March 2021 showed EF of 65%.  Repeated, showed EF of 65%, grade 1 DD - Continue aspirin, statin   Normocytic anemia, chronic likely of chronic disease(CKD): Hemoglobin near baseline of 9.0   Pseudohyponatremia -Secondary to hyperglycemia.  Estimated body mass index is 35.22 kg/m as calculated from the following:   Height as of this encounter: 5\' 7"  (1.702 m).   Weight as of this encounter: 102 kg.   DVT prophylaxis: heparin injection 5,000 Units Start: 05/16/23 2200 SCDs Start: 05/12/23 2243 Code Status:   Code Status: Full Code Family Communication: None present  Status is: Inpatient  Dispo: The patient is from: Home              Anticipated d/c is to: To be determined              Anticipated d/c date is: 48 to 72 hours              Patient currently not medically stable for discharge  Consultants:  Vascular surgery  Procedures:  Angiogram, amputation per surgical schedule  Antimicrobials:  Vancomycin, cefepime  Subjective: No acute  issues or events overnight, pain currently well-controlled denies nausea vomiting diarrhea constipation headache fevers chills or chest pain  Objective: Vitals:   05/18/23 1627 05/18/23 1956 05/18/23 2315 05/19/23 0515  BP: (!) 147/100  139/81 (!) 161/94 (!) 155/91  Pulse: 77 85 78   Resp: 18 18 16 18   Temp: (!) 97.5 F (36.4 C) (!) 97.4 F (36.3 C) 97.6 F (36.4 C) 97.7 F (36.5 C)  TempSrc: Oral Oral Oral Oral  SpO2: 96% 98% 95% 92%  Weight:    102 kg  Height:        Intake/Output Summary (Last 24 hours) at 05/19/2023 0739 Last data filed at 05/19/2023 0515 Gross per 24 hour  Intake 840 ml  Output --  Net 840 ml    Filed Weights   05/16/23 0607 05/18/23 1055 05/19/23 0515  Weight: 102.6 kg 102.5 kg 102 kg    Examination:  General:  Pleasantly resting in bed, No acute distress. HEENT:  Normocephalic atraumatic.  Sclerae nonicteric, noninjected.  Extraocular movements intact bilaterally. Neck:  Without mass or deformity.  Trachea is midline. Lungs:  Clear to auscultate bilaterally without rhonchi, wheeze, or rales. Heart:  Regular rate and rhythm.  Without murmurs, rubs, or gallops. Abdomen:  Soft, nontender, nondistended.  Without guarding or rebound. Skin: Right foot bandage clean dry intact  Data Reviewed: I have personally reviewed following labs and imaging studies  CBC: Recent Labs  Lab 05/13/23 1051 05/14/23 0127 05/15/23 0113 05/16/23 0044 05/17/23 0040 05/18/23 0123 05/19/23 0100  WBC 9.8   < > 7.6 8.0 7.4 7.9 9.3  NEUTROABS 8.3*  --   --   --   --   --   --   HGB 8.8*   < > 9.4* 8.7* 8.6* 8.8* 8.9*  HCT 28.2*   < > 30.0* 28.2* 28.0* 28.5* 28.8*  MCV 86.2   < > 86.7 87.9 88.3 87.7 86.2  PLT 200   < > 228 213 198 211 226   < > = values in this interval not displayed.    Basic Metabolic Panel: Recent Labs  Lab 05/15/23 0113 05/16/23 0044 05/17/23 0040 05/18/23 0123 05/19/23 0100  NA 136 134* 137 138 138  K 3.6 3.6 3.7 3.6 4.1  CL 101 103 109 109 109  CO2 25 23 20* 23 22  GLUCOSE 270* 299* 298* 202* 271*  BUN 16 19 17 15 13   CREATININE 1.60* 1.63* 1.40* 1.37* 1.25*  CALCIUM 8.7* 8.5* 8.6* 8.8* 8.9  MG 1.7 1.8 1.8 1.9 1.9    GFR: Estimated Creatinine Clearance: 55.1  mL/min (A) (by C-G formula based on SCr of 1.25 mg/dL (H)). Liver Function Tests: Recent Labs  Lab 05/13/23 1051  AST 13*  ALT 11  ALKPHOS 107  BILITOT 0.9  PROT 6.4*  ALBUMIN 2.4*    Coagulation Profile: Recent Labs  Lab 05/13/23 1051  INR 1.2    CBG: Recent Labs  Lab 05/18/23 1331 05/18/23 1422 05/18/23 1624 05/18/23 2104 05/19/23 0639  GLUCAP 200* 171* 179* 236* 234*    Lipid Profile: Recent Labs    05/17/23 0040  CHOL 107  HDL 35*  LDLCALC 50  TRIG 846  CHOLHDL 3.1    Anemia Panel: No results for input(s): "VITAMINB12", "FOLATE", "FERRITIN", "TIBC", "IRON", "RETICCTPCT" in the last 72 hours.  Sepsis Labs: Recent Labs  Lab 05/12/23 2021  LATICACIDVEN 1.8     Recent Results (from the past 240 hour(s))  Blood culture (routine x 2)  Status: None   Collection Time: 05/12/23  8:21 PM   Specimen: BLOOD  Result Value Ref Range Status   Specimen Description   Final    BLOOD LEFT ANTECUBITAL Performed at [facility], [address], [place], [place] [address]    Special Requests   Final    BOTTLES DRAWN AEROBIC AND ANAEROBIC Blood Culture adequate volume Performed at [facility], [address], [place], [place] [address]    Culture   Final    NO GROWTH 5 DAYS Performed at [facility], [address], [place], [place] [address]    Report Status 05/17/2023 FINAL  Final  Blood culture (routine x 2)     Status: Abnormal   Collection Time: 05/12/23  8:42 PM   Specimen: BLOOD  Result Value Ref Range Status   Specimen Description   Final    BLOOD RIGHT ANTECUBITAL Performed at [facility], [address] [place], [place] [address]    Special Requests   Final    BOTTLES DRAWN AEROBIC AND ANAEROBIC Blood Culture adequate volume Performed at [facility], [address], [place], [place] [address]    Culture  Setup Time   Final    GRAM POSITIVE COCCI IN  CHAINS AEROBIC BOTTLE ONLY CRITICAL RESULT CALLED TO, READ BACK BY AND VERIFIED WITH: PHARMD J. LEDFORD 05/14/23 @ 0425 BY AB Performed at [facility], [address], [place], [place] [address]    Culture (A)  Final    STREPTOCOCCUS CONSTELLATUS STREPTOCOCCI, BETA HEMOLYTIC    Report Status 05/16/2023 FINAL  Final   Organism ID, Bacteria STREPTOCOCCUS CONSTELLATUS  Final      Susceptibility   Streptococcus constellatus - MIC*    PENICILLIN <=0.06 SENSITIVE Sensitive     CEFTRIAXONE <=0.12 SENSITIVE Sensitive     ERYTHROMYCIN <=0.12 SENSITIVE Sensitive     LEVOFLOXACIN 0.5 SENSITIVE Sensitive     VANCOMYCIN 0.5 SENSITIVE Sensitive     * STREPTOCOCCUS CONSTELLATUS  Blood Culture ID Panel (Reflexed)     Status: Abnormal   Collection Time: 05/12/23  8:42 PM  Result Value Ref Range Status   Enterococcus faecalis NOT DETECTED NOT DETECTED Final   Enterococcus Faecium NOT DETECTED NOT DETECTED Final   Listeria monocytogenes NOT DETECTED NOT DETECTED Final   Staphylococcus species NOT DETECTED NOT DETECTED Final   Staphylococcus aureus (BCID) NOT DETECTED NOT DETECTED Final   Staphylococcus epidermidis NOT DETECTED NOT DETECTED Final   Staphylococcus lugdunensis NOT DETECTED NOT DETECTED Final   Streptococcus species DETECTED (A) NOT DETECTED Final    Comment: Not Enterococcus species, Streptococcus agalactiae, Streptococcus pyogenes, or Streptococcus pneumoniae. CRITICAL RESULT CALLED TO, READ BACK BY AND VERIFIED WITH: PHARMD J. LEDFORD 05/14/23 @ 0425 BY AB    Streptococcus agalactiae NOT DETECTED NOT DETECTED Final   Streptococcus pneumoniae NOT DETECTED NOT DETECTED Final   Streptococcus pyogenes NOT DETECTED NOT DETECTED Final   A.calcoaceticus-baumannii NOT DETECTED NOT DETECTED Final   Bacteroides fragilis NOT DETECTED NOT DETECTED Final   Enterobacterales NOT DETECTED NOT DETECTED Final   Enterobacter cloacae complex NOT DETECTED NOT DETECTED Final   Escherichia coli  NOT DETECTED NOT DETECTED Final   Klebsiella aerogenes NOT DETECTED NOT DETECTED Final   Klebsiella oxytoca NOT DETECTED NOT DETECTED Final   Klebsiella pneumoniae NOT DETECTED NOT DETECTED Final   Proteus species NOT DETECTED NOT DETECTED Final   Salmonella species NOT DETECTED NOT DETECTED Final   Serratia  marcescens NOT DETECTED NOT DETECTED Final   Haemophilus influenzae NOT DETECTED NOT DETECTED Final   Neisseria meningitidis NOT DETECTED NOT DETECTED Final   Pseudomonas aeruginosa NOT DETECTED NOT DETECTED Final   Stenotrophomonas maltophilia NOT DETECTED NOT DETECTED Final   Candida albicans NOT DETECTED NOT DETECTED Final   Candida auris NOT DETECTED NOT DETECTED Final   Candida glabrata NOT DETECTED NOT DETECTED Final   Candida krusei NOT DETECTED NOT DETECTED Final   Candida parapsilosis NOT DETECTED NOT DETECTED Final   Candida tropicalis NOT DETECTED NOT DETECTED Final   Cryptococcus neoformans/gattii NOT DETECTED NOT DETECTED Final    Comment: Performed at [facility], [address], [place], [place] [address]  Wet prep, genital     Status: None   Collection Time: 05/12/23  9:47 PM  Result Value Ref Range Status   Yeast Wet Prep HPF POC NONE SEEN NONE SEEN Final   Trich, Wet Prep NONE SEEN NONE SEEN Final   Clue Cells Wet Prep HPF POC NONE SEEN NONE SEEN Final   WBC, Wet Prep HPF POC <10 <10 Final   Sperm NONE SEEN  Final    Comment: Performed at [facility], [address], [place], [place] [address]  Surgical pcr screen     Status: Abnormal   Collection Time: 05/18/23  6:23 AM   Specimen: Nasal Mucosa; Nasal Swab  Result Value Ref Range Status   MRSA, PCR NEGATIVE NEGATIVE Final   Staphylococcus aureus POSITIVE (A) NEGATIVE Final    Comment: (NOTE) The Xpert SA Assay (FDA approved for NASAL specimens in patients 65 years of age and older), is one component of a comprehensive surveillance program. It is not intended to diagnose infection  nor to guide or monitor treatment. Performed at [facility], [address], [place], [place] [address]          Radiology Studies: No results found.   Scheduled Meds:  amLODipine  5 mg Oral Daily   aspirin EC  81 mg Oral Daily   carvedilol  25 mg Oral BID WC   Chlorhexidine Gluconate Cloth  6 each Topical Q0600   heparin  5,000 Units Subcutaneous Q8H   insulin aspart  0-15 Units Subcutaneous TID WC   insulin aspart  0-5 Units Subcutaneous QHS   insulin aspart  6 Units Subcutaneous TID WC   insulin glargine-yfgn  46 Units Subcutaneous Daily   levothyroxine  88 mcg Oral Daily   mupirocin ointment  1 Application Nasal BID   potassium chloride  10 mEq Oral Daily   simvastatin  20 mg Oral QPM   sodium chloride flush  3 mL Intravenous Q12H   Continuous Infusions:  sodium chloride     ceFEPime (MAXIPIME) IV 2 g (05/18/23 2209)   metronidazole 500 mg (05/18/23 2250)   vancomycin 750 mg (05/18/23 1607)     LOS: 7 days   Time spent:  Azucena Fallen, DO [facility]  If 7PM-7AM, please contact night-coverage [email or web address]  05/19/2023, 7:39 AM

## 2023-05-19 NOTE — Progress Notes (Addendum)
  Progress Note    05/19/2023 7:28 AM 1 Day Post-Op  Subjective:  no complaints. No pain in the right foot    Vitals:   05/18/23 2315 05/19/23 0515  BP: (!) 161/94 (!) 155/91  Pulse: 78   Resp: 16 18  Temp: 97.6 F (36.4 C) 97.7 F (36.5 C)  SpO2: 95% 92%    Physical Exam: General:  no acute distress Cardiac:  regular Extremities:  R foot dressed and dry  CBC    Component Value Date/Time   WBC 9.3 05/19/2023 0100   RBC 3.34 (L) 05/19/2023 0100   HGB 8.9 (L) 05/19/2023 0100   HGB 10.7 (L) 03/30/2020 1209   HCT 28.8 (L) 05/19/2023 0100   HCT 32.4 (L) 03/30/2020 1209   PLT 226 05/19/2023 0100   PLT 188 03/30/2020 1209   MCV 86.2 05/19/2023 0100   MCV 86 03/30/2020 1209   MCH 26.6 05/19/2023 0100   MCHC 30.9 05/19/2023 0100   RDW 15.7 (H) 05/19/2023 0100   RDW 13.9 03/30/2020 1209   LYMPHSABS 0.8 05/13/2023 1051   MONOABS 0.7 05/13/2023 1051   EOSABS 0.1 05/13/2023 1051   BASOSABS 0.0 05/13/2023 1051    BMET    Component Value Date/Time   NA 138 05/19/2023 0100   NA 142 03/30/2020 1209   K 4.1 05/19/2023 0100   CL 109 05/19/2023 0100   CO2 22 05/19/2023 0100   GLUCOSE 271 (H) 05/19/2023 0100   BUN 13 05/19/2023 0100   BUN 26 03/30/2020 1209   CREATININE 1.25 (H) 05/19/2023 0100   CREATININE 1.44 (H) 05/06/2020 0946   CALCIUM 8.9 05/19/2023 0100   GFRNONAA 48 (L) 05/19/2023 0100   GFRNONAA 39 (L) 05/06/2020 0946   GFRAA 45 (L) 05/06/2020 0946    INR    Component Value Date/Time   INR 1.2 05/13/2023 1051     Intake/Output Summary (Last 24 hours) at 05/19/2023 0728 Last data filed at 05/19/2023 0515 Gross per 24 hour  Intake 840 ml  Output --  Net 840 ml      Assessment/Plan:  66 y.o. female is 1 day post op, s/p: right GT amputation   -She is doing well this morning. She denies any pain in her right foot -Her right foot is dressed and dry this morning. Will take dressing down tomorrow -Okay to ambulate if she is heel weight bearing on  the right foot in a post op/darco shoe  Loel Dubonnet, PA-C [facility] [phone] 05/19/2023 7:28 AM  I have independently examined patient and agree with PA assessment and plan above.   Rip Hawes C. Randie Heinz, MD [facility] [place] Office: [phone] Pager: [phone]

## 2023-05-19 NOTE — Anesthesia Postprocedure Evaluation (Signed)
Anesthesia Post Note  Patient: Quetzal G Swartout  Procedure(s) Performed: RIGHT GREAT TOE AMPUTATION (Right)     Patient location during evaluation: PACU Anesthesia Type: Regional Level of consciousness: awake and alert Pain management: pain level controlled Vital Signs Assessment: post-procedure vital signs reviewed and stable Respiratory status: spontaneous breathing, nonlabored ventilation, respiratory function stable and patient connected to nasal cannula oxygen Cardiovascular status: stable and blood pressure returned to baseline Postop Assessment: no apparent nausea or vomiting Anesthetic complications: no   No notable events documented.  Last Vitals:  Vitals:   05/19/23 0515 05/19/23 0755  BP: (!) 155/91 132/66  Pulse:  85  Resp: 18 18  Temp: 36.5 C 36.7 C  SpO2: 92% 100%    Last Pain:  Vitals:   05/19/23 0755  TempSrc: Oral  PainSc:                  Kennieth Rad

## 2023-05-20 DIAGNOSIS — M869 Osteomyelitis, unspecified: Secondary | ICD-10-CM | POA: Diagnosis not present

## 2023-05-20 LAB — BASIC METABOLIC PANEL
Anion gap: 7 (ref 5–15)
BUN: 13 mg/dL (ref 8–23)
CO2: 21 mmol/L — ABNORMAL LOW (ref 22–32)
Calcium: 8.9 mg/dL (ref 8.9–10.3)
Chloride: 106 mmol/L (ref 98–111)
Creatinine, Ser: 1.2 mg/dL — ABNORMAL HIGH (ref 0.44–1.00)
GFR, Estimated: 50 mL/min — ABNORMAL LOW (ref >60)
Glucose, Bld: 202 mg/dL — ABNORMAL HIGH (ref 70–99)
Potassium: 3.8 mmol/L (ref 3.5–5.1)
Sodium: 134 mmol/L — ABNORMAL LOW (ref 135–145)

## 2023-05-20 LAB — CBC
HCT: 29.3 % — ABNORMAL LOW (ref 36.0–46.0)
Hemoglobin: 9.1 g/dL — ABNORMAL LOW (ref 12.0–15.0)
MCH: 27.2 pg (ref 26.0–34.0)
MCHC: 31.1 g/dL (ref 30.0–36.0)
MCV: 87.7 fL (ref 80.0–100.0)
Platelets: 236 10*3/uL (ref 150–400)
RBC: 3.34 MIL/uL — ABNORMAL LOW (ref 3.87–5.11)
RDW: 15.9 % — ABNORMAL HIGH (ref 11.5–15.5)
WBC: 9.3 10*3/uL (ref 4.0–10.5)
nRBC: 0 % (ref 0.0–0.2)

## 2023-05-20 LAB — GLUCOSE, CAPILLARY
Glucose-Capillary: 142 mg/dL — ABNORMAL HIGH (ref 70–99)
Glucose-Capillary: 178 mg/dL — ABNORMAL HIGH (ref 70–99)
Glucose-Capillary: 206 mg/dL — ABNORMAL HIGH (ref 70–99)
Glucose-Capillary: 182 mg/dL — ABNORMAL HIGH (ref 70–99)

## 2023-05-20 LAB — MAGNESIUM: Magnesium: 1.9 mg/dL (ref 1.7–2.4)

## 2023-05-20 NOTE — Progress Notes (Addendum)
  Progress Note    05/20/2023 7:34 AM 2 Days Post-Op  Subjective:  no complaints    Vitals:   05/19/23 2215 05/20/23 0355  BP: (!) 149/84 124/72  Pulse: 82 77  Resp: 18 16  Temp: 97.7 F (36.5 C) 98 F (36.7 C)  SpO2: 96% 96%    Physical Exam: General:  NAD Cardiac:  regular Incisions:  right GT amputation site intact and dry with healthy tissue Extremities:  right PT doppler signal    CBC    Component Value Date/Time   WBC 9.3 05/20/2023 0123   RBC 3.34 (L) 05/20/2023 0123   HGB 9.1 (L) 05/20/2023 0123   HGB 10.7 (L) 03/30/2020 1209   HCT 29.3 (L) 05/20/2023 0123   HCT 32.4 (L) 03/30/2020 1209   PLT 236 05/20/2023 0123   PLT 188 03/30/2020 1209   MCV 87.7 05/20/2023 0123   MCV 86 03/30/2020 1209   MCH 27.2 05/20/2023 0123   MCHC 31.1 05/20/2023 0123   RDW 15.9 (H) 05/20/2023 0123   RDW 13.9 03/30/2020 1209   LYMPHSABS 0.8 05/13/2023 1051   MONOABS 0.7 05/13/2023 1051   EOSABS 0.1 05/13/2023 1051   BASOSABS 0.0 05/13/2023 1051    BMET    Component Value Date/Time   NA 134 (L) 05/20/2023 0123   NA 142 03/30/2020 1209   K 3.8 05/20/2023 0123   CL 106 05/20/2023 0123   CO2 21 (L) 05/20/2023 0123   GLUCOSE 202 (H) 05/20/2023 0123   BUN 13 05/20/2023 0123   BUN 26 03/30/2020 1209   CREATININE 1.20 (H) 05/20/2023 0123   CREATININE 1.44 (H) 05/06/2020 0946   CALCIUM 8.9 05/20/2023 0123   GFRNONAA 50 (L) 05/20/2023 0123   GFRNONAA 39 (L) 05/06/2020 0946   GFRAA 45 (L) 05/06/2020 0946    INR    Component Value Date/Time   INR 1.2 05/13/2023 1051     Intake/Output Summary (Last 24 hours) at 05/20/2023 0734 Last data filed at 05/19/2023 2215 Gross per 24 hour  Intake 120 ml  Output --  Net 120 ml      Assessment/Plan:  67 y.o. female is 2 days post op, s/p: right great toe amputation   -Right great toe amputation site with healthy tissue today and sutures are intact. Minimal bleeding on dressing -Brisk right PT doppler signal -At discharge  will need to clean the foot daily with soap and water and continue dry dressing changes -Can continue to mobilize with Darco shoe -Okay for discharge from vascular standpoint. She can follow up with our office in 2-3 wks for incision check  Loel Dubonnet, PA-C [facility] [phone] 05/20/2023 7:34 AM   I have independently interviewed and examined patient and agree with PA assessment and plan above.   Nike Southwell C. Randie Heinz, MD [facility] [place] Office: [phone] Pager: [phone]

## 2023-05-20 NOTE — Progress Notes (Signed)
PROGRESS NOTE    Danielle Schroeder  ZOX:[id number] DOB: May 02, 1956 DOA: 05/12/2023 PCP: [name], MD   Brief Narrative:  67 year old female with medical history significant for type II DM/IDDM, PAD with balloon angioplasty and left superficial femoral artery stenting 04/11/2023 by Dr. Nolon Bussing, HTN, HLD, hypothyroid, chronic diastolic CHF, initially presented to the [facility] on 05/12/2023 with complaints of right first toe swelling, redness and pain which started off as an ulcer approximately a month ago and has progressively worsened. Admitted for right diabetic foot infection (first toe cellulitis/abscess and osteomyelitis) complicating underlying poorly controlled type II DM with likely peripheral neuropathy, peripheral artery disease. EDP discussed with orthopedics who recommended vascular surgery consultation given underlying PAD. Discussed with Dr. Myra Gianotti, VVS on-call on 6/30 - Recommending transfer to [facility] for angiogram and Right great toe amputation on   Assessment & Plan:   Principal Problem:   Osteomyelitis of right lower extremity (HCC) Active Problems:   Acquired hypothyroidism   DM2 (diabetes mellitus, type 2) (HCC)   CKD stage 3b, GFR 30-44 ml/min (HCC)   Essential hypertension   HLD (hyperlipidemia)   Sepsis (HCC)   Chronic diastolic CHF (congestive heart failure) (HCC)   History of anemia due to chronic kidney disease  Diabetic foot infection (cellulitis/abscess and osteomyelitis of right first toe) History of peripheral vascular disease status post balloon angioplasty and stenting of left superficial femoral artery 5/29 -Complicated by DM2, peripheral neuropathy and PAD.  X-ray showing concerns of right great toe osteo. -Vascular surgery following, status post angiogram with successful reperfusion.  -Follow up in 2-3 weeks with vascular sx outpatient for incision check.  -Patient tolerated right great toe amputation with clean margins -Okay to  ambulate if she is heel weight bearing on the right foot in a post op/darco shoe per vascular -Continue on aspirin, statin, discontinue antibiotics given clean margins    Chronic stage IIIb CKD Creatinine is stable around 1.4 -peak around 1.6 status post fluid bolus Consider resuming diuretics in the next 24 to 48 hours  Uncontrolled type II DM with peripheral neuropathy: Hyperglycemia -A1c 12.2.  Sliding scale and Accu-Chek ongoing with hypoglycemic protocol -Semglee 46 units, Premeal NovoLog 6 units -follow for hypoglycemia/hypoglycemia   Essential hypertension: - Losartan on hold given above -Continue amlodipine, procardia, Coreg  Hyperlipidemia: -Daily Zocor   Hypothyroidism: - Continue levothyroxine 88 mcg daily.   Chronic diastolic CHF: EF 81% Echo in March 2021 showed EF of 65%.  Repeated, showed EF of 65%, grade 1 DD - Continue aspirin, statin   Normocytic anemia, chronic likely of chronic disease(CKD): Hemoglobin near baseline of 9.0   Pseudohyponatremia -Secondary to hyperglycemia.  Estimated body mass index is 35.18 kg/m as calculated from the following:   Height as of this encounter: 5\' 7"  (1.702 m).   Weight as of this encounter: 101.9 kg.   DVT prophylaxis: heparin injection 5,000 Units Start: 05/16/23 2200 SCDs Start: 05/12/23 2243 Code Status:   Code Status: Full Code Family Communication: None present  Status is: Inpatient  Dispo: The patient is from: Home              Anticipated d/c is to: To be determined              Anticipated d/c date is: 48 to 72 hours              Patient currently not medically stable for discharge  Consultants:  Vascular surgery  Procedures:  Angiogram, amputation per  surgical schedule  Antimicrobials:  Vancomycin, cefepime  Subjective: No acute issues or events overnight, pain currently well-controlled denies nausea vomiting diarrhea constipation headache fevers chills or chest pain  Objective: Vitals:    05/19/23 1935 05/19/23 2215 05/20/23 0355 05/20/23 0500  BP: 127/83 (!) 149/84 124/72   Pulse: 78 82 77   Resp: 18 18 16    Temp: 97.7 F (36.5 C) 97.7 F (36.5 C) 98 F (36.7 C)   TempSrc: Oral Oral Oral   SpO2: 97% 96% 96%   Weight:    101.9 kg  Height:        Intake/Output Summary (Last 24 hours) at 05/20/2023 0729 Last data filed at 05/19/2023 2215 Gross per 24 hour  Intake 120 ml  Output --  Net 120 ml    Filed Weights   05/18/23 1055 05/19/23 0515 05/20/23 0500  Weight: 102.5 kg 102 kg 101.9 kg    Examination:  General:  Pleasantly resting in bed, No acute distress. HEENT:  Normocephalic atraumatic.  Sclerae nonicteric, noninjected.  Extraocular movements intact bilaterally. Neck:  Without mass or deformity.  Trachea is midline. Lungs:  Clear to auscultate bilaterally without rhonchi, wheeze, or rales. Heart:  Regular rate and rhythm.  Without murmurs, rubs, or gallops. Abdomen:  Soft, nontender, nondistended.  Without guarding or rebound. Skin: Right foot bandage clean dry intact  Data Reviewed: I have personally reviewed following labs and imaging studies  CBC: Recent Labs  Lab 05/13/23 1051 05/14/23 0127 05/16/23 0044 05/17/23 0040 05/18/23 0123 05/19/23 0100 05/20/23 0123  WBC 9.8   < > 8.0 7.4 7.9 9.3 9.3  NEUTROABS 8.3*  --   --   --   --   --   --   HGB 8.8*   < > 8.7* 8.6* 8.8* 8.9* 9.1*  HCT 28.2*   < > 28.2* 28.0* 28.5* 28.8* 29.3*  MCV 86.2   < > 87.9 88.3 87.7 86.2 87.7  PLT 200   < > 213 198 211 226 236   < > = values in this interval not displayed.    Basic Metabolic Panel: Recent Labs  Lab 05/16/23 0044 05/17/23 0040 05/18/23 0123 05/19/23 0100 05/20/23 0123  NA 134* 137 138 138 134*  K 3.6 3.7 3.6 4.1 3.8  CL 103 109 109 109 106  CO2 23 20* 23 22 21*  GLUCOSE 299* 298* 202* 271* 202*  BUN 19 17 15 13 13   CREATININE 1.63* 1.40* 1.37* 1.25* 1.20*  CALCIUM 8.5* 8.6* 8.8* 8.9 8.9  MG 1.8 1.8 1.9 1.9 1.9    GFR: Estimated  Creatinine Clearance: 57.3 mL/min (A) (by C-G formula based on SCr of 1.2 mg/dL (H)). Liver Function Tests: Recent Labs  Lab 05/13/23 1051  AST 13*  ALT 11  ALKPHOS 107  BILITOT 0.9  PROT 6.4*  ALBUMIN 2.4*    Coagulation Profile: Recent Labs  Lab 05/13/23 1051  INR 1.2    CBG: Recent Labs  Lab 05/19/23 0639 05/19/23 1111 05/19/23 1540 05/19/23 2147 05/20/23 0619  GLUCAP 234* 237* 240* 223* 142*    Lipid Profile: No results for input(s): "CHOL", "HDL", "LDLCALC", "TRIG", "CHOLHDL", "LDLDIRECT" in the last 72 hours.  Anemia Panel: No results for input(s): "VITAMINB12", "FOLATE", "FERRITIN", "TIBC", "IRON", "RETICCTPCT" in the last 72 hours.  Sepsis Labs: No results for input(s): "PROCALCITON", "LATICACIDVEN" in the last 168 hours.   Recent Results (from the past 240 hour(s))  Blood culture (routine x 2)     Status: None  Collection Time: 05/12/23  8:21 PM   Specimen: BLOOD  Result Value Ref Range Status   Specimen Description   Final    BLOOD LEFT ANTECUBITAL Performed at [facility], [address], [place], [place] [address]    Special Requests   Final    BOTTLES DRAWN AEROBIC AND ANAEROBIC Blood Culture adequate volume Performed at [facility], [address], [place], [place] [address]    Culture   Final    NO GROWTH 5 DAYS Performed at [facility], [address] [place], [place] [address]    Report Status 05/17/2023 FINAL  Final  Blood culture (routine x 2)     Status: Abnormal   Collection Time: 05/12/23  8:42 PM   Specimen: BLOOD  Result Value Ref Range Status   Specimen Description   Final    BLOOD RIGHT ANTECUBITAL Performed at [facility], [address] [place], [place] [address]    Special Requests   Final    BOTTLES DRAWN AEROBIC AND ANAEROBIC Blood Culture adequate volume Performed at [facility], [address], [place], [place] [address]     Culture  Setup Time   Final    GRAM POSITIVE COCCI IN CHAINS AEROBIC BOTTLE ONLY CRITICAL RESULT CALLED TO, READ BACK BY AND VERIFIED WITH: PHARMD J. LEDFORD 05/14/23 @ 0425 BY AB Performed at [facility], [address] [place], [place] [address]    Culture (A)  Final    STREPTOCOCCUS CONSTELLATUS STREPTOCOCCI, BETA HEMOLYTIC    Report Status 05/16/2023 FINAL  Final   Organism ID, Bacteria STREPTOCOCCUS CONSTELLATUS  Final      Susceptibility   Streptococcus constellatus - MIC*    PENICILLIN <=0.06 SENSITIVE Sensitive     CEFTRIAXONE <=0.12 SENSITIVE Sensitive     ERYTHROMYCIN <=0.12 SENSITIVE Sensitive     LEVOFLOXACIN 0.5 SENSITIVE Sensitive     VANCOMYCIN 0.5 SENSITIVE Sensitive     * STREPTOCOCCUS CONSTELLATUS  Blood Culture ID Panel (Reflexed)     Status: Abnormal   Collection Time: 05/12/23  8:42 PM  Result Value Ref Range Status   Enterococcus faecalis NOT DETECTED NOT DETECTED Final   Enterococcus Faecium NOT DETECTED NOT DETECTED Final   Listeria monocytogenes NOT DETECTED NOT DETECTED Final   Staphylococcus species NOT DETECTED NOT DETECTED Final   Staphylococcus aureus (BCID) NOT DETECTED NOT DETECTED Final   Staphylococcus epidermidis NOT DETECTED NOT DETECTED Final   Staphylococcus lugdunensis NOT DETECTED NOT DETECTED Final   Streptococcus species DETECTED (A) NOT DETECTED Final    Comment: Not Enterococcus species, Streptococcus agalactiae, Streptococcus pyogenes, or Streptococcus pneumoniae. CRITICAL RESULT CALLED TO, READ BACK BY AND VERIFIED WITH: PHARMD J. LEDFORD 05/14/23 @ 0425 BY AB    Streptococcus agalactiae NOT DETECTED NOT DETECTED Final   Streptococcus pneumoniae NOT DETECTED NOT DETECTED Final   Streptococcus pyogenes NOT DETECTED NOT DETECTED Final   A.calcoaceticus-baumannii NOT DETECTED NOT DETECTED Final   Bacteroides fragilis NOT DETECTED NOT DETECTED Final   Enterobacterales NOT DETECTED NOT DETECTED Final   Enterobacter cloacae complex  NOT DETECTED NOT DETECTED Final   Escherichia coli NOT DETECTED NOT DETECTED Final   Klebsiella aerogenes NOT DETECTED NOT DETECTED Final   Klebsiella oxytoca NOT DETECTED NOT DETECTED Final   Klebsiella pneumoniae NOT DETECTED NOT DETECTED Final   Proteus species NOT DETECTED NOT DETECTED Final   Salmonella species NOT DETECTED NOT DETECTED Final   Serratia marcescens NOT DETECTED NOT  DETECTED Final   Haemophilus influenzae NOT DETECTED NOT DETECTED Final   Neisseria meningitidis NOT DETECTED NOT DETECTED Final   Pseudomonas aeruginosa NOT DETECTED NOT DETECTED Final   Stenotrophomonas maltophilia NOT DETECTED NOT DETECTED Final   Candida albicans NOT DETECTED NOT DETECTED Final   Candida auris NOT DETECTED NOT DETECTED Final   Candida glabrata NOT DETECTED NOT DETECTED Final   Candida krusei NOT DETECTED NOT DETECTED Final   Candida parapsilosis NOT DETECTED NOT DETECTED Final   Candida tropicalis NOT DETECTED NOT DETECTED Final   Cryptococcus neoformans/gattii NOT DETECTED NOT DETECTED Final    Comment: Performed at [facility], [address], [place], [place] [address]  Wet prep, genital     Status: None   Collection Time: 05/12/23  9:47 PM  Result Value Ref Range Status   Yeast Wet Prep HPF POC NONE SEEN NONE SEEN Final   Trich, Wet Prep NONE SEEN NONE SEEN Final   Clue Cells Wet Prep HPF POC NONE SEEN NONE SEEN Final   WBC, Wet Prep HPF POC <10 <10 Final   Sperm NONE SEEN  Final    Comment: Performed at [facility], [address] [place], [place] [address]  Surgical pcr screen     Status: Abnormal   Collection Time: 05/18/23  6:23 AM   Specimen: Nasal Mucosa; Nasal Swab  Result Value Ref Range Status   MRSA, PCR NEGATIVE NEGATIVE Final   Staphylococcus aureus POSITIVE (A) NEGATIVE Final    Comment: (NOTE) The Xpert SA Assay (FDA approved for NASAL specimens in patients 66 years of age and older), is one component of a  comprehensive surveillance program. It is not intended to diagnose infection nor to guide or monitor treatment. Performed at [facility], [address] [place], [place] [address]          Radiology Studies: No results found.   Scheduled Meds:  amLODipine  5 mg Oral Daily   aspirin EC  81 mg Oral Daily   carvedilol  25 mg Oral BID WC   Chlorhexidine Gluconate Cloth  6 each Topical Q0600   heparin  5,000 Units Subcutaneous Q8H   insulin aspart  0-15 Units Subcutaneous TID WC   insulin aspart  0-5 Units Subcutaneous QHS   insulin aspart  6 Units Subcutaneous TID WC   insulin glargine-yfgn  46 Units Subcutaneous Daily   levothyroxine  88 mcg Oral Daily   mupirocin ointment  1 Application Nasal BID   potassium chloride  10 mEq Oral Daily   simvastatin  20 mg Oral QPM   sodium chloride flush  3 mL Intravenous Q12H   Continuous Infusions:  sodium chloride       LOS: 8 days   Time spent:  Azucena Fallen, DO [facility]  If 7PM-7AM, please contact night-coverage [email or web address]  05/20/2023, 7:29 AM

## 2023-05-20 NOTE — TOC Progression Note (Signed)
Transition of Care [facility]) - Progression Note    Patient Details  Name: Danielle Schroeder MRN: [id number] Date of Birth: February 02, 1956  [facility] CM/SW Contact  Ronny Bacon, RN Phone Number: 05/20/2023, 2:40 PM  Clinical Narrative:  Patient with PT recommendations for [facility] PT and RW. HH PT arranged through [facility] and RW arranged through [facility]. Need HH orders placed.     Expected Discharge Plan:  (To be determined) Barriers to Discharge: Continued Medical Work up  Expected Discharge Plan and Services                                               Social Determinants of Health (SDOH) Interventions SDOH Screenings   Food Insecurity: No Food Insecurity (05/13/2023)  Housing: Low Risk  (05/13/2023)  Transportation Needs: No Transportation Needs (05/13/2023)  Utilities: Not At Risk (05/13/2023)  Depression (PHQ2-9): Low Risk  (04/20/2023)  Recent Concern: Depression (PHQ2-9) - High Risk (04/16/2023)  Financial Resource Strain: Low Risk  (04/20/2023)  Physical Activity: Inactive (04/20/2023)  Stress: No Stress Concern Present (04/20/2023)  Tobacco Use: Low Risk  (05/19/2023)    Readmission Risk Interventions     No data to display

## 2023-05-21 ENCOUNTER — Other Ambulatory Visit (HOSPITAL_COMMUNITY): Payer: Self-pay

## 2023-05-21 DIAGNOSIS — M869 Osteomyelitis, unspecified: Secondary | ICD-10-CM | POA: Diagnosis not present

## 2023-05-21 LAB — GLUCOSE, CAPILLARY
Glucose-Capillary: 181 mg/dL — ABNORMAL HIGH (ref 70–99)
Glucose-Capillary: 198 mg/dL — ABNORMAL HIGH (ref 70–99)

## 2023-05-21 LAB — CBC
HCT: 30.9 % — ABNORMAL LOW (ref 36.0–46.0)
Hemoglobin: 9.7 g/dL — ABNORMAL LOW (ref 12.0–15.0)
MCH: 27.3 pg (ref 26.0–34.0)
MCHC: 31.4 g/dL (ref 30.0–36.0)
MCV: 87 fL (ref 80.0–100.0)
Platelets: 277 10*3/uL (ref 150–400)
RBC: 3.55 MIL/uL — ABNORMAL LOW (ref 3.87–5.11)
RDW: 16.2 % — ABNORMAL HIGH (ref 11.5–15.5)
WBC: 8.3 10*3/uL (ref 4.0–10.5)
nRBC: 0 % (ref 0.0–0.2)

## 2023-05-21 LAB — BASIC METABOLIC PANEL
Anion gap: 11 (ref 5–15)
BUN: 13 mg/dL (ref 8–23)
CO2: 23 mmol/L (ref 22–32)
Calcium: 9.5 mg/dL (ref 8.9–10.3)
Chloride: 105 mmol/L (ref 98–111)
Creatinine, Ser: 1.12 mg/dL — ABNORMAL HIGH (ref 0.44–1.00)
GFR, Estimated: 55 mL/min — ABNORMAL LOW (ref >60)
Glucose, Bld: 193 mg/dL — ABNORMAL HIGH (ref 70–99)
Potassium: 4.2 mmol/L (ref 3.5–5.1)
Sodium: 139 mmol/L (ref 135–145)

## 2023-05-21 LAB — MAGNESIUM: Magnesium: 2.1 mg/dL (ref 1.7–2.4)

## 2023-05-21 MED ORDER — AMLODIPINE BESYLATE 5 MG PO TABS
5.0000 mg | ORAL_TABLET | Freq: Every day | ORAL | 0 refills | Status: DC
  Filled 2023-05-21: qty 30, 30d supply, fill #0

## 2023-05-21 MED ORDER — ACETAMINOPHEN 325 MG PO TABS
650.0000 mg | ORAL_TABLET | ORAL | 0 refills | Status: DC | PRN
  Filled 2023-05-21: qty 30, 3d supply, fill #0

## 2023-05-21 NOTE — Care Management Important Message (Signed)
Important Message  Patient Details  Name: Danielle Schroeder MRN: [id number] Date of Birth: October 31, 1956   Medicare Important Message Given:  Yes     Renie Ora 05/21/2023, 11:39 AM

## 2023-05-21 NOTE — Discharge Summary (Signed)
Physician Discharge Summary  Danielle Schroeder ZOX:[id number] DOB: Mar 07, 1956 DOA: 05/12/2023  PCP: [name], MD  Admit date: 05/12/2023 Discharge date: 05/21/2023  Admitted From: Home Disposition: Home  Recommendations for Outpatient Follow-up:  Follow up with PCP in 1-2 weeks Follow-up with vascular surgery in 2 to 3 weeks for incision check Continue to wear proper postop shoe until otherwise instructed by surgery:  Home Health: PT OT Equipment/Devices: None  Discharge Condition: Stable CODE STATUS: Full Diet recommendation: Low-salt low-fat low-carb diet  Brief/Interim Summary: 67 year old female with medical history significant for type II DM/IDDM, PAD with balloon angioplasty and left superficial femoral artery stenting 04/11/2023 by Dr. Nolon Bussing, HTN, HLD, hypothyroid, chronic diastolic CHF, initially presented to the [facility] on 05/12/2023 with complaints of right first toe swelling -after having complaints of redness and pain for 1 month patient reported to the hospital, imaging concerning for osteomyelitis.  Ultimately transitioned to [facility] for vascular evaluation.  Patient mated as above with likely subacute to acute diabetic foot infection of the right first toe.  Patient is a known vasculopath having recently had left lower extremity revascularization.  Patient underwent revascularization on 7/3 ultimately having amputation of right first toe on 7/5.  Patient tolerated quite well no postop pain to complain of, patient is well-controlled on NSAIDs.  Ambulating well with postsurgical shoe and otherwise stable and agreeable for discharge home.  Patient's comorbid conditions including CKD 3B and hypertension were well-controlled, A1c at 12.2 indicates very uncontrolled glucose.  Recommended strict diabetic diet and close follow-up with PCP for medication titration.  Patient was well-controlled here on similar regimen at home indicating likely her diet  is the cause of her uncontrolled diabetes.  At this time she otherwise stable and agreeable for discharge, follow-up with PCP in 1 to 2 weeks, vascular surgery in 2 to 3 weeks.  Discharge Diagnoses:  Principal Problem:   Osteomyelitis of right lower extremity (HCC) Active Problems:   Acquired hypothyroidism   DM2 (diabetes mellitus, type 2) (HCC)   CKD stage 3b, GFR 30-44 ml/min (HCC)   Essential hypertension   HLD (hyperlipidemia)   Sepsis (HCC)   Chronic diastolic CHF (congestive heart failure) (HCC)   History of anemia due to chronic kidney disease   Diabetic foot infection (cellulitis/abscess and osteomyelitis of right first toe) History of peripheral vascular disease status post balloon angioplasty and stenting of left superficial femoral artery 5/29 -Complicated by DM2, peripheral neuropathy and PAD.  X-ray showing concerns of right great toe osteo. -Vascular surgery following, status post RLE angiogram with successful reperfusion 7/3.  -Follow up in 2-3 weeks with vascular sx outpatient for incision check.  -Patient tolerated right great toe amputation with clean margins -Okay to ambulate if she is heel weight bearing on the right foot in a post op/darco shoe per vascular -Continue on aspirin, statin, discontinue antibiotics given clean margins noted intraoperatively    Chronic stage IIIb CKD Creatinine is stable around 1.4 -peak around 1.6 status post fluid bolus Continue furosemide   Uncontrolled type II DM with peripheral neuropathy: Hyperglycemia -A1c 12.2. -Resume home medications, patient moderately well-controlled here on similar regimen indicating diet is likely to blame for her uncontrolled diabetes.   Essential hypertension: - Losartan discontinued given above with well-controlled pressures currently -Continue amlodipine, procardia, Coreg   Hyperlipidemia: -Daily statin   Hypothyroidism: - Continue levothyroxine 88 mcg daily.   Chronic diastolic CHF: EF  65% Echo in March 2021 showed EF of 65%.  Repeated, showed EF of 65%, grade 1 DD - Continue aspirin, statin, blood pressure and diabetes control as above   Normocytic anemia, chronic likely of chronic disease(CKD): Hemoglobin near baseline of 9.0   Pseudohyponatremia -Secondary to hyperglycemia.   Estimated body mass index is 35.18 kg/m as calculated from the following:   Height as of this encounter: 5\' 7"  (1.702 m).   Weight as of this encounter: 101.9 kg.  Discharge Instructions   Allergies as of 05/21/2023   No Known Allergies      Medication List     STOP taking these medications    clopidogrel 75 MG tablet Commonly known as: Plavix   losartan 100 MG tablet Commonly known as: COZAAR   NIFEdipine 30 MG 24 hr tablet Commonly known as: PROCARDIA-XL/NIFEDICAL-XL       TAKE these medications    acetaminophen 325 MG tablet Commonly known as: TYLENOL Take 2 tablets (650 mg total) by mouth every 4 (four) hours as needed for headache or mild pain.   amLODipine 5 MG tablet Commonly known as: NORVASC Take 1 tablet (5 mg total) by mouth daily. Start taking on: May 22, 2023   aspirin EC 81 MG tablet Take 81 mg by mouth in the morning.   Basaglar KwikPen 100 UNIT/ML Inject 40 Units into the skin daily.   cabergoline 0.5 MG tablet Commonly known as: DOSTINEX Take 1 tablet (0.5 mg total) by mouth 2 (two) times a week.   carvedilol 25 MG tablet Commonly known as: COREG Take 25 mg by mouth 2 (two) times daily with a meal.   furosemide 40 MG tablet Commonly known as: LASIX TAKE 1 TABLET BY MOUTH TWICE A DAY   glucose blood test strip 1 each by Other route 3 (three) times daily. Use as instructed   hydrocortisone 2.5 % ointment Apply topically 2 (two) times daily. As needed for mild eczema.  Do not use for more than 1-2 weeks at a time. What changed:  how much to take when to take this additional instructions   levothyroxine 88 MCG tablet Commonly known  as: SYNTHROID Take 1 tablet (88 mcg total) by mouth daily.   NovoLOG FlexPen 100 UNIT/ML FlexPen Generic drug: insulin aspart Max daily 45 units What changed:  how much to take how to take this when to take this   Pen Needles 31G X 5 MM Misc 1 each by Does not apply route in the morning, at noon, in the evening, and at bedtime.   potassium chloride 10 MEQ tablet Commonly known as: KLOR-CON Take 1 tablet (10 mEq total) by mouth daily.   simvastatin 20 MG tablet Commonly known as: ZOCOR Take 20 mg by mouth every evening.   SYRINGE-NEEDLE (DISP) 3 ML 25G X 1" 3 ML Misc Inject 1 ml into the skin 3 times daily.   Vitamin D (Cholecalciferol) 25 MCG (1000 UT) Caps Take 2 capsules by mouth daily. What changed: when to take this               Durable Medical Equipment  (From admission, onward)           Start     Ordered   05/20/23 1431  For home use only DME Walker rolling  Once       Question Answer Comment  Walker: With 5 Inch Wheels   Patient needs a walker to treat with the following condition Weakness      05/20/23 1431  Follow-up Information     [facility] Follow up in 3 week(s).   Specialty: Vascular Surgery Why: Office will call to arrange your appt(s) (sent) Contact information: [address] [place] [address] [phone]        [facility] Follow up.   Specialty: Home Health Services Why: HHPT arranged - they will contact you to schedule Contact information: [address] [place] [address] [phone]         [facility] Follow up.   Why: (Adapt)- Rolling walker arranged Contact information: [address] [place] [address] [phone]                No Known Allergies  Consultations: Vascular surgery  Procedures/Studies: PERIPHERAL VASCULAR CATHETERIZATION  Result Date: 05/16/2023 Images from  the original result were not included.   Patient name: Danielle Schroeder      MRN: [id number]        DOB: 17-Jan-1956          Sex: female  05/12/2023 - 05/16/2023 Pre-operative Diagnosis: Right lower extremity critical ischemia with tissue loss toe Post-operative diagnosis:  Same Surgeon:  Victorino Sparrow, MD Procedure Performed: 1.  Ultrasound-guided access of the left common femoral artery 2.  Second-order cannulation, right lower extremity angiogram 3.  Third cannulation, right lower extremity angiogram from the superficial femoral artery 4.  Third order cannulation, please order angiogram 5.  Balloon angioplasty 3 x 100 posterior tibial artery, peroneal artery 6.  Moderate sedation time 44, contrast volume 50ml 7.  Device assisted closure-Pro-glide   Indications: Patient is a 67 year old female well-known to my practice recently had left lower extremity critical ischemia with tissue loss.  She underwent intervention, and this is since healed.  She presented with new right lower extremity tissue loss at the great toe with depressed ABIs, nonpalpable pulse.  After discussing risk and benefits of right lower extremity angiogram in an effort to define and treat lesions to improve distal perfusion for wound healing, Christin elected to proceed.  Findings: Aortogram: Deferred-see study from last month-no flow-limiting stenosis On the right: Widely patent common iliac artery, external neck artery, hypogastric artery.  Widely patent common femoral artery, profunda, superficial femoral artery, popliteal artery.  Severe tibial disease. Single-vessel inline flow via the peroneal artery. Severe tibial disease with anterior tibial artery occlusion 3 cm from the ostia, peroneal artery with 2, tandem, 80% flow-limiting lesions proximally.  Proximal posterior tibial artery occlusion with reconstitution at the mid posterior tibial artery continuing into the foot via the plantar branches.  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              Procedure:  The patient was identified in the holding area and taken to room 8.  The patient was then placed supine on the table and prepped and draped in the usual sterile fashion.  A time out was called.  Ultrasound was used to evaluate the left common femoral artery.  It was patent .  A digital ultrasound image was acquired.  A micropuncture needle was used to access the left common femoral artery under ultrasound guidance.  An 018 wire was advanced without resistance and a micropuncture sheath was placed.  The 018 wire was removed and a benson wire was placed.  The micropuncture sheath was exchanged for a 5 french sheath.  Angiogram was deferred as 1 was completed last month.  An Omni Flush catheter  was used to traverse the aortic arch.  The Omni Flush catheter was parked in the right common iliac artery and pelvic angiogram followed.  It was then positioned in the right common femoral artery for right lower extremity angiogram to the foot.  See results above.    I elected to attempt intervention on the posterior tibial and peroneal arteries.  A 6 French by 60 cm sheath was brought onto the field and parked in the superficial femoral artery.  Diagnostic angiography followed from the superficial femoral artery to map the popliteal artery, peroneal artery, posterior tibial artery.  The patient was heparinized.  I was able to use a series of wires and catheters to cannulate the posterior tibial artery and cross the occlusion.  The wire was sent below the level of the ankle into a plantar branch.  I followed this with a catheter and angiography to ensure I was true lumen.  I was, angiography from this site showed that the plantar arch was incomplete.  Next, we used a 3 x 100 mm balloon to angioplasty the proximal portion of the posterior tibial artery this was inflated for 3 minutes.  Follow-up angiography demonstrated excellent result with resolution of the occlusion.  The vessel was widely patent.  Next, I  pulled the balloon and wire back and recannulated the peroneal artery.  The same 3 x 100 mm balloon was used to plasty the peroneal artery.  Follow-up angiography demonstrated excellent result with resolution of the tandem, flow-limiting stenoses.   This was an excellent result.  Patient was closed with the use of a ProGlide device.  Impression: Recanalization of the posterior tibial artery.  Resolution of flow-limiting stenosis of the peroneal artery.  Two-vessel runoff to the foot with severe microvascular disease appreciated distally.   Fara Olden, MD [facility] Office: [phone]    ECHOCARDIOGRAM COMPLETE  Result Date: 05/14/2023    ECHOCARDIOGRAM REPORT   Patient Name:   Danielle Schroeder Date of Exam: 05/14/2023 Medical Rec #:  [id number]         Height:       67.0 in Accession #:    [id number]        Weight:       221.6 lb Date of Birth:  1956/09/01         BSA:          2.112 m Patient Age:    66 years          BP:           141/80 mmHg Patient Gender: F                 HR:           86 bpm. Exam Location:  Inpatient Procedure: 2D Echo, Cardiac Doppler and Color Doppler Indications:    CHF-I50.9  History:        Patient has prior history of Echocardiogram examinations, most                 recent 02/03/2020. CHF, PAD; Risk Factors:Hypertension, Sleep                 Apnea, Diabetes and Dyslipidemia. CKD, stage 3.  Sonographer:    Lucendia Herrlich Referring Phys: [id number] ANAND D HONGALGI IMPRESSIONS  1. Left ventricular ejection fraction, by estimation, is 60 to 65%. The left ventricle has normal function. The left ventricle has no regional wall motion abnormalities. There is mild  left ventricular hypertrophy of the basal-septal segment. Left ventricular diastolic parameters are consistent with Grade I diastolic dysfunction (impaired relaxation).  2. Right ventricular systolic function is normal. The right ventricular size is normal. There is normal pulmonary artery systolic  pressure. The estimated right ventricular systolic pressure is 31.8 mmHg.  3. The mitral valve is degenerative. Trivial mitral valve regurgitation. Mild mitral stenosis. The mean mitral valve gradient is 3.6 mmHg with average heart rate of 84 bpm. Moderate mitral annular calcification.  4. The aortic valve is tricuspid. Aortic valve regurgitation is not visualized.  5. The inferior vena cava is normal in size with <50% respiratory variability, suggesting right atrial pressure of 8 mmHg. Comparison(s): Changes from prior study are noted. 02/03/2020: LVEF 55-60%. FINDINGS  Left Ventricle: Left ventricular ejection fraction, by estimation, is 60 to 65%. The left ventricle has normal function. The left ventricle has no regional wall motion abnormalities. The left ventricular internal cavity size was normal in size. There is  mild left ventricular hypertrophy of the basal-septal segment. Left ventricular diastolic parameters are consistent with Grade I diastolic dysfunction (impaired relaxation). Indeterminate filling pressures. Right Ventricle: The right ventricular size is normal. No increase in right ventricular wall thickness. Right ventricular systolic function is normal. There is normal pulmonary artery systolic pressure. The tricuspid regurgitant velocity is 2.44 m/s, and  with an assumed right atrial pressure of 8 mmHg, the estimated right ventricular systolic pressure is 31.8 mmHg. Left Atrium: Left atrial size was normal in size. Right Atrium: Right atrial size was normal in size. Pericardium: There is no evidence of pericardial effusion. Mitral Valve: The mitral valve is degenerative in appearance. There is mild calcification of the anterior and posterior mitral valve leaflet(s). Moderate mitral annular calcification. Trivial mitral valve regurgitation. Mild mitral valve stenosis. The mean mitral valve gradient is 3.6 mmHg with average heart rate of 84 bpm. Tricuspid Valve: The tricuspid valve is grossly  normal. Tricuspid valve regurgitation is trivial. Aortic Valve: The aortic valve is tricuspid. Aortic valve regurgitation is not visualized. Aortic valve peak gradient measures 10.1 mmHg. Pulmonic Valve: The pulmonic valve was normal in structure. Pulmonic valve regurgitation is not visualized. Aorta: The aortic root and ascending aorta are structurally normal, with no evidence of dilitation. Venous: The inferior vena cava is normal in size with less than 50% respiratory variability, suggesting right atrial pressure of 8 mmHg. IAS/Shunts: No atrial level shunt detected by color flow Doppler.  LEFT VENTRICLE PLAX 2D LVIDd:         3.90 cm   Diastology LVIDs:         2.80 cm   LV e' medial:    7.93 cm/s LV PW:         0.90 cm   LV E/e' medial:  14.6 LV IVS:        1.30 cm   LV e' lateral:   10.90 cm/s LVOT diam:     2.20 cm   LV E/e' lateral: 10.6 LV SV:         77 LV SV Index:   37 LVOT Area:     3.80 cm  RIGHT VENTRICLE             IVC RV S prime:     12.40 cm/s  IVC diam: 1.70 cm TAPSE (M-mode): 2.1 cm LEFT ATRIUM             Index        RIGHT ATRIUM  Index LA diam:        3.70 cm 1.75 cm/m   RA Area:     9.31 cm LA Vol (A2C):   53.3 ml 25.21 ml/m  RA Volume:   15.60 ml 7.39 ml/m LA Vol (A4C):   69.2 ml 32.76 ml/m LA Biplane Vol: 71.5 ml 33.85 ml/m  AORTIC VALVE AV Area (Vmax): 2.93 cm AV Vmax:        159.00 cm/s AV Peak Grad:   10.1 mmHg LVOT Vmax:      122.67 cm/s LVOT Vmean:     78.133 cm/s LVOT VTI:       0.203 m  AORTA Ao Root diam: 3.20 cm Ao Asc diam:  3.70 cm MITRAL VALVE                TRICUSPID VALVE MV Area (PHT): 3.65 cm     TR Peak grad:   23.8 mmHg MV Mean grad:  3.6 mmHg     TR Vmax:        244.00 cm/s MV Decel Time: 208 msec MV E velocity: 116.00 cm/s  SHUNTS MV A velocity: 150.00 cm/s  Systemic VTI:  0.20 m MV E/A ratio:  0.77         Systemic Diam: 2.20 cm Zoila Shutter MD Electronically signed by Zoila Shutter MD Signature Date/Time: 05/14/2023/3:51:40 PM    Final    DG Foot  Complete Right  Result Date: 05/12/2023 CLINICAL DATA:  Great toe wound, concern for osteomyelitis EXAM: RIGHT FOOT COMPLETE - 3+ VIEW COMPARISON:  None Available. FINDINGS: No fracture or dislocation of the right foot. Mild arthrosis. Bony erosion of the tuft of the right great toe. Overlying soft tissue wound. Diffuse soft tissue edema about the foot and ankle. IMPRESSION: 1. Bony erosion of the tuft of the right great toe, concerning for osteomyelitis. Overlying soft tissue wound. 2. No fracture or dislocation of the right foot. 3. Diffuse soft tissue edema about the foot and ankle. Electronically Signed   By: Jearld Lesch M.D.   On: 05/12/2023 21:10     Subjective: No acute issues or events overnight, pain currently well-controlled ambulating without difficulty otherwise stable for discharge   Discharge Exam: Vitals:   05/21/23 0454 05/21/23 0855  BP: (!) 161/97 (!) 142/81  Pulse: 79 79  Resp: 19 20  Temp: 98.1 F (36.7 C) 97.6 F (36.4 C)  SpO2: 94% 94%   Vitals:   05/21/23 0014 05/21/23 0454 05/21/23 0559 05/21/23 0855  BP: (!) 154/89 (!) 161/97  (!) 142/81  Pulse: 77 79  79  Resp: 19 19  20   Temp: 97.6 F (36.4 C) 98.1 F (36.7 C)  97.6 F (36.4 C)  TempSrc: Oral Oral  Oral  SpO2: 97% 94%  94%  Weight:   102.3 kg   Height:        General: Pt is alert, awake, not in acute distress Cardiovascular: RRR, S1/S2 +, no rubs, no gallops Respiratory: CTA bilaterally, no wheezing, no rhonchi Abdominal: Soft, NT, ND, bowel sounds + Extremities: no edema, no cyanosis    The results of significant diagnostics from this hospitalization (including imaging, microbiology, ancillary and laboratory) are listed below for reference.     Microbiology: Recent Results (from the past 240 hour(s))  Blood culture (routine x 2)     Status: None   Collection Time: 05/12/23  8:21 PM   Specimen: BLOOD  Result Value Ref Range Status   Specimen Description   Final  BLOOD LEFT  ANTECUBITAL Performed at [facility], [address], [place], [place] [address]    Special Requests   Final    BOTTLES DRAWN AEROBIC AND ANAEROBIC Blood Culture adequate volume Performed at [facility], [address] [place], [place] [address]    Culture   Final    NO GROWTH 5 DAYS Performed at [facility], [address], [place], [place] [address]    Report Status 05/17/2023 FINAL  Final  Blood culture (routine x 2)     Status: Abnormal   Collection Time: 05/12/23  8:42 PM   Specimen: BLOOD  Result Value Ref Range Status   Specimen Description   Final    BLOOD RIGHT ANTECUBITAL Performed at [facility], [address] [place], [place] [address]    Special Requests   Final    BOTTLES DRAWN AEROBIC AND ANAEROBIC Blood Culture adequate volume Performed at [facility], [address], [place], [place] [address]    Culture  Setup Time   Final    GRAM POSITIVE COCCI IN CHAINS AEROBIC BOTTLE ONLY CRITICAL RESULT CALLED TO, READ BACK BY AND VERIFIED WITH: PHARMD J. LEDFORD 05/14/23 @ 0425 BY AB Performed at [facility], [address] [place], [place] [address]    Culture (A)  Final    STREPTOCOCCUS CONSTELLATUS STREPTOCOCCI, BETA HEMOLYTIC    Report Status 05/16/2023 FINAL  Final   Organism ID, Bacteria STREPTOCOCCUS CONSTELLATUS  Final      Susceptibility   Streptococcus constellatus - MIC*    PENICILLIN <=0.06 SENSITIVE Sensitive     CEFTRIAXONE <=0.12 SENSITIVE Sensitive     ERYTHROMYCIN <=0.12 SENSITIVE Sensitive     LEVOFLOXACIN 0.5 SENSITIVE Sensitive     VANCOMYCIN 0.5 SENSITIVE Sensitive     * STREPTOCOCCUS CONSTELLATUS  Blood Culture ID Panel (Reflexed)     Status: Abnormal   Collection Time: 05/12/23  8:42 PM  Result Value Ref Range Status   Enterococcus faecalis NOT DETECTED NOT DETECTED Final   Enterococcus Faecium NOT DETECTED NOT DETECTED Final   Listeria  monocytogenes NOT DETECTED NOT DETECTED Final   Staphylococcus species NOT DETECTED NOT DETECTED Final   Staphylococcus aureus (BCID) NOT DETECTED NOT DETECTED Final   Staphylococcus epidermidis NOT DETECTED NOT DETECTED Final   Staphylococcus lugdunensis NOT DETECTED NOT DETECTED Final   Streptococcus species DETECTED (A) NOT DETECTED Final    Comment: Not Enterococcus species, Streptococcus agalactiae, Streptococcus pyogenes, or Streptococcus pneumoniae. CRITICAL RESULT CALLED TO, READ BACK BY AND VERIFIED WITH: PHARMD J. LEDFORD 05/14/23 @ 0425 BY AB    Streptococcus agalactiae NOT DETECTED NOT DETECTED Final   Streptococcus pneumoniae NOT DETECTED NOT DETECTED Final   Streptococcus pyogenes NOT DETECTED NOT DETECTED Final   A.calcoaceticus-baumannii NOT DETECTED NOT DETECTED Final   Bacteroides fragilis NOT DETECTED NOT DETECTED Final   Enterobacterales NOT DETECTED NOT DETECTED Final   Enterobacter cloacae complex NOT DETECTED NOT DETECTED Final   Escherichia coli NOT DETECTED NOT DETECTED Final   Klebsiella aerogenes NOT DETECTED NOT DETECTED Final   Klebsiella oxytoca NOT DETECTED NOT DETECTED Final   Klebsiella pneumoniae NOT DETECTED NOT DETECTED Final   Proteus species NOT DETECTED NOT DETECTED Final   Salmonella species NOT DETECTED NOT DETECTED Final   Serratia marcescens NOT DETECTED NOT DETECTED Final   Haemophilus influenzae NOT DETECTED NOT DETECTED Final   Neisseria meningitidis NOT DETECTED NOT DETECTED Final   Pseudomonas aeruginosa NOT DETECTED  NOT DETECTED Final   Stenotrophomonas maltophilia NOT DETECTED NOT DETECTED Final   Candida albicans NOT DETECTED NOT DETECTED Final   Candida auris NOT DETECTED NOT DETECTED Final   Candida glabrata NOT DETECTED NOT DETECTED Final   Candida krusei NOT DETECTED NOT DETECTED Final   Candida parapsilosis NOT DETECTED NOT DETECTED Final   Candida tropicalis NOT DETECTED NOT DETECTED Final   Cryptococcus neoformans/gattii NOT  DETECTED NOT DETECTED Final    Comment: Performed at [facility], [address], [place], [place] [address]  Wet prep, genital     Status: None   Collection Time: 05/12/23  9:47 PM  Result Value Ref Range Status   Yeast Wet Prep HPF POC NONE SEEN NONE SEEN Final   Trich, Wet Prep NONE SEEN NONE SEEN Final   Clue Cells Wet Prep HPF POC NONE SEEN NONE SEEN Final   WBC, Wet Prep HPF POC <10 <10 Final   Sperm NONE SEEN  Final    Comment: Performed at [facility], [address], [place], [place] [address]  Surgical pcr screen     Status: Abnormal   Collection Time: 05/18/23  6:23 AM   Specimen: Nasal Mucosa; Nasal Swab  Result Value Ref Range Status   MRSA, PCR NEGATIVE NEGATIVE Final   Staphylococcus aureus POSITIVE (A) NEGATIVE Final    Comment: (NOTE) The Xpert SA Assay (FDA approved for NASAL specimens in patients 102 years of age and older), is one component of a comprehensive surveillance program. It is not intended to diagnose infection nor to guide or monitor treatment. Performed at [facility], [address], [place], [place] [address]      Labs: BNP (last 3 results) Recent Labs    05/12/23 2022  BNP 214.7*   Basic Metabolic Panel: Recent Labs  Lab 05/17/23 0040 05/18/23 0123 05/19/23 0100 05/20/23 0123 05/21/23 0123  NA 137 138 138 134* 139  K 3.7 3.6 4.1 3.8 4.2  CL 109 109 109 106 105  CO2 20* 23 22 21* 23  GLUCOSE 298* 202* 271* 202* 193*  BUN 17 15 13 13 13   CREATININE 1.40* 1.37* 1.25* 1.20* 1.12*  CALCIUM 8.6* 8.8* 8.9 8.9 9.5  MG 1.8 1.9 1.9 1.9 2.1   Liver Function Tests: No results for input(s): "AST", "ALT", "ALKPHOS", "BILITOT", "PROT", "ALBUMIN" in the last 168 hours. No results for input(s): "LIPASE", "AMYLASE" in the last 168 hours. No results for input(s): "AMMONIA" in the last 168 hours. CBC: Recent Labs  Lab 05/17/23 0040 05/18/23 0123 05/19/23 0100 05/20/23 0123 05/21/23 0123  WBC 7.4 7.9 9.3  9.3 8.3  HGB 8.6* 8.8* 8.9* 9.1* 9.7*  HCT 28.0* 28.5* 28.8* 29.3* 30.9*  MCV 88.3 87.7 86.2 87.7 87.0  PLT 198 211 226 236 277   Cardiac Enzymes: No results for input(s): "CKTOTAL", "CKMB", "CKMBINDEX", "TROPONINI" in the last 168 hours. BNP: Invalid input(s): "POCBNP" CBG: Recent Labs  Lab 05/20/23 0619 05/20/23 1054 05/20/23 1652 05/20/23 2055 05/21/23 0550  GLUCAP 142* 178* 206* 182* 181*   D-Dimer No results for input(s): "DDIMER" in the last 72 hours. Hgb A1c No results for input(s): "HGBA1C" in the last 72 hours. Lipid Profile No results for input(s): "CHOL", "HDL", "LDLCALC", "TRIG", "CHOLHDL", "LDLDIRECT" in the last 72 hours. Thyroid function studies No results for input(s): "TSH", "T4TOTAL", "T3FREE", "THYROIDAB" in the last 72 hours.  Invalid input(s): "FREET3" Anemia work up No results for input(s): "VITAMINB12", "FOLATE", "FERRITIN", "TIBC", "IRON", "RETICCTPCT"  in the last 72 hours. Urinalysis    Component Value Date/Time   COLORURINE YELLOW 05/13/2023 0055   APPEARANCEUR CLEAR 05/13/2023 0055   LABSPEC 1.014 05/13/2023 0055   PHURINE 6.0 05/13/2023 0055   GLUCOSEU >=500 (A) 05/13/2023 0055   GLUCOSEU 100 (A) 09/07/2020 0831   HGBUR MODERATE (A) 05/13/2023 0055   BILIRUBINUR NEGATIVE 05/13/2023 0055   KETONESUR 5 (A) 05/13/2023 0055   PROTEINUR 30 (A) 05/13/2023 0055   UROBILINOGEN 0.2 09/07/2020 0831   NITRITE NEGATIVE 05/13/2023 0055   LEUKOCYTESUR TRACE (A) 05/13/2023 0055   Sepsis Labs Recent Labs  Lab 05/18/23 0123 05/19/23 0100 05/20/23 0123 05/21/23 0123  WBC 7.9 9.3 9.3 8.3   Microbiology Recent Results (from the past 240 hour(s))  Blood culture (routine x 2)     Status: None   Collection Time: 05/12/23  8:21 PM   Specimen: BLOOD  Result Value Ref Range Status   Specimen Description   Final    BLOOD LEFT ANTECUBITAL Performed at [facility], [address], [place], [place] [address]    Special Requests    Final    BOTTLES DRAWN AEROBIC AND ANAEROBIC Blood Culture adequate volume Performed at [facility], [address], [place], [place] [address]    Culture   Final    NO GROWTH 5 DAYS Performed at [facility], [address], [place], [place] [address]    Report Status 05/17/2023 FINAL  Final  Blood culture (routine x 2)     Status: Abnormal   Collection Time: 05/12/23  8:42 PM   Specimen: BLOOD  Result Value Ref Range Status   Specimen Description   Final    BLOOD RIGHT ANTECUBITAL Performed at [facility], [address], [place], [place] [address]    Special Requests   Final    BOTTLES DRAWN AEROBIC AND ANAEROBIC Blood Culture adequate volume Performed at [facility], [address] [place], [place] [address]    Culture  Setup Time   Final    GRAM POSITIVE COCCI IN CHAINS AEROBIC BOTTLE ONLY CRITICAL RESULT CALLED TO, READ BACK BY AND VERIFIED WITH: PHARMD J. LEDFORD 05/14/23 @ 0425 BY AB Performed at [facility], [address] [place], [place] [address]    Culture (A)  Final    STREPTOCOCCUS CONSTELLATUS STREPTOCOCCI, BETA HEMOLYTIC    Report Status 05/16/2023 FINAL  Final   Organism ID, Bacteria STREPTOCOCCUS CONSTELLATUS  Final      Susceptibility   Streptococcus constellatus - MIC*    PENICILLIN <=0.06 SENSITIVE Sensitive     CEFTRIAXONE <=0.12 SENSITIVE Sensitive     ERYTHROMYCIN <=0.12 SENSITIVE Sensitive     LEVOFLOXACIN 0.5 SENSITIVE Sensitive     VANCOMYCIN 0.5 SENSITIVE Sensitive     * STREPTOCOCCUS CONSTELLATUS  Blood Culture ID Panel (Reflexed)     Status: Abnormal   Collection Time: 05/12/23  8:42 PM  Result Value Ref Range Status   Enterococcus faecalis NOT DETECTED NOT DETECTED Final   Enterococcus Faecium NOT DETECTED NOT DETECTED Final   Listeria monocytogenes NOT DETECTED NOT DETECTED Final   Staphylococcus species NOT DETECTED NOT DETECTED Final   Staphylococcus aureus  (BCID) NOT DETECTED NOT DETECTED Final   Staphylococcus epidermidis NOT DETECTED NOT DETECTED Final   Staphylococcus lugdunensis NOT DETECTED NOT DETECTED Final   Streptococcus species DETECTED (A) NOT DETECTED Final    Comment: Not Enterococcus species, Streptococcus agalactiae, Streptococcus pyogenes, or Streptococcus pneumoniae.  CRITICAL RESULT CALLED TO, READ BACK BY AND VERIFIED WITH: PHARMD J. LEDFORD 05/14/23 @ 0425 BY AB    Streptococcus agalactiae NOT DETECTED NOT DETECTED Final   Streptococcus pneumoniae NOT DETECTED NOT DETECTED Final   Streptococcus pyogenes NOT DETECTED NOT DETECTED Final   A.calcoaceticus-baumannii NOT DETECTED NOT DETECTED Final   Bacteroides fragilis NOT DETECTED NOT DETECTED Final   Enterobacterales NOT DETECTED NOT DETECTED Final   Enterobacter cloacae complex NOT DETECTED NOT DETECTED Final   Escherichia coli NOT DETECTED NOT DETECTED Final   Klebsiella aerogenes NOT DETECTED NOT DETECTED Final   Klebsiella oxytoca NOT DETECTED NOT DETECTED Final   Klebsiella pneumoniae NOT DETECTED NOT DETECTED Final   Proteus species NOT DETECTED NOT DETECTED Final   Salmonella species NOT DETECTED NOT DETECTED Final   Serratia marcescens NOT DETECTED NOT DETECTED Final   Haemophilus influenzae NOT DETECTED NOT DETECTED Final   Neisseria meningitidis NOT DETECTED NOT DETECTED Final   Pseudomonas aeruginosa NOT DETECTED NOT DETECTED Final   Stenotrophomonas maltophilia NOT DETECTED NOT DETECTED Final   Candida albicans NOT DETECTED NOT DETECTED Final   Candida auris NOT DETECTED NOT DETECTED Final   Candida glabrata NOT DETECTED NOT DETECTED Final   Candida krusei NOT DETECTED NOT DETECTED Final   Candida parapsilosis NOT DETECTED NOT DETECTED Final   Candida tropicalis NOT DETECTED NOT DETECTED Final   Cryptococcus neoformans/gattii NOT DETECTED NOT DETECTED Final    Comment: Performed at [facility], [address] [place], [place] [address]  Wet prep,  genital     Status: None   Collection Time: 05/12/23  9:47 PM  Result Value Ref Range Status   Yeast Wet Prep HPF POC NONE SEEN NONE SEEN Final   Trich, Wet Prep NONE SEEN NONE SEEN Final   Clue Cells Wet Prep HPF POC NONE SEEN NONE SEEN Final   WBC, Wet Prep HPF POC <10 <10 Final   Sperm NONE SEEN  Final    Comment: Performed at [facility], [address], [place], [place] [address]  Surgical pcr screen     Status: Abnormal   Collection Time: 05/18/23  6:23 AM   Specimen: Nasal Mucosa; Nasal Swab  Result Value Ref Range Status   MRSA, PCR NEGATIVE NEGATIVE Final   Staphylococcus aureus POSITIVE (A) NEGATIVE Final    Comment: (NOTE) The Xpert SA Assay (FDA approved for NASAL specimens in patients 68 years of age and older), is one component of a comprehensive surveillance program. It is not intended to diagnose infection nor to guide or monitor treatment. Performed at [facility], [address], [place], [place] [address]      Time coordinating discharge: Over 30 minutes  SIGNED:   Azucena Fallen, DO [facility] 05/21/2023, 10:58 AM Pager   If 7PM-7AM, please contact night-coverage [email or web address]

## 2023-05-21 NOTE — TOC Transition Note (Signed)
Transition of Care (TOC) - CM/SW Discharge Note Donn Pierini RN, BSN Transitions of Care Unit 4E- RN Case Manager See Treatment Team for direct phone #   Patient Details  Name: Danielle Schroeder MRN: [id number] Date of Birth: [birth year]  Transition of Care [facility]) CM/SW Contact:  Darrold Span, RN Phone Number: 05/21/2023, 12:27 PM   Clinical Narrative:    Pt stable for transition home today,  Per previous CM note HH and DME has been arranged.   HHPT- set up with Frances Furbish, DME- RW- delivered to room per [employer].  No further TOC needs noted, pt has transportation home.    Final next level of care: Home w Home Health Services Barriers to Discharge: Barriers Resolved   Patient Goals and CMS Choice      Discharge Placement    Home w/ [facility]          Discharge Plan and Services Additional resources added to the After Visit Summary for     Discharge Planning Services: CM Consult Post Acute Care Choice: Durable Medical Equipment, Home Health          DME Arranged: Walker rolling DME Agency: [employer] Date DME Agency Contacted: 05/20/23     HH Arranged: PT HH Agency: [facility] Date [facility] Agency Contacted: 05/20/23      Social Determinants of Health (SDOH) Interventions SDOH Screenings   Food Insecurity: No Food Insecurity (05/13/2023)  Housing: Low Risk  (05/13/2023)  Transportation Needs: No Transportation Needs (05/13/2023)  Utilities: Not At Risk (05/13/2023)  Depression (PHQ2-9): Low Risk  (04/20/2023)  Recent Concern: Depression (PHQ2-9) - High Risk (04/16/2023)  Financial Resource Strain: Low Risk  (04/20/2023)  Physical Activity: Inactive (04/20/2023)  Stress: No Stress Concern Present (04/20/2023)  Tobacco Use: Low Risk  (05/19/2023)     Readmission Risk Interventions    05/21/2023   12:27 PM  Readmission Risk Prevention Plan  Transportation Screening Complete  PCP or Specialist Appt within 5-7 Days Complete  Home Care Screening Complete   Medication Review (RN CM) Complete

## 2023-05-21 NOTE — Progress Notes (Signed)
Discharge medications from [facility] pharmacy delivered to patient. Pt verbalized an understanding.

## 2023-05-21 NOTE — Progress Notes (Signed)
Occupational Therapy Treatment Patient Details Name: Danielle Schroeder MRN: [id number] DOB: 1956/06/02 Today's Date: 05/21/2023   History of present illness Pt is 67 year old presented to [facility] on  05/12/23 for sepsis due to rt great toe osteomyelitis. Pt transferred to [facility] on 05/13/23 for pending vascular intervention. Pt underwent aortogram, arteriogram with balloon angioplasty of right PT and peroneal arteries on 7/3. S/p transmetatarsal amputation R great toe 7/5. PMH - DM, PVD, chf, hypothyroid, htn, LE angioplasty and stenting, ckd   OT comments  Patient sitting EOB finishing lunch tray and agreeable to participate in OT treatment this date.  Patient re-educated on heel WB with darco shoe donned.  Patient requires min A for donning darco shoe with A for velcro straps.  Patient completed sit to stand from EOB with min A, functional mobility with RW SBA and toilet transfer completes with min guard.  Patient completes peri hygiene at seated level with Supervision and sit to stand from commode completes with min guard and use of grab bar to bring self to standing position.  Standing hygiene at sink completed with Supervision.  Patient would benefit from continued therapy while here and HHOT to address safety and independence in ADLs and IADLs    Recommendations for follow up therapy are one component of a multi-disciplinary discharge planning process, led by the attending physician.  Recommendations may be updated based on patient status, additional functional criteria and insurance authorization.    Assistance Recommended at Discharge Intermittent Supervision/Assistance  Patient can return home with the following  Assistance with cooking/housework;Help with stairs or ramp for entrance   Equipment Recommendations  None recommended by OT    Recommendations for Other Services      Precautions / Restrictions Precautions Precautions: Fall Precaution Comments: heel WB in darco shoe R  foot Restrictions Weight Bearing Restrictions: Yes RLE Weight Bearing:  (heel WB in Darco shoe)       Mobility Bed Mobility Overal bed mobility: Modified Independent             General bed mobility comments: HOB elevated, no assistance needed.    Transfers Overall transfer level: Needs assistance Equipment used: Rolling walker (2 wheels) Transfers: Sit to/from Stand Sit to Stand: Min assist           General transfer comment: min A to power up to standing position     Balance Overall balance assessment: Modified Independent Sitting-balance support: Feet supported, No upper extremity supported       Standing balance support: Single extremity supported                               ADL either performed or assessed with clinical judgement   ADL Overall ADL's : Needs assistance/impaired Eating/Feeding: Independent   Grooming: Wash/dry hands;Min guard;Cueing for safety   Upper Body Bathing: Independent   Lower Body Bathing: Min guard   Upper Body Dressing : Independent   Lower Body Dressing: Minimal assistance (to don darco shoes seated EOB, verbal cues required for)   Toilet Transfer: Minimal assistance;Cueing for safety Toilet Transfer Details (indicate cue type and reason): due to low height of toilet seat; patietn using grab abrs to pull self up to standing postition Toileting- Clothing Manipulation and Hygiene: Supervision/safety       Functional mobility during ADLs: Supervision/safety;Rolling walker (2 wheels);Cueing for safety (cues for WB through heel of foot in shoe)      Extremity/Trunk  Assessment Upper Extremity Assessment Upper Extremity Assessment: Overall WFL for tasks assessed   Lower Extremity Assessment RLE Sensation: history of peripheral neuropathy;decreased light touch        Vision       Perception     Praxis      Cognition Arousal/Alertness: Awake/alert Behavior During Therapy: WFL for tasks  assessed/performed Overall Cognitive Status: Within Functional Limits for tasks assessed                                          Exercises      Shoulder Instructions       General Comments      Pertinent Vitals/ Pain       Pain Assessment Pain Assessment: No/denies pain Pain Score: 0-No pain  Home Living                                          Prior Functioning/Environment              Frequency  Min 1X/week        Progress Toward Goals  OT Goals(current goals can now be found in the care plan section)  Progress towards OT goals: Progressing toward goals  Acute Rehab OT Goals OT Goal Formulation: With patient Time For Goal Achievement: 05/29/23 Potential to Achieve Goals: Good ADL Goals Pt Will Transfer to Toilet: with modified independence Pt Will Perform Toileting - Clothing Manipulation and hygiene: with modified independence  Plan Discharge plan remains appropriate    Co-evaluation                 AM-PAC OT "6 Clicks" Daily Activity     Outcome Measure   Help from another person eating meals?: None Help from another person taking care of personal grooming?: None Help from another person toileting, which includes using toliet, bedpan, or urinal?: A Little Help from another person bathing (including washing, rinsing, drying)?: A Little Help from another person to put on and taking off regular upper body clothing?: None Help from another person to put on and taking off regular lower body clothing?: A Little 6 Click Score: 21    End of Session Equipment Utilized During Treatment: Gait belt;Rolling walker (2 wheels)  OT Visit Diagnosis: Unsteadiness on feet (R26.81);Muscle weakness (generalized) (M62.81)   Activity Tolerance Patient tolerated treatment well   Patient Left in bed;with call bell/phone within reach   Nurse Communication Mobility status        Time: 1914-7829 OT Time Calculation  (min): 12 min  Charges: OT Treatments $Self Care/Home Management : 8-22 mins Governor Specking OT/L  Denice Paradise 05/21/2023, 1:39 PM

## 2023-05-22 ENCOUNTER — Telehealth: Payer: Self-pay

## 2023-05-22 ENCOUNTER — Telehealth: Payer: Self-pay | Admitting: Physician Assistant

## 2023-05-22 NOTE — Telephone Encounter (Signed)
-----   Message from [facility] [place] sent at 05/20/2023  7:39 AM EDT ----- S/p balloon angioplasty of right peroneal/PT arteries on 7/3 with right great toe amputation on 7/5  Please arrange follow up with PA in 2-3 wks for incision check on [place] office day

## 2023-05-22 NOTE — Transitions of Care (Post Inpatient/ED Visit) (Unsigned)
   05/22/2023  Name: Danielle Schroeder MRN: [id number] DOB: [birth year]  Today's TOC FU Call Status: Today's TOC FU Call Status:: Unsuccessul Call (1st Attempt) Unsuccessful Call (1st Attempt) Date: 05/22/23  Attempted to reach the patient regarding the most recent Inpatient/ED visit.  Follow Up Plan: Additional outreach attempts will be made to reach the patient to complete the Transitions of Care (Post Inpatient/ED visit) call.   Signature   Woodfin Ganja LPN [facility] Nurse Health Advisor Direct Dial [phone]

## 2023-05-22 NOTE — Transitions of Care (Post Inpatient/ED Visit) (Signed)
   05/22/2023  Name: Danielle Schroeder MRN: [id number] DOB: Mar 17, 1956  Today's TOC FU Call Status: Today's TOC FU Call Status:: Unsuccessul Call (1st Attempt) (Unable to leave message- Mailbox is full) Unsuccessful Call (1st Attempt) Date: 05/22/23  Attempted to reach the patient regarding the most recent Inpatient/ED visit.  Follow Up Plan: Additional outreach attempts will be made to reach the patient to complete the Transitions of Care (Post Inpatient/ED visit) call.   Signature: Olga Coaster, BCMA

## 2023-05-23 NOTE — Transitions of Care (Post Inpatient/ED Visit) (Signed)
05/23/2023  Name: Danielle Schroeder MRN: [id number] DOB: Dec 16, 1955  Today's TOC FU Call Status: Today's TOC FU Call Status:: Successful TOC FU Call Competed Unsuccessful Call (1st Attempt) Date: 05/22/23 [facility] FU Call Complete Date: 05/23/23  Transition Care Management Follow-up Telephone Call Date of Discharge: 05/21/23 Discharge Facility: [facility]) Type of Discharge: Inpatient Admission Primary Inpatient Discharge Diagnosis:: Osteomyelitis of right lower extremity How have you been since you were released from the hospital?: Better Any questions or concerns?: No  Items Reviewed: Did you receive and understand the discharge instructions provided?: Yes Medications obtained,verified, and reconciled?: Yes (Medications Reviewed) Any new allergies since your discharge?: No Dietary orders reviewed?: Yes Do you have support at home?: Yes  Medications Reviewed Today: Medications Reviewed Today     Reviewed by Merleen Nicely, LPN (Licensed Practical Nurse) on 05/23/23 at [phone]  Med List Status: <None>   Medication Order Taking? Sig Documenting Provider Last Dose Status Informant  acetaminophen (TYLENOL) 325 MG tablet [id number] Yes Take 2 tablets (650 mg total) by mouth every 4 (four) hours as needed for headache or mild pain. Azucena Fallen, MD Taking Active   amLODipine (NORVASC) 5 MG tablet [id number] Yes Take 1 tablet (5 mg total) by mouth daily. Azucena Fallen, MD Taking Active   aspirin EC 81 MG tablet [id number] Yes Take 81 mg by mouth in the morning. [provider] Taking Active Self, Pharmacy Records  cabergoline (DOSTINEX) 0.5 MG tablet [id number] No Take 1 tablet (0.5 mg total) by mouth 2 (two) times a week.  Patient not taking: Reported on 04/20/2023   Shamleffer, Konrad Dolores, MD Not Taking Active Self, Pharmacy Records           Med Note Tiburcio Pea, Luvenia Redden   Fri Apr 06, 2023 12:30 PM) Never started per patient  carvedilol (COREG) 25 MG tablet  [id number] Yes Take 25 mg by mouth 2 (two) times daily with a meal. [provider] Taking Active Self, Pharmacy Records  furosemide (LASIX) 40 MG tablet [id number] Yes TAKE 1 TABLET BY MOUTH TWICE A DAY Jake Bathe, MD Taking Active Self, Pharmacy Records  glucose blood test strip [id number] Yes 1 each by Other route 3 (three) times daily. Use as instructed [provider] Taking Active Self, Pharmacy Records  hydrocortisone 2.5 % ointment [id number] Yes Apply topically 2 (two) times daily. As needed for mild eczema.  Do not use for more than 1-2 weeks at a time.  Patient taking differently: Apply 1 Application topically daily.   [name], MD Taking Active Self, Pharmacy Records  insulin aspart (NOVOLOG FLEXPEN) 100 UNIT/ML FlexPen [id number] Yes Max daily 45 units  Patient taking differently: Inject 0-6 Units into the skin 3 (three) times daily before meals. Max daily 45 units   Shamleffer, Konrad Dolores, MD Taking Active Self, Pharmacy Records  Insulin Glargine [facility]) 100 UNIT/ML [id number] Yes Inject 40 Units into the skin daily. Shamleffer, Konrad Dolores, MD Taking Active Self, Pharmacy Records  Insulin Pen Needle (PEN NEEDLES) 31G X 5 MM MISC [id number] Yes 1 each by Does not apply route in the morning, at noon, in the evening, and at bedtime. Shamleffer, Konrad Dolores, MD Taking Active Self, Pharmacy Records  levothyroxine (SYNTHROID) 88 MCG tablet [id number] Yes Take 1 tablet (88 mcg total) by mouth daily. Shamleffer, Konrad Dolores, MD Taking Active Self, Pharmacy Records  potassium chloride (KLOR-CON) 10 MEQ tablet [id number] Yes Take 1 tablet (10 mEq total) by mouth daily. Janee Morn,  Ebbie Ridge, MD Taking Active Self, Pharmacy Records  simvastatin (ZOCOR) 20 MG tablet [id number] Yes Take 20 mg by mouth every evening. [provider] Taking Active Self, Pharmacy Records  SYRINGE-NEEDLE, DISP, 3 ML 25G X 1" 3 ML MISC [id number] Yes Inject 1 ml into  the skin 3 times daily. Romero Belling, MD Taking Active Self, Pharmacy Records  Vitamin D, Cholecalciferol, 25 MCG (1000 UT) CAPS [id number] Yes Take 2 capsules by mouth daily.  Patient taking differently: Take 2 capsules by mouth daily with lunch.   Romero Belling, MD Taking Active Self, Pharmacy Records            Home Care and Equipment/Supplies: Were Home Health Services Ordered?: Yes Name of Home Health Agency:: [facility] Has Agency set up a time to come to your home?: No Any new equipment or medical supplies ordered?: Yes Name of Medical supply agency?: [employer] Were you able to get the equipment/medical supplies?: Yes Do you have any questions related to the use of the equipment/supplies?: No  Functional Questionnaire: Do you need assistance with bathing/showering or dressing?: No Do you need assistance with meal preparation?: No Do you need assistance with eating?: No Do you have difficulty maintaining continence: No Do you need assistance with getting out of bed/getting out of a chair/moving?: No Do you have difficulty managing or taking your medications?: Yes  Follow up appointments reviewed: PCP Follow-up appointment confirmed?: No MD Provider Line [phone] Given: Yes Specialist Hospital Follow-up appointment confirmed?: Yes Date of Specialist follow-up appointment?: 05/29/23 Follow-Up Specialty Provider:: Sabino Niemann Do you need transportation to your follow-up appointment?: No Do you understand care options if your condition(s) worsen?: Yes-patient verbalized understanding    SIGNATURE  Woodfin Ganja LPN [facility] Nurse Health Advisor Direct Dial [phone]

## 2023-05-24 NOTE — Telephone Encounter (Signed)
1st no show, fee waived, letter sent 

## 2023-05-25 ENCOUNTER — Encounter (HOSPITAL_COMMUNITY): Payer: Medicare HMO

## 2023-05-25 ENCOUNTER — Ambulatory Visit: Payer: Self-pay

## 2023-05-25 ENCOUNTER — Telehealth: Payer: Self-pay | Admitting: Family Medicine

## 2023-05-25 NOTE — Telephone Encounter (Signed)
Pt has an hosp f/up scheduled for Tuesday 05/29/23 with Dr Janee Morn, she was admitted. Please call pt at [phone]

## 2023-05-28 NOTE — Telephone Encounter (Signed)
TOC done 7/9

## 2023-05-29 ENCOUNTER — Other Ambulatory Visit: Payer: Medicare HMO

## 2023-05-29 ENCOUNTER — Ambulatory Visit: Payer: Self-pay

## 2023-05-29 ENCOUNTER — Telehealth: Payer: Self-pay | Admitting: Family Medicine

## 2023-05-29 ENCOUNTER — Inpatient Hospital Stay: Payer: Medicare HMO | Admitting: Family Medicine

## 2023-05-29 NOTE — Telephone Encounter (Signed)
An advocate for pt called in stating she was not a no show for 05/14/23, she was still in the hospital. She did not get discharged until 05/21/23. Pt had another appt today 05/29/23 with [place] and another provider, she could not get through to our office to cancel.

## 2023-05-29 NOTE — Progress Notes (Signed)
05/29/2023 Name: Danielle Schroeder MRN: [id number] DOB: Oct 24, 1956  Danielle Schroeder is a 67 y.o. year old female who presented for a telephone visit.   They were referred to the pharmacist by their PCP for assistance in managing  CHF and DM .   Subjective:  Care Team: Primary Care Provider: [name], MD ; Next Scheduled Visit: 12/3 Endocrinologist Dr. Lonzo Cloud; Next Scheduled Visit: 06/26/23  Medication Access/Adherence Current Pharmacy:  [id number] [place], [place] - [address] [place] [address] Phone: [phone] Fax: [phone]  [place] [facility] [address] [place] [address] Phone: [phone] Fax: [phone]  [facility] [address] [place] [address] Phone: [phone] Fax: [phone]  -Patient reports affordability concerns with their medications: No  -Patient reports access/transportation concerns to their pharmacy: No  -Patient reports adherence concerns with their medications:  No -patient endorses good adherence to medications with no barriers, but dispense history does not reflect this for amlodipine, furosemide, potassium, carvelilol, Novolog, or cabergoline  Diabetes: Current medications: Novolog sliding scale up to 45 units daily, Basaglar 40 units daily  -Current glucose readings: FBG today 214, yesterday 142 -Patient endorses checking BG twice daily and states readings are high but "better than before" -Unable to provide exact sliding scale directions, but says yesterday she used 3 units of Novolog before breakfast when FBG was 214 -Patient denies hypoglycemic s/sx including dizziness, shakiness, sweating.  -Patient denies hyperglycemic symptoms including polyuria, polydipsia, polyphagia, nocturia, neuropathy, blurred vision. -Followed by Dr. Lonzo Cloud with endocrinology and has a follow-up appointment next month  Heart  Failure: Current medications:  ACEi/ARB/ARNI: None SGLT2i: None Beta blocker: carvedilol 25mg  BID Mineralocorticoid Receptor Antagonist: None Diuretic regimen: furosemide 80mg  qam CCB:  amlodipine 5mg  daily  -Current home blood pressure readings: 127/76  HLD/ASCVD Current Medications:  simvastatin 20mg  daily, ASA 81mg  daily -Clopidogrel therapy recently stopped  Objective: Lab Results  Component Value Date   HGBA1C 12.2 (H) 05/13/2023   Lab Results  Component Value Date   CREATININE 1.12 (H) 05/21/2023   BUN 13 05/21/2023   NA 139 05/21/2023   K 4.2 05/21/2023   CL 105 05/21/2023   CO2 23 05/21/2023   Lab Results  Component Value Date   CHOL 107 05/17/2023   HDL 35 (L) 05/17/2023   LDLCALC 50 05/17/2023   TRIG 110 05/17/2023   CHOLHDL 3.1 05/17/2023   Medications Reviewed Today     Reviewed by Merleen Nicely, LPN (Licensed Practical Nurse) on 05/23/23 at [phone]  Med List Status: <None>   Medication Order Taking? Sig Documenting Provider Last Dose Status Informant  acetaminophen (TYLENOL) 325 MG tablet [id number] Yes Take 2 tablets (650 mg total) by mouth every 4 (four) hours as needed for headache or mild pain. Azucena Fallen, MD Taking Active   amLODipine (NORVASC) 5 MG tablet [id number] Yes Take 1 tablet (5 mg total) by mouth daily. Azucena Fallen, MD Taking Active   aspirin EC 81 MG tablet [id number] Yes Take 81 mg by mouth in the morning. [provider] Taking Active Self, Pharmacy Records  cabergoline (DOSTINEX) 0.5 MG tablet [id number] No Take 1 tablet (0.5 mg total) by mouth 2 (two) times a week.  Patient not taking: Reported on 04/20/2023   Shamleffer, Konrad Dolores, MD Not Taking Active Self, Pharmacy Records           Med Note [place], Jacqulyn Bath T   Fri  Apr 06, 2023 12:30 PM) Never started per patient  carvedilol (COREG) 25 MG tablet [id number] Yes Take 25 mg by mouth 2 (two) times daily with a meal. [provider] Taking Active  Self, Pharmacy Records  furosemide (LASIX) 40 MG tablet [id number] Yes TAKE 1 TABLET BY MOUTH TWICE A DAY Jake Bathe, MD Taking Active Self, Pharmacy Records  glucose blood test strip [id number] Yes 1 each by Other route 3 (three) times daily. Use as instructed [provider] Taking Active Self, Pharmacy Records  hydrocortisone 2.5 % ointment [id number] Yes Apply topically 2 (two) times daily. As needed for mild eczema.  Do not use for more than 1-2 weeks at a time.  Patient taking differently: Apply 1 Application topically daily.   [name], MD Taking Active Self, Pharmacy Records  insulin aspart (NOVOLOG FLEXPEN) 100 UNIT/ML FlexPen [id number] Yes Max daily 45 units  Patient taking differently: Inject 0-6 Units into the skin 3 (three) times daily before meals. Max daily 45 units   Shamleffer, Konrad Dolores, MD Taking Active Self, Pharmacy Records  Insulin Glargine [facility]) 100 UNIT/ML [id number] Yes Inject 40 Units into the skin daily. Shamleffer, Konrad Dolores, MD Taking Active Self, Pharmacy Records  Insulin Pen Needle (PEN NEEDLES) 31G X 5 MM MISC [id number] Yes 1 each by Does not apply route in the morning, at noon, in the evening, and at bedtime. Shamleffer, Konrad Dolores, MD Taking Active Self, Pharmacy Records  levothyroxine (SYNTHROID) 88 MCG tablet [id number] Yes Take 1 tablet (88 mcg total) by mouth daily. Shamleffer, Konrad Dolores, MD Taking Active Self, Pharmacy Records  potassium chloride (KLOR-CON) 10 MEQ tablet [id number] Yes Take 1 tablet (10 mEq total) by mouth daily. [name], MD Taking Active Self, Pharmacy Records  simvastatin (ZOCOR) 20 MG tablet [id number] Yes Take 20 mg by mouth every evening. [provider] Taking Active Self, Pharmacy Records  SYRINGE-NEEDLE, DISP, 3 ML 25G X 1" 3 ML MISC [id number] Yes Inject 1 ml into the skin 3 times daily. Romero Belling, MD Taking Active Self, Pharmacy Records  Vitamin D, Cholecalciferol,  25 MCG (1000 UT) CAPS [id number] Yes Take 2 capsules by mouth daily.  Patient taking differently: Take 2 capsules by mouth daily with lunch.   Romero Belling, MD Taking Active Self, Pharmacy Records           Assessment/Plan:   Medication Access/Adherence: -Patient states cabergoline was never picked up or started- will notify Dr. Lonzo Cloud -Oral medications that show poor adherence may be more affordable to buy without billing insurance.  This could explain why patient-reported adherence and dispensing report vary  Diabetes: - Currently uncontrolled - Managed by Dr. Lonzo Cloud, so deferring care to her per provider request; but I do recommend consideration of GLP1 for hyperglycemic control (especially in setting of PAD and OSA) if no contraindications present.  Patient could also benefit from SGLT2 therapy once A1c better controlled.  May qualify for patient assistance programs help with affordability of these medications if prescribed. -Patient may also need further consult around meal-time insulin sliding scale since she was unable to explain to me what the exact directs for use are  Heart Failure: - Current opportunity for optimization but BP at goal currently - Losartan stopped during recent hospitalization based on kidney function; it appears nifedipine was also stopped and amlodipine started - Recommend initiation of SGLT2 therapy once A1c <9%   HLD/ASCVD -Currently controlled -Continue current regiment  Follow Up Plan: None scheduled at this time  but can as needed per PCP  Lenna Gilford, PharmD, DPLA

## 2023-05-29 NOTE — Telephone Encounter (Signed)
HH ORDERS   Caller Name: [facility] Agency Name: Randolm Idol Phone #: [phone]  Service Requested: 1. Blood sugar 214 today with no symptoms 2. Choosing not to take cabergoline (DOSTINEX) 0.5 MG tablet [[id number]] concerned with what it will do. 3.She is taking 2 pills in the am of furosemide (LASIX) 40 MG tablet [[id number]] He wants to know if this is correct. 4.Wounds on her feet requesting [facility] Nurse consultation 5.No BM since 7/8 after her surgery.

## 2023-05-29 NOTE — Telephone Encounter (Signed)
7.16.24 no  show letter sent

## 2023-05-29 NOTE — Telephone Encounter (Signed)
Left Rosanne Ashing a message with annotation below, pt no showed visit today to discuss any changes, pt needs an appointment, and to call back with any concerns.

## 2023-05-31 NOTE — Progress Notes (Signed)
Office Note     CC: Bilateral lower extremity wounds Requesting Provider:  [name], MD  HPI: Danielle Schroeder is a 67 y.o. (11-26-1955) female presenting in follow up s/p BLE angiograms  04/11/23 LLE SFA stent, AT ptx 05/16/23 RLE PT ptx, peroneal ptx 05/18/23 Danielle Schroeder - Right great toe amp   On exam today, Danielle Schroeder was doing well.  A native of [place], she retired from [employer] after 22 years, and now resides in [place].  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    She has been doing well since her toe amp. She hasnot changed the bandage since hospital discharge two weeks ago.  She continues to struggle with BLE edema, however wounds from prolonged compression stocking use have healed.  Denies new claudication, new rest pain, or new wounds.   Past Medical History:  Diagnosis Date   Cataract    bilateral lens implants   CHF (congestive heart failure) (HCC)    Diabetes mellitus without complication (HCC)    Hyperlipidemia    Hypertension    Pituitary adenoma with extrasellar extension (HCC)    Sleep apnea    no c-pap   Thyroid disease    hypothyroidism    Past Surgical History:  Procedure Laterality Date   ABDOMINAL AORTOGRAM W/LOWER EXTREMITY Left 04/11/2023   Procedure: ABDOMINAL AORTOGRAM W/LOWER EXTREMITY;  Surgeon: Victorino Sparrow, MD;  Location: [facility] INVASIVE CV LAB;  Service: Cardiovascular;  Laterality: Left;   ABDOMINAL AORTOGRAM W/LOWER EXTREMITY N/A 05/16/2023   Procedure: ABDOMINAL AORTOGRAM W/LOWER EXTREMITY;  Surgeon: Victorino Sparrow, MD;  Location: [facility] INVASIVE CV LAB;  Service: Cardiovascular;  Laterality: N/A;   AMPUTATION Right 05/18/2023   Procedure: RIGHT GREAT TOE AMPUTATION;  Surgeon: Maeola Harman, MD;  Location: [facility] OR;  Service: Vascular;  Laterality: Right;   BREAST SURGERY     CHOLECYSTECTOMY     PERIPHERAL VASCULAR BALLOON ANGIOPLASTY Left 04/11/2023   Procedure:  PERIPHERAL VASCULAR BALLOON ANGIOPLASTY;  Surgeon: Victorino Sparrow, MD;  Location: [facility] INVASIVE CV LAB;  Service: Cardiovascular;  Laterality: Left;  Lt AT   PERIPHERAL VASCULAR INTERVENTION Left 04/11/2023   Procedure: PERIPHERAL VASCULAR INTERVENTION;  Surgeon: Victorino Sparrow, MD;  Location: [facility] INVAS[facility] CV LAB;  Service: Cardiovascular;  Laterality: Left;  Lt SFA   PERIPHERAL VASCULAR INTERVENTION  05/16/2023   Procedure: PERIPHERAL VASCULAR INTERVENTION;  Surgeon: Victorino Sparrow, MD;  Location: [facility] INVASIVE CV LAB;  Service: Cardiovascular;;   TONSILLECTOMY     TUBAL LIGATION      Social History   Socioeconomic History   Marital status: Single    Spouse name: Not on file   Number of children: Not on file   Years of education: Not on file   Highest education level: Not on file  Occupational History   Not on file  Tobacco Use   Smoking status: Never   Smokeless tobacco: Never  Vaping Use   Vaping status: Never Used  Substance and Sexual Activity   Alcohol use: Never   Drug use: Never   Sexual activity: Not Currently  Other Topics Concern   Not on file  Social History Narrative   Not on file   Social Determinants of Health   Financial Resource Strain: Low Risk  (04/20/2023)   Overall Financial Resource Strain (CARDIA)    Difficulty of Paying Living Expenses: Not hard at all  Food Insecurity: No Food Insecurity (  05/13/2023)   Hunger Vital Sign    Worried About Running Out of Food in the Last Year: Never true    Ran Out of Food in the Last Year: Never true  Transportation Needs: No Transportation Needs (05/13/2023)   PRAPARE - Administrator, Civil Service (Medical): No    Lack of Transportation (Non-Medical): No  Physical Activity: Inactive (04/20/2023)   Exercise Vital Sign    Days of Exercise per Week: 0 days    Minutes of Exercise per Session: 0 min  Stress: No Stress Concern Present (04/20/2023)   [employer] of Occupational Health - Occupational Stress  Questionnaire    Feeling of Stress : Not at all  Social Connections: Not on File (03/02/2022)   Received from [place], [place]   Social Connections    Social Connections and Isolation: 0  Intimate Partner Violence: Not At Risk (05/13/2023)   Humiliation, Afraid, Rape, and Kick questionnaire    Fear of Current or Ex-Partner: No    Emotionally Abused: No    Physically Abused: No    Sexually Abused: No   Family History  Problem Relation Age of Onset   Thyroid disease Neg Hx    Colon cancer Neg Hx    Esophageal cancer Neg Hx    Rectal cancer Neg Hx    Stomach cancer Neg Hx     Current Outpatient Medications  Medication Sig Dispense Refill   acetaminophen (TYLENOL) 325 MG tablet Take 2 tablets (650 mg total) by mouth every 4 (four) hours as needed for headache or mild pain. 30 tablet 0   amLODipine (NORVASC) 5 MG tablet Take 1 tablet (5 mg total) by mouth daily. 30 tablet 0   aspirin EC 81 MG tablet Take 81 mg by mouth in the morning.     carvedilol (COREG) 25 MG tablet Take 25 mg by mouth 2 (two) times daily with a meal.     furosemide (LASIX) 40 MG tablet TAKE 1 TABLET BY MOUTH TWICE A DAY 180 tablet 3   glucose blood test strip 1 each by Other route 3 (three) times daily. Use as instructed     insulin aspart (NOVOLOG FLEXPEN) 100 UNIT/ML FlexPen Max daily 45 units (Patient taking differently: Inject 0-6 Units into the skin 3 (three) times daily before meals. Max daily 45 units) 45 mL 3   Insulin Glargine (BASAGLAR KWIKPEN) 100 UNIT/ML Inject 40 Units into the skin daily. 45 mL 3   Insulin Pen Needle (PEN NEEDLES) 31G X 5 MM MISC 1 each by Does not apply route in the morning, at noon, in the evening, and at bedtime. 400 each 3   levothyroxine (SYNTHROID) 88 MCG tablet Take 1 tablet (88 mcg total) by mouth daily. 90 tablet 3   potassium chloride (KLOR-CON) 10 MEQ tablet Take 1 tablet (10 mEq total) by mouth daily. 90 tablet 3   simvastatin (ZOCOR) 20 MG tablet Take 20 mg by mouth every  evening.     SYRINGE-NEEDLE, DISP, 3 ML 25G X 1" 3 ML MISC Inject 1 ml into the skin 3 times daily. (Patient not taking: Reported on 05/29/2023) 100 each 11   Vitamin D, Cholecalciferol, 25 MCG (1000 UT) CAPS Take 2 capsules by mouth daily. (Patient taking differently: Take 2 capsules by mouth daily with lunch.) 60 capsule 0   No current facility-administered medications for this visit.    No Known Allergies   REVIEW OF SYSTEMS:  [X]  denotes positive finding, [ ]  denotes negative  finding Cardiac  Comments:  Chest pain or chest pressure:    Shortness of breath upon exertion:    Short of breath when lying flat:    Irregular heart rhythm:        Vascular    Pain in calf, thigh, or hip brought on by ambulation:    Pain in feet at night that wakes you up from your sleep:     Blood clot in your veins:    Leg swelling:         Pulmonary    Oxygen at home:    Productive cough:     Wheezing:         Neurologic    Sudden weakness in arms or legs:     Sudden numbness in arms or legs:     Sudden onset of difficulty speaking or slurred speech:    Temporary loss of vision in one eye:     Problems with dizziness:         Gastrointestinal    Blood in stool:     Vomited blood:         Genitourinary    Burning when urinating:     Blood in urine:        Psychiatric    Major depression:         Hematologic    Bleeding problems:    Problems with blood clotting too easily:        Skin    Rashes or ulcers:        Constitutional    Fever or chills:      PHYSICAL EXAMINATION:  There were no vitals filed for this visit.   General:  WDWN in NAD; vital signs documented above Gait: Not observed HENT: WNL, normocephalic Pulmonary: normal non-labored breathing , without wheezing Cardiac: regular HR Abdomen: soft, NT, no masses Skin: without rashes Vascular Exam/Pulses:  Right Left  Radial 2+ (normal) 2+ (normal)  Ulnar    Femoral    Popliteal    DP 2+ (normal) 1+  PT      Extremities: withoutischemic changes, without Gangrene , without cellulitis; with open wounds;   Musculoskeletal: no muscle wasting or atrophy  Neurologic: A&O X 3;  No focal weakness or paresthesias are detected Psychiatric:  The pt has Normal affect.   Non-Invasive Vascular Imaging:   ABIs reviewed from 07/20/2022 demonstrating mild peripheral arterial disease on the right, moderate on the left. Right lower extremity venous reflux study demonstrates reflux throughout the greater saphenous vein.    ASSESSMENT/PLAN: ZAVANNAH DEBLOIS is a 67 y.o. 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   Amp site healing nicely which is surprising due to the lack of wound care.  Discussed the continued importance of elevation.   Stent patent. Palpable Dp's. Arterial duplex and ABI without concern.  Plan to see in two weeks for wound check Foot cleaned and bandaged.  Will work to get home nursing as she is unable to change her bandage.    Victorino Sparrow, MD [facility] [phone]

## 2023-06-01 ENCOUNTER — Encounter: Payer: Self-pay | Admitting: Vascular Surgery

## 2023-06-01 ENCOUNTER — Ambulatory Visit (INDEPENDENT_AMBULATORY_CARE_PROVIDER_SITE_OTHER)
Admission: RE | Admit: 2023-06-01 | Discharge: 2023-06-01 | Disposition: A | Discharge status: Home / Self Care | Payer: Medicare HMO | Source: Ambulatory Visit | Attending: Physician Assistant | Admitting: Physician Assistant

## 2023-06-01 ENCOUNTER — Ambulatory Visit (HOSPITAL_COMMUNITY)
Admission: RE | Admit: 2023-06-01 | Discharge: 2023-06-01 | Disposition: A | Discharge status: Home / Self Care | Payer: Medicare HMO | Source: Ambulatory Visit | Attending: Physician Assistant | Admitting: Physician Assistant

## 2023-06-01 ENCOUNTER — Ambulatory Visit (INDEPENDENT_AMBULATORY_CARE_PROVIDER_SITE_OTHER): Payer: Medicare HMO | Admitting: Vascular Surgery

## 2023-06-01 VITALS — BP 178/93 | HR 65 | Temp 97.2°F | Resp 16

## 2023-06-01 DIAGNOSIS — I70644 Atherosclerosis of nonbiological bypass graft(s) of the left leg with ulceration of heel and midfoot: Secondary | ICD-10-CM | POA: Insufficient documentation

## 2023-06-01 DIAGNOSIS — I872 Venous insufficiency (chronic) (peripheral): Secondary | ICD-10-CM

## 2023-06-01 DIAGNOSIS — Z89411 Acquired absence of right great toe: Secondary | ICD-10-CM

## 2023-06-01 DIAGNOSIS — Z95828 Presence of other vascular implants and grafts: Secondary | ICD-10-CM

## 2023-06-04 ENCOUNTER — Ambulatory Visit: Payer: Medicare HMO

## 2023-06-04 LAB — VAS US ABI WITH/WO TBI
Left ABI: 0.96
Right ABI: 0.63

## 2023-06-04 NOTE — Telephone Encounter (Signed)
Removed no show, pt was in the hospital

## 2023-06-04 NOTE — Telephone Encounter (Signed)
Removed no show.

## 2023-06-04 NOTE — Chronic Care Management (AMB) (Signed)
  Chronic Care Management   CCM RN Visit Note  06/04/2023 Name: BRENLEIGH COLLET MRN: [id number] DOB: 1956/08/05  Subjective: Glenice Laine Bringle is a 67 y.o. year old female who is a primary care patient of [name], MD. The patient was referred to the Chronic Care Management team for assistance with care management needs subsequent to provider initiation of CCM services and plan of care.    Today's Visit:  Engaged with patient by telephone for follow up visit.

## 2023-06-07 ENCOUNTER — Telehealth: Payer: Self-pay | Admitting: Family Medicine

## 2023-06-07 DIAGNOSIS — E1151 Type 2 diabetes mellitus with diabetic peripheral angiopathy without gangrene: Secondary | ICD-10-CM | POA: Diagnosis not present

## 2023-06-07 DIAGNOSIS — E1142 Type 2 diabetes mellitus with diabetic polyneuropathy: Secondary | ICD-10-CM | POA: Diagnosis not present

## 2023-06-07 DIAGNOSIS — G4733 Obstructive sleep apnea (adult) (pediatric): Secondary | ICD-10-CM | POA: Diagnosis not present

## 2023-06-07 DIAGNOSIS — E039 Hypothyroidism, unspecified: Secondary | ICD-10-CM | POA: Diagnosis not present

## 2023-06-07 DIAGNOSIS — I87309 Chronic venous hypertension (idiopathic) without complications of unspecified lower extremity: Secondary | ICD-10-CM | POA: Diagnosis not present

## 2023-06-07 DIAGNOSIS — Z008 Encounter for other general examination: Secondary | ICD-10-CM | POA: Diagnosis not present

## 2023-06-07 DIAGNOSIS — I509 Heart failure, unspecified: Secondary | ICD-10-CM | POA: Diagnosis not present

## 2023-06-07 DIAGNOSIS — M1990 Unspecified osteoarthritis, unspecified site: Secondary | ICD-10-CM | POA: Diagnosis not present

## 2023-06-07 DIAGNOSIS — R269 Unspecified abnormalities of gait and mobility: Secondary | ICD-10-CM | POA: Diagnosis not present

## 2023-06-07 DIAGNOSIS — E785 Hyperlipidemia, unspecified: Secondary | ICD-10-CM | POA: Diagnosis not present

## 2023-06-07 DIAGNOSIS — Z794 Long term (current) use of insulin: Secondary | ICD-10-CM | POA: Diagnosis not present

## 2023-06-07 DIAGNOSIS — E669 Obesity, unspecified: Secondary | ICD-10-CM | POA: Diagnosis not present

## 2023-06-07 DIAGNOSIS — I2510 Atherosclerotic heart disease of native coronary artery without angina pectoris: Secondary | ICD-10-CM | POA: Diagnosis not present

## 2023-06-07 LAB — LAB REPORT - SCANNED
EGFR: 60
Microalb Creat Ratio: 30

## 2023-06-07 NOTE — Telephone Encounter (Signed)
Home health certification fax came and I put in the dr box

## 2023-06-08 NOTE — Telephone Encounter (Signed)
  CLINICAL USE BELOW THIS LINE (use X to signify action taken)  __x_ Form received and placed in providers office for signature. ___ Form completed and faxed to LOA Dept.  ___ Form completed & LVM to notify patient ready for pick up.  ___ Charge sheet and copy of form in front office folder for office supervisor.

## 2023-06-11 ENCOUNTER — Other Ambulatory Visit (HOSPITAL_COMMUNITY)
Admission: RE | Admit: 2023-06-11 | Discharge: 2023-06-11 | Disposition: A | Discharge status: Home / Self Care | Payer: Medicare HMO | Source: Ambulatory Visit | Attending: Family Medicine | Admitting: Family Medicine

## 2023-06-11 ENCOUNTER — Ambulatory Visit (INDEPENDENT_AMBULATORY_CARE_PROVIDER_SITE_OTHER): Payer: Medicare HMO | Admitting: Family Medicine

## 2023-06-11 ENCOUNTER — Encounter: Payer: Self-pay | Admitting: Family Medicine

## 2023-06-11 VITALS — BP 134/75 | HR 75 | Temp 97.8°F | Wt 220.0 lb

## 2023-06-11 DIAGNOSIS — Z89619 Acquired absence of unspecified leg above knee: Secondary | ICD-10-CM

## 2023-06-11 DIAGNOSIS — I739 Peripheral vascular disease, unspecified: Secondary | ICD-10-CM | POA: Diagnosis not present

## 2023-06-11 DIAGNOSIS — Z9181 History of falling: Secondary | ICD-10-CM

## 2023-06-11 DIAGNOSIS — N898 Other specified noninflammatory disorders of vagina: Secondary | ICD-10-CM | POA: Insufficient documentation

## 2023-06-11 DIAGNOSIS — E039 Hypothyroidism, unspecified: Secondary | ICD-10-CM

## 2023-06-11 DIAGNOSIS — E1169 Type 2 diabetes mellitus with other specified complication: Secondary | ICD-10-CM

## 2023-06-11 DIAGNOSIS — Z09 Encounter for follow-up examination after completed treatment for conditions other than malignant neoplasm: Secondary | ICD-10-CM

## 2023-06-11 DIAGNOSIS — Z7982 Long term (current) use of aspirin: Secondary | ICD-10-CM

## 2023-06-11 DIAGNOSIS — I5032 Chronic diastolic (congestive) heart failure: Secondary | ICD-10-CM | POA: Diagnosis not present

## 2023-06-11 DIAGNOSIS — D631 Anemia in chronic kidney disease: Secondary | ICD-10-CM | POA: Diagnosis not present

## 2023-06-11 DIAGNOSIS — E1142 Type 2 diabetes mellitus with diabetic polyneuropathy: Secondary | ICD-10-CM

## 2023-06-11 DIAGNOSIS — N1832 Chronic kidney disease, stage 3b: Secondary | ICD-10-CM

## 2023-06-11 DIAGNOSIS — L89620 Pressure ulcer of left heel, unstageable: Secondary | ICD-10-CM | POA: Diagnosis not present

## 2023-06-11 DIAGNOSIS — Z4781 Encounter for orthopedic aftercare following surgical amputation: Secondary | ICD-10-CM | POA: Diagnosis not present

## 2023-06-11 DIAGNOSIS — E1165 Type 2 diabetes mellitus with hyperglycemia: Secondary | ICD-10-CM | POA: Diagnosis not present

## 2023-06-11 DIAGNOSIS — Z794 Long term (current) use of insulin: Secondary | ICD-10-CM | POA: Diagnosis not present

## 2023-06-11 DIAGNOSIS — Z95820 Peripheral vascular angioplasty status with implants and grafts: Secondary | ICD-10-CM

## 2023-06-11 DIAGNOSIS — I10 Essential (primary) hypertension: Secondary | ICD-10-CM

## 2023-06-11 DIAGNOSIS — E1122 Type 2 diabetes mellitus with diabetic chronic kidney disease: Secondary | ICD-10-CM | POA: Diagnosis not present

## 2023-06-11 DIAGNOSIS — Z89411 Acquired absence of right great toe: Secondary | ICD-10-CM | POA: Diagnosis not present

## 2023-06-11 DIAGNOSIS — E785 Hyperlipidemia, unspecified: Secondary | ICD-10-CM

## 2023-06-11 DIAGNOSIS — I130 Hypertensive heart and chronic kidney disease with heart failure and stage 1 through stage 4 chronic kidney disease, or unspecified chronic kidney disease: Secondary | ICD-10-CM | POA: Diagnosis not present

## 2023-06-11 DIAGNOSIS — I081 Rheumatic disorders of both mitral and tricuspid valves: Secondary | ICD-10-CM

## 2023-06-11 DIAGNOSIS — E1151 Type 2 diabetes mellitus with diabetic peripheral angiopathy without gangrene: Secondary | ICD-10-CM | POA: Diagnosis not present

## 2023-06-11 MED ORDER — FLUCONAZOLE 150 MG PO TABS
150.0000 mg | ORAL_TABLET | ORAL | 0 refills | Status: DC | PRN
Start: 2023-06-11 — End: 2024-08-27

## 2023-06-11 NOTE — Progress Notes (Signed)
Assessment/Plan:   Problem List Items Addressed This Visit       Cardiovascular and Mediastinum   Essential hypertension   Relevant Orders   Ambulatory referral to Home Health   PAD (peripheral artery disease) (HCC)   Relevant Orders   Ambulatory referral to Home Health     Endocrine   Type 2 diabetes mellitus with stage 3b chronic kidney disease, with long-term current use of insulin (HCC)    Elevated blood glucose levels with last A1c at 12.2.  Plan: Continue monitoring blood glucose levels. Follow up with endocrinologist on the 13th. Encourage adherence to diabetes management plan.        Other   History of diabetes-related lower limb amputation (HCC)    Recent right big toe amputation for osteomyelitis, right arterial stenting for blood flow.  Plan: Continue follow up with orthopedics and vascular specialists. Ensure home health services for wound care. Adjust home health services for more frequent bandage changes.      Relevant Orders   Ambulatory referral to Home Health   Vaginal discharge - Primary    Persistent vaginal discharge and itching, previously identified as yeast.  Plan: Administer Diflucan 150 mg tablet today and repeat in 3 days. Conduct a repeat vaginal swab for bacterial vaginosis and yeast today. Reassess symptoms and swab results in a week or adjust treatment accordingly. Declined STD testing.       Relevant Medications   fluconazole (DIFLUCAN) 150 MG tablet   Other Relevant Orders   Cervicovaginal ancillary only   Other Visit Diagnoses     Hospital discharge follow-up       Relevant Orders   Ambulatory referral to Home Health       There are no discontinued medications.  Return in about 1 week (around 06/18/2023), or if symptoms worsen or fail to improve, for vaginal dyscharge.    Subjective:   Encounter date: 06/11/2023  Danielle Schroeder is a 67 y.o. female who has Goiter; Acquired hypothyroidism; Pituitary adenoma with  extrasellar extension (HCC); Acromegaly (HCC); CHF (congestive heart failure) (HCC); Stable treated proliferative diabetic retinopathy of right eye determined by examination associated with type 2 diabetes mellitus (HCC); Proliferative diabetic retinopathy of left eye with macular edema associated with type 2 diabetes mellitus (HCC); Retinal hemorrhage of left eye; Retinal microaneurysm of both eyes; Retinal hemorrhage of right eye; Vitamin D deficiency; Vitreomacular adhesion, left; Type 2 diabetes mellitus with stage 3b chronic kidney disease, with long-term current use of insulin (HCC); DM2 (diabetes mellitus, type 2) (HCC); Pseudophakia, both eyes; Cataract; CKD stage 3b, GFR 30-44 ml/min (HCC); Diabetic neuropathy (HCC); Diabetic retinopathy associated with type 2 diabetes mellitus (HCC); Essential hypertension; HLD (hyperlipidemia); Obesity; Pituitary mass (HCC); Tubular adenoma of colon; 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   She  has a past medical history of Cataract, CHF (congestive heart failure) (HCC), Diabetes mellitus without complication (HCC), Hyperlipidemia, Hypertension, Pituitary adenoma with extrasellar extension (HCC), Sleep apnea, and Thyroid disease..   She presents with chief complaint of Hospitalization Follow-up (Osteomyelitis of right lower extremity (HCC) 05/12/2023. Patient states she has an yeast infection. Has itching no discharge x 1 month. has been tx with  vagisil with no relief. ) .  Chief Complaint: Follow-up for post-amputation care and management of yeast  infection.  History of Present Illness:  Diabetes Mellitus and Osteomyelitis: Danielle Schroeder has a history of diabetes mellitus, complicated by osteomyelitis that required hospitalization and amputation of her right big toe. She was hospitalized for 11 days due to osteomyelitis and arterial disease, leading to both a toe amputation and stenting of her right arteries for blood flow issues. She denies any fevers or chills post-discharge. She follows up regularly with orthopedics and vascular surgery.  Vaginal Yeast Infection: Danielle Schroeder reported a month-long history of vaginal discharge and itching prior to hospitalization. A hospital wet prep was negative. She was given IV vancomycin in the hospital, likely causing yeast.  She denies getting any medication for yeast infection in the hospital.    Past Surgical History:  Procedure Laterality Date   ABDOMINAL AORTOGRAM W/LOWER EXTREMITY Left 04/11/2023   Procedure: ABDOMINAL AORTOGRAM W/LOWER EXTREMITY;  Surgeon: Victorino Sparrow, MD;  Location: [facility] INVASIVE CV LAB;  Service: Cardiovascular;  Laterality: Left;   ABDOMINAL AORTOGRAM W/LOWER EXTREMITY N/A 05/16/2023   Procedure: ABDOMINAL AORTOGRAM W/LOWER EXTREMITY;  Surgeon: Victorino Sparrow, MD;  Location: [facility] INVASIVE CV LAB;  Service: Cardiovascular;  Laterality: N/A;   AMPUTATION Right 05/18/2023   Procedure: RIGHT GREAT TOE AMPUTATION;  Surgeon: Maeola Harman, MD;  Location: [facility] OR;  Service: Vascular;  Laterality: Right;   BREAST SURGERY     CHOLECYSTECTOMY     PERIPHERAL VASCULAR BALLOON ANGIOPLASTY Left 04/11/2023   Procedure: PERIPHERAL VASCULAR BALLOON ANGIOPLASTY;  Surgeon: Victorino Sparrow, MD;  Location: [facility] INVASIVE CV LAB;  Service: Cardiovascular;  Laterality: Left;  Lt AT   PERIPHERAL VASCULAR INTERVENTION Left 04/11/2023   Procedure: PERIPHERAL VASCULAR INTERVENTION;  Surgeon: Victorino Sparrow, MD;  Location: [facility] INVASIVE CV LAB;  Service: Cardiovascular;  Laterality: Left;  Lt SFA    PERIPHERAL VASCULAR INTERVENTION  05/16/2023   Procedure: PERIPHERAL VASCULAR INTERVENTION;  Surgeon: Victorino Sparrow, MD;  Location[facility] INVASIVE CV LAB;  Service: Cardiovascular;;   TONSILLECTOMY     TUBAL LIGATION      Outpatient Medications Prior to Visit  Medication Sig Dispense Refill   acetaminophen (TYLENOL) 325 MG tablet Take 2 tablets (650 mg total) by mouth every 4 (four) hours as needed for headache or mild pain. 30 tablet 0   amLODipine (NORVASC) 5 MG tablet Take 1 tablet (5 mg total) by mouth daily. 30 tablet 0   aspirin EC 81 MG tablet Take 81 mg by mouth in the morning.     carvedilol (COREG) 25 MG tablet Take 25 mg by mouth 2 (two) times daily with a meal.     furosemide (LASIX) 40 MG tablet TAKE 1 TABLET BY MOUTH TWICE A DAY 180 tablet 3   glucose blood test strip 1 each by Other route 3 (three) times daily. Use as instructed     insulin aspart (NOVOLOG FLEXPEN) 100 UNIT/ML FlexPen Max daily 45 units (Patient taking differently: Inject 0-6 Units into the skin 3 (three) times daily before meals. Max daily 45 units) 45 mL 3   Insulin Glargine (BASAGLAR KWIKPEN) 100 UNIT/ML Inject 40 Units into the skin daily. 45 mL 3   Insulin Pen Needle (PEN NEEDLES) 31G X 5 MM MISC 1 each by Does not apply route in the morning, at noon, in the evening, and at bedtime. 400 each 3   levothyroxine (SYNTHROID) 88 MCG tablet Take 1 tablet (88 mcg total)  by mouth daily. 90 tablet 3   potassium chloride (KLOR-CON) 10 MEQ tablet Take 1 tablet (10 mEq total) by mouth daily. 90 tablet 3   simvastatin (ZOCOR) 20 MG tablet Take 20 mg by mouth every evening.     SYRINGE-NEEDLE, DISP, 3 ML 25G X 1" 3 ML MISC Inject 1 ml into the skin 3 times daily. 100 each 11   Vitamin D, Cholecalciferol, 25 MCG (1000 UT) CAPS Take 2 capsules by mouth daily. (Patient taking differently: Take 2 capsules by mouth daily with lunch.) 60 capsule 0   No facility-administered medications prior to visit.    Family History   Problem Relation Age of Onset   Thyroid disease Neg Hx    Colon cancer Neg Hx    Esophageal cancer Neg Hx    Rectal cancer Neg Hx    Stomach cancer Neg Hx     Social History   Socioeconomic History   Marital status: Single    Spouse name: Not on file   Number of children: Not on file   Years of education: Not on file   Highest education level: Not on file  Occupational History   Not on file  Tobacco Use   Smoking status: Never   Smokeless tobacco: Never  Vaping Use   Vaping status: Never Used  Substance and Sexual Activity   Alcohol use: Never   Drug use: Never   Sexual activity: Not Currently  Other Topics Concern   Not on file  Social History Narrative   Not on file   Social Determinants of Health   Financial Resource Strain: Low Risk  (04/20/2023)   Overall Financial Resource Strain (CARDIA)    Difficulty of Paying Living Expenses: Not hard at all  Food Insecurity: No Food Insecurity (05/13/2023)   Hunger Vital Sign    Worried About Running Out of Food in the Last Year: Never true    Ran Out of Food in the Last Year: Never true  Transportation Needs: No Transportation Needs (05/13/2023)   PRAPARE - Administrator, Civil Service (Medical): No    Lack of Transportation (Non-Medical): No  Physical Activity: Inactive (04/20/2023)   Exercise Vital Sign    Days of Exercise per Week: 0 days    Minutes of Exercise per Session: 0 min  Stress: No Stress Concern Present (04/20/2023)   [employer] of Occupational Health - Occupational Stress Questionnaire    Feeling of Stress : Not at all  Social Connections: Not on File (03/02/2022)   Received from [place], [place]   Social Connections    Social Connections and Isolation: 0  Intimate Partner Violence: Not At Risk (05/13/2023)   Humiliation, Afraid, Rape, and Kick questionnaire    Fear of Current or Ex-Partner: No    Emotionally Abused: No    Physically Abused: No    Sexually Abused: No                                                                                                   Objective:  Physical Exam: BP 134/75 Comment: home  Pulse 75   Temp 97.8 F (36.6 C) (Temporal)   Wt 220 lb (99.8 kg)   SpO2 99%   BMI 34.46 kg/m     Physical Exam Constitutional:      General: She is not in acute distress.    Appearance: Normal appearance. She is not ill-appearing or toxic-appearing.  HENT:     Head: Normocephalic and atraumatic.     Nose: Nose normal. No congestion.  Eyes:     General: No scleral icterus.    Extraocular Movements: Extraocular movements intact.  Cardiovascular:     Rate and Rhythm: Normal rate and regular rhythm.     Pulses: Normal pulses.     Heart sounds: Normal heart sounds.  Pulmonary:     Effort: Pulmonary effort is normal. No respiratory distress.     Breath sounds: Normal breath sounds.  Abdominal:     General: Abdomen is flat. Bowel sounds are normal.     Palpations: Abdomen is soft.  Musculoskeletal:        General: Normal range of motion.     Left Lower Extremity: Left leg is amputated below knee.  Feet:     Comments: Right toe currently wrapped and in boot Lymphadenopathy:     Cervical: No cervical adenopathy.  Skin:    General: Skin is warm and dry.     Findings: No rash.  Neurological:     General: No focal deficit present.     Mental Status: She is alert and oriented to person, place, and time. Mental status is at baseline.  Psychiatric:        Mood and Affect: Mood normal.        Behavior: Behavior normal.        Thought Content: Thought content normal.        Judgment: Judgment normal.     VAS [place] LOWER EXTREMITY ARTERIAL DUPLEX  Result Date: 06/04/2023 LOWER EXTREMITY ARTERIAL DUPLEX STUDY Patient Name:  JAMAIYA TUOMI  Date of Exam:   06/01/2023 Medical Rec #: [id number]          Accession #:    [id number] Date of Birth: March 09, 1956          Patient Gender: F Patient Age:   3 years Exam Location:  [facility]  Procedure:      VAS [place] LOWER EXTREMITY ARTERIAL DUPLEX Referring Phys: Graceann Congress --------------------------------------------------------------------------------  Indications: Peripheral artery disease. 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  Vascular Interventions: 04/11/23: Left SFA stent , balloon angioplasty left ATA.                         05/12/23: Right PTA, peroneal balloon angioplasty. Current ABI:            Right 0.63/amp Left 0.96/0.49 Comparison Study: none Performing Technologist: Thereasa Parkin RVT  Examination Guidelines: A complete evaluation includes B-mode imaging, spectral Doppler, color Doppler, and power Doppler as needed of all accessible portions of each vessel. Bilateral testing is considered an integral part of a complete examination. Limited examinations for reoccurring indications may be performed as noted.  +-----------+--------+-----+--------+----------+--------------+ LEFT       PSV cm/sRatioStenosisWaveform  Comments       +-----------+--------+-----+--------+----------+--------------+ CFA Distal 115                  monophasic               +-----------+--------+-----+--------+----------+--------------+ DFA  103                  biphasic                 +-----------+--------+-----+--------+----------+--------------+ SFA Prox   132                  monophasic               +-----------+--------+-----+--------+----------+--------------+ SFA Mid    142                  monophasic               +-----------+--------+-----+--------+----------+--------------+ SFA Distal 124                  monophasic               +-----------+--------+-----+--------+----------+--------------+ POP Prox   126                  monophasic               +-----------+--------+-----+--------+----------+--------------+ POP Distal 81                   monophasic                +-----------+--------+-----+--------+----------+--------------+ ATA Distal 74                   monophasic               +-----------+--------+-----+--------+----------+--------------+ PTA Distal 117                  monophasic               +-----------+--------+-----+--------+----------+--------------+ PERO Distal                               not visualized +-----------+--------+-----+--------+----------+--------------+  Summary: Left: No evidence of stenosis. Stent walls not visualized.  See table(s) above for measurements and observations. Electronically signed by Heath Lark on 06/04/2023 at 8:19:15 AM.    Final    VAS [place] ABI WITH/WO TBI  Result Date: 06/04/2023  LOWER EXTREMITY DOPPLER STUDY Patient Name:  HERMIONE HAUSKNECHT  Date of Exam:   06/01/2023 Medical Rec #: [id number]          Accession #:    [id number] Date of Birth: 1956/04/16          Patient Gender: F Patient Age:   59 years Exam Location:  [facility] Procedure:      VAS [place] ABI WITH/WO TBI Referring Phys: Graceann Congress --------------------------------------------------------------------------------  Indications: Peripheral artery disease. 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  Vascular Interventions: 04/11/23: Left SFA stent , balloon angioplasty left ATA.                         05/12/23: Right PTA, peroneal balloon angioplasty. Limitations: Today's exam was limited due to bandages. Performing Technologist: Thereasa Parkin RVT  Examination Guidelines: A complete evaluation includes at minimum, Doppler waveform signals and systolic blood pressure reading at the level of bilateral brachial, anterior tibial, and posterior tibial arteries, when vessel segments are accessible. Bilateral testing is considered an integral part of a complete examination. Photoelectric Plethysmograph (PPG) waveforms and toe systolic pressure readings are included as required and additional duplex testing as  needed. Limited examinations for reoccurring indications may  be performed as noted.  ABI Findings: +---------+-----------------+-----+----------+---------------------------------+ Right    Rt Pressure      IndexWaveform  Comment                                    (mmHg)                                                            +---------+-----------------+-----+----------+---------------------------------+ Brachial 162                                                               +---------+-----------------+-----+----------+---------------------------------+ PTA      102              0.63 monophasic                                  +---------+-----------------+-----+----------+---------------------------------+ DP       0                0.00           not found--may be due to                                                   bandaging                         +---------+-----------------+-----+----------+---------------------------------+ Great Toe                                amputation                        +---------+-----------------+-----+----------+---------------------------------+ +---------+------------------+-----+----------+-------+ Left     Lt Pressure (mmHg)IndexWaveform  Comment +---------+------------------+-----+----------+-------+ Brachial 155                                      +---------+------------------+-----+----------+-------+ PTA      76                0.47 monophasic        +---------+------------------+-----+----------+-------+ DP       156               0.96 monophasic        +---------+------------------+-----+----------+-------+ Great Toe80                0.49                   +---------+------------------+-----+----------+-------+ +-------+-----------+-----------+------------+------------+ ABI/TBIToday's ABIToday's TBIPrevious ABIPrevious TBI +-------+-----------+-----------+------------+------------+  Right  0.63                  0.92  0.43         +-------+-----------+-----------+------------+------------+ Left   0.96       0.59       0.59        0.36         +-------+-----------+-----------+------------+------------+  Previous ABI at [facility] on 07/20/22.  Summary: Right: Resting right ankle-brachial index indicates moderate right lower extremity arterial disease. Left: Resting left ankle-brachial index is within normal range. The left toe-brachial index is abnormal. *See table(s) above for measurements and observations.  Electronically signed by Heath Lark on 06/04/2023 at 8:18:05 AM.    Final    PERIPHERAL VASCULAR CATHETERIZATION  Result Date: 05/16/2023 Images from the original result were not included.   Patient name: LACHINA BRUN      MRN: [id number]        DOB: 1956-01-06          Sex: female  05/12/2023 - 05/16/2023 Pre-operative Diagnosis: Right lower extremity critical ischemia with tissue loss toe Post-operative diagnosis:  Same Surgeon:  Victorino Sparrow, MD Procedure Performed: 1.  Ultrasound-guided access of the left common femoral artery 2.  Second-order cannulation, right lower extremity angiogram 3.  Third cannulation, right lower extremity angiogram from the superficial femoral artery 4.  Third order cannulation, please order angiogram 5.  Balloon angioplasty 3 x 100 posterior tibial artery, peroneal artery 6.  Moderate sedation time 44, contrast volume 50ml 7.  Device assisted closure-Pro-glide   Indications: Patient is a 67 year old female well-known to my practice recently had left lower extremity critical ischemia with tissue loss.  She underwent intervention, and this is since healed.  She presented with new right lower extremity tissue loss at the great toe with depressed ABIs, nonpalpable pulse.  After discussing risk and benefits of right lower extremity angiogram in an effort to define and treat lesions to improve distal perfusion for wound healing, Alethia  elected to proceed.  Findings: Aortogram: Deferred-see study from last month-no flow-limiting stenosis On the right: Widely patent common iliac artery, external neck artery, hypogastric artery.  Widely patent common femoral artery, profunda, superficial femoral artery, popliteal artery.  Severe tibial disease. Single-vessel inline flow via the peroneal artery. Severe tibial disease with anterior tibial artery occlusion 3 cm from the ostia, peroneal artery with 2, tandem, 80% flow-limiting lesions proximally.  Proximal posterior tibial artery occlusion with reconstitution at the mid posterior tibial artery continuing into the foot via the plantar branches.  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             Procedure:  The patient was identified in the holding area and taken to room 8.  The patient was then placed supine on the table and prepped and draped in the usual sterile fashion.  A time out was called.  Ultrasound was used to evaluate the left common femoral artery.  It was patent .  A digital ultrasound image was acquired.  A micropuncture needle was used to access the left common femoral artery under ultrasound guidance.  An 018 wire was advanced without resistance and a micropuncture sheath was placed.  The 018 wire was removed and a benson wire was placed.  The micropuncture sheath was exchanged for a 5 french sheath.  Angiogram was deferred as 1 was completed last month.  An Omni Flush catheter was used to traverse the aortic arch.  The Omni Flush catheter was parked in the right common iliac artery and pelvic angiogram followed.  It was then positioned in the  right common femoral artery for right lower extremity angiogram to the foot.  See results above.    I elected to attempt intervention on the posterior tibial and peroneal arteries.  A 6 French by 60 cm sheath was brought onto the field and parked in the superficial femoral artery.  Diagnostic angiography followed from the superficial femoral  artery to map the popliteal artery, peroneal artery, posterior tibial artery.  The patient was heparinized.  I was able to use a series of wires and catheters to cannulate the posterior tibial artery and cross the occlusion.  The wire was sent below the level of the ankle into a plantar branch.  I followed this with a catheter and angiography to ensure I was true lumen.  I was, angiography from this site showed that the plantar arch was incomplete.  Next, we used a 3 x 100 mm balloon to angioplasty the proximal portion of the posterior tibial artery this was inflated for 3 minutes.  Follow-up angiography demonstrated excellent result with resolution of the occlusion.  The vessel was widely patent.  Next, I pulled the balloon and wire back and recannulated the peroneal artery.  The same 3 x 100 mm balloon was used to plasty the peroneal artery.  Follow-up angiography demonstrated excellent result with resolution of the tandem, flow-limiting stenoses.   This was an excellent result.  Patient was closed with the use of a ProGlide device.  Impression: Recanalization of the posterior tibial artery.  Resolution of flow-limiting stenosis of the peroneal artery.  Two-vessel runoff to the foot with severe microvascular disease appreciated distally.   Fara Olden, MD [facility] Office: [phone]    ECHOCARDIOGRAM COMPLETE  Result Date: 05/14/2023    ECHOCARDIOGRAM REPORT   Patient Name:   ARIDAY WEICHMAN Date of Exam: 05/14/2023 Medical Rec #:  [id number]         Height:       67.0 in Accession #:    [id number]        Weight:       221.6 lb Date of Birth:  [birth year]         BSA:          2.112 m Patient Age:    66 years          BP:           141/80 mmHg Patient Gender: F                 HR:           86 bpm. Exam Location:  Inpatient Procedure: 2D Echo, Cardiac Doppler and Color Doppler Indications:    [id number]  History:        Patient has prior history of Echocardiogram  examinations, most                 recent 02/03/2020. CHF, PAD; Risk Factors:Hypertension, Sleep                 Apnea, Diabetes and Dyslipidemia. CKD, stage 3.  Sonographer:    Lucendia Herrlich Referring Phys: [id number] ANAND D HONGALGI IMPRESSIONS  1. Left ventricular ejection fraction, by estimation, is 60 to 65%. The left ventricle has normal function. The left ventricle has no regional wall motion abnormalities. There is mild left ventricular hypertrophy of the basal-septal segment. Left ventricular diastolic parameters are consistent with Grade I diastolic dysfunction (impaired relaxation).  2. Right ventricular systolic function is normal. The right  ventricular size is normal. There is normal pulmonary artery systolic pressure. The estimated right ventricular systolic pressure is 31.8 mmHg.  3. The mitral valve is degenerative. Trivial mitral valve regurgitation. Mild mitral stenosis. The mean mitral valve gradient is 3.6 mmHg with average heart rate of 84 bpm. Moderate mitral annular calcification.  4. The aortic valve is tricuspid. Aortic valve regurgitation is not visualized.  5. The inferior vena cava is normal in size with <50% respiratory variability, suggesting right atrial pressure of 8 mmHg. Comparison(s): Changes from prior study are noted. 02/03/2020: LVEF 55-60%. FINDINGS  Left Ventricle: Left ventricular ejection fraction, by estimation, is 60 to 65%. The left ventricle has normal function. The left ventricle has no regional wall motion abnormalities. The left ventricular internal cavity size was normal in size. There is  mild left ventricular hypertrophy of the basal-septal segment. Left ventricular diastolic parameters are consistent with Grade I diastolic dysfunction (impaired relaxation). Indeterminate filling pressures. Right Ventricle: The right ventricular size is normal. No increase in right ventricular wall thickness. Right ventricular systolic function is normal. There is normal pulmonary  artery systolic pressure. The tricuspid regurgitant velocity is 2.44 m/s, and  with an assumed right atrial pressure of 8 mmHg, the estimated right ventricular systolic pressure is 31.8 mmHg. Left Atrium: Left atrial size was normal in size. Right Atrium: Right atrial size was normal in size. Pericardium: There is no evidence of pericardial effusion. Mitral Valve: The mitral valve is degenerative in appearance. There is mild calcification of the anterior and posterior mitral valve leaflet(s). Moderate mitral annular calcification. Trivial mitral valve regurgitation. Mild mitral valve stenosis. The mean mitral valve gradient is 3.6 mmHg with average heart rate of 84 bpm. Tricuspid Valve: The tricuspid valve is grossly normal. Tricuspid valve regurgitation is trivial. Aortic Valve: The aortic valve is tricuspid. Aortic valve regurgitation is not visualized. Aortic valve peak gradient measures 10.1 mmHg. Pulmonic Valve: The pulmonic valve was normal in structure. Pulmonic valve regurgitation is not visualized. Aorta: The aortic root and ascending aorta are structurally normal, with no evidence of dilitation. Venous: The inferior vena cava is normal in size with less than 50% respiratory variability, suggesting right atrial pressure of 8 mmHg. IAS/Shunts: No atrial level shunt detected by color flow Doppler.  LEFT VENTRICLE PLAX 2D LVIDd:         3.90 cm   Diastology LVIDs:         2.80 cm   LV e' medial:    7.93 cm/s LV PW:         0.90 cm   LV E/e' medial:  14.6 LV IVS:        1.30 cm   LV e' lateral:   10.90 cm/s LVOT diam:     2.20 cm   LV E/e' lateral: 10.6 LV SV:         77 LV SV Index:   37 LVOT Area:     3.80 cm  RIGHT VENTRICLE             IVC RV S prime:     12.40 cm/s  IVC diam: 1.70 cm TAPSE (M-mode): 2.1 cm LEFT ATRIUM             Index        RIGHT ATRIUM          Index LA diam:        3.70 cm 1.75 cm/m   RA Area:     9.31 cm LA Vol (A2C):  53.3 ml 25.21 ml/m  RA Volume:   15.60 ml 7.39 ml/m LA Vol  (A4C):   69.2 ml 32.76 ml/m LA Biplane Vol: 71.5 ml 33.85 ml/m  AORTIC VALVE AV Area (Vmax): 2.93 cm AV Vmax:        159.00 cm/s AV Peak Grad:   10.1 mmHg LVOT Vmax:      122.67 cm/s LVOT Vmean:     78.133 cm/s LVOT VTI:       0.203 m  AORTA Ao Root diam: 3.20 cm Ao Asc diam:  3.70 cm MITRAL VALVE                TRICUSPID VALVE MV Area (PHT): 3.65 cm     TR Peak grad:   23.8 mmHg MV Mean grad:  3.6 mmHg     TR Vmax:        244.00 cm/s MV Decel Time: 208 msec MV E velocity: 116.00 cm/s  SHUNTS MV A velocity: 150.00 cm/s  Systemic VTI:  0.20 m MV E/A ratio:  0.77         Systemic Diam: 2.20 cm Zoila Shutter MD Electronically signed by Zoila Shutter MD Signature Date/Time: 05/14/2023/3:51:40 PM    Final    DG Foot Complete Right  Result Date: 05/12/2023 CLINICAL DATA:  Great toe wound, concern for osteomyelitis EXAM: RIGHT FOOT COMPLETE - 3+ VIEW COMPARISON:  None Available. FINDINGS: No fracture or dislocation of the right foot. Mild arthrosis. Bony erosion of the tuft of the right great toe. Overlying soft tissue wound. Diffuse soft tissue edema about the foot and ankle. IMPRESSION: 1. Bony erosion of the tuft of the right great toe, concerning for osteomyelitis. Overlying soft tissue wound. 2. No fracture or dislocation of the right foot. 3. Diffuse soft tissue edema about the foot and ankle. Electronically Signed   By: Jearld Lesch M.D.   On: 05/12/2023 21:10   PERIPHERAL VASCULAR CATHETERIZATION  Result Date: 04/11/2023 Images from the original result were not included. 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  Ultrasound-guided micropuncture access of the right common femoral artery 2.  Aortogram 3.  Secondary cannulation, left  lower extremity angiogram 4.  Third order cannulation, angiogram from the superficial femoral artery 5.  Third order cannulation, angiogram from the popliteal artery 6.  Third order cannulation, angiogram from the anterior tibial artery 7.  Superficial femoral artery stenting 6 x 80 mm Innova postdilated using a 5 x 80 mm drug-coated balloon 8.  Balloon angioplasty of the anterior tibial artery 3 x 220 mm Indications: Patient is a 67 year old female with mixed arterial venous disease.  She has had wounds on bilateral feet for several months due to fluid overload and wearing compression stockings that are too small.  The right sided wound is nearly healed, the left side is still present.  The left side also has a heel wound.  After discussing risk and benefits of left lower extremity angiogram in an effort to define and improve distal perfusion to improve wound healing, Shalae elected to proceed. 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  Popliteal artery widely patent with no flow-limiting stenosis.  Severe tibial disease with no inline flow to the foot.  The anterior tibial artery is initially patent, but occludes for roughly 150 cm prior to reconstitution at the distal tibia.  The peroneal artery provides the majority of the runoff to the mid tibia prior to becoming atretic and giving off several collaterals.  The posterior tibial artery is not appreciated and does not fill the foot.  Procedure:  The patient was identified in the holding area and taken to room 8.  The patient was then placed supine on the table and prepped and draped in the usual sterile fashion.  A time out was called.  Ultrasound was used to evaluate the right common femoral artery.   It was patent .  A digital ultrasound image was acquired.  A micropuncture needle was used to access the right common femoral artery under ultrasound guidance.  An 018 wire was advanced without resistance and a micropuncture sheath was placed.  The 018 wire was removed and a benson wire was placed.  The micropuncture sheath was exchanged for a 5 french sheath.  An omniflush catheter was advanced over the wire to the level of L-1.  An abdominal angiogram was obtained.  Next, using the omniflush catheter and a benson wire, the aortic bifurcation was crossed and the catheter was placed into the left external iliac artery and left runoff was obtained. 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  The patient was heparinized and a 6 x 65 cm catheter was brought onto the field and parked in the mid superficial femoral artery.  Angiography was used to further define the lesion in the superficial femoral artery.  There were multiple collaterals and the occlusion appeared to be roughly 50 mm in length.  A series of wires and catheters were used to cross the superficial femoral artery occlusion.  Angiography followed from the popliteal artery to ensure that I was true lumen prior to moving to stenting.  There was no drug-eluting stent available, therefore a 6 x 80 mm Innova stent was placed followed by drug-coated balloon angioplasty using a 5 x 80 mm balloon.  Follow-up angiography demonstrated excellent result with resolution of the occlusion.  There was brisk flow through the stent with no stenosis.  Next, my attention turned to the anterior tibial artery.  Using a series of wires and catheters, I was able to cross the 150 cm occlusion.  Diagnostic angiography followed from the distal anterior tibial artery to prove that I was true lumen.  There was excellent runoff into  the dorsalis pedis artery.  Next, a 3 x 220 mm balloon was brought to the field and inflated for 3 minutes.  Follow-up angiography demonstrated excellent result with resolution of the occlusion. Impression: Successful recanalization of the superficial femoral artery with stenting 6 x 80 mm.  Successful recanalization of the anterior tibial artery with balloon angioplasty 3 x 220 mm.  At completion, the patient had inline flow to the foot via single-vessel anterior tibial artery runoff to the dorsalis pedis artery. Fara Olden, MD [facility] Office: [phone]   VAS [place] LOWER EXTREMITY VENOUS REFLUX  Result Date: 03/23/2023  Lower Venous Reflux Study Patient Name:  GAEL BEVELS  Date of Exam:  03/23/2023 Medical Rec #: [id number]          Accession #:    [id number] Date of Birth: 12/15/1955          Patient Gender: F Patient Age:   48 years Exam Location:  [facility] Procedure:      VAS [place] LOWER EXTREMITY VENOUS REFLUX Referring Phys: JOSHUA ROBINS --------------------------------------------------------------------------------  Indications: Swelling, and venous insufficiency.  Performing Technologist: Thereasa Parkin RVT  Examination Guidelines: A complete evaluation includes B-mode imaging, spectral Doppler, color Doppler, and power Doppler as needed of all accessible portions of each vessel. Bilateral testing is considered an integral part of a complete examination. Limited examinations for reoccurring indications may be performed as noted. The reflux portion of the exam is performed with the patient in reverse Trendelenburg. Significant venous reflux is defined as >500 ms in the superficial venous system, and >1 second in the deep venous system.  Venous Reflux Times +--------------+---------+------+-----------+------------+--------+ RIGHT         Reflux NoRefluxReflux TimeDiameter cmsComments                         Yes                                   +--------------+---------+------+-----------+------------+--------+ CFV           no                                             +--------------+---------+------+-----------+------------+--------+ FV prox       no                                             +--------------+---------+------+-----------+------------+--------+ FV mid        no                                             +--------------+---------+------+-----------+------------+--------+ FV dist       no                                             +--------------+---------+------+-----------+------------+--------+ Popliteal     no                                             +--------------+---------+------+-----------+------------+--------+ GSV at SFJ              yes    >500 ms     0.389             +--------------+---------+------+-----------+------------+--------+ GSV prox thighno                           0.353             +--------------+---------+------+-----------+------------+--------+ GSV mid thigh  yes    >500 ms     0.353             +--------------+---------+------+-----------+------------+--------+ GSV dist thigh          yes    >500 ms     0.584             +--------------+---------+------+-----------+------------+--------+ GSV at knee             yes    >500 ms     0.414             +--------------+---------+------+-----------+------------+--------+ GSV prox calf           yes    >500 ms     0.401             +--------------+---------+------+-----------+------------+--------+ SSV Pop Fossa no                            0.21             +--------------+---------+------+-----------+------------+--------+ SSV prox calf no                            0.26             +--------------+---------+------+-----------+------------+--------+ SSV mid calf  no                           0.322              +--------------+---------+------+-----------+------------+--------+   Summary: Right: - No evidence of deep vein thrombosis seen in the right lower extremity, from the common femoral through the popliteal veins. - No evidence of superficial venous thrombosis in the right lower extremity. - No evidence of deep vein reflux. - Superficial vein reflux in the SFJ and GSV from mid thigh to proximal calf.  *See table(s) above for measurements and observations. Electronically signed by Gerarda Fraction on 03/23/2023 at 6:16:40 PM.    Final     Recent Results (from the past 2160 hour(s))  I-STAT, chem 8     Status: Abnormal   Collection Time: 04/11/23  8:55 AM  Result Value Ref Range   Sodium 138 135 - 145 mmol/L   Potassium 3.6 3.5 - 5.1 mmol/L   Chloride 103 98 - 111 mmol/L   BUN 13 8 - 23 mg/dL   Creatinine, Ser 4.78 (H) 0.44 - 1.00 mg/dL   Glucose, Bld 295 (H) 70 - 99 mg/dL    Comment: Glucose reference range applies only to samples taken after fasting for at least 8 hours.   Calcium, Ion 1.10 (L) 1.15 - 1.40 mmol/L   TCO2 22 22 - 32 mmol/L   Hemoglobin 11.6 (L) 12.0 - 15.0 g/dL   HCT 62.1 (L) 30.8 - 65.7 %  POCT Activated clotting time     Status: None   Collection Time: 04/11/23 11:13 AM  Result Value Ref Range   Activated Clotting Time 217 seconds    Comment: Reference range 74-137 seconds for patients not on anticoagulant therapy.  Glucose, capillary     Status: Abnormal   Collection Time: 04/11/23 11:45 AM  Result Value Ref Range   Glucose-Capillary 216 (H) 70 - 99 mg/dL    Comment: Glucose reference range applies only to samples taken after fasting for at least 8 hours.   Comment 1 Notify RN    Comment 2 Document in  Chart   Microalbumin / creatinine urine ratio     Status: Abnormal   Collection Time: 04/16/23  2:04 PM  Result Value Ref Range   Microalb, Ur 2.8 (H) 0.0 - 1.9 mg/dL   Creatinine,U 161.0 mg/dL   Microalb Creat Ratio 1.6 0.0 - 30.0 mg/g  Hepatitis C Antibody     Status:  None   Collection Time: 04/16/23  2:04 PM  Result Value Ref Range   Hepatitis C Ab NON-REACTIVE NON-REACTIVE    Comment: . HCV antibody was non-reactive. There is no laboratory  evidence of HCV infection. . In most cases, no further action is required. However, if recent HCV exposure is suspected, a test for HCV RNA (test code 96045) is suggested. . For additional information please refer to http://education.[facility].com/faq/FAQ22v1 (This link is being provided for informational/ educational purposes only.) .   Basic metabolic panel     Status: Abnormal   Collection Time: 05/12/23  7:20 PM  Result Value Ref Range   Sodium 129 (L) 135 - 145 mmol/L   Potassium 3.9 3.5 - 5.1 mmol/L   Chloride 94 (L) 98 - 111 mmol/L   CO2 23 22 - 32 mmol/L   Glucose, Bld 482 (H) 70 - 99 mg/dL    Comment: Glucose reference range applies only to samples taken after fasting for at least 8 hours.   BUN 16 8 - 23 mg/dL   Creatinine, Ser 4.09 (H) 0.44 - 1.00 mg/dL   Calcium 8.6 (L) 8.9 - 10.3 mg/dL   GFR, Estimated 32 (L) >60 mL/min    Comment: (NOTE) Calculated using the CKD-EPI Creatinine Equation (2021)    Anion gap 12 5 - 15    Comment: Performed at [facility], [address], [place], [place] [address]  CBC     Status: Abnormal   Collection Time: 05/12/23  7:20 PM  Result Value Ref Range   WBC 12.3 (H) 4.0 - 10.5 K/uL   RBC 3.61 (L) 3.87 - 5.11 MIL/uL   Hemoglobin 9.8 (L) 12.0 - 15.0 g/dL   HCT 47.8 (L) 29.5 - 62.1 %   MCV 86.1 80.0 - 100.0 fL   MCH 27.1 26.0 - 34.0 pg   MCHC 31.5 30.0 - 36.0 g/dL   RDW 30.8 65.7 - 84.6 %   Platelets 231 150 - 400 K/uL   nRBC 0.0 0.0 - 0.2 %    Comment: Performed at [facility], [address] [place], [place] [address]  CBG monitoring, ED     Status: Abnormal   Collection Time: 05/12/23  7:55 PM  Result Value Ref Range   Glucose-Capillary 429 (H) 70 - 99 mg/dL    Comment: Glucose reference range  applies only to samples taken after fasting for at least 8 hours.  Lactic acid, plasma     Status: None   Collection Time: 05/12/23  8:21 PM  Result Value Ref Range   Lactic Acid, Venous 1.8 0.5 - 1.9 mmol/L    Comment: Performed at [facility], [address] [place], [place] [address]  Blood culture (routine x 2)     Status: None   Collection Time: 05/12/23  8:21 PM   Specimen: BLOOD  Result Value Ref Range   Specimen Description      BLOOD LEFT ANTECUBITAL Performed at [facility], [address], [place], [place] [address]    Special Requests      BOTTLES DRAWN AEROBIC AND ANAEROBIC Blood  Culture adequate volume Performed at [facility], [address] [place], [place] [address]    Culture      NO GROWTH 5 DAYS Performed at [facility], [address], [place], [place] [address]    Report Status 05/17/2023 FINAL   Sedimentation rate     Status: Abnormal   Collection Time: 05/12/23  8:21 PM  Result Value Ref Range   Sed Rate 61 (H) 0 - 22 mm/hr    Comment: Performed at [facility], [address] [place], [place] [address]  C-reactive protein     Status: Abnormal   Collection Time: 05/12/23  8:21 PM  Result Value Ref Range   CRP 4.9 (H) <1.0 mg/dL    Comment: Performed at [facility], [address], [place], [place] [address]  Brain natriuretic peptide     Status: Abnormal   Collection Time: 05/12/23  8:22 PM  Result Value Ref Range   B Natriuretic Peptide 214.7 (H) 0.0 - 100.0 pg/mL    Comment: Performed at [facility], [address] [place], [place] [address]  Blood culture (routine x 2)     Status: Abnormal   Collection Time: 05/12/23  8:42 PM   Specimen: BLOOD  Result Value Ref Range   Specimen Description      BLOOD RIGHT ANTECUBITAL Performed at [facility], [address], [place], [place] [address]    Special Requests       BOTTLES DRAWN AEROBIC AND ANAEROBIC Blood Culture adequate volume Performed at [facility], [address] [place], [place] [address]    Culture  Setup Time      GRAM POSITIVE COCCI IN CHAINS AEROBIC BOTTLE ONLY CRITICAL RESULT CALLED TO, READ BACK BY AND VERIFIED WITH: PHARMD J. LEDFORD 05/14/23 @ 0425 BY AB Performed at [facility], [address], [place], [place] [address]    Culture (A)     STREPTOCOCCUS CONSTELLATUS STREPTOCOCCI, BETA HEMOLYTIC    Report Status 05/16/2023 FINAL    Organism ID, Bacteria STREPTOCOCCUS CONSTELLATUS       Susceptibility   Streptococcus constellatus - MIC*    PENICILLIN <=0.06 SENSITIVE Sensitive     CEFTRIAXONE <=0.12 SENSITIVE Sensitive     ERYTHROMYCIN <=0.12 SENSITIVE Sensitive     LEVOFLOXACIN 0.5 SENSITIVE Sensitive     VANCOMYCIN 0.5 SENSITIVE Sensitive     * STREPTOCOCCUS CONSTELLATUS  Blood Culture ID Panel (Reflexed)     Status: Abnormal   Collection Time: 05/12/23  8:42 PM  Result Value Ref Range   Enterococcus faecalis NOT DETECTED NOT DETECTED   Enterococcus Faecium NOT DETECTED NOT DETECTED   Listeria monocytogenes NOT DETECTED NOT DETECTED   Staphylococcus species NOT DETECTED NOT DETECTED   Staphylococcus aureus (BCID) NOT DETECTED NOT DETECTED   Staphylococcus epidermidis NOT DETECTED NOT DETECTED   Staphylococcus lugdunensis NOT DETECTED NOT DETECTED   Streptococcus species DETECTED (A) NOT DETECTED    Comment: Not Enterococcus species, Streptococcus agalactiae, Streptococcus pyogenes, or Streptococcus pneumoniae. CRITICAL RESULT CALLED TO, READ BACK BY AND VERIFIED WITH: PHARMD J. LEDFORD 05/14/23 @ 0425 BY AB    Streptococcus agalactiae NOT DETECTED NOT DETECTED   Streptococcus pneumoniae NOT DETECTED NOT DETECTED   Streptococcus pyogenes NOT DETECTED NOT DETECTED   A.calcoaceticus-baumannii NOT DETECTED NOT DETECTED   Bacteroides fragilis NOT DETECTED NOT DETECTED   Enterobacterales NOT  DETECTED NOT DETECTED   Enterobacter cloacae complex NOT DETECTED NOT  DETECTED   Escherichia coli NOT DETECTED NOT DETECTED   Klebsiella aerogenes NOT DETECTED NOT DETECTED   Klebsiella oxytoca NOT DETECTED NOT DETECTED   Klebsiella pneumoniae NOT DETECTED NOT DETECTED   Proteus species NOT DETECTED NOT DETECTED   Salmonella species NOT DETECTED NOT DETECTED   Serratia marcescens NOT DETECTED NOT DETECTED   Haemophilus influenzae NOT DETECTED NOT DETECTED   Neisseria meningitidis NOT DETECTED NOT DETECTED   Pseudomonas aeruginosa NOT DETECTED NOT DETECTED   Stenotrophomonas maltophilia NOT DETECTED NOT DETECTED   Candida albicans NOT DETECTED NOT DETECTED   Candida auris NOT DETECTED NOT DETECTED   Candida glabrata NOT DETECTED NOT DETECTED   Candida krusei NOT DETECTED NOT DETECTED   Candida parapsilosis NOT DETECTED NOT DETECTED   Candida tropicalis NOT DETECTED NOT DETECTED   Cryptococcus neoformans/gattii NOT DETECTED NOT DETECTED    Comment: Performed at [facility], [address], [place], [place] [address]  Wet prep, genital     Status: None   Collection Time: 05/12/23  9:47 PM  Result Value Ref Range   Yeast Wet Prep HPF POC NONE SEEN NONE SEEN   Trich, Wet Prep NONE SEEN NONE SEEN   Clue Cells Wet Prep HPF POC NONE SEEN NONE SEEN   WBC, Wet Prep HPF POC <10 <10   Sperm NONE SEEN     Comment: Performed at [facility], [address], [place], [place] [address]  CBG monitoring, ED     Status: Abnormal   Collection Time: 05/12/23 10:52 PM  Result Value Ref Range   Glucose-Capillary 380 (H) 70 - 99 mg/dL    Comment: Glucose reference range applies only to samples taken after fasting for at least 8 hours.  Urinalysis, Routine w reflex microscopic -Urine, Clean Catch     Status: Abnormal   Collection Time: 05/13/23 12:55 AM  Result Value Ref Range   Color, Urine YELLOW YELLOW   APPearance CLEAR CLEAR   Specific Gravity, Urine 1.014 1.005 - 1.030    pH 6.0 5.0 - 8.0   Glucose, UA >=500 (A) NEGATIVE mg/dL   Hgb urine dipstick MODERATE (A) NEGATIVE   Bilirubin Urine NEGATIVE NEGATIVE   Ketones, ur 5 (A) NEGATIVE mg/dL   Protein, ur 30 (A) NEGATIVE mg/dL   Nitrite NEGATIVE NEGATIVE   Leukocytes,Ua TRACE (A) NEGATIVE   RBC / HPF 6-10 0 - 5 RBC/hpf   WBC, UA 6-10 0 - 5 WBC/hpf   Bacteria, UA RARE (A) NONE SEEN   Squamous Epithelial / HPF 0-5 0 - 5 /HPF    Comment: Performed at [facility], [address], [place], [place] [address]  CBG monitoring, ED     Status: Abnormal   Collection Time: 05/13/23  2:44 AM  Result Value Ref Range   Glucose-Capillary 337 (H) 70 - 99 mg/dL    Comment: Glucose reference range applies only to samples taken after fasting for at least 8 hours.  CBG monitoring, ED     Status: Abnormal   Collection Time: 05/13/23  4:23 AM  Result Value Ref Range   Glucose-Capillary 306 (H) 70 - 99 mg/dL    Comment: Glucose reference range applies only to samples taken after fasting for at least 8 hours.  Glucose, capillary     Status: Abnormal   Collection Time: 05/13/23  8:02 AM  Result Value Ref Range   Glucose-Capillary 267 (H) 70 - 99 mg/dL    Comment: Glucose reference range applies only  to samples taken after fasting for at least 8 hours.  Magnesium     Status: None   Collection Time: 05/13/23 10:51 AM  Result Value Ref Range   Magnesium 1.7 1.7 - 2.4 mg/dL    Comment: Performed at [facility], [address], [place], [place] [address]  Protime-INR     Status: None   Collection Time: 05/13/23 10:51 AM  Result Value Ref Range   Prothrombin Time 15.1 11.4 - 15.2 seconds   INR 1.2 0.8 - 1.2    Comment: (NOTE) INR goal varies based on device and disease states. Performed at [facility], [address] [place], [place] [address]   CBC with Differential/Platelet     Status: Abnormal   Collection Time: 05/13/23 10:51 AM  Result Value Ref Range    WBC 9.8 4.0 - 10.5 K/uL   RBC 3.27 (L) 3.87 - 5.11 MIL/uL   Hemoglobin 8.8 (L) 12.0 - 15.0 g/dL   HCT 09.8 (L) 11.9 - 14.7 %   MCV 86.2 80.0 - 100.0 fL   MCH 26.9 26.0 - 34.0 pg   MCHC 31.2 30.0 - 36.0 g/dL   RDW 82.9 56.2 - 13.0 %   Platelets 200 150 - 400 K/uL   nRBC 0.0 0.0 - 0.2 %   Neutrophils Relative % 84 %   Neutro Abs 8.3 (H) 1.7 - 7.7 K/uL   Lymphocytes Relative 8 %   Lymphs Abs 0.8 0.7 - 4.0 K/uL   Monocytes Relative 7 %   Monocytes Absolute 0.7 0.1 - 1.0 K/uL   Eosinophils Relative 1 %   Eosinophils Absolute 0.1 0.0 - 0.5 K/uL   Basophils Relative 0 %   Basophils Absolute 0.0 0.0 - 0.1 K/uL   Immature Granulocytes 0 %   Abs Immature Granulocytes 0.04 0.00 - 0.07 K/uL    Comment: Performed at [facility], [address], [place], [place] [address]  Comprehensive metabolic panel     Status: Abnormal   Collection Time: 05/13/23 10:51 AM  Result Value Ref Range   Sodium 132 (L) 135 - 145 mmol/L   Potassium 3.5 3.5 - 5.1 mmol/L   Chloride 99 98 - 111 mmol/L   CO2 25 22 - 32 mmol/L   Glucose, Bld 317 (H) 70 - 99 mg/dL    Comment: Glucose reference range applies only to samples taken after fasting for at least 8 hours.   BUN 15 8 - 23 mg/dL   Creatinine, Ser 4.69 (H) 0.44 - 1.00 mg/dL   Calcium 8.7 (L) 8.9 - 10.3 mg/dL   Total Protein 6.4 (L) 6.5 - 8.1 g/dL   Albumin 2.4 (L) 3.5 - 5.0 g/dL   AST 13 (L) 15 - 41 U/L   ALT 11 0 - 44 U/L   Alkaline Phosphatase 107 38 - 126 U/L   Total Bilirubin 0.9 0.3 - 1.2 mg/dL   GFR, Estimated 43 (L) >60 mL/min    Comment: (NOTE) Calculated using the CKD-EPI Creatinine Equation (2021)    Anion gap 8 5 - 15    Comment: Performed at [facility], [address], [place], [place] [address]  Hemoglobin A1c     Status: Abnormal   Collection Time: 05/13/23 10:51 AM  Result Value Ref Range   Hgb A1c MFr Bld 12.2 (H) 4.8 - 5.6 %    Comment: (NOTE)         Prediabetes: 5.7 - 6.4  Diabetes:  >6.4         Glycemic control for adults with diabetes: <7.0    Mean Plasma Glucose 303 mg/dL    Comment: (NOTE) Performed At: [facility] [address] [place], [place] [id number] Jolene Schimke MD WU:[id number]   Glucose, capillary     Status: Abnormal   Collection Time: 05/13/23  1:25 PM  Result Value Ref Range   Glucose-Capillary 306 (H) 70 - 99 mg/dL    Comment: Glucose reference range applies only to samples taken after fasting for at least 8 hours.  Glucose, capillary     Status: Abnormal   Collection Time: 05/13/23  4:22 PM  Result Value Ref Range   Glucose-Capillary 291 (H) 70 - 99 mg/dL    Comment: Glucose reference range applies only to samples taken after fasting for at least 8 hours.  Glucose, capillary     Status: Abnormal   Collection Time: 05/13/23  9:18 PM  Result Value Ref Range   Glucose-Capillary 214 (H) 70 - 99 mg/dL    Comment: Glucose reference range applies only to samples taken after fasting for at least 8 hours.  CBC     Status: Abnormal   Collection Time: 05/14/23  1:27 AM  Result Value Ref Range   WBC 9.1 4.0 - 10.5 K/uL   RBC 3.46 (L) 3.87 - 5.11 MIL/uL   Hemoglobin 9.1 (L) 12.0 - 15.0 g/dL   HCT 95.6 (L) 21.3 - 08.6 %   MCV 85.0 80.0 - 100.0 fL   MCH 26.3 26.0 - 34.0 pg   MCHC 31.0 30.0 - 36.0 g/dL   RDW 57.8 46.9 - 62.9 %   Platelets 211 150 - 400 K/uL   nRBC 0.0 0.0 - 0.2 %    Comment: Performed at [facility], [address] [place], [place] [address]  Basic metabolic panel     Status: Abnormal   Collection Time: 05/14/23  1:27 AM  Result Value Ref Range   Sodium 133 (L) 135 - 145 mmol/L   Potassium 3.3 (L) 3.5 - 5.1 mmol/L   Chloride 99 98 - 111 mmol/L   CO2 24 22 - 32 mmol/L   Glucose, Bld 213 (H) 70 - 99 mg/dL    Comment: Glucose reference range applies only to samples taken after fasting for at least 8 hours.   BUN 13 8 - 23 mg/dL   Creatinine, Ser 3.24 (H) 0.44 - 1.00 mg/dL   Calcium 8.5 (L) 8.9 - 10.3 mg/dL    GFR, Estimated 44 (L) >60 mL/min    Comment: (NOTE) Calculated using the CKD-EPI Creatinine Equation (2021)    Anion gap 10 5 - 15    Comment: Performed at [facility], [address], [place], [place] [address]  Glucose, capillary     Status: Abnormal   Collection Time: 05/14/23  6:10 AM  Result Value Ref Range   Glucose-Capillary 218 (H) 70 - 99 mg/dL    Comment: Glucose reference range applies only to samples taken after fasting for at least 8 hours.  Glucose, capillary     Status: Abnormal   Collection Time: 05/14/23 12:20 PM  Result Value Ref Range   Glucose-Capillary 303 (H) 70 - 99 mg/dL    Comment: Glucose reference range applies only to samples taken after fasting for at least 8 hours.   Comment 1 Notify RN    Comment 2 Document in Chart   ECHOCARDIOGRAM COMPLETE     Status:  None   Collection Time: 05/14/23  2:47 PM  Result Value Ref Range   Weight 3,545 oz   Height 67 in   BP 141/80 mmHg   S' Lateral 2.80 cm   AR max vel 2.93 cm2   AV Peak grad 10.1 mmHg   Ao pk vel 1.59 m/s   Area-P 1/2 3.65 cm2   Est EF 60 - 65%   Glucose, capillary     Status: Abnormal   Collection Time: 05/14/23  4:47 PM  Result Value Ref Range   Glucose-Capillary 222 (H) 70 - 99 mg/dL    Comment: Glucose reference range applies only to samples taken after fasting for at least 8 hours.   Comment 1 Notify RN    Comment 2 Document in Chart   Glucose, capillary     Status: Abnormal   Collection Time: 05/14/23  8:37 PM  Result Value Ref Range   Glucose-Capillary 282 (H) 70 - 99 mg/dL    Comment: Glucose reference range applies only to samples taken after fasting for at least 8 hours.  CBC     Status: Abnormal   Collection Time: 05/15/23  1:13 AM  Result Value Ref Range   WBC 7.6 4.0 - 10.5 K/uL   RBC 3.46 (L) 3.87 - 5.11 MIL/uL   Hemoglobin 9.4 (L) 12.0 - 15.0 g/dL   HCT 69.6 (L) 29.5 - 28.4 %   MCV 86.7 80.0 - 100.0 fL   MCH 27.2 26.0 - 34.0 pg   MCHC 31.3 30.0 - 36.0 g/dL   RDW  13.2 44.0 - 10.2 %   Platelets 228 150 - 400 K/uL   nRBC 0.0 0.0 - 0.2 %    Comment: Performed at [facility], [address], [place], [place] [address]  Basic metabolic panel     Status: Abnormal   Collection Time: 05/15/23  1:13 AM  Result Value Ref Range   Sodium 136 135 - 145 mmol/L   Potassium 3.6 3.5 - 5.1 mmol/L   Chloride 101 98 - 111 mmol/L   CO2 25 22 - 32 mmol/L   Glucose, Bld 270 (H) 70 - 99 mg/dL    Comment: Glucose reference range applies only to samples taken after fasting for at least 8 hours.   BUN 16 8 - 23 mg/dL   Creatinine, Ser 6.44 (H) 0.44 - 1.00 mg/dL   Calcium 8.7 (L) 8.9 - 10.3 mg/dL   GFR, Estimated 36 (L) >60 mL/min    Comment: (NOTE) Calculated using the CKD-EPI Creatinine Equation (2021)    Anion gap 10 5 - 15    Comment: Performed at [facility], [address], [place], [place] [address]  Magnesium     Status: None   Collection Time: 05/15/23  1:13 AM  Result Value Ref Range   Magnesium 1.7 1.7 - 2.4 mg/dL    Comment: Performed at [facility], [address], [place], [place] [address]  Folate     Status: None   Collection Time: 05/15/23  1:13 AM  Result Value Ref Range   Folate 16.8 >5.9 ng/mL    Comment: Performed at [facility], [address], [place], [place] [address]  Glucose, capillary     Status: Abnormal   Collection Time: 05/15/23  6:10 AM  Result Value Ref Range   Glucose-Capillary 249 (H) 70 - 99 mg/dL    Comment: Glucose reference range applies only to samples taken after fasting for  at least 8 hours.  Glucose, capillary     Status: Abnormal   Collection Time: 05/15/23  7:28 AM  Result Value Ref Range   Glucose-Capillary 266 (H) 70 - 99 mg/dL    Comment: Glucose reference range applies only to samples taken after fasting for at least 8 hours.  Glucose, capillary     Status: Abnormal   Collection Time: 05/15/23 11:16 AM  Result Value Ref Range   Glucose-Capillary 320 (H) 70 - 99 mg/dL    Comment:  Glucose reference range applies only to samples taken after fasting for at least 8 hours.  Glucose, capillary     Status: Abnormal   Collection Time: 05/15/23  4:00 PM  Result Value Ref Range   Glucose-Capillary 347 (H) 70 - 99 mg/dL    Comment: Glucose reference range applies only to samples taken after fasting for at least 8 hours.  Glucose, capillary     Status: Abnormal   Collection Time: 05/15/23  8:34 PM  Result Value Ref Range   Glucose-Capillary 265 (H) 70 - 99 mg/dL    Comment: Glucose reference range applies only to samples taken after fasting for at least 8 hours.  CBC     Status: Abnormal   Collection Time: 05/16/23 12:44 AM  Result Value Ref Range   WBC 8.0 4.0 - 10.5 K/uL   RBC 3.21 (L) 3.87 - 5.11 MIL/uL   Hemoglobin 8.7 (L) 12.0 - 15.0 g/dL   HCT 16.1 (L) 09.6 - 04.5 %   MCV 87.9 80.0 - 100.0 fL   MCH 27.1 26.0 - 34.0 pg   MCHC 30.9 30.0 - 36.0 g/dL   RDW 40.9 81.1 - 91.4 %   Platelets 213 150 - 400 K/uL   nRBC 0.0 0.0 - 0.2 %    Comment: Performed at [facility], [address], [place], [place] [address]  Basic metabolic panel     Status: Abnormal   Collection Time: 05/16/23 12:44 AM  Result Value Ref Range   Sodium 134 (L) 135 - 145 mmol/L   Potassium 3.6 3.5 - 5.1 mmol/L   Chloride 103 98 - 111 mmol/L   CO2 23 22 - 32 mmol/L   Glucose, Bld 299 (H) 70 - 99 mg/dL    Comment: Glucose reference range applies only to samples taken after fasting for at least 8 hours.   BUN 19 8 - 23 mg/dL   Creatinine, Ser 6.21 (H) 0.44 - 1.00 mg/dL   Calcium 8.5 (L) 8.9 - 10.3 mg/dL   GFR, Estimated 35 (L) >60 mL/min    Comment: (NOTE) Calculated using the CKD-EPI Creatinine Equation (2021)    Anion gap 8 5 - 15    Comment: Performed at [facility], [address], [place], [place] [address]  Magnesium     Status: None   Collection Time: 05/16/23 12:44 AM  Result Value Ref Range   Magnesium 1.8 1.7 - 2.4 mg/dL    Comment: Performed at [facility], [address] [place], [place] [address]  Glucose, capillary     Status: Abnormal   Collection Time: 05/16/23  5:58 AM  Result Value Ref Range   Glucose-Capillary 293 (H) 70 - 99 mg/dL    Comment: Glucose reference range applies only to samples taken after fasting for at least 8 hours.   Comment 1 Notify RN    Comment 2 Document in Chart   Vancomycin, trough     Status:  Abnormal   Collection Time: 05/16/23 10:53 AM  Result Value Ref Range   Vancomycin Tr 12 (L) 15 - 20 ug/mL    Comment: Performed at [facility], [address] [place], [place] [address]  Glucose, capillary     Status: Abnormal   Collection Time: 05/16/23 11:26 AM  Result Value Ref Range   Glucose-Capillary 310 (H) 70 - 99 mg/dL    Comment: Glucose reference range applies only to samples taken after fasting for at least 8 hours.  Vancomycin, peak     Status: Abnormal   Collection Time: 05/16/23  1:59 PM  Result Value Ref Range   Vancomycin Pk 10 (L) 30 - 40 ug/mL    Comment: Performed at [facility], [address] [place], [place] [address]  Glucose, capillary     Status: Abnormal   Collection Time: 05/16/23  4:21 PM  Result Value Ref Range   Glucose-Capillary 195 (H) 70 - 99 mg/dL    Comment: Glucose reference range applies only to samples taken after fasting for at least 8 hours.  Glucose, capillary     Status: Abnormal   Collection Time: 05/16/23  9:05 PM  Result Value Ref Range   Glucose-Capillary 262 (H) 70 - 99 mg/dL    Comment: Glucose reference range applies only to samples taken after fasting for at least 8 hours.  Basic metabolic panel     Status: Abnormal   Collection Time: 05/17/23 12:40 AM  Result Value Ref Range   Sodium 137 135 - 145 mmol/L   Potassium 3.7 3.5 - 5.1 mmol/L   Chloride 109 98 - 111 mmol/L   CO2 20 (L) 22 - 32 mmol/L   Glucose, Bld 298 (H) 70 - 99 mg/dL    Comment: Glucose reference range applies only to samples taken after fasting for at least 8 hours.   BUN 17  8 - 23 mg/dL   Creatinine, Ser 0.98 (H) 0.44 - 1.00 mg/dL   Calcium 8.6 (L) 8.9 - 10.3 mg/dL   GFR, Estimated 42 (L) >60 mL/min    Comment: (NOTE) Calculated using the CKD-EPI Creatinine Equation (2021)    Anion gap 8 5 - 15    Comment: Performed at [facility], [address], [place], [place] [address]  Magnesium     Status: None   Collection Time: 05/17/23 12:40 AM  Result Value Ref Range   Magnesium 1.8 1.7 - 2.4 mg/dL    Comment: Performed at [facility], [address] [place], [place] [address]  CBC     Status: Abnormal   Collection Time: 05/17/23 12:40 AM  Result Value Ref Range   WBC 7.4 4.0 - 10.5 K/uL   RBC 3.17 (L) 3.87 - 5.11 MIL/uL   Hemoglobin 8.6 (L) 12.0 - 15.0 g/dL   HCT 62.1 (L) 30.8 - 65.7 %   MCV 88.3 80.0 - 100.0 fL   MCH 27.1 26.0 - 34.0 pg   MCHC 30.7 30.0 - 36.0 g/dL   RDW 84.6 96.2 - 95.2 %   Platelets 198 150 - 400 K/uL   nRBC 0.0 0.0 - 0.2 %    Comment: Performed at [facility], [address], [place], [place] [address]  Lipid panel     Status: Abnormal   Collection Time: 05/17/23 12:40 AM  Result Value Ref Range   Cholesterol 107 0 - 200 mg/dL   Triglycerides 440 <102 mg/dL   HDL 35 (  L) >40 mg/dL   Total CHOL/HDL Ratio 3.1 RATIO   VLDL 22 0 - 40 mg/dL   LDL Cholesterol 50 0 - 99 mg/dL    Comment:        Total Cholesterol/HDL:CHD Risk Coronary Heart Disease Risk Table                     Men   Women  1/2 Average Risk   3.4   3.3  Average Risk       5.0   4.4  2 X Average Risk   9.6   7.1  3 X Average Risk  23.4   11.0        Use the calculated Patient Ratio above and the CHD Risk Table to determine the patient's CHD Risk.        ATP III CLASSIFICATION (LDL):  <100     mg/dL   Optimal  161-096  mg/dL   Near or Above                    Optimal  130-159  mg/dL   Borderline  045-409  mg/dL   High  >811     mg/dL   Very High Performed at [facility], [address], [place], [place] [address]   Glucose,  capillary     Status: Abnormal   Collection Time: 05/17/23  6:27 AM  Result Value Ref Range   Glucose-Capillary 267 (H) 70 - 99 mg/dL    Comment: Glucose reference range applies only to samples taken after fasting for at least 8 hours.  Glucose, capillary     Status: Abnormal   Collection Time: 05/17/23 11:10 AM  Result Value Ref Range   Glucose-Capillary 306 (H) 70 - 99 mg/dL    Comment: Glucose reference range applies only to samples taken after fasting for at least 8 hours.  Glucose, capillary     Status: Abnormal   Collection Time: 05/17/23  4:30 PM  Result Value Ref Range   Glucose-Capillary 277 (H) 70 - 99 mg/dL    Comment: Glucose reference range applies only to samples taken after fasting for at least 8 hours.   Comment 1 Notify RN   Glucose, capillary     Status: Abnormal   Collection Time: 05/17/23  9:21 PM  Result Value Ref Range   Glucose-Capillary 218 (H) 70 - 99 mg/dL    Comment: Glucose reference range applies only to samples taken after fasting for at least 8 hours.   Comment 1 Notify RN    Comment 2 Document in Chart   Basic metabolic panel     Status: Abnormal   Collection Time: 05/18/23  1:23 AM  Result Value Ref Range   Sodium 138 135 - 145 mmol/L   Potassium 3.6 3.5 - 5.1 mmol/L   Chloride 109 98 - 111 mmol/L   CO2 23 22 - 32 mmol/L   Glucose, Bld 202 (H) 70 - 99 mg/dL    Comment: Glucose reference range applies only to samples taken after fasting for at least 8 hours.   BUN 15 8 - 23 mg/dL   Creatinine, Ser 2.95 (H) 0.44 - 1.00 mg/dL   Calcium 8.8 (L) 8.9 - 10.3 mg/dL   GFR, Estimated 43 (L) >60 mL/min    Comment: (NOTE) Calculated using the CKD-EPI Creatinine Equation (2021)    Anion gap 6 5 - 15    Comment: Performed at [facility], [address] [place], [place] [address]  Magnesium     Status: None   Collection Time: 05/18/23  1:23 AM  Result Value Ref Range   Magnesium 1.9 1.7 - 2.4 mg/dL    Comment: Performed at [facility], [address] [place], [place] [address]  CBC     Status: Abnormal   Collection Time: 05/18/23  1:23 AM  Result Value Ref Range   WBC 7.9 4.0 - 10.5 K/uL   RBC 3.25 (L) 3.87 - 5.11 MIL/uL   Hemoglobin 8.8 (L) 12.0 - 15.0 g/dL   HCT 82.9 (L) 56.2 - 13.0 %   MCV 87.7 80.0 - 100.0 fL   MCH 27.1 26.0 - 34.0 pg   MCHC 30.9 30.0 - 36.0 g/dL   RDW 86.5 (H) 78.4 - 69.6 %   Platelets 211 150 - 400 K/uL   nRBC 0.0 0.0 - 0.2 %    Comment: Performed at [facility], [address] [place], [place] [address]  Glucose, capillary     Status: Abnormal   Collection Time: 05/18/23  6:10 AM  Result Value Ref Range   Glucose-Capillary 212 (H) 70 - 99 mg/dL    Comment: Glucose reference range applies only to samples taken after fasting for at least 8 hours.   Comment 1 Notify RN    Comment 2 Document in Chart   Surgical pcr screen     Status: Abnormal   Collection Time: 05/18/23  6:23 AM   Specimen: Nasal Mucosa; Nasal Swab  Result Value Ref Range   MRSA, PCR NEGATIVE NEGATIVE   Staphylococcus aureus POSITIVE (A) NEGATIVE    Comment: (NOTE) The Xpert SA Assay (FDA approved for NASAL specimens in patients 14 years of age and older), is one component of a comprehensive surveillance program. It is not intended to diagnose infection nor to guide or monitor treatment. Performed at [facility], [address] [place], [place] [address]   Glucose, capillary     Status: Abnormal   Collection Time: 05/18/23 10:59 AM  Result Value Ref Range   Glucose-Capillary 200 (H) 70 - 99 mg/dL    Comment: Glucose reference range applies only to samples taken after fasting for at least 8 hours.  Glucose, capillary     Status: Abnormal   Collection Time: 05/18/23 12:31 PM  Result Value Ref Range   Glucose-Capillary 200 (H) 70 - 99 mg/dL    Comment: Glucose reference range applies only to samples taken after fasting for at least 8 hours.  Glucose, capillary     Status: Abnormal   Collection Time:  05/18/23  1:31 PM  Result Value Ref Range   Glucose-Capillary 200 (H) 70 - 99 mg/dL    Comment: Glucose reference range applies only to samples taken after fasting for at least 8 hours.  Glucose, capillary     Status: Abnormal   Collection Time: 05/18/23  2:22 PM  Result Value Ref Range   Glucose-Capillary 171 (H) 70 - 99 mg/dL    Comment: Glucose reference range applies only to samples taken after fasting for at least 8 hours.   Comment 1 Notify RN   Glucose, capillary     Status: Abnormal   Collection Time: 05/18/23  4:24 PM  Result Value Ref Range   Glucose-Capillary 179 (H) 70 - 99 mg/dL    Comment: Glucose reference range applies only to samples taken after fasting for at least 8 hours.   Comment 1  Notify RN   Glucose, capillary     Status: Abnormal   Collection Time: 05/18/23  9:04 PM  Result Value Ref Range   Glucose-Capillary 236 (H) 70 - 99 mg/dL    Comment: Glucose reference range applies only to samples taken after fasting for at least 8 hours.   Comment 1 Notify RN    Comment 2 Document in Chart   Basic metabolic panel     Status: Abnormal   Collection Time: 05/19/23  1:00 AM  Result Value Ref Range   Sodium 138 135 - 145 mmol/L   Potassium 4.1 3.5 - 5.1 mmol/L   Chloride 109 98 - 111 mmol/L   CO2 22 22 - 32 mmol/L   Glucose, Bld 271 (H) 70 - 99 mg/dL    Comment: Glucose reference range applies only to samples taken after fasting for at least 8 hours.   BUN 13 8 - 23 mg/dL   Creatinine, Ser 4.09 (H) 0.44 - 1.00 mg/dL   Calcium 8.9 8.9 - 81.1 mg/dL   GFR, Estimated 48 (L) >60 mL/min    Comment: (NOTE) Calculated using the CKD-EPI Creatinine Equation (2021)    Anion gap 7 5 - 15    Comment: Performed at [facility], [address], [place], [place] [address]  Magnesium     Status: None   Collection Time: 05/19/23  1:00 AM  Result Value Ref Range   Magnesium 1.9 1.7 - 2.4 mg/dL    Comment: Performed at [facility], [address], [place],  [place] [address]  CBC     Status: Abnormal   Collection Time: 05/19/23  1:00 AM  Result Value Ref Range   WBC 9.3 4.0 - 10.5 K/uL   RBC 3.34 (L) 3.87 - 5.11 MIL/uL   Hemoglobin 8.9 (L) 12.0 - 15.0 g/dL   HCT 13.0 (L) 86.5 - 78.4 %   MCV 86.2 80.0 - 100.0 fL   MCH 26.6 26.0 - 34.0 pg   MCHC 30.9 30.0 - 36.0 g/dL   RDW 69.6 (H) 29.5 - 28.4 %   Platelets 226 150 - 400 K/uL   nRBC 0.0 0.0 - 0.2 %    Comment: Performed at [facility], [address] [place], [place] [address]  Glucose, capillary     Status: Abnormal   Collection Time: 05/19/23  6:39 AM  Result Value Ref Range   Glucose-Capillary 234 (H) 70 - 99 mg/dL    Comment: Glucose reference range applies only to samples taken after fasting for at least 8 hours.   Comment 1 Notify RN    Comment 2 Document in Chart   Glucose, capillary     Status: Abnormal   Collection Time: 05/19/23 11:11 AM  Result Value Ref Range   Glucose-Capillary 237 (H) 70 - 99 mg/dL    Comment: Glucose reference range applies only to samples taken after fasting for at least 8 hours.  Glucose, capillary     Status: Abnormal   Collection Time: 05/19/23  3:40 PM  Result Value Ref Range   Glucose-Capillary 240 (H) 70 - 99 mg/dL    Comment: Glucose reference range applies only to samples taken after fasting for at least 8 hours.  Glucose, capillary     Status: Abnormal   Collection Time: 05/19/23  9:47 PM  Result Value Ref Range   Glucose-Capillary 223 (H) 70 - 99 mg/dL    Comment: Glucose reference range applies only to samples taken  after fasting for at least 8 hours.   Comment 1 Notify RN    Comment 2 Document in Chart   Basic metabolic panel     Status: Abnormal   Collection Time: 05/20/23  1:23 AM  Result Value Ref Range   Sodium 134 (L) 135 - 145 mmol/L   Potassium 3.8 3.5 - 5.1 mmol/L   Chloride 106 98 - 111 mmol/L   CO2 21 (L) 22 - 32 mmol/L   Glucose, Bld 202 (H) 70 - 99 mg/dL    Comment: Glucose reference range applies only to samples taken  after fasting for at least 8 hours.   BUN 13 8 - 23 mg/dL   Creatinine, Ser 5.36 (H) 0.44 - 1.00 mg/dL   Calcium 8.9 8.9 - 64.4 mg/dL   GFR, Estimated 50 (L) >60 mL/min    Comment: (NOTE) Calculated using the CKD-EPI Creatinine Equation (2021)    Anion gap 7 5 - 15    Comment: Performed at [facility], [address] [place], [place] [address]  Magnesium     Status: None   Collection Time: 05/20/23  1:23 AM  Result Value Ref Range   Magnesium 1.9 1.7 - 2.4 mg/dL    Comment: Performed at [facility], [address] [place], [place] [address]  CBC     Status: Abnormal   Collection Time: 05/20/23  1:23 AM  Result Value Ref Range   WBC 9.3 4.0 - 10.5 K/uL   RBC 3.34 (L) 3.87 - 5.11 MIL/uL   Hemoglobin 9.1 (L) 12.0 - 15.0 g/dL   HCT 38.7 (L) 56.4 - 33.2 %   MCV 87.7 80.0 - 100.0 fL   MCH 27.2 26.0 - 34.0 pg   MCHC 31.1 30.0 - 36.0 g/dL   RDW 95.1 (H) 88.4 - 16.6 %   Platelets 236 150 - 400 K/uL   nRBC 0.0 0.0 - 0.2 %    Comment: Performed at [facility], [address], [place], [place] [address]  Glucose, capillary     Status: Abnormal   Collection Time: 05/20/23  6:19 AM  Result Value Ref Range   Glucose-Capillary 142 (H) 70 - 99 mg/dL    Comment: Glucose reference range applies only to samples taken after fasting for at least 8 hours.   Comment 1 Notify RN    Comment 2 Document in Chart   Glucose, capillary     Status: Abnormal   Collection Time: 05/20/23 10:54 AM  Result Value Ref Range   Glucose-Capillary 178 (H) 70 - 99 mg/dL    Comment: Glucose reference range applies only to samples taken after fasting for at least 8 hours.  Glucose, capillary     Status: Abnormal   Collection Time: 05/20/23  4:52 PM  Result Value Ref Range   Glucose-Capillary 206 (H) 70 - 99 mg/dL    Comment: Glucose reference range applies only to samples taken after fasting for at least 8 hours.  Glucose, capillary     Status: Abnormal   Collection Time: 05/20/23  8:55 PM   Result Value Ref Range   Glucose-Capillary 182 (H) 70 - 99 mg/dL    Comment: Glucose reference range applies only to samples taken after fasting for at least 8 hours.  Basic metabolic panel     Status: Abnormal   Collection Time: 05/21/23  1:23 AM  Result Value Ref Range   Sodium 139 135 - 145 mmol/L   Potassium 4.2 3.5 -  5.1 mmol/L   Chloride 105 98 - 111 mmol/L   CO2 23 22 - 32 mmol/L   Glucose, Bld 193 (H) 70 - 99 mg/dL    Comment: Glucose reference range applies only to samples taken after fasting for at least 8 hours.   BUN 13 8 - 23 mg/dL   Creatinine, Ser 0.86 (H) 0.44 - 1.00 mg/dL   Calcium 9.5 8.9 - 57.8 mg/dL   GFR, Estimated 55 (L) >60 mL/min    Comment: (NOTE) Calculated using the CKD-EPI Creatinine Equation (2021)    Anion gap 11 5 - 15    Comment: Performed at [facility], [address] [place], [place] [address]  Magnesium     Status: None   Collection Time: 05/21/23  1:23 AM  Result Value Ref Range   Magnesium 2.1 1.7 - 2.4 mg/dL    Comment: Performed at [facility], [address], [place], [place] [address]  CBC     Status: Abnormal   Collection Time: 05/21/23  1:23 AM  Result Value Ref Range   WBC 8.3 4.0 - 10.5 K/uL   RBC 3.55 (L) 3.87 - 5.11 MIL/uL   Hemoglobin 9.7 (L) 12.0 - 15.0 g/dL   HCT 13.2 (L) 44.0 - 10.2 %   MCV 87.0 80.0 - 100.0 fL   MCH 27.3 26.0 - 34.0 pg   MCHC 31.4 30.0 - 36.0 g/dL   RDW 72.5 (H) 36.6 - 44.0 %   Platelets 277 150 - 400 K/uL   nRBC 0.0 0.0 - 0.2 %    Comment: Performed at [facility], [address], [place], [place] [address]  Glucose, capillary     Status: Abnormal   Collection Time: 05/21/23  5:50 AM  Result Value Ref Range   Glucose-Capillary 181 (H) 70 - 99 mg/dL    Comment: Glucose reference range applies only to samples taken after fasting for at least 8 hours.  Glucose, capillary     Status: Abnormal   Collection Time: 05/21/23 11:26 AM  Result Value Ref Range   Glucose-Capillary 198 (H)  70 - 99 mg/dL    Comment: Glucose reference range applies only to samples taken after fasting for at least 8 hours.  VAS [place] ABI WITH/WO TBI     Status: None   Collection Time: 06/01/23  7:59 AM  Result Value Ref Range   Right ABI 0.63    Left ABI 0.96         Garner Nash, MD, MS

## 2023-06-11 NOTE — Patient Instructions (Addendum)
Discharge, take Diflucan as prescribed.  Please hold simvastatin today that you take the Diflucan due to drug interactions.  Office will call to help schedule home health visits.

## 2023-06-11 NOTE — Assessment & Plan Note (Signed)
Recent right big toe amputation for osteomyelitis, right arterial stenting for blood flow.  Plan: Continue follow up with orthopedics and vascular specialists. Ensure home health services for wound care. Adjust home health services for more frequent bandage changes.

## 2023-06-11 NOTE — Assessment & Plan Note (Signed)
Elevated blood glucose levels with last A1c at 12.2.  Plan: Continue monitoring blood glucose levels. Follow up with endocrinologist on the 13th. Encourage adherence to diabetes management plan.

## 2023-06-11 NOTE — Assessment & Plan Note (Signed)
Persistent vaginal discharge and itching, previously identified as yeast.  Plan: Administer Diflucan 150 mg tablet today and repeat in 3 days. Conduct a repeat vaginal swab for bacterial vaginosis and yeast today. Reassess symptoms and swab results in a week or adjust treatment accordingly. Declined STD testing.

## 2023-06-11 NOTE — Telephone Encounter (Signed)
  CLINICAL USE BELOW THIS LINE (use X to signify action taken)  ___ Form received and placed in providers office for signature. __x_ Form completed and faxed to LOA Dept.  ___ Form completed & LVM to notify patient ready for pick up.  __x_ Charge sheet and copy of form in front office folder for office supervisor.

## 2023-06-11 NOTE — Assessment & Plan Note (Signed)
>>  ASSESSMENT AND PLAN FOR TYPE 2 DIABETES MELLITUS WITH STAGE 3B CHRONIC KIDNEY DISEASE, WITH LONG-TERM CURRENT USE OF INSULIN  (HCC) WRITTEN ON 06/11/2023  4:40 PM BY Lounell Schumacher B, MD  Elevated blood glucose levels with last A1c at 12.2.  Plan: Continue monitoring blood glucose levels. Follow up with endocrinologist on the 13th. Encourage adherence to diabetes management plan.

## 2023-06-12 ENCOUNTER — Telehealth: Payer: Self-pay | Admitting: Family Medicine

## 2023-06-12 NOTE — Telephone Encounter (Signed)
06/12/23 Danielle Schroeder from [facility] care called with the following information for Danielle Schroeder: Her blood sugar value was 267 (with fasting), additionally, B/P is 140/90. As of today, no symptoms yet except for occasional headache. He is requesting for verbal order for Home Health nurse for disease and medication management. He also wants clarification on a new medication she was prescribed yesterday fluconazole (DIFLUCAN) 150 MG tablet which she is yet to pick up,but plan to. He wants clarification when she is to take each medication.  He is requesting a call back on [phone].

## 2023-06-12 NOTE — Telephone Encounter (Signed)
Ardelle Balls of annotation below and he verbalized understanding

## 2023-06-12 NOTE — Telephone Encounter (Signed)
Please advise in Dr. Biagio Borg absence.

## 2023-06-14 ENCOUNTER — Telehealth: Payer: Self-pay

## 2023-06-14 ENCOUNTER — Telehealth: Payer: Self-pay | Admitting: Family Medicine

## 2023-06-14 NOTE — Telephone Encounter (Signed)
06/14/23 - April from [facility] health called asking Patsy Lager to give her a call back on [phone] to talk about pt's health.

## 2023-06-14 NOTE — Telephone Encounter (Signed)
April, RN with [facility] called after seeing pt for the first time today. Pt has not changed her toe amputation dressing since she was here on 06/01/23. Gave April VO to clean and redress the amputation. April will fax over the rest of the orders today.

## 2023-06-14 NOTE — Telephone Encounter (Signed)
Spoke with Danielle Schroeder at home health and she would like verbal orders to see Janney weekly for 5 weeks. Please advise in Dr. Carollee Massed absence.

## 2023-06-15 NOTE — Chronic Care Management (AMB) (Signed)
  Chronic Care Management   CCM RN Visit Note   Name: Danielle Schroeder MRN: [id number] DOB: 1956/06/12  Subjective: Danielle Schroeder is a 67 y.o. year old female who is a primary care patient of [name], MD. The patient was referred to the Chronic Care Management team for assistance with care management needs subsequent to provider initiation of CCM services and plan of care.

## 2023-06-15 NOTE — Chronic Care Management (AMB) (Signed)
  Chronic Care Management   CCM RN Visit Note   Name: ANITA LAGUNA MRN: [id number] DOB: 1956/06/12  Subjective: YAMELI DELAMATER is a 67 y.o. year old female who is a primary care patient of [name], MD. The patient was referred to the Chronic Care Management team for assistance with care management needs subsequent to provider initiation of CCM services and plan of care.

## 2023-06-15 NOTE — Telephone Encounter (Signed)
Left April a detailed voice message of annotation below and to return call to office with any questions.

## 2023-06-18 ENCOUNTER — Telehealth: Payer: Self-pay | Admitting: Family Medicine

## 2023-06-18 NOTE — Telephone Encounter (Signed)
Message was an error. Paper work was faxed yesterday and copy was provided to [facility] for scan.

## 2023-06-18 NOTE — Telephone Encounter (Signed)
Patient dropped off document Home Health Certificate (Order ID  ), to be filled out by provider. Patient requested to send it back via Fax within 5-days. Document is located in providers tray at front office.Please advise at Mobile [phone] (mobile)  Home health cert. By fax , I put in dr box

## 2023-06-19 ENCOUNTER — Telehealth: Payer: Self-pay | Admitting: Family Medicine

## 2023-06-19 ENCOUNTER — Telehealth: Payer: Self-pay

## 2023-06-19 DIAGNOSIS — T8131XA Disruption of external operation (surgical) wound, not elsewhere classified, initial encounter: Secondary | ICD-10-CM | POA: Diagnosis not present

## 2023-06-19 DIAGNOSIS — S91101A Unspecified open wound of right great toe without damage to nail, initial encounter: Secondary | ICD-10-CM | POA: Diagnosis not present

## 2023-06-19 NOTE — Telephone Encounter (Signed)
Contacted Jim, but he left the pt's home. I contacted the pt and advised her of annotation below she verbalized understanding. Rosanne Ashing inquired about verbal orders. Please advise, see below for orders.

## 2023-06-19 NOTE — Telephone Encounter (Signed)
Increase insulin glargine by 5 units from 45 to 50. Continue to monitor cbg and follow up with Endocrinology on 06/26/23

## 2023-06-19 NOTE — Telephone Encounter (Signed)
S/w April RN [facility], stated she would only be able to do dressing changes once a week due to it only being a dry dressing. She advised that pt's R heel wound has been draining and appeared "a little macerated", she recommended applying an alginate dressing to that area and then would be able to increase visits to twice weekly. Gave verbal order and advised that pt would be evaluated on Friday and if orders needed to be changed they would be notified. April gave verbal understanding and agreed with this plan.

## 2023-06-19 NOTE — Telephone Encounter (Signed)
Ardelle Balls of annotation below. He verbalized understanding.

## 2023-06-19 NOTE — Telephone Encounter (Signed)
HH ORDERS   Caller Name: [facility] Agency Name: [facility] Callback Phone #: [phone]  Service Requested: PT Frequency of Visits: 1 additional PT  Report her blood sugar is elevated fasting it is 219 today.

## 2023-06-21 ENCOUNTER — Ambulatory Visit: Payer: Medicare HMO | Admitting: Family Medicine

## 2023-06-21 DIAGNOSIS — T8131XA Disruption of external operation (surgical) wound, not elsewhere classified, initial encounter: Secondary | ICD-10-CM | POA: Diagnosis not present

## 2023-06-21 DIAGNOSIS — L97519 Non-pressure chronic ulcer of other part of right foot with unspecified severity: Secondary | ICD-10-CM | POA: Diagnosis not present

## 2023-06-22 ENCOUNTER — Ambulatory Visit (INDEPENDENT_AMBULATORY_CARE_PROVIDER_SITE_OTHER): Payer: Medicare HMO | Admitting: Physician Assistant

## 2023-06-22 VITALS — BP 121/71 | HR 82 | Temp 97.9°F | Resp 18 | Ht 66.0 in | Wt 219.3 lb

## 2023-06-22 DIAGNOSIS — I872 Venous insufficiency (chronic) (peripheral): Secondary | ICD-10-CM

## 2023-06-22 DIAGNOSIS — Z89411 Acquired absence of right great toe: Secondary | ICD-10-CM

## 2023-06-22 NOTE — Progress Notes (Signed)
POST OPERATIVE OFFICE NOTE    CC:  F/u for surgery  HPI:  Danielle Schroeder is a 67 y.o. female who is s/p right great toe amputation on 05/18/2023 by Dr. Randie Heinz.  She has a history of right posterior tibial and peroneal artery balloon angioplasty on 05/16/2023 by Dr. Karin Lieu.  She also underwent left SFA stenting on 04/11/2023 by Dr. Karin Lieu.  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  Her first follow-up with our office was on 7/19 and at that time she was healing well.  She continued to struggle with bilateral lower extremity edema, but did not have any new wounds.  She returns today for repeat incision check and suture removal.  She denies any new wounds or rest pain.  She has been having help from home health for dry dressing changes to the right great toe amputation site.  She denies any erythema, drainage, fever, or chills.   No Known Allergies  Current Outpatient Medications  Medication Sig Dispense Refill   acetaminophen (TYLENOL) 325 MG tablet Take 2 tablets (650 mg total) by mouth every 4 (four) hours as needed for headache or mild pain. 30 tablet 0   amLODipine (NORVASC) 5 MG tablet Take 1 tablet (5 mg total) by mouth daily. 30 tablet 0   aspirin EC 81 MG tablet Take 81 mg by mouth in the morning.     carvedilol (COREG) 25 MG tablet Take 25 mg by mouth 2 (two) times daily with a meal.     fluconazole (DIFLUCAN) 150 MG tablet Take 1 tablet (150 mg total) by mouth every three (3) days as needed. 2 tablet 0   furosemide (LASIX) 40 MG tablet TAKE 1 TABLET BY MOUTH TWICE A DAY 180 tablet 3   glucose blood test strip 1 each by Other route 3 (three) times daily. Use as instructed     insulin aspart (NOVOLOG FLEXPEN) 100 UNIT/ML FlexPen Max daily 45 units (Patient taking differently: Inject 0-6 Units into the skin 3 (three) times daily before meals. Max daily 45 units) 45 mL 3   Insulin Glargine (BASAGLAR KWIKPEN) 100 UNIT/ML Inject 40 Units into the skin  daily. 45 mL 3   Insulin Pen Needle (PEN NEEDLES) 31G X 5 MM MISC 1 each by Does not apply route in the morning, at noon, in the evening, and at bedtime. 400 each 3   levothyroxine (SYNTHROID) 88 MCG tablet Take 1 tablet (88 mcg total) by mouth daily. 90 tablet 3   potassium chloride (KLOR-CON) 10 MEQ tablet Take 1 tablet (10 mEq total) by mouth daily. 90 tablet 3   simvastatin (ZOCOR) 20 MG tablet Take 20 mg by mouth every evening.     SYRINGE-NEEDLE, DISP, 3 ML 25G X 1" 3 ML MISC Inject 1 ml into the skin 3 times daily. 100 each 11   Vitamin D, Cholecalciferol, 25 MCG (1000 UT) CAPS Take 2 capsules by mouth daily. (Patient taking differently: Take 2 capsules by mouth daily with lunch.) 60 capsule 0   No current facility-administered medications for this visit.     ROS:  See HPI  Physical Exam:  Incision: Right great toe amputation site well-healed, all sutures removed.  No erythema or necrosis Extremities: Bilateral lower extremities edematous.  No new wounds    Assessment/Plan:  This is a 67 y.o. female who is s/p: right GT amputation on 05/18/2023  -Her right great toe amputation site is well-healed.  There are no signs of infection such  as drainage, erythema, or fevers.  All sutures were removed from her amputation site without issue. -Her right foot was redressed with a kerlex and Ace bandage.  This can stay on for 3 days and then be removed.  She will no longer require regular dressing changes to her right foot. -She has no rest pain or new tissue loss of the lower extremities.  She continues to have bilateral lower extremity swellings, which she tries to control with compression bandaging and elevation. -She can follow-up with our office in 6 months with bilateral lower extremity arterial duplex and ABIs -She is cleared from a vascular standpoint to return to podiatry for regular footcare   Loel Dubonnet, PA-C [facility] [phone]   Clinic MD:  Karin Lieu

## 2023-06-26 ENCOUNTER — Ambulatory Visit: Payer: Medicare HMO | Admitting: Internal Medicine

## 2023-06-26 ENCOUNTER — Telehealth: Payer: Self-pay

## 2023-06-26 ENCOUNTER — Encounter: Payer: Self-pay | Admitting: Internal Medicine

## 2023-06-26 ENCOUNTER — Telehealth: Payer: Self-pay | Admitting: Family Medicine

## 2023-06-26 VITALS — BP 138/84 | HR 90 | Ht 66.0 in | Wt 217.4 lb

## 2023-06-26 DIAGNOSIS — N1832 Chronic kidney disease, stage 3b: Secondary | ICD-10-CM

## 2023-06-26 DIAGNOSIS — E220 Acromegaly and pituitary gigantism: Secondary | ICD-10-CM | POA: Diagnosis not present

## 2023-06-26 DIAGNOSIS — E1122 Type 2 diabetes mellitus with diabetic chronic kidney disease: Secondary | ICD-10-CM | POA: Diagnosis not present

## 2023-06-26 DIAGNOSIS — D352 Benign neoplasm of pituitary gland: Secondary | ICD-10-CM | POA: Diagnosis not present

## 2023-06-26 DIAGNOSIS — E039 Hypothyroidism, unspecified: Secondary | ICD-10-CM

## 2023-06-26 DIAGNOSIS — E1165 Type 2 diabetes mellitus with hyperglycemia: Secondary | ICD-10-CM | POA: Diagnosis not present

## 2023-06-26 DIAGNOSIS — E042 Nontoxic multinodular goiter: Secondary | ICD-10-CM

## 2023-06-26 DIAGNOSIS — Z794 Long term (current) use of insulin: Secondary | ICD-10-CM

## 2023-06-26 LAB — POCT GLUCOSE (DEVICE FOR HOME USE): POC Glucose: 162 mg/dl — AB (ref 70–99)

## 2023-06-26 MED ORDER — BASAGLAR KWIKPEN 100 UNIT/ML ~~LOC~~ SOPN: 54.0000 [IU] | PEN_INJECTOR | Freq: Every day | SUBCUTANEOUS | 3 refills | Status: DC

## 2023-06-26 MED ORDER — LEVOTHYROXINE SODIUM 88 MCG PO TABS: 88.0000 ug | ORAL_TABLET | Freq: Every day | ORAL | 3 refills | Status: DC

## 2023-06-26 MED ORDER — PEN NEEDLES 31G X 5 MM MISC: 1.0000 | Freq: Four times a day (QID) | 3 refills | Status: DC

## 2023-06-26 MED ORDER — OCTREOTIDE ACETATE 100 MCG/ML IJ SOLN: 100.0000 ug | Freq: Three times a day (TID) | INTRAMUSCULAR | 3 refills | Status: DC

## 2023-06-26 MED ORDER — CABERGOLINE 0.5 MG PO TABS: 0.2500 mg | ORAL_TABLET | ORAL | 3 refills | Status: DC

## 2023-06-26 MED ORDER — "SYRINGE/NEEDLE (DISP) 25G X 1"" 3 ML MISC": 11 refills | Status: DC

## 2023-06-26 NOTE — Progress Notes (Signed)
Name: Danielle Schroeder  MRN/ DOB: [id number], 1956-01-17    Age/ Sex: 67 y.o., female     PCP: [name], MD   Reason for Endocrinology Evaluation: Pituitary adenoma      Initial Endocrinology Clinic Visit: 10/06/2019    PATIENT IDENTIFIER: Danielle Schroeder is a 67 y.o., female with a past medical history of T2DM, dyslipidemia, pituitary adenoma, acromegaly, central hypothyroidism. She has followed with [facility] since 10/06/2019 for consultative assistance with management of her pituitary adenoma.     HISTORICAL SUMMARY:  Per patient she believes she had pituitary adenoma for >40 yrs. she does endorse history of subcutaneous injections in the past which sound like octreotide.  She was seen by Dr. Johnsie Cancel in 2020 for evaluation of pituitary adenoma. 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 She was deemed not a surgical candidate due to high risk for complication She is S/P radiation treatment in 2021 , not a candidate for surgical intervention    DM HISTORY : She was diagnosed with DM since the age of 49 units. She has been on insulin for years. Her A1c ranges from 8.2 % in 2023 to 8.8% in 2022      THYROID HISTORY: She has been diagnosed with thyroid disease in 2010 and has been on LT-for replacement She was also diagnosed with multinodular goiter in December 2020 based on thyroid ultrasound, no history of FNA   No FH of thyroid disease  Has Fh of DM  Denies FH of pituitary disease   SUBJECTIVE:   Today (06/26/2023):  [name] is here for Acromegaly , hypothyroidism and T2DM.  She checks glucose once daily , did not bring meter again today    She was recently admitted for osteomyelitis of the right lower extremity, she is s/p right great toe amputation  Denies nausea or vomiting  Denies  constipation or diarrhea  Denies diarrhea or loose stools  Denies local neck symptoms  She follows with Nephrology Dr. Glenna Fellows    HOME ENDOCRINE MEDICATION  Octreotide 100 mcg TID- not taking  Cabergoline 0.5 mg, 1 tab twice WEEKLY- not taking  Levothyroxine 88 mcg daily  Basaglar 50  units daily  Novolog 6 units TIDQAC- takes 4 units  correction factor : (BG -130/30) 3 times daily before every meal     DIABETIC COMPLICATIONS: Microvascular complications:  S/P right toe amputation 05/2023, CKD Denies:  Last Eye Exam: Completed   Macrovascular complications:  PVD Denies: CAD, CVA    HISTORY:  Past Medical History:  Past Medical History:  Diagnosis Date   Cataract    bilateral lens implants   CHF (congestive heart failure) (HCC)    Diabetes mellitus without complication (HCC)    Hyperlipidemia    Hypertension    Pituitary adenoma with extrasellar extension (HCC)    Sleep apnea    no c-pap   Thyroid disease    hypothyroidism   Past Surgical History:  Past Surgical History:  Procedure Laterality Date   ABDOMINAL AORTOGRAM W/LOWER EXTREMITY Left 04/11/2023   Procedure: ABDOMINAL AORTOGRAM W/LOWER EXTREMITY;  Surgeon: Victorino Sparrow, MD;  [facility] INVASIVE CV LAB;  Service: Cardiovascular;  Laterality: Left;   ABDOMINAL AORTOGRAM W/LOWER EXTREMITY N/A 05/16/2023   Procedure: ABDOMINAL AORTOGRAM W/LOWER EXTREMITY;  Surgeon: Victorino Sparrow, MD;  Location: [facility] INVASIVE CV LAB;  Service: Cardiovascular;  Laterality: N/A;   AMPUTATION Right 05/18/2023   Procedure: RIGHT GREAT TOE AMPUTATION;  Surgeon: Maeola Harman, MD;  Location: [facility] OR;  Service: Vascular;  Laterality: Right;   BREAST SURGERY     CHOLECYSTECTOMY     PERIPHERAL VASCULAR BALLOON ANGIOPLASTY Left 04/11/2023   Procedure: PERIPHERAL VASCULAR BALLOON ANGIOPLASTY;  Surgeon: Victorino Sparrow, MD;  Location: [facility] INVASIVE CV LAB;  Service: Cardiovascular;  Laterality: Left;  Lt AT   PERIPHERAL  VASCULAR INTERVENTION Left 04/11/2023   Procedure: PERIPHERAL VASCULAR INTERVENTION;  Surgeon: Victorino Sparrow, MD;  Location: [facility] INVASIVE CV LAB;  Service: Cardiovascular;  Laterality: Left;  Lt SFA   PERIPHERAL VASCULAR INTERVENTION  05/16/2023   Procedure: PERIPHERAL VASCULAR INTERVENTION;  Surgeon: Victorino Sparrow, MD;  Location: [facility] INVASIVE CV LAB;  Service: Cardiovascular;;   TONSILLECTOMY     TUBAL LIGATION     Social History:  reports that she has never smoked. She has never used smokeless tobacco. She reports that she does not drink alcohol and does not use drugs. Family History:  Family History  Problem Relation Age of Onset   Thyroid disease Neg Hx    Colon cancer Neg Hx    Esophageal cancer Neg Hx    Rectal cancer Neg Hx    Stomach cancer Neg Hx      HOME MEDICATIONS: Allergies as of 06/26/2023   No Known Allergies      Medication List        Accurate as of June 26, 2023  8:50 AM. If you have any questions, ask your nurse or doctor.          acetaminophen 325 MG tablet Commonly known as: TYLENOL Take 2 tablets (650 mg total) by mouth every 4 (four) hours as needed for headache or mild pain.   amLODipine 5 MG tablet Commonly known as: NORVASC Take 1 tablet (5 mg total) by mouth daily.   aspirin EC 81 MG tablet Take 81 mg by mouth in the morning.   Basaglar KwikPen 100 UNIT/ML Inject 40 Units into the skin daily.   carvedilol 25 MG tablet Commonly known as: COREG Take 25 mg by mouth 2 (two) times daily with a meal.   fluconazole 150 MG tablet Commonly known as: DIFLUCAN Take 1 tablet (150 mg total) by mouth every three (3) days as needed.   furosemide 40 MG tablet Commonly known as: LASIX TAKE 1 TABLET BY MOUTH TWICE A DAY   glucose blood test strip 1 each by Other route 3 (three) times daily. Use as instructed   levothyroxine 88 MCG tablet Commonly known as: SYNTHROID Take 1 tablet (88 mcg total) by mouth daily.   NovoLOG FlexPen 100  UNIT/ML FlexPen Generic drug: insulin aspart Max daily 45 units What changed:  how much to take how to take this when to take this   Pen Needles 31G X 5 MM Misc 1 each by Does not apply route in the morning, at noon, in the evening, and at bedtime.   potassium chloride 10 MEQ tablet Commonly known as: KLOR-CON Take 1 tablet (10 mEq total) by mouth daily.   simvastatin 20 MG tablet Commonly known as: ZOCOR Take 20 mg by mouth every evening.   SYRINGE-NEEDLE (DISP) 3 ML 25G X 1" 3 ML Misc Inject 1 ml into the skin 3 times daily.   Vitamin D (Cholecalciferol) 25 MCG (1000 UT) Caps Take 2 capsules by mouth daily. What changed: when to take this  OBJECTIVE:   PHYSICAL EXAM: VS: There were no vitals taken for this visit.   EXAM: General: Pt appears well and is in NAD  Neck: General: Supple without adenopathy. Thyroid: Thyroid size normal.  No goiter or nodules appreciated.   Lungs: Clear with good BS bilat   Heart: Auscultation: RRR.  Abdomen:  soft, nontender  Extremities:  BL LE: Stasis dermatitis with trace edema B/L   Mental Status: Judgment, insight: Intact Orientation: Oriented to time, place, and person Mood and affect: No depression, anxiety, or agitation   DM Foot exam 01/16/2023 The skin of the feet hows deep fissures at the anterior ankles with swelling, erythema and yellowish discharge on the right  The pedal pulses are feeble  The sensation is absent  to a screening 5.07, 10 gram monofilament bilaterally   DATA REVIEWED:     Latest Reference Range & Units 05/02/22 15:22  C206 ACTH 6 - 50 pg/mL 18  Cortisol, Plasma ug/dL 8.7  Growth Hormone < OR = 7.1 ng/mL 1.3  FSH mIU/ML 0.4  Prolactin ng/mL 3.0  Glucose 70 - 99 mg/dL 84  TSH 1.61 - 0.96 uIU/mL 0.02 (L)  T4,Free(Direct) 0.60 - 1.60 ng/dL 0.45     Brain MRI 02/19/8118   Sella: Stable distorted appearance of sella with thickened infundibulum contiguous with enhancing tissue at the  right. Heterogeneous tissue on the left extending into the anterior suprasellar cistern and through a defect in the anterior left sellar floor. Unchanged probable involvement of the left cavernous sinus. Unchanged mild tethering of the optic chiasm.   Brain: There is no acute infarction or intracranial hemorrhage. There is no intracranial mass, mass effect, or edema. There is no hydrocephalus or extra-axial fluid collection. Ventricles and sulci are normal in size and configuration. Patchy foci of T2 hyperintensity in the supratentorial white matter are nonspecific but probably reflects stable chronic microvascular ischemic changes. Chronic T1 shortening, susceptibility, and T2 hyperintensity in the cerebellar hemispheres. Chronic T1 shortening and susceptibility in the basal ganglia bilaterally. These areas of susceptibility likely reflect mineralization. No abnormal enhancement.   Vascular: Major vessel flow voids at the skull base are preserved.   Skull and upper cervical spine: Normal marrow signal is preserved.   Sinuses/Orbits: Paranasal sinus mucosal thickening including lobular thickening in the frontal sinuses. Orbits are unremarkable.   Other: Mastoid air cells are clear.   IMPRESSION: Stable appearance of treated sella.     Thyroid ultrasound 01/22/2023  Estimated total number of nodules >/= 1 cm: 3   Number of spongiform nodules >/=  2 cm not described below (TR1): 0   Number of mixed cystic and solid nodules >/= 1.5 cm not described below (TR2): 0   _________________________________________________________   Nodule # 4:   Prior biopsy: No   Location: Left; Mid   Maximum size: 1.9 cm; Other 2 dimensions: 1.5 x 0.9 cm, previously, 2.0 x 1.4 x 0.9 cm   Composition: solid/almost completely solid (2)   Echogenicity: isoechoic (1)   Shape: not taller-than-wide (0)   Margins: ill-defined (0)   Echogenic foci: none (0)   ACR TI-RADS total points: 3.    ACR TI-RADS risk category:  TR3 (3 points).   Significant change in size (>/= 20% in two dimensions and minimal increase of 2 mm):   Change in features: No   Change in ACR TI-RADS risk category: No   ACR TI-RADS recommendations:   *Given size (>/= 1.5 - 2.4 cm) and appearance, a follow-up ultrasound in  1 year should be considered based on TI-RADS criteria.   _________________________________________________________   Unchanged appearance of the previously visualized multifocal subcentimeter solid and right inferior solid cystic thyroid nodule which again appear benign not warrant additional ultrasound follow-up or tissue sampling.   No cervical lymphadenopathy.   IMPRESSION: 1. Similar appearing multinodular goiter. 2. Unchanged appearance of solid, left mid thyroid nodule (labeled 4, 1.9 cm, previously 2.0 cm) which again meets criteria (TI-RADS category 3) for 1 year ultrasound follow-up. This study marks 4 years stability.  Marliss Coots, MD       ASSESSMENT / PLAN / RECOMMENDATIONS:   Acromegaly   -She has been evaluated by neurosurgery and was deemed  not to be a surgical candidate -She is S/P radiation treatment in 2021 -She has been on octreotide 3 times daily per subcutaneous injections, unable to switch to long-acting as it is cost prohibitive  and  will cause hardship on the patient as it has to be given through a health professional -Growth hormone level is slightly above (  goal <1 ng/mL) in the past   -MRI of the brain 07/2022 shows stability -She is not a candidate for pegvisomant at this time,  due to 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  Central hypothyroidism  -Free T4 is normal -No changes   Medication Continue levothyroxine 88 mcg daily  3. 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:  -A1c 12.2 % -Poorly controlled diabetes due to medication nonadherence, and dietary indiscretions -I have attempted to prescribe Dexcom in the past-offered her a receiver, but she continues to decline it, I briefly attempted to show her the sensor but she declines -Patient was advised that she MUST check glucose 3 times daily before each meal and use the NovoLog per correction scale - Metformin discontinued due to low GFR in June 2023 -Tradjenta was discontinued due to ineffectiveness and persistent hyperglycemia -She continues to use low-dose than previously prescribed NovoLog, I will ask you to increase it again -A referral to our CDE has been placed   Medication  Increase Basaglar 54 units ONCE  daily Increase NovoLog 6 units 3 times daily before every meal Correction factor : (BG -130/30) 3 times daily before every meal      4. MNG: -No local neck symptoms -Up-to-date on thyroid ultrasound    follow-up in 3 months   Signed electronically by: Lyndle Herrlich, MD  [facility]  [facility] [address] [place] [place], [place] [address] Phone: [phone] FAX: [phone]      CC: [name], MD [address] [place] [address] Phone: [phone]  Fax: [phone]   Return to Endocrinology clinic as below: Future Appointments  Date Time Provider Department Center  06/26/2023  1:00 PM , Konrad Dolores, MD [facility] None  07/02/2023  9:20 AM [name], MD [facility]  08/08/2023 10:15 AM Sue Lush, FNP [facility] None  10/16/2023  9:20 AM [name], MD  [facility]  01/04/2024  1:00 PM [facility] VASC 3 [facility]  01/04/2024  2:00 PM [facility] VASC 3 [facility]  01/04/2024  2:15 PM [facility] PA-2 [facility]  04/25/2024 11:15 AM [facility]-ANNUAL WELLNESS VISIT [facility]

## 2023-06-26 NOTE — Telephone Encounter (Signed)
Threasa Alpha, PT with [facility] stating that today is his last day of PT with the pt. He wanted clarification on which surgical shoe the pt should be wearing, WB status, and restrictions, if any.  Reviewed pt's chart, spoke with McKenzie, PA, returned call, no answer, lf vm on secure line. Informed him that the pt no longer needed to wear a surgical shoe, no restrictions, and WB as normal.

## 2023-06-26 NOTE — Telephone Encounter (Signed)
Beatris Si from [facility] called [phone] . Stated that the pt is being discharged from the home health  therapy she is doing well but the home health nurse is going to continue to see her

## 2023-06-26 NOTE — Patient Instructions (Addendum)
  Start cabergoline 0.5 mg, 1 tablet TWICE WEEKLY ( Wednesday and Sunday) Increase  Basaglar 54 units ONCE DAILY   Novolog Use the scale below to help guide you BEFORE each meal   Blood sugar before meal Number of units to inject  80 -160 6 unit  161 -  190 7 units  191 -  220 8 units  221 -  250 9 units  251 -  280 10 units  281 -  310 11 units  311 -  340 12 units  341 -  370 13 units  371 -  400 14 units     HOW TO TREAT LOW BLOOD SUGARS (Blood sugar LESS THAN 70 MG/DL) Please follow the RULE OF 15 for the treatment of hypoglycemia treatment (when your (blood sugars are less than 70 mg/dL)   STEP 1: Take 15 grams of carbohydrates when your blood sugar is low, which includes:  3-4 GLUCOSE TABS  OR 3-4 OZ OF JUICE OR REGULAR SODA OR ONE TUBE OF GLUCOSE GEL    STEP 2: RECHECK blood sugar in 15 MINUTES STEP 3: If your blood sugar is still low at the 15 minute recheck --> then, go back to STEP 1 and treat AGAIN with another 15 grams of carbohydrates.

## 2023-06-28 ENCOUNTER — Other Ambulatory Visit: Payer: Self-pay

## 2023-06-28 ENCOUNTER — Telehealth: Payer: Self-pay

## 2023-06-28 DIAGNOSIS — I70644 Atherosclerosis of nonbiological bypass graft(s) of the left leg with ulceration of heel and midfoot: Secondary | ICD-10-CM

## 2023-06-28 NOTE — Telephone Encounter (Signed)
April, RN with [facility] called requesting verbal orders to decrease frequency of visits to weekly. She reports that the area at the pt's heel is no longer draining and is healed to the point of not needing dressing changes.  Reviewed pt's chart, returned call, no answer, lf vm giving verbal orders.

## 2023-06-29 ENCOUNTER — Telehealth: Payer: Self-pay

## 2023-06-29 MED ORDER — FUROSEMIDE 40 MG PO TABS: 40.0000 mg | ORAL_TABLET | Freq: Two times a day (BID) | ORAL | 3 refills | Status: DC

## 2023-06-29 MED ORDER — SIMVASTATIN 20 MG PO TABS: 20.0000 mg | ORAL_TABLET | Freq: Every evening | ORAL | 0 refills | Status: DC

## 2023-06-29 MED ORDER — AMLODIPINE BESYLATE 5 MG PO TABS: 5.0000 mg | ORAL_TABLET | Freq: Every day | ORAL | 0 refills | Status: DC

## 2023-06-29 NOTE — Telephone Encounter (Signed)
Left vm for patient to ask if she would like her medication sent to [facility] now, as we received request.

## 2023-06-29 NOTE — Telephone Encounter (Signed)
Received an incoming fax from [facility] for refills for patient. [facility] to pharmacy.

## 2023-07-02 ENCOUNTER — Encounter: Payer: Self-pay | Admitting: Family Medicine

## 2023-07-02 ENCOUNTER — Ambulatory Visit (INDEPENDENT_AMBULATORY_CARE_PROVIDER_SITE_OTHER): Payer: Medicare HMO | Admitting: Family Medicine

## 2023-07-02 VITALS — BP 127/78 | HR 79 | Temp 98.0°F | Ht 66.0 in | Wt 216.6 lb

## 2023-07-02 DIAGNOSIS — E1122 Type 2 diabetes mellitus with diabetic chronic kidney disease: Secondary | ICD-10-CM

## 2023-07-02 DIAGNOSIS — I10 Essential (primary) hypertension: Secondary | ICD-10-CM | POA: Diagnosis not present

## 2023-07-02 DIAGNOSIS — K635 Polyp of colon: Secondary | ICD-10-CM

## 2023-07-02 DIAGNOSIS — E782 Mixed hyperlipidemia: Secondary | ICD-10-CM | POA: Diagnosis not present

## 2023-07-02 DIAGNOSIS — B3731 Acute candidiasis of vulva and vagina: Secondary | ICD-10-CM | POA: Diagnosis not present

## 2023-07-02 DIAGNOSIS — N1832 Chronic kidney disease, stage 3b: Secondary | ICD-10-CM

## 2023-07-02 DIAGNOSIS — E1169 Type 2 diabetes mellitus with other specified complication: Secondary | ICD-10-CM

## 2023-07-02 DIAGNOSIS — Z89619 Acquired absence of unspecified leg above knee: Secondary | ICD-10-CM

## 2023-07-02 DIAGNOSIS — I739 Peripheral vascular disease, unspecified: Secondary | ICD-10-CM | POA: Diagnosis not present

## 2023-07-02 DIAGNOSIS — Z794 Long term (current) use of insulin: Secondary | ICD-10-CM | POA: Diagnosis not present

## 2023-07-02 MED ORDER — GLUCOSE BLOOD VI STRP
1.0000 | ORAL_STRIP | Freq: Three times a day (TID) | 1 refills | Status: DC
Start: 2023-07-02 — End: 2024-01-23

## 2023-07-02 NOTE — Assessment & Plan Note (Signed)
Continue current medication Order lipid panel for future visit

## 2023-07-02 NOTE — Assessment & Plan Note (Signed)
>>  ASSESSMENT AND PLAN FOR TYPE 2 DIABETES MELLITUS WITH STAGE 3B CHRONIC KIDNEY DISEASE, WITH LONG-TERM CURRENT USE OF INSULIN  (HCC) WRITTEN ON 07/02/2023 11:59 AM BY SEBASTIAN BEVERLEY NOVAK, MD  Managed by endocrinologist. Refill test strips. Orders: Future visit with fasting metabolic panel , urinalysis, microalbumin/creatinine ratio and other restratification labs.

## 2023-07-02 NOTE — Assessment & Plan Note (Signed)
Stable with home numbers  Plan: Continue current regimen and monitor blood pressures at home

## 2023-07-02 NOTE — Progress Notes (Signed)
Assessment/Plan:   Problem List Items Addressed This Visit       Cardiovascular and Mediastinum   Essential hypertension    Stable with home numbers  Plan: Continue current regimen and monitor blood pressures at home      Relevant Orders   Comprehensive metabolic panel   PAD (peripheral artery disease) (HCC)   Relevant Orders   CBC with Differential/Platelet     Endocrine   Type 2 diabetes mellitus with stage 3b chronic kidney disease, with long-term current use of insulin (HCC) - Primary    Managed by endocrinologist. Refill test strips. Orders: Future visit with fasting metabolic panel , urinalysis, microalbumin/creatinine ratio and other restratification labs.      Relevant Medications   glucose blood test strip   Other Relevant Orders   Microalbumin / creatinine urine ratio   Urinalysis, Routine w reflex microscopic     Genitourinary   Candida vaginitis    Resolved status post Diflucan.  Continue to monitor.        Other   HLD (hyperlipidemia)    Continue current medication Order lipid panel for future visit      Relevant Orders   Lipid panel   History of diabetes-related lower limb amputation (HCC)   Other Visit Diagnoses     Polyp of colon, unspecified part of colon, unspecified type       Relevant Orders   Ambulatory referral to Gastroenterology       Medications Discontinued During This Encounter  Medication Reason   glucose blood test strip Reorder    Return in about 6 months (around 01/02/2024) for BP.    Subjective:   Encounter date: 07/02/2023  Danielle Schroeder is a 67 y.o. female who has Goiter; Acquired hypothyroidism; Pituitary adenoma with extrasellar extension (HCC); Acromegaly (HCC); CHF (congestive heart failure) (HCC); Stable treated proliferative diabetic retinopathy of right eye determined by examination associated with type 2 diabetes mellitus (HCC); Proliferative diabetic retinopathy of left eye with macular edema  associated with type 2 diabetes mellitus (HCC); Retinal hemorrhage of left eye; Retinal microaneurysm of both eyes; Retinal hemorrhage of right eye; Vitamin D deficiency; Vitreomacular adhesion, left; Type 2 diabetes mellitus with stage 3b chronic kidney disease, with long-term current use of insulin (HCC); DM2 (diabetes mellitus, type 2) (HCC); Pseudophakia, both eyes; Cataract; CKD stage 3b, GFR 30-44 ml/min (HCC); Diabetic neuropathy (HCC); Diabetic retinopathy associated with type 2 diabetes mellitus (HCC); Essential hypertension; HLD (hyperlipidemia); Obesity; Pituitary mass (HCC); Tubular adenoma of colon; 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   She  has a past medical history of Cataract, CHF (congestive heart failure) (HCC), Diabetes mellitus without complication (HCC), Hyperlipidemia, Hypertension, Pituitary adenoma with extrasellar extension (HCC), Sleep apnea, and Thyroid disease..   Chief Complaint: Follow-up on vaginal discharge and diabetes management.  History of Present Illness:  Vaginal Discharge: The patient reports improvement in vaginal discharge after taking Diflucan (fluconazole). She initially mentioned using a gel but corrected herself, confirming the use of fluconazole pills. The patient completed the regimen of one Diflucan pill followed by another three days later, confirming that the symptoms have improved.  Diabetes Management: The patient is under  the care of an endocrinologist for diabetes management. She requires a refill for her test strips  and mentioned the pharmacy is currently out of stock. This morning's blood glucose was not checked, but yesterday's fasting was 137.  Hypertension: Blood pressure today was slightly elevated, but at home, measurements have been within the normal range (127/78 mmHg).  Preventative Care:  OB/GYN: Patient is due for a pap smear scheduled for August 25th with her OB/GYN. Eye Exam: Patient has seen Dr. Luciana Axe for an eye exam but will need to sign a release of records. Vaccines: Patient is due for the COVID and flu vaccines in the fall but has declined the flu shot and mentioned receiving the pneumonia vaccine several years ago. Colonoscopy: The patient has a history of colon polyps; last colonoscopy was three years ago. Patient is due for a colonoscopy this year.   Review of Systems  Constitutional:  Negative for chills, diaphoresis, fever, malaise/fatigue and weight loss.  HENT:  Negative for congestion, ear discharge, ear pain and hearing loss.   Eyes:  Negative for blurred vision, double vision, photophobia, pain, discharge and redness.  Respiratory:  Negative for cough, sputum production and wheezing. Shortness of breath: None at this visit,  shortness of breath unless undertaking significant physical activity; resolves with rest. Not worsening.  Cardiovascular:  Negative for chest pain and palpitations.  Gastrointestinal:  Negative for abdominal pain, blood in stool, constipation, diarrhea, heartburn, melena, nausea and vomiting.  Genitourinary:  Negative for dysuria, flank pain, frequency, hematuria and urgency.  Musculoskeletal:  Negative for myalgias.  Skin:  Negative for itching and rash.  Neurological:  Negative for dizziness, tingling, tremors, speech change, seizures, loss of consciousness, weakness and headaches.  Endo/Heme/Allergies:  Negative for polydipsia.  Psychiatric/Behavioral:  Negative for depression, hallucinations, memory loss, substance abuse and suicidal ideas. The patient  does not have insomnia.   All other systems reviewed and are negative.   Past Surgical History:  Procedure Laterality Date   ABDOMINAL AORTOGRAM W/LOWER EXTREMITY Left 04/11/2023   Procedure: ABDOMINAL AORTOGRAM W/LOWER EXTREMITY;  Surgeon: Victorino Sparrow, MD;  Location: [facility] INVASIVE CV LAB;  Service: Cardiovascular;  Laterality: Left;   ABDOMINAL AORTOGRAM W/LOWER EXTREMITY N/A 05/16/2023   Procedure: ABDOMINAL AORTOGRAM W/LOWER EXTREMITY;  Surgeon: Victorino Sparrow, MD;  Location: [facility] INVASIVE CV LAB;  Service: Cardiovascular;  Laterality: N/A;   AMPUTATION Right 05/18/2023   Procedure: RIGHT GREAT TOE AMPUTATION;  Surgeon: Maeola Harman, MD;  Location: [facility] OR;  Service: Vascular;  Laterality: Right;   BREAST SURGERY     CHOLECYSTECTOMY     PERIPHERAL VASCULAR BALLOON ANGIOPLASTY Left 04/11/2023   Procedure: PERIPHERAL VASCULAR BALLOON ANGIOPLASTY;  Surgeon: Victorino Sparrow, MD;  Location: [facility] INVASIVE CV LAB;  Service: Cardiovascular;  Laterality: Left;  Lt AT   PERIPHERAL VASCULAR INTERVENTION Left 04/11/2023   Procedure: PERIPHERAL VASCULAR INTERVENTION;  Surgeon: Victorino Sparrow, MD;  Location: [facility] INVASIVE CV LAB;  Service: Cardiovascular;  Laterality: Left;  Lt SFA   PERIPHERAL VASCULAR INTERVENTION  05/16/2023   Procedure: PERIPHERAL VASCULAR INTERVENTION;  Surgeon: Victorino Sparrow, MD;  Location: [facility] INVASIVE CV LAB;  Service: Cardiovascular;;   TONSILLECTOMY     TUBAL LIGATION      Outpatient Medications Prior to Visit  Medication Sig Dispense Refill   acetaminophen (TYLENOL) 325 MG tablet Take 2 tablets (650 mg total) by mouth every 4 (four) hours as needed for headache or mild pain. 30 tablet 0   amLODipine (NORVASC) 5 MG  tablet Take 1 tablet (5 mg total) by mouth daily. 90 tablet 0   aspirin EC 81 MG tablet Take 81 mg by mouth in the morning.     cabergoline (DOSTINEX) 0.5 MG tablet Take 0.5 tablets (0.25 mg total) by mouth 2 (two) times a week. 24 tablet 3   carvedilol  (COREG) 25 MG tablet Take 25 mg by mouth 2 (two) times daily with a meal.     furosemide (LASIX) 40 MG tablet Take 1 tablet (40 mg total) by mouth 2 (two) times daily. 180 tablet 3   insulin aspart (NOVOLOG FLEXPEN) 100 UNIT/ML FlexPen Max daily 45 units (Patient taking differently: Inject 0-6 Units into the skin 3 (three) times daily before meals. Max daily 45 units) 45 mL 3   Insulin Glargine (BASAGLAR KWIKPEN) 100 UNIT/ML Inject 54 Units into the skin daily. 60 mL 3   Insulin Pen Needle (PEN NEEDLES) 31G X 5 MM MISC 1 each by Does not apply route in the morning, at noon, in the evening, and at bedtime. 400 each 3   levothyroxine (SYNTHROID) 88 MCG tablet Take 1 tablet (88 mcg total) by mouth daily. 90 tablet 3   octreotide (SANDOSTATIN) 100 MCG/ML SOLN injection Inject 1 mL (100 mcg total) into the skin 3 (three) times daily. 270 mL 3   potassium chloride (KLOR-CON) 10 MEQ tablet Take 1 tablet (10 mEq total) by mouth daily. 90 tablet 3   simvastatin (ZOCOR) 20 MG tablet Take 1 tablet (20 mg total) by mouth every evening. 90 tablet 0   SYRINGE-NEEDLE, DISP, 3 ML 25G X 1" 3 ML MISC Inject 1 ml into the skin 3 times daily. 100 each 11   Vitamin D, Cholecalciferol, 25 MCG (1000 UT) CAPS Take 2 capsules by mouth daily. (Patient taking differently: Take 2 capsules by mouth daily with lunch.) 60 capsule 0   glucose blood test strip 1 each by Other route 3 (three) times daily. Use as instructed     fluconazole (DIFLUCAN) 150 MG tablet Take 1 tablet (150 mg total) by mouth every three (3) days as needed. (Patient not taking: Reported on 06/26/2023) 2 tablet 0   No facility-administered medications prior to visit.    Family History  Problem Relation Age of Onset   Thyroid disease Neg Hx    Colon cancer Neg Hx    Esophageal cancer Neg Hx    Rectal cancer Neg Hx    Stomach cancer Neg Hx     Social History   Socioeconomic History   Marital status: Single    Spouse name: Not on file   Number of  children: Not on file   Years of education: Not on file   Highest education level: Not on file  Occupational History   Not on file  Tobacco Use   Smoking status: Never   Smokeless tobacco: Never  Vaping Use   Vaping status: Never Used  Substance and Sexual Activity   Alcohol use: Never   Drug use: Never   Sexual activity: Not Currently  Other Topics Concern   Not on file  Social History Narrative   Not on file   Social Determinants of Health   Financial Resource Strain: Low Risk  (04/20/2023)   Overall Financial Resource Strain (CARDIA)    Difficulty of Paying Living Expenses: Not hard at all  Food Insecurity: No Food Insecurity (05/13/2023)   Hunger Vital Sign    Worried About Running Out of Food in the Last Year: Never  true    Ran Out of Food in the Last Year: Never true  Transportation Needs: No Transportation Needs (05/13/2023)   PRAPARE - Administrator, Civil Service (Medical): No    Lack of Transportation (Non-Medical): No  Physical Activity: Inactive (04/20/2023)   Exercise Vital Sign    Days of Exercise per Week: 0 days    Minutes of Exercise per Session: 0 min  Stress: No Stress Concern Present (04/20/2023)   [employer] of Occupational Health - Occupational Stress Questionnaire    Feeling of Stress : Not at all  Social Connections: Not on File (03/02/2022)   Received from [place], [place]   Social Connections    Social Connections and Isolation: 0  Intimate Partner Violence: Not At Risk (05/13/2023)   Humiliation, Afraid, Rape, and Kick questionnaire    Fear of Current or Ex-Partner: No    Emotionally Abused: No    Physically Abused: No    Sexually Abused: No                                                                                                  Objective:  Physical Exam: BP 127/78 Comment: home  Pulse 79   Temp 98 F (36.7 C) (Oral)   Ht 5\' 6"  (1.676 m)   Wt 216 lb 9.6 oz (98.2 kg)   SpO2 97%   BMI 34.96 kg/m     Physical  Exam Constitutional:      General: She is not in acute distress.    Appearance: Normal appearance. She is not ill-appearing or toxic-appearing.  HENT:     Head: Normocephalic and atraumatic.     Nose: Nose normal. No congestion.  Eyes:     General: No scleral icterus.    Extraocular Movements: Extraocular movements intact.  Cardiovascular:     Rate and Rhythm: Normal rate and regular rhythm.     Pulses: Normal pulses.     Heart sounds: Normal heart sounds.  Pulmonary:     Effort: Pulmonary effort is normal. No respiratory distress.     Breath sounds: Normal breath sounds.  Abdominal:     General: Abdomen is flat. Bowel sounds are normal.     Palpations: Abdomen is soft.  Musculoskeletal:        General: Normal range of motion.     Left Lower Extremity: Left leg is amputated below knee.  Lymphadenopathy:     Cervical: No cervical adenopathy.  Skin:    General: Skin is warm and dry.     Findings: No rash.  Neurological:     General: No focal deficit present.     Mental Status: She is alert and oriented to person, place, and time. Mental status is at baseline.     Gait: Gait (Uses cane) normal.  Psychiatric:        Mood and Affect: Mood normal.        Behavior: Behavior normal.        Thought Content: Thought content normal.        Judgment: Judgment normal.     VAS  [place] LOWER EXTREMITY ARTERIAL DUPLEX  Result Date: 06/04/2023 LOWER EXTREMITY ARTERIAL DUPLEX STUDY Patient Name:  VELICIA WALKER  Date of Exam:   06/01/2023 Medical Rec #: [id number]          Accession #:    [id number] Date of Birth: October 13, 1956          Patient Gender: F Patient Age:   67 years Exam Location:  [facility] Procedure:      VAS [place] LOWER EXTREMITY ARTERIAL DUPLEX Referring Phys: Graceann Congress --------------------------------------------------------------------------------  Indications: Peripheral artery disease. 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  Vascular  Interventions: 04/11/23: Left SFA stent , balloon angioplasty left ATA.                         05/12/23: Right PTA, peroneal balloon angioplasty. Current ABI:            Right 0.63/amp Left 0.96/0.49 Comparison Study: none Performing Technologist: Thereasa Parkin RVT  Examination Guidelines: A complete evaluation includes B-mode imaging, spectral Doppler, color Doppler, and power Doppler as needed of all accessible portions of each vessel. Bilateral testing is considered an integral part of a complete examination. Limited examinations for reoccurring indications may be performed as noted.  +-----------+--------+-----+--------+----------+--------------+ LEFT       PSV cm/sRatioStenosisWaveform  Comments       +-----------+--------+-----+--------+----------+--------------+ CFA Distal 115                  monophasic               +-----------+--------+-----+--------+----------+--------------+ DFA        103                  biphasic                 +-----------+--------+-----+--------+----------+--------------+ SFA Prox   132                  monophasic               +-----------+--------+-----+--------+----------+--------------+ SFA Mid    142                  monophasic               +-----------+--------+-----+--------+----------+--------------+ SFA Distal 124                  monophasic               +-----------+--------+-----+--------+----------+--------------+ POP Prox   126                  monophasic               +-----------+--------+-----+--------+----------+--------------+ POP Distal 81                   monophasic               +-----------+--------+-----+--------+----------+--------------+ ATA Distal 74                   monophasic               +-----------+--------+-----+--------+----------+--------------+ PTA Distal 117                  monophasic               +-----------+--------+-----+--------+----------+--------------+ PERO  Distal  not visualized +-----------+--------+-----+--------+----------+--------------+  Summary: Left: No evidence of stenosis. Stent walls not visualized.  See table(s) above for measurements and observations. Electronically signed by Heath Lark on 06/04/2023 at 8:19:15 AM.    Final    VAS [place] ABI WITH/WO TBI  Result Date: 06/04/2023  LOWER EXTREMITY DOPPLER STUDY Patient Name:  CHERYLL CARAWAN  Date of Exam:   06/01/2023 Medical Rec #: [id number]          Accession #:    [id number] Date of Birth: [birth year]          Patient Gender: F Patient Age:   40 years Exam Location:  [facility] Procedure:      VAS [place] ABI WITH/WO TBI Referring Phys: Graceann Congress --------------------------------------------------------------------------------  Indications: Peripheral artery disease. 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  Vascular Interventions: 04/11/23: Left SFA stent , balloon angioplasty left ATA.                         05/12/23: Right PTA, peroneal balloon angioplasty. Limitations: Today's exam was limited due to bandages. Performing Technologist: Thereasa Parkin RVT  Examination Guidelines: A complete evaluation includes at minimum, Doppler waveform signals and systolic blood pressure reading at the level of bilateral brachial, anterior tibial, and posterior tibial arteries, when vessel segments are accessible. Bilateral testing is considered an integral part of a complete examination. Photoelectric Plethysmograph (PPG) waveforms and toe systolic pressure readings are included as required and additional duplex testing as needed. Limited examinations for reoccurring indications may be performed as noted.  ABI Findings: +---------+-----------------+-----+----------+---------------------------------+ Right    Rt Pressure      IndexWaveform  Comment                                    (mmHg)                                                             +---------+-----------------+-----+----------+---------------------------------+ Brachial 162                                                               +---------+-----------------+-----+----------+---------------------------------+ PTA      102              0.63 monophasic                                  +---------+-----------------+-----+----------+---------------------------------+ DP       0                0.00           not found--may be due to                                                   bandaging                         +---------+-----------------+-----+----------+---------------------------------+  Great Toe                                amputation                        +---------+-----------------+-----+----------+---------------------------------+ +---------+------------------+-----+----------+-------+ Left     Lt Pressure (mmHg)IndexWaveform  Comment +---------+------------------+-----+----------+-------+ Brachial 155                                      +---------+------------------+-----+----------+-------+ PTA      76                0.47 monophasic        +---------+------------------+-----+----------+-------+ DP       156               0.96 monophasic        +---------+------------------+-----+----------+-------+ Great Toe80                0.49                   +---------+------------------+-----+----------+-------+ +-------+-----------+-----------+------------+------------+ ABI/TBIToday's ABIToday's TBIPrevious ABIPrevious TBI +-------+-----------+-----------+------------+------------+ Right  0.63                  0.92        0.43         +-------+-----------+-----------+------------+------------+ Left   0.96       0.59       0.59        0.36         +-------+-----------+-----------+------------+------------+  Previous ABI at [facility] on 07/20/22.  Summary: Right: Resting right ankle-brachial  index indicates moderate right lower extremity arterial disease. Left: Resting left ankle-brachial index is within normal range. The left toe-brachial index is abnormal. *See table(s) above for measurements and observations.  Electronically signed by Heath Lark on 06/04/2023 at 8:18:05 AM.    Final    PERIPHERAL VASCULAR CATHETERIZATION  Result Date: 05/16/2023 Images from the original result were not included.   Patient name: Danielle Schroeder      MRN: [id number]        DOB: 28-Aug-1956          Sex: female  05/12/2023 - 05/16/2023 Pre-operative Diagnosis: Right lower extremity critical ischemia with tissue loss toe Post-operative diagnosis:  Same Surgeon:  Victorino Sparrow, MD Procedure Performed: 1.  Ultrasound-guided access of the left common femoral artery 2.  Second-order cannulation, right lower extremity angiogram 3.  Third cannulation, right lower extremity angiogram from the superficial femoral artery 4.  Third order cannulation, please order angiogram 5.  Balloon angioplasty 3 x 100 posterior tibial artery, peroneal artery 6.  Moderate sedation time 44, contrast volume 50ml 7.  Device assisted closure-Pro-glide   Indications: Patient is a 67 year old female well-known to my practice recently had left lower extremity critical ischemia with tissue loss.  She underwent intervention, and this is since healed.  She presented with new right lower extremity tissue loss at the great toe with depressed ABIs, nonpalpable pulse.  After discussing risk and benefits of right lower extremity angiogram in an effort to define and treat lesions to improve distal perfusion for wound healing, Maricarmen elected to proceed.  Findings: Aortogram: Deferred-see study from last month-no flow-limiting stenosis On the right: Widely patent common iliac artery, external neck artery, hypogastric artery.  Widely  patent common femoral artery, profunda, superficial femoral artery, popliteal artery.  Severe tibial disease. Single-vessel  inline flow via the peroneal artery. Severe tibial disease with anterior tibial artery occlusion 3 cm from the ostia, peroneal artery with 2, tandem, 80% flow-limiting lesions proximally.  Proximal posterior tibial artery occlusion with reconstitution at the mid posterior tibial artery continuing into the foot via the plantar branches.  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             Procedure:  The patient was identified in the holding area and taken to room 8.  The patient was then placed supine on the table and prepped and draped in the usual sterile fashion.  A time out was called.  Ultrasound was used to evaluate the left common femoral artery.  It was patent .  A digital ultrasound image was acquired.  A micropuncture needle was used to access the left common femoral artery under ultrasound guidance.  An 018 wire was advanced without resistance and a micropuncture sheath was placed.  The 018 wire was removed and a benson wire was placed.  The micropuncture sheath was exchanged for a 5 french sheath.  Angiogram was deferred as 1 was completed last month.  An Omni Flush catheter was used to traverse the aortic arch.  The Omni Flush catheter was parked in the right common iliac artery and pelvic angiogram followed.  It was then positioned in the right common femoral artery for right lower extremity angiogram to the foot.  See results above.    I elected to attempt intervention on the posterior tibial and peroneal arteries.  A 6 French by 60 cm sheath was brought onto the field and parked in the superficial femoral artery.  Diagnostic angiography followed from the superficial femoral artery to map the popliteal artery, peroneal artery, posterior tibial artery.  The patient was heparinized.  I was able to use a series of wires and catheters to cannulate the posterior tibial artery and cross the occlusion.  The wire was sent below the level of the ankle into a plantar branch.  I followed this with a catheter  and angiography to ensure I was true lumen.  I was, angiography from this site showed that the plantar arch was incomplete.  Next, we used a 3 x 100 mm balloon to angioplasty the proximal portion of the posterior tibial artery this was inflated for 3 minutes.  Follow-up angiography demonstrated excellent result with resolution of the occlusion.  The vessel was widely patent.  Next, I pulled the balloon and wire back and recannulated the peroneal artery.  The same 3 x 100 mm balloon was used to plasty the peroneal artery.  Follow-up angiography demonstrated excellent result with resolution of the tandem, flow-limiting stenoses.   This was an excellent result.  Patient was closed with the use of a ProGlide device.  Impression: Recanalization of the posterior tibial artery.  Resolution of flow-limiting stenosis of the peroneal artery.  Two-vessel runoff to the foot with severe microvascular disease appreciated distally.   Fara Olden, MD [facility] Office: [phone]    ECHOCARDIOGRAM COMPLETE  Result Date: 05/14/2023    ECHOCARDIOGRAM REPORT   Patient Name:   Danielle Schroeder Date of Exam: 05/14/2023 Medical Rec #:  [id number]         Height:       67.0 in Accession #:    [id number]        Weight:  221.6 lb Date of Birth:  29-Sep-1956         BSA:          2.112 m Patient Age:    66 years          BP:           141/80 mmHg Patient Gender: F                 HR:           86 bpm. Exam Location:  Inpatient Procedure: 2D Echo, Cardiac Doppler and Color Doppler Indications:    CHF-I50.9  History:        Patient has prior history of Echocardiogram examinations, most                 recent 02/03/2020. CHF, PAD; Risk Factors:Hypertension, Sleep                 Apnea, Diabetes and Dyslipidemia. CKD, stage 3.  Sonographer:    Lucendia Herrlich Referring Phys: [id number] ANAND D HONGALGI IMPRESSIONS  1. Left ventricular ejection fraction, by estimation, is 60 to 65%. The left ventricle has  normal function. The left ventricle has no regional wall motion abnormalities. There is mild left ventricular hypertrophy of the basal-septal segment. Left ventricular diastolic parameters are consistent with Grade I diastolic dysfunction (impaired relaxation).  2. Right ventricular systolic function is normal. The right ventricular size is normal. There is normal pulmonary artery systolic pressure. The estimated right ventricular systolic pressure is 31.8 mmHg.  3. The mitral valve is degenerative. Trivial mitral valve regurgitation. Mild mitral stenosis. The mean mitral valve gradient is 3.6 mmHg with average heart rate of 84 bpm. Moderate mitral annular calcification.  4. The aortic valve is tricuspid. Aortic valve regurgitation is not visualized.  5. The inferior vena cava is normal in size with <50% respiratory variability, suggesting right atrial pressure of 8 mmHg. Comparison(s): Changes from prior study are noted. 02/03/2020: LVEF 55-60%. FINDINGS  Left Ventricle: Left ventricular ejection fraction, by estimation, is 60 to 65%. The left ventricle has normal function. The left ventricle has no regional wall motion abnormalities. The left ventricular internal cavity size was normal in size. There is  mild left ventricular hypertrophy of the basal-septal segment. Left ventricular diastolic parameters are consistent with Grade I diastolic dysfunction (impaired relaxation). Indeterminate filling pressures. Right Ventricle: The right ventricular size is normal. No increase in right ventricular wall thickness. Right ventricular systolic function is normal. There is normal pulmonary artery systolic pressure. The tricuspid regurgitant velocity is 2.44 m/s, and  with an assumed right atrial pressure of 8 mmHg, the estimated right ventricular systolic pressure is 31.8 mmHg. Left Atrium: Left atrial size was normal in size. Right Atrium: Right atrial size was normal in size. Pericardium: There is no evidence of  pericardial effusion. Mitral Valve: The mitral valve is degenerative in appearance. There is mild calcification of the anterior and posterior mitral valve leaflet(s). Moderate mitral annular calcification. Trivial mitral valve regurgitation. Mild mitral valve stenosis. The mean mitral valve gradient is 3.6 mmHg with average heart rate of 84 bpm. Tricuspid Valve: The tricuspid valve is grossly normal. Tricuspid valve regurgitation is trivial. Aortic Valve: The aortic valve is tricuspid. Aortic valve regurgitation is not visualized. Aortic valve peak gradient measures 10.1 mmHg. Pulmonic Valve: The pulmonic valve was normal in structure. Pulmonic valve regurgitation is not visualized. Aorta: The aortic root and ascending aorta are structurally normal, with no evidence of dilitation. Venous:  The inferior vena cava is normal in size with less than 50% respiratory variability, suggesting right atrial pressure of 8 mmHg. IAS/Shunts: No atrial level shunt detected by color flow Doppler.  LEFT VENTRICLE PLAX 2D LVIDd:         3.90 cm   Diastology LVIDs:         2.80 cm   LV e' medial:    7.93 cm/s LV PW:         0.90 cm   LV E/e' medial:  14.6 LV IVS:        1.30 cm   LV e' lateral:   10.90 cm/s LVOT diam:     2.20 cm   LV E/e' lateral: 10.6 LV SV:         77 LV SV Index:   37 LVOT Area:     3.80 cm  RIGHT VENTRICLE             IVC RV S prime:     12.40 cm/s  IVC diam: 1.70 cm TAPSE (M-mode): 2.1 cm LEFT ATRIUM             Index        RIGHT ATRIUM          Index LA diam:        3.70 cm 1.75 cm/m   RA Area:     9.31 cm LA Vol (A2C):   53.3 ml 25.21 ml/m  RA Volume:   15.60 ml 7.39 ml/m LA Vol (A4C):   69.2 ml 32.76 ml/m LA Biplane Vol: 71.5 ml 33.85 ml/m  AORTIC VALVE AV Area (Vmax): 2.93 cm AV Vmax:        159.00 cm/s AV Peak Grad:   10.1 mmHg LVOT Vmax:      122.67 cm/s LVOT Vmean:     78.133 cm/s LVOT VTI:       0.203 m  AORTA Ao Root diam: 3.20 cm Ao Asc diam:  3.70 cm MITRAL VALVE                TRICUSPID VALVE  MV Area (PHT): 3.65 cm     TR Peak grad:   23.8 mmHg MV Mean grad:  3.6 mmHg     TR Vmax:        244.00 cm/s MV Decel Time: 208 msec MV E velocity: 116.00 cm/s  SHUNTS MV A velocity: 150.00 cm/s  Systemic VTI:  0.20 m MV E/A ratio:  0.77         Systemic Diam: 2.20 cm Zoila Shutter MD Electronically signed by Zoila Shutter MD Signature Date/Time: 05/14/2023/3:51:40 PM    Final    DG Foot Complete Right  Result Date: 05/12/2023 CLINICAL DATA:  Great toe wound, concern for osteomyelitis EXAM: RIGHT FOOT COMPLETE - 3+ VIEW COMPARISON:  None Available. FINDINGS: No fracture or dislocation of the right foot. Mild arthrosis. Bony erosion of the tuft of the right great toe. Overlying soft tissue wound. Diffuse soft tissue edema about the foot and ankle. IMPRESSION: 1. Bony erosion of the tuft of the right great toe, concerning for osteomyelitis. Overlying soft tissue wound. 2. No fracture or dislocation of the right foot. 3. Diffuse soft tissue edema about the foot and ankle. Electronically Signed   By: Jearld Lesch M.D.   On: 05/12/2023 21:10   PERIPHERAL VASCULAR CATHETERIZATION  Result Date: 04/11/2023 Images from the original result were not included. 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  Ultrasound-guided micropuncture access of the right common femoral artery 2.  Aortogram 3.  Secondary cannulation, left lower extremity angiogram 4.  Third order cannulation, angiogram from the superficial femoral artery 5.  Third order cannulation, angiogram from the popliteal artery 6.  Third order cannulation, angiogram from the anterior tibial artery 7.  Superficial femoral artery stenting 6 x 80 mm Innova postdilated using a 5 x 80 mm drug-coated  balloon 8.  Balloon angioplasty of the anterior tibial artery 3 x 220 mm Indications: Patient is a 67 year old female with mixed arterial venous disease.  She has had wounds on bilateral feet for several months due to fluid overload and wearing compression stockings that are too small.  The right sided wound is nearly healed, the left side is still present.  The left side also has a heel wound.  After discussing risk and benefits of left lower extremity angiogram in an effort to define and improve distal perfusion to improve wound healing, Bethzy elected to proceed. 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  Popliteal artery widely patent with no flow-limiting stenosis.  Severe tibial disease with no inline flow to the foot.  The anterior tibial artery is initially patent, but occludes for roughly 150 cm prior to reconstitution at the distal tibia.  The peroneal artery provides the majority of the runoff to the mid tibia prior to becoming atretic and giving off several collaterals.  The posterior tibial artery is not appreciated and does not fill the foot.  Procedure:  The patient was identified in the holding area and taken to room 8.  The patient was then placed supine on the table and prepped and draped in the usual sterile fashion.  A time out was called.  Ultrasound was used to evaluate the right common femoral artery.  It was patent .  A digital ultrasound image was acquired.  A micropuncture needle was used to access the right common femoral artery under ultrasound guidance.  An 018 wire was advanced without resistance and a micropuncture sheath was placed.  The 018 wire was removed and a benson wire was placed.  The micropuncture sheath was  exchanged for a 5 french sheath.  An omniflush catheter was advanced over the wire to the level of L-1.  An abdominal angiogram was obtained.  Next, using the omniflush catheter and a benson wire, the aortic bifurcation was crossed and the catheter was placed into the left external iliac artery and left runoff was obtained. 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  The patient was heparinized and a 6 x 65 cm catheter was brought onto the field and parked in the mid superficial femoral artery.  Angiography was used to further define the lesion in the superficial femoral artery.  There were multiple collaterals and the occlusion appeared to be roughly 50 mm in length.  A series of wires and catheters were used to cross the superficial femoral artery occlusion.  Angiography followed from the popliteal artery to ensure that I was true lumen prior to moving to stenting.  There was no drug-eluting stent available, therefore a 6 x 80 mm Innova stent was placed followed by drug-coated balloon angioplasty using a 5 x 80 mm balloon.  Follow-up angiography demonstrated excellent result with resolution of the occlusion.  There was brisk flow through the stent with no stenosis.  Next, my attention turned to the anterior tibial artery.  Using a series of wires and catheters, I was able to cross the 150 cm occlusion.  Diagnostic angiography followed from the distal anterior tibial artery to prove that I was true lumen.  There was excellent runoff into the dorsalis pedis artery.  Next, a 3 x 220 mm balloon was brought to the field and inflated for 3 minutes.  Follow-up angiography demonstrated excellent result with resolution of the occlusion. Impression: Successful recanalization of the superficial femoral artery with stenting 6 x 80 mm.  Successful recanalization of the  anterior tibial artery with balloon angioplasty 3 x 220 mm.  At completion, the patient had inline flow to the foot via single-vessel anterior tibial artery runoff to the dorsalis pedis artery. Fara Olden, MD [facility] Office: [phone]    Recent Results (from the past 2160 hour(s))  I-STAT, chem 8     Status: Abnormal   Collection Time: 04/11/23  8:55 AM  Result Value Ref Range   Sodium 138 135 - 145 mmol/L   Potassium 3.6 3.5 - 5.1 mmol/L   Chloride 103 98 - 111 mmol/L   BUN 13 8 - 23 mg/dL   Creatinine, Ser 0.98 (H) 0.44 - 1.00 mg/dL   Glucose, Bld 119 (H) 70 - 99 mg/dL    Comment: Glucose reference range applies only to samples taken after fasting for at least 8 hours.   Calcium, Ion 1.10 (L) 1.15 - 1.40 mmol/L   TCO2 22 22 - 32 mmol/L   Hemoglobin 11.6 (L) 12.0 - 15.0 g/dL   HCT 14.7 (L) 82.9 - 56.2 %  POCT Activated clotting time     Status: None   Collection Time: 04/11/23 11:13 AM  Result Value Ref Range   Activated Clotting Time 217 seconds    Comment: Reference range 74-137 seconds for patients not on anticoagulant therapy.  Glucose, capillary     Status: Abnormal   Collection Time: 04/11/23 11:45 AM  Result Value Ref Range   Glucose-Capillary 216 (H) 70 - 99 mg/dL    Comment: Glucose reference range applies only to samples taken after fasting for at least 8 hours.   Comment 1 Notify RN    Comment 2 Document in Chart   Microalbumin / creatinine urine ratio     Status: Abnormal   Collection Time: 04/16/23  2:04 PM  Result Value Ref Range   Microalb, Ur 2.8 (H) 0.0 - 1.9 mg/dL   Creatinine,U 130.8 mg/dL   Microalb Creat Ratio 1.6 0.0 - 30.0 mg/g  Hepatitis C Antibody     Status: None   Collection Time: 04/16/23  2:04 PM  Result Value Ref Range   Hepatitis C Ab NON-REACTIVE NON-REACTIVE    Comment: . HCV antibody was non-reactive. There is no laboratory  evidence of HCV infection. . In most cases, no further action is required.  However, if recent HCV exposure is suspected, a test for HCV RNA (test code 65784) is suggested. . For additional information please refer to http://education.[facility].com/faq/FAQ22v1 (This link is  being provided for informational/ educational purposes only.) .   Basic metabolic panel     Status: Abnormal   Collection Time: 05/12/23  7:20 PM  Result Value Ref Range   Sodium 129 (L) 135 - 145 mmol/L   Potassium 3.9 3.5 - 5.1 mmol/L   Chloride 94 (L) 98 - 111 mmol/L   CO2 23 22 - 32 mmol/L   Glucose, Bld 482 (H) 70 - 99 mg/dL    Comment: Glucose reference range applies only to samples taken after fasting for at least 8 hours.   BUN 16 8 - 23 mg/dL   Creatinine, Ser 7.82 (H) 0.44 - 1.00 mg/dL   Calcium 8.6 (L) 8.9 - 10.3 mg/dL   GFR, Estimated 32 (L) >60 mL/min    Comment: (NOTE) Calculated using the CKD-EPI Creatinine Equation (2021)    Anion gap 12 5 - 15    Comment: Performed at [facility], [address], [place], [place] [address]  CBC     Status: Abnormal   Collection Time: 05/12/23  7:20 PM  Result Value Ref Range   WBC 12.3 (H) 4.0 - 10.5 K/uL   RBC 3.61 (L) 3.87 - 5.11 MIL/uL   Hemoglobin 9.8 (L) 12.0 - 15.0 g/dL   HCT 30.8 (L) 65.7 - 84.6 %   MCV 86.1 80.0 - 100.0 fL   MCH 27.1 26.0 - 34.0 pg   MCHC 31.5 30.0 - 36.0 g/dL   RDW 96.2 95.2 - 84.1 %   Platelets 231 150 - 400 K/uL   nRBC 0.0 0.0 - 0.2 %    Comment: Performed at [facility], [address], [place], [place] [address]  CBG monitoring, ED     Status: Abnormal   Collection Time: 05/12/23  7:55 PM  Result Value Ref Range   Glucose-Capillary 429 (H) 70 - 99 mg/dL    Comment: Glucose reference range applies only to samples taken after fasting for at least 8 hours.  Lactic acid, plasma     Status: None   Collection Time: 05/12/23  8:21 PM  Result Value Ref Range   Lactic Acid, Venous 1.8 0.5 - 1.9 mmol/L    Comment: Performed at [facility], [address], [place], [place] [address]  Blood culture (routine x 2)     Status: None   Collection Time: 05/12/23  8:21 PM   Specimen: BLOOD  Result Value Ref Range   Specimen Description      BLOOD LEFT ANTECUBITAL Performed at [facility], [address], [place], [place] [address]    Special Requests      BOTTLES DRAWN AEROBIC AND ANAEROBIC Blood Culture adequate volume Performed at [facility], [address] [place], [place] [address]    Culture      NO GROWTH 5 DAYS Performed at [facility], [address], [place], [place] [address]    Report Status 05/17/2023 FINAL   Sedimentation rate     Status: Abnormal   Collection Time: 05/12/23  8:21 PM  Result Value Ref Range   Sed Rate 61 (H) 0 - 22 mm/hr    Comment: Performed at [facility], [address], [place], [place] [address]  C-reactive protein     Status: Abnormal   Collection Time: 05/12/23  8:21 PM  Result Value Ref Range   CRP 4.9 (H) <1.0 mg/dL    Comment: Performed at [facility],  [address], [place], [place] [address]  Brain natriuretic peptide     Status: Abnormal   Collection Time: 05/12/23  8:22 PM  Result Value Ref Range   B Natriuretic Peptide 214.7 (H) 0.0 - 100.0 pg/mL    Comment: Performed at [facility], [address] [place], [place] [address]  Blood culture (routine x 2)     Status: Abnormal   Collection Time: 05/12/23  8:42 PM   Specimen: BLOOD  Result Value Ref Range   Specimen Description      BLOOD RIGHT ANTECUBITAL Performed at [facility], [address], [place], [place] [address]    Special Requests      BOTTLES DRAWN AEROBIC AND ANAEROBIC Blood Culture adequate volume Performed at [facility], [address], [place], [place] [address]    Culture  Setup Time      GRAM POSITIVE COCCI IN CHAINS AEROBIC BOTTLE ONLY CRITICAL RESULT  CALLED TO, READ BACK BY AND VERIFIED WITH: PHARMD J. LEDFORD 05/14/23 @ 0425 BY AB Performed at [facility], [address] [place], [place] [address]    Culture (A)     STREPTOCOCCUS CONSTELLATUS STREPTOCOCCI, BETA HEMOLYTIC    Report Status 05/16/2023 FINAL    Organism ID, Bacteria STREPTOCOCCUS CONSTELLATUS       Susceptibility   Streptococcus constellatus - MIC*    PENICILLIN <=0.06 SENSITIVE Sensitive     CEFTRIAXONE <=0.12 SENSITIVE Sensitive     ERYTHROMYCIN <=0.12 SENSITIVE Sensitive     LEVOFLOXACIN 0.5 SENSITIVE Sensitive     VANCOMYCIN 0.5 SENSITIVE Sensitive     * STREPTOCOCCUS CONSTELLATUS  Blood Culture ID Panel (Reflexed)     Status: Abnormal   Collection Time: 05/12/23  8:42 PM  Result Value Ref Range   Enterococcus faecalis NOT DETECTED NOT DETECTED   Enterococcus Faecium NOT DETECTED NOT DETECTED   Listeria monocytogenes NOT DETECTED NOT DETECTED   Staphylococcus species NOT DETECTED NOT DETECTED   Staphylococcus aureus (BCID) NOT DETECTED NOT DETECTED   Staphylococcus epidermidis NOT DETECTED NOT DETECTED   Staphylococcus lugdunensis NOT DETECTED NOT DETECTED   Streptococcus species DETECTED (A) NOT DETECTED    Comment: Not Enterococcus species, Streptococcus agalactiae, Streptococcus pyogenes, or Streptococcus pneumoniae. CRITICAL RESULT CALLED TO, READ BACK BY AND VERIFIED WITH: PHARMD J. LEDFORD 05/14/23 @ 0425 BY AB    Streptococcus agalactiae NOT DETECTED NOT DETECTED   Streptococcus pneumoniae NOT DETECTED NOT DETECTED   Streptococcus pyogenes NOT DETECTED NOT DETECTED   A.calcoaceticus-baumannii NOT DETECTED NOT DETECTED   Bacteroides fragilis NOT DETECTED NOT DETECTED   Enterobacterales NOT DETECTED NOT DETECTED   Enterobacter cloacae complex NOT DETECTED NOT DETECTED   Escherichia coli NOT DETECTED NOT DETECTED   Klebsiella aerogenes NOT DETECTED NOT DETECTED   Klebsiella oxytoca NOT DETECTED NOT DETECTED   Klebsiella pneumoniae NOT DETECTED  NOT DETECTED   Proteus species NOT DETECTED NOT DETECTED   Salmonella species NOT DETECTED NOT DETECTED   Serratia marcescens NOT DETECTED NOT DETECTED   Haemophilus influenzae NOT DETECTED NOT DETECTED   Neisseria meningitidis NOT DETECTED NOT DETECTED   Pseudomonas aeruginosa NOT DETECTED NOT DETECTED   Stenotrophomonas maltophilia NOT DETECTED NOT DETECTED   Candida albicans NOT DETECTED NOT DETECTED   Candida auris NOT DETECTED NOT DETECTED   Candida glabrata NOT DETECTED NOT DETECTED   Candida krusei NOT DETECTED NOT DETECTED   Candida parapsilosis NOT DETECTED NOT DETECTED   Candida tropicalis NOT DETECTED NOT DETECTED  Cryptococcus neoformans/gattii NOT DETECTED NOT DETECTED    Comment: Performed at [facility], [address], [place], [place] [address]  Wet prep, genital     Status: None   Collection Time: 05/12/23  9:47 PM  Result Value Ref Range   Yeast Wet Prep HPF POC NONE SEEN NONE SEEN   Trich, Wet Prep NONE SEEN NONE SEEN   Clue Cells Wet Prep HPF POC NONE SEEN NONE SEEN   WBC, Wet Prep HPF POC <10 <10   Sperm NONE SEEN     Comment: Performed at [facility], [address], [place], [place] [address]  CBG monitoring, ED     Status: Abnormal   Collection Time: 05/12/23 10:52 PM  Result Value Ref Range   Glucose-Capillary 380 (H) 70 - 99 mg/dL    Comment: Glucose reference range applies only to samples taken after fasting for at least 8 hours.  Urinalysis, Routine w reflex microscopic -Urine, Clean Catch     Status: Abnormal   Collection Time: 05/13/23 12:55 AM  Result Value Ref Range   Color, Urine YELLOW YELLOW   APPearance CLEAR CLEAR   Specific Gravity, Urine 1.014 1.005 - 1.030   pH 6.0 5.0 - 8.0   Glucose, UA >=500 (A) NEGATIVE mg/dL   Hgb urine dipstick MODERATE (A) NEGATIVE   Bilirubin Urine NEGATIVE NEGATIVE   Ketones, ur 5 (A) NEGATIVE mg/dL   Protein, ur 30 (A) NEGATIVE mg/dL   Nitrite NEGATIVE NEGATIVE   Leukocytes,Ua  TRACE (A) NEGATIVE   RBC / HPF 6-10 0 - 5 RBC/hpf   WBC, UA 6-10 0 - 5 WBC/hpf   Bacteria, UA RARE (A) NONE SEEN   Squamous Epithelial / HPF 0-5 0 - 5 /HPF    Comment: Performed at [facility], [address], [place], [place] [address]  CBG monitoring, ED     Status: Abnormal   Collection Time: 05/13/23  2:44 AM  Result Value Ref Range   Glucose-Capillary 337 (H) 70 - 99 mg/dL    Comment: Glucose reference range applies only to samples taken after fasting for at least 8 hours.  CBG monitoring, ED     Status: Abnormal   Collection Time: 05/13/23  4:23 AM  Result Value Ref Range   Glucose-Capillary 306 (H) 70 - 99 mg/dL    Comment: Glucose reference range applies only to samples taken after fasting for at least 8 hours.  Glucose, capillary     Status: Abnormal   Collection Time: 05/13/23  8:02 AM  Result Value Ref Range   Glucose-Capillary 267 (H) 70 - 99 mg/dL    Comment: Glucose reference range applies only to samples taken after fasting for at least 8 hours.  Magnesium     Status: None   Collection Time: 05/13/23 10:51 AM  Result Value Ref Range   Magnesium 1.7 1.7 - 2.4 mg/dL    Comment: Performed at [facility], [address], [place], [place] [address]  Protime-INR     Status: None   Collection Time: 05/13/23 10:51 AM  Result Value Ref Range   Prothrombin Time 15.1 11.4 - 15.2 seconds   INR 1.2 0.8 - 1.2    Comment: (NOTE) INR goal varies based on device and disease states. Performed at [facility], [address] [place], [place] [address]   CBC with Differential/Platelet     Status: Abnormal   Collection Time: 05/13/23 10:51 AM  Result Value  Ref Range   WBC 9.8 4.0 - 10.5 K/uL   RBC 3.27 (L) 3.87 - 5.11 MIL/uL   Hemoglobin 8.8 (L) 12.0 - 15.0 g/dL   HCT 81.1 (L) 91.4 - 78.2 %   MCV 86.2 80.0 - 100.0 fL   MCH 26.9 26.0 - 34.0 pg   MCHC 31.2 30.0 - 36.0 g/dL   RDW 95.6 21.3 - 08.6 %   Platelets 200 150 -  400 K/uL   nRBC 0.0 0.0 - 0.2 %   Neutrophils Relative % 84 %   Neutro Abs 8.3 (H) 1.7 - 7.7 K/uL   Lymphocytes Relative 8 %   Lymphs Abs 0.8 0.7 - 4.0 K/uL   Monocytes Relative 7 %   Monocytes Absolute 0.7 0.1 - 1.0 K/uL   Eosinophils Relative 1 %   Eosinophils Absolute 0.1 0.0 - 0.5 K/uL   Basophils Relative 0 %   Basophils Absolute 0.0 0.0 - 0.1 K/uL   Immature Granulocytes 0 %   Abs Immature Granulocytes 0.04 0.00 - 0.07 K/uL    Comment: Performed at [facility], [address] [place], [place] [address]  Comprehensive metabolic panel     Status: Abnormal   Collection Time: 05/13/23 10:51 AM  Result Value Ref Range   Sodium 132 (L) 135 - 145 mmol/L   Potassium 3.5 3.5 - 5.1 mmol/L   Chloride 99 98 - 111 mmol/L   CO2 25 22 - 32 mmol/L   Glucose, Bld 317 (H) 70 - 99 mg/dL    Comment: Glucose reference range applies only to samples taken after fasting for at least 8 hours.   BUN 15 8 - 23 mg/dL   Creatinine, Ser 9.62 (H) 0.44 - 1.00 mg/dL   Calcium 8.7 (L) 8.9 - 10.3 mg/dL   Total Protein 6.4 (L) 6.5 - 8.1 g/dL   Albumin 2.4 (L) 3.5 - 5.0 g/dL   AST 13 (L) 15 - 41 U/L   ALT 11 0 - 44 U/L   Alkaline Phosphatase 107 38 - 126 U/L   Total Bilirubin 0.9 0.3 - 1.2 mg/dL   GFR, Estimated 43 (L) >60 mL/min    Comment: (NOTE) Calculated using the CKD-EPI Creatinine Equation (2021)    Anion gap 8 5 - 15    Comment: Performed at [facility], [address] [place], [place] [address]  Hemoglobin A1c     Status: Abnormal   Collection Time: 05/13/23 10:51 AM  Result Value Ref Range   Hgb A1c MFr Bld 12.2 (H) 4.8 - 5.6 %    Comment: (NOTE)         Prediabetes: 5.7 - 6.4         Diabetes: >6.4         Glycemic control for adults with diabetes: <7.0    Mean Plasma Glucose 303 mg/dL    Comment: (NOTE) Performed At: [facility] [address] [place], [place] [id number] Jolene Schimke MD VO:[id number]   Glucose, capillary      Status: Abnormal   Collection Time: 05/13/23  1:25 PM  Result Value Ref Range   Glucose-Capillary 306 (H) 70 - 99 mg/dL    Comment: Glucose reference range applies only to samples taken after fasting for at least 8 hours.  Glucose, capillary     Status: Abnormal   Collection Time: 05/13/23  4:22 PM  Result Value Ref Range   Glucose-Capillary 291 (H) 70 - 99 mg/dL    Comment: Glucose reference range  applies only to samples taken after fasting for at least 8 hours.  Glucose, capillary     Status: Abnormal   Collection Time: 05/13/23  9:18 PM  Result Value Ref Range   Glucose-Capillary 214 (H) 70 - 99 mg/dL    Comment: Glucose reference range applies only to samples taken after fasting for at least 8 hours.  CBC     Status: Abnormal   Collection Time: 05/14/23  1:27 AM  Result Value Ref Range   WBC 9.1 4.0 - 10.5 K/uL   RBC 3.46 (L) 3.87 - 5.11 MIL/uL   Hemoglobin 9.1 (L) 12.0 - 15.0 g/dL   HCT 32.9 (L) 51.8 - 84.1 %   MCV 85.0 80.0 - 100.0 fL   MCH 26.3 26.0 - 34.0 pg   MCHC 31.0 30.0 - 36.0 g/dL   RDW 66.0 63.0 - 16.0 %   Platelets 211 150 - 400 K/uL   nRBC 0.0 0.0 - 0.2 %    Comment: Performed at [facility], [address], [place], [place] [address]  Basic metabolic panel     Status: Abnormal   Collection Time: 05/14/23  1:27 AM  Result Value Ref Range   Sodium 133 (L) 135 - 145 mmol/L   Potassium 3.3 (L) 3.5 - 5.1 mmol/L   Chloride 99 98 - 111 mmol/L   CO2 24 22 - 32 mmol/L   Glucose, Bld 213 (H) 70 - 99 mg/dL    Comment: Glucose reference range applies only to samples taken after fasting for at least 8 hours.   BUN 13 8 - 23 mg/dL   Creatinine, Ser 3.55 (H) 0.44 - 1.00 mg/dL   Calcium 8.5 (L) 8.9 - 10.3 mg/dL   GFR, Estimated 44 (L) >60 mL/min    Comment: (NOTE) Calculated using the CKD-EPI Creatinine Equation (2021)    Anion gap 10 5 - 15    Comment: Performed at [facility], [address] [place], [place] [address]  Glucose, capillary     Status:  Abnormal   Collection Time: 05/14/23  6:10 AM  Result Value Ref Range   Glucose-Capillary 218 (H) 70 - 99 mg/dL    Comment: Glucose reference range applies only to samples taken after fasting for at least 8 hours.  Glucose, capillary     Status: Abnormal   Collection Time: 05/14/23 12:20 PM  Result Value Ref Range   Glucose-Capillary 303 (H) 70 - 99 mg/dL    Comment: Glucose reference range applies only to samples taken after fasting for at least 8 hours.   Comment 1 Notify RN    Comment 2 Document in Chart   ECHOCARDIOGRAM COMPLETE     Status: None   Collection Time: 05/14/23  2:47 PM  Result Value Ref Range   Weight 3,545 oz   Height 67 in   BP 141/80 mmHg   S' Lateral 2.80 cm   AR max vel 2.93 cm2   AV Peak grad 10.1 mmHg   Ao pk vel 1.59 m/s   Area-P 1/2 3.65 cm2   Est EF 60 - 65%   Glucose, capillary     Status: Abnormal   Collection Time: 05/14/23  4:47 PM  Result Value Ref Range   Glucose-Capillary 222 (H) 70 - 99 mg/dL    Comment: Glucose reference range applies only to samples taken after fasting for at least 8 hours.   Comment 1 Notify RN    Comment 2 Document in Chart  Glucose, capillary     Status: Abnormal   Collection Time: 05/14/23  8:37 PM  Result Value Ref Range   Glucose-Capillary 282 (H) 70 - 99 mg/dL    Comment: Glucose reference range applies only to samples taken after fasting for at least 8 hours.  CBC     Status: Abnormal   Collection Time: 05/15/23  1:13 AM  Result Value Ref Range   WBC 7.6 4.0 - 10.5 K/uL   RBC 3.46 (L) 3.87 - 5.11 MIL/uL   Hemoglobin 9.4 (L) 12.0 - 15.0 g/dL   HCT 57.3 (L) 22.0 - 25.4 %   MCV 86.7 80.0 - 100.0 fL   MCH 27.2 26.0 - 34.0 pg   MCHC 31.3 30.0 - 36.0 g/dL   RDW 27.0 62.3 - 76.2 %   Platelets 228 150 - 400 K/uL   nRBC 0.0 0.0 - 0.2 %    Comment: Performed at [facility], [address], [place], [place] [address]  Basic metabolic panel     Status: Abnormal   Collection Time: 05/15/23  1:13 AM  Result  Value Ref Range   Sodium 136 135 - 145 mmol/L   Potassium 3.6 3.5 - 5.1 mmol/L   Chloride 101 98 - 111 mmol/L   CO2 25 22 - 32 mmol/L   Glucose, Bld 270 (H) 70 - 99 mg/dL    Comment: Glucose reference range applies only to samples taken after fasting for at least 8 hours.   BUN 16 8 - 23 mg/dL   Creatinine, Ser 7.61 (H) 0.44 - 1.00 mg/dL   Calcium 8.7 (L) 8.9 - 10.3 mg/dL   GFR, Estimated 36 (L) >60 mL/min    Comment: (NOTE) Calculated using the CKD-EPI Creatinine Equation (2021)    Anion gap 10 5 - 15    Comment: Performed at [facility], [address], [place], [place] [address]  Magnesium     Status: None   Collection Time: 05/15/23  1:13 AM  Result Value Ref Range   Magnesium 1.7 1.7 - 2.4 mg/dL    Comment: Performed at [facility], [address] [place], [place] [address]  Folate     Status: None   Collection Time: 05/15/23  1:13 AM  Result Value Ref Range   Folate 16.8 >5.9 ng/mL    Comment: Performed at [facility], [address], [place], [place] [address]  Glucose, capillary     Status: Abnormal   Collection Time: 05/15/23  6:10 AM  Result Value Ref Range   Glucose-Capillary 249 (H) 70 - 99 mg/dL    Comment: Glucose reference range applies only to samples taken after fasting for at least 8 hours.  Glucose, capillary     Status: Abnormal   Collection Time: 05/15/23  7:28 AM  Result Value Ref Range   Glucose-Capillary 266 (H) 70 - 99 mg/dL    Comment: Glucose reference range applies only to samples taken after fasting for at least 8 hours.  Glucose, capillary     Status: Abnormal   Collection Time: 05/15/23 11:16 AM  Result Value Ref Range   Glucose-Capillary 320 (H) 70 - 99 mg/dL    Comment: Glucose reference range applies only to samples taken after fasting for at least 8 hours.  Glucose, capillary     Status: Abnormal   Collection Time: 05/15/23  4:00 PM  Result Value Ref Range   Glucose-Capillary 347 (H) 70 - 99 mg/dL  Comment:  Glucose reference range applies only to samples taken after fasting for at least 8 hours.  Glucose, capillary     Status: Abnormal   Collection Time: 05/15/23  8:34 PM  Result Value Ref Range   Glucose-Capillary 265 (H) 70 - 99 mg/dL    Comment: Glucose reference range applies only to samples taken after fasting for at least 8 hours.  CBC     Status: Abnormal   Collection Time: 05/16/23 12:44 AM  Result Value Ref Range   WBC 8.0 4.0 - 10.5 K/uL   RBC 3.21 (L) 3.87 - 5.11 MIL/uL   Hemoglobin 8.7 (L) 12.0 - 15.0 g/dL   HCT 32.4 (L) 40.1 - 02.7 %   MCV 87.9 80.0 - 100.0 fL   MCH 27.1 26.0 - 34.0 pg   MCHC 30.9 30.0 - 36.0 g/dL   RDW 25.3 66.4 - 40.3 %   Platelets 213 150 - 400 K/uL   nRBC 0.0 0.0 - 0.2 %    Comment: Performed at [facility], [address] [place], [place] [address]  Basic metabolic panel     Status: Abnormal   Collection Time: 05/16/23 12:44 AM  Result Value Ref Range   Sodium 134 (L) 135 - 145 mmol/L   Potassium 3.6 3.5 - 5.1 mmol/L   Chloride 103 98 - 111 mmol/L   CO2 23 22 - 32 mmol/L   Glucose, Bld 299 (H) 70 - 99 mg/dL    Comment: Glucose reference range applies only to samples taken after fasting for at least 8 hours.   BUN 19 8 - 23 mg/dL   Creatinine, Ser 9.56 (H) 0.44 - 1.00 mg/dL   Calcium 8.5 (L) 8.9 - 10.3 mg/dL   GFR, Estimated 35 (L) >60 mL/min    Comment: (NOTE) Calculated using the CKD-EPI Creatinine Equation (2021)    Anion gap 8 5 - 15    Comment: Performed at [facility], [address] [place], [place] [address]  Magnesium     Status: None   Collection Time: 05/16/23 12:44 AM  Result Value Ref Range   Magnesium 1.8 1.7 - 2.4 mg/dL    Comment: Performed at [facility], [address], [place], [place] [address]  Glucose, capillary     Status: Abnormal   Collection Time: 05/16/23  5:58 AM  Result Value Ref Range   Glucose-Capillary 293 (H) 70 - 99 mg/dL    Comment: Glucose reference range applies only to samples  taken after fasting for at least 8 hours.   Comment 1 Notify RN    Comment 2 Document in Chart   Vancomycin, trough     Status: Abnormal   Collection Time: 05/16/23 10:53 AM  Result Value Ref Range   Vancomycin Tr 12 (L) 15 - 20 ug/mL    Comment: Performed at [facility], [address] [place], [place] [address]  Glucose, capillary     Status: Abnormal   Collection Time: 05/16/23 11:26 AM  Result Value Ref Range   Glucose-Capillary 310 (H) 70 - 99 mg/dL    Comment: Glucose reference range applies only to samples taken after fasting for at least 8 hours.  Vancomycin, peak     Status: Abnormal   Collection Time: 05/16/23  1:59 PM  Result Value Ref Range   Vancomycin Pk 10 (L) 30 - 40 ug/mL    Comment: Performed at [facility], [address] [place], [place]  [address]  Glucose, capillary     Status: Abnormal   Collection Time: 05/16/23  4:21 PM  Result Value Ref Range   Glucose-Capillary 195 (H) 70 - 99 mg/dL    Comment: Glucose reference range applies only to samples taken after fasting for at least 8 hours.  Glucose, capillary     Status: Abnormal   Collection Time: 05/16/23  9:05 PM  Result Value Ref Range   Glucose-Capillary 262 (H) 70 - 99 mg/dL    Comment: Glucose reference range applies only to samples taken after fasting for at least 8 hours.  Basic metabolic panel     Status: Abnormal   Collection Time: 05/17/23 12:40 AM  Result Value Ref Range   Sodium 137 135 - 145 mmol/L   Potassium 3.7 3.5 - 5.1 mmol/L   Chloride 109 98 - 111 mmol/L   CO2 20 (L) 22 - 32 mmol/L   Glucose, Bld 298 (H) 70 - 99 mg/dL    Comment: Glucose reference range applies only to samples taken after fasting for at least 8 hours.   BUN 17 8 - 23 mg/dL   Creatinine, Ser 6.04 (H) 0.44 - 1.00 mg/dL   Calcium 8.6 (L) 8.9 - 10.3 mg/dL   GFR, Estimated 42 (L) >60 mL/min    Comment: (NOTE) Calculated using the CKD-EPI Creatinine Equation (2021)    Anion gap 8 5 - 15    Comment:  Performed at [facility], [address] [place], [place] [address]  Magnesium     Status: None   Collection Time: 05/17/23 12:40 AM  Result Value Ref Range   Magnesium 1.8 1.7 - 2.4 mg/dL    Comment: Performed at [facility], [address] [place], [place] [address]  CBC     Status: Abnormal   Collection Time: 05/17/23 12:40 AM  Result Value Ref Range   WBC 7.4 4.0 - 10.5 K/uL   RBC 3.17 (L) 3.87 - 5.11 MIL/uL   Hemoglobin 8.6 (L) 12.0 - 15.0 g/dL   HCT 78.2 (L) 95.6 - 21.3 %   MCV 88.3 80.0 - 100.0 fL   MCH 27.1 26.0 - 34.0 pg   MCHC 30.7 30.0 - 36.0 g/dL   RDW 08.6 57.8 - 46.9 %   Platelets 198 150 - 400 K/uL   nRBC 0.0 0.0 - 0.2 %    Comment: Performed at [facility], [address], [place], [place] [address]  Lipid panel     Status: Abnormal   Collection Time: 05/17/23 12:40 AM  Result Value Ref Range   Cholesterol 107 0 - 200 mg/dL   Triglycerides 841 <324 mg/dL   HDL 35 (L) >40 mg/dL   Total CHOL/HDL Ratio 3.1 RATIO   VLDL 22 0 - 40 mg/dL   LDL Cholesterol 50 0 - 99 mg/dL    Comment:        Total Cholesterol/HDL:CHD Risk Coronary Heart Disease Risk Table                     Men   Women  1/2 Average Risk   3.4   3.3  Average Risk       5.0   4.4  2 X Average Risk   9.6   7.1  3 X Average Risk  23.4   11.0        Use the calculated Patient Ratio above and the CHD Risk Table to determine the patient's CHD  Risk.        ATP III CLASSIFICATION (LDL):  <100     mg/dL   Optimal  102-725  mg/dL   Near or Above                    Optimal  130-159  mg/dL   Borderline  366-440  mg/dL   High  >347     mg/dL   Very High Performed at [facility], [address] [place], [place] [address]   Glucose, capillary     Status: Abnormal   Collection Time: 05/17/23  6:27 AM  Result Value Ref Range   Glucose-Capillary 267 (H) 70 - 99 mg/dL    Comment: Glucose reference range applies only to samples taken after fasting for at least 8 hours.   Glucose, capillary     Status: Abnormal   Collection Time: 05/17/23 11:10 AM  Result Value Ref Range   Glucose-Capillary 306 (H) 70 - 99 mg/dL    Comment: Glucose reference range applies only to samples taken after fasting for at least 8 hours.  Glucose, capillary     Status: Abnormal   Collection Time: 05/17/23  4:30 PM  Result Value Ref Range   Glucose-Capillary 277 (H) 70 - 99 mg/dL    Comment: Glucose reference range applies only to samples taken after fasting for at least 8 hours.   Comment 1 Notify RN   Glucose, capillary     Status: Abnormal   Collection Time: 05/17/23  9:21 PM  Result Value Ref Range   Glucose-Capillary 218 (H) 70 - 99 mg/dL    Comment: Glucose reference range applies only to samples taken after fasting for at least 8 hours.   Comment 1 Notify RN    Comment 2 Document in Chart   Basic metabolic panel     Status: Abnormal   Collection Time: 05/18/23  1:23 AM  Result Value Ref Range   Sodium 138 135 - 145 mmol/L   Potassium 3.6 3.5 - 5.1 mmol/L   Chloride 109 98 - 111 mmol/L   CO2 23 22 - 32 mmol/L   Glucose, Bld 202 (H) 70 - 99 mg/dL    Comment: Glucose reference range applies only to samples taken after fasting for at least 8 hours.   BUN 15 8 - 23 mg/dL   Creatinine, Ser 6.38 (H) 0.44 - 1.00 mg/dL   Calcium 8.8 (L) 8.9 - 10.3 mg/dL   GFR, Estimated 43 (L) >60 mL/min    Comment: (NOTE) Calculated using the CKD-EPI Creatinine Equation (2021)    Anion gap 6 5 - 15    Comment: Performed at [facility], [address], [place], [place] [address]  Magnesium     Status: None   Collection Time: 05/18/23  1:23 AM  Result Value Ref Range   Magnesium 1.9 1.7 - 2.4 mg/dL    Comment: Performed at [facility], [address] [place], [place] [address]  CBC     Status: Abnormal   Collection Time: 05/18/23  1:23 AM  Result Value Ref Range   WBC 7.9 4.0 - 10.5 K/uL   RBC 3.25 (L) 3.87 - 5.11 MIL/uL   Hemoglobin 8.8 (L) 12.0 - 15.0 g/dL   HCT  88.4 (L) 16.6 - 46.0 %   MCV 87.7 80.0 - 100.0 fL   MCH 27.1 26.0 - 34.0 pg   MCHC 30.9 30.0 - 36.0 g/dL   RDW 06.3 (  H) 11.5 - 15.5 %   Platelets 211 150 - 400 K/uL   nRBC 0.0 0.0 - 0.2 %    Comment: Performed at [facility], [address] [place], [place] [address]  Glucose, capillary     Status: Abnormal   Collection Time: 05/18/23  6:10 AM  Result Value Ref Range   Glucose-Capillary 212 (H) 70 - 99 mg/dL    Comment: Glucose reference range applies only to samples taken after fasting for at least 8 hours.   Comment 1 Notify RN    Comment 2 Document in Chart   Surgical pcr screen     Status: Abnormal   Collection Time: 05/18/23  6:23 AM   Specimen: Nasal Mucosa; Nasal Swab  Result Value Ref Range   MRSA, PCR NEGATIVE NEGATIVE   Staphylococcus aureus POSITIVE (A) NEGATIVE    Comment: (NOTE) The Xpert SA Assay (FDA approved for NASAL specimens in patients 67 years of age and older), is one component of a comprehensive surveillance program. It is not intended to diagnose infection nor to guide or monitor treatment. Performed at [facility], [address], [place], [place] [address]   Glucose, capillary     Status: Abnormal   Collection Time: 05/18/23 10:59 AM  Result Value Ref Range   Glucose-Capillary 200 (H) 70 - 99 mg/dL    Comment: Glucose reference range applies only to samples taken after fasting for at least 8 hours.  Glucose, capillary     Status: Abnormal   Collection Time: 05/18/23 12:31 PM  Result Value Ref Range   Glucose-Capillary 200 (H) 70 - 99 mg/dL    Comment: Glucose reference range applies only to samples taken after fasting for at least 8 hours.  Glucose, capillary     Status: Abnormal   Collection Time: 05/18/23  1:31 PM  Result Value Ref Range   Glucose-Capillary 200 (H) 70 - 99 mg/dL    Comment: Glucose reference range applies only to samples taken after fasting for at least 8 hours.  Glucose, capillary     Status: Abnormal    Collection Time: 05/18/23  2:22 PM  Result Value Ref Range   Glucose-Capillary 171 (H) 70 - 99 mg/dL    Comment: Glucose reference range applies only to samples taken after fasting for at least 8 hours.   Comment 1 Notify RN   Glucose, capillary     Status: Abnormal   Collection Time: 05/18/23  4:24 PM  Result Value Ref Range   Glucose-Capillary 179 (H) 70 - 99 mg/dL    Comment: Glucose reference range applies only to samples taken after fasting for at least 8 hours.   Comment 1 Notify RN   Glucose, capillary     Status: Abnormal   Collection Time: 05/18/23  9:04 PM  Result Value Ref Range   Glucose-Capillary 236 (H) 70 - 99 mg/dL    Comment: Glucose reference range applies only to samples taken after fasting for at least 8 hours.   Comment 1 Notify RN    Comment 2 Document in Chart   Basic metabolic panel     Status: Abnormal   Collection Time: 05/19/23  1:00 AM  Result Value Ref Range   Sodium 138 135 - 145 mmol/L   Potassium 4.1 3.5 - 5.1 mmol/L   Chloride 109 98 - 111 mmol/L   CO2 22 22 - 32 mmol/L   Glucose, Bld 271 (H) 70 - 99 mg/dL    Comment:  Glucose reference range applies only to samples taken after fasting for at least 8 hours.   BUN 13 8 - 23 mg/dL   Creatinine, Ser 0.34 (H) 0.44 - 1.00 mg/dL   Calcium 8.9 8.9 - 74.2 mg/dL   GFR, Estimated 48 (L) >60 mL/min    Comment: (NOTE) Calculated using the CKD-EPI Creatinine Equation (2021)    Anion gap 7 5 - 15    Comment: Performed at [facility], [address] [place], [place] [address]  Magnesium     Status: None   Collection Time: 05/19/23  1:00 AM  Result Value Ref Range   Magnesium 1.9 1.7 - 2.4 mg/dL    Comment: Performed at [facility], [address], [place], [place] [address]  CBC     Status: Abnormal   Collection Time: 05/19/23  1:00 AM  Result Value Ref Range   WBC 9.3 4.0 - 10.5 K/uL   RBC 3.34 (L) 3.87 - 5.11 MIL/uL   Hemoglobin 8.9 (L) 12.0 - 15.0 g/dL   HCT 33.2 (L) 95.1 - 88.4 %    MCV 86.2 80.0 - 100.0 fL   MCH 26.6 26.0 - 34.0 pg   MCHC 30.9 30.0 - 36.0 g/dL   RDW 16.6 (H) 06.3 - 01.6 %   Platelets 226 150 - 400 K/uL   nRBC 0.0 0.0 - 0.2 %    Comment: Performed at [facility], [address], [place], [place] [address]  Glucose, capillary     Status: Abnormal   Collection Time: 05/19/23  6:39 AM  Result Value Ref Range   Glucose-Capillary 234 (H) 70 - 99 mg/dL    Comment: Glucose reference range applies only to samples taken after fasting for at least 8 hours.   Comment 1 Notify RN    Comment 2 Document in Chart   Glucose, capillary     Status: Abnormal   Collection Time: 05/19/23 11:11 AM  Result Value Ref Range   Glucose-Capillary 237 (H) 70 - 99 mg/dL    Comment: Glucose reference range applies only to samples taken after fasting for at least 8 hours.  Glucose, capillary     Status: Abnormal   Collection Time: 05/19/23  3:40 PM  Result Value Ref Range   Glucose-Capillary 240 (H) 70 - 99 mg/dL    Comment: Glucose reference range applies only to samples taken after fasting for at least 8 hours.  Glucose, capillary     Status: Abnormal   Collection Time: 05/19/23  9:47 PM  Result Value Ref Range   Glucose-Capillary 223 (H) 70 - 99 mg/dL    Comment: Glucose reference range applies only to samples taken after fasting for at least 8 hours.   Comment 1 Notify RN    Comment 2 Document in Chart   Basic metabolic panel     Status: Abnormal   Collection Time: 05/20/23  1:23 AM  Result Value Ref Range   Sodium 134 (L) 135 - 145 mmol/L   Potassium 3.8 3.5 - 5.1 mmol/L   Chloride 106 98 - 111 mmol/L   CO2 21 (L) 22 - 32 mmol/L   Glucose, Bld 202 (H) 70 - 99 mg/dL    Comment: Glucose reference range applies only to samples taken after fasting for at least 8 hours.   BUN 13 8 - 23 mg/dL   Creatinine, Ser 2.35 (H) 0.44 - 1.00 mg/dL   Calcium 8.9 8.9 - 57.3 mg/dL   GFR,  Estimated 50 (L) >60 mL/min    Comment: (NOTE) Calculated using the CKD-EPI Creatinine  Equation (2021)    Anion gap 7 5 - 15    Comment: Performed at [facility], [address], [place], [place] [address]  Magnesium     Status: None   Collection Time: 05/20/23  1:23 AM  Result Value Ref Range   Magnesium 1.9 1.7 - 2.4 mg/dL    Comment: Performed at [facility], [address], [place], [place] [address]  CBC     Status: Abnormal   Collection Time: 05/20/23  1:23 AM  Result Value Ref Range   WBC 9.3 4.0 - 10.5 K/uL   RBC 3.34 (L) 3.87 - 5.11 MIL/uL   Hemoglobin 9.1 (L) 12.0 - 15.0 g/dL   HCT 19.1 (L) 47.8 - 29.5 %   MCV 87.7 80.0 - 100.0 fL   MCH 27.2 26.0 - 34.0 pg   MCHC 31.1 30.0 - 36.0 g/dL   RDW 62.1 (H) 30.8 - 65.7 %   Platelets 236 150 - 400 K/uL   nRBC 0.0 0.0 - 0.2 %    Comment: Performed at [facility], [address] [place], [place] [address]  Glucose, capillary     Status: Abnormal   Collection Time: 05/20/23  6:19 AM  Result Value Ref Range   Glucose-Capillary 142 (H) 70 - 99 mg/dL    Comment: Glucose reference range applies only to samples taken after fasting for at least 8 hours.   Comment 1 Notify RN    Comment 2 Document in Chart   Glucose, capillary     Status: Abnormal   Collection Time: 05/20/23 10:54 AM  Result Value Ref Range   Glucose-Capillary 178 (H) 70 - 99 mg/dL    Comment: Glucose reference range applies only to samples taken after fasting for at least 8 hours.  Glucose, capillary     Status: Abnormal   Collection Time: 05/20/23  4:52 PM  Result Value Ref Range   Glucose-Capillary 206 (H) 70 - 99 mg/dL    Comment: Glucose reference range applies only to samples taken after fasting for at least 8 hours.  Glucose, capillary     Status: Abnormal   Collection Time: 05/20/23  8:55 PM  Result Value Ref Range   Glucose-Capillary 182 (H) 70 - 99 mg/dL    Comment: Glucose reference range applies only to samples taken after fasting for at least 8 hours.  Basic metabolic panel     Status: Abnormal   Collection Time:  05/21/23  1:23 AM  Result Value Ref Range   Sodium 139 135 - 145 mmol/L   Potassium 4.2 3.5 - 5.1 mmol/L   Chloride 105 98 - 111 mmol/L   CO2 23 22 - 32 mmol/L   Glucose, Bld 193 (H) 70 - 99 mg/dL    Comment: Glucose reference range applies only to samples taken after fasting for at least 8 hours.   BUN 13 8 - 23 mg/dL   Creatinine, Ser 2.95 (H) 0.44 - 1.00 mg/dL   Calcium 9.5 8.9 - 28.4 mg/dL   GFR, Estimated 55 (L) >60 mL/min    Comment: (NOTE) Calculated using the CKD-EPI Creatinine Equation (2021)    Anion gap 11 5 - 15    Comment: Performed at [facility], [address], [place], [place] [address]  Magnesium     Status: None   Collection Time: 05/21/23  1:23 AM  Result Value Ref Range   Magnesium 2.1 1.7 - 2.4 mg/dL    Comment: Performed at [facility], [address] [place], [place] [address]  CBC     Status: Abnormal   Collection Time: 05/21/23  1:23 AM  Result Value Ref Range   WBC 8.3 4.0 - 10.5 K/uL   RBC 3.55 (L) 3.87 - 5.11 MIL/uL   Hemoglobin 9.7 (L) 12.0 - 15.0 g/dL   HCT 60.4 (L) 54.0 - 98.1 %   MCV 87.0 80.0 - 100.0 fL   MCH 27.3 26.0 - 34.0 pg   MCHC 31.4 30.0 - 36.0 g/dL   RDW 19.1 (H) 47.8 - 29.5 %   Platelets 277 150 - 400 K/uL   nRBC 0.0 0.0 - 0.2 %    Comment: Performed at [facility], [address], [place], [place] [address]  Glucose, capillary     Status: Abnormal   Collection Time: 05/21/23  5:50 AM  Result Value Ref Range   Glucose-Capillary 181 (H) 70 - 99 mg/dL    Comment: Glucose reference range applies only to samples taken after fasting for at least 8 hours.  Glucose, capillary     Status: Abnormal   Collection Time: 05/21/23 11:26 AM  Result Value Ref Range   Glucose-Capillary 198 (H) 70 - 99 mg/dL    Comment: Glucose reference range applies only to samples taken after fasting for at least 8 hours.  VAS [place] ABI WITH/WO TBI     Status: None   Collection Time: 06/01/23  7:59 AM  Result Value Ref Range   Right ABI  0.63    Left ABI 0.96   Cervicovaginal ancillary only     Status: Abnormal   Collection Time: 06/11/23 11:28 AM  Result Value Ref Range   Bacterial Vaginitis (gardnerella) Negative    Candida Vaginitis Positive (A)    Candida Glabrata Positive (A)    Comment      Normal Reference Range Bacterial Vaginosis - Negative   Comment Normal Reference Range Candida Species - Negative    Comment Normal Reference Range Candida Galbrata - Negative   POCT Glucose (Device for Home Use)     Status: Abnormal   Collection Time: 06/26/23 12:49 PM  Result Value Ref Range   Glucose Fasting, POC     POC Glucose 162 (A) 70 - 99 mg/dl        Garner Nash, MD, MS

## 2023-07-02 NOTE — Assessment & Plan Note (Signed)
Managed by endocrinologist. Refill test strips. Orders: Future visit with fasting metabolic panel , urinalysis, microalbumin/creatinine ratio and other restratification labs.

## 2023-07-02 NOTE — Patient Instructions (Addendum)
Blood Pressure Monitoring: Your blood pressure was slightly elevated today. Continue to monitor your blood pressure at home and record the readings.  Refills: A prescription refill for your test strips has been sent. You mentioned there is currently no stock, so please follow up with your pharmacy.  Upcoming Appointments: You have an OB/GYN appointment on August 25th, 2024. You are also due for a colonoscopy - consider scheduling this procedure.  Vaccinations: The tetanus, flu and COVID-19 vaccinations are recommended in the fall, even though you mentioned you don't want the flu shot.

## 2023-07-02 NOTE — Assessment & Plan Note (Signed)
Resolved status post Diflucan.  Continue to monitor.

## 2023-07-04 ENCOUNTER — Telehealth: Payer: Self-pay

## 2023-07-04 NOTE — Telephone Encounter (Signed)
[facility] is calling for clarification on the Octreotide.  They state that patient normally get brand and script was written for generic. Also it's normally filled for the pen not the vial.  They call back next week to verify if prescription is correct.   [phone]

## 2023-07-06 MED ORDER — OCTREOTIDE ACETATE 100 MCG/ML IJ SOLN: 100.0000 ug | Freq: Three times a day (TID) | INTRAMUSCULAR | 3 refills | Status: DC

## 2023-07-06 NOTE — Telephone Encounter (Signed)
Called and left a detailed voicemail for the pharmacy.  Also sent a new script with notes stating brand only and Pens only.   Danielle Schroeder

## 2023-07-11 ENCOUNTER — Other Ambulatory Visit: Payer: Self-pay

## 2023-07-11 DIAGNOSIS — E220 Acromegaly and pituitary gigantism: Secondary | ICD-10-CM

## 2023-07-11 MED ORDER — BASAGLAR KWIKPEN 100 UNIT/ML ~~LOC~~ SOPN
54.0000 [IU] | PEN_INJECTOR | Freq: Every day | SUBCUTANEOUS | 3 refills | Status: DC
Start: 2023-07-11 — End: 2024-09-03

## 2023-07-11 MED ORDER — PEN NEEDLES 31G X 5 MM MISC: 1.0000 | Freq: Four times a day (QID) | 3 refills | Status: DC

## 2023-07-11 MED ORDER — LEVOTHYROXINE SODIUM 88 MCG PO TABS: 88.0000 ug | ORAL_TABLET | Freq: Every day | ORAL | 3 refills | Status: DC

## 2023-07-11 MED ORDER — CABERGOLINE 0.5 MG PO TABS: 0.2500 mg | ORAL_TABLET | ORAL | 3 refills | Status: DC

## 2023-07-13 ENCOUNTER — Other Ambulatory Visit: Payer: Self-pay

## 2023-07-13 ENCOUNTER — Telehealth: Payer: Self-pay

## 2023-07-13 NOTE — Telephone Encounter (Signed)
error 

## 2023-07-17 ENCOUNTER — Other Ambulatory Visit: Payer: Self-pay

## 2023-07-17 DIAGNOSIS — I509 Heart failure, unspecified: Secondary | ICD-10-CM

## 2023-07-17 MED ORDER — POTASSIUM CHLORIDE ER 10 MEQ PO TBCR
10.0000 meq | EXTENDED_RELEASE_TABLET | Freq: Every day | ORAL | 11 refills | Status: DC
Start: 2023-07-17 — End: 2024-07-16

## 2023-07-24 ENCOUNTER — Telehealth: Payer: Self-pay

## 2023-07-24 NOTE — Telephone Encounter (Signed)
Pt called with questions regarding medications.  Reviewed pt's chart, returned call for clarification, two identifiers used. She stated that she just picked up her Plavix prescription, but she doesn't remember being on it. She is currently taking ASA 81 mg daily.  Spoke with Sam, PA who advised given pt's hx, she should be taking it.  Called pt and informed her of PA's advice. Pt confirmed that she did not have any bleeding issues. Pt will start taking Plavix today. Confirmed understanding.

## 2023-08-01 ENCOUNTER — Telehealth: Payer: Self-pay

## 2023-08-01 NOTE — Patient Outreach (Signed)
Care Management   Outreach Note  08/01/2023 Name: Danielle Schroeder MRN: [id number] DOB: Mar 19, 1956  An unsuccessful telephone outreach was attempted today to contact the patient about Care Management needs.     Follow Up Plan:  A HIPAA compliant phone message was left for the patient providing contact information and requesting a return call.    Katina [facility]  [facility], [facility] RN Care Manager Direct Dial: [phone] Website: [email or web address]

## 2023-08-08 ENCOUNTER — Encounter: Payer: Self-pay | Admitting: Obstetrics and Gynecology

## 2023-08-08 ENCOUNTER — Ambulatory Visit (INDEPENDENT_AMBULATORY_CARE_PROVIDER_SITE_OTHER): Payer: Medicare HMO | Admitting: Obstetrics and Gynecology

## 2023-08-08 ENCOUNTER — Other Ambulatory Visit (HOSPITAL_COMMUNITY)
Admission: RE | Admit: 2023-08-08 | Discharge: 2023-08-08 | Disposition: A | Discharge status: Home / Self Care | Payer: Medicare HMO | Source: Ambulatory Visit | Attending: Obstetrics and Gynecology | Admitting: Obstetrics and Gynecology

## 2023-08-08 ENCOUNTER — Other Ambulatory Visit: Payer: Self-pay

## 2023-08-08 VITALS — BP 128/82 | HR 87 | Ht 67.0 in | Wt 216.2 lb

## 2023-08-08 DIAGNOSIS — Z01419 Encounter for gynecological examination (general) (routine) without abnormal findings: Secondary | ICD-10-CM | POA: Diagnosis not present

## 2023-08-08 DIAGNOSIS — Z1151 Encounter for screening for human papillomavirus (HPV): Secondary | ICD-10-CM | POA: Diagnosis not present

## 2023-08-08 DIAGNOSIS — Z1339 Encounter for screening examination for other mental health and behavioral disorders: Secondary | ICD-10-CM | POA: Diagnosis not present

## 2023-08-08 DIAGNOSIS — Z124 Encounter for screening for malignant neoplasm of cervix: Secondary | ICD-10-CM

## 2023-08-08 MED ORDER — NOVOLOG FLEXPEN 100 UNIT/ML ~~LOC~~ SOPN: 0.0000 [IU] | PEN_INJECTOR | Freq: Three times a day (TID) | SUBCUTANEOUS | 1 refills | Status: DC

## 2023-08-08 NOTE — Progress Notes (Signed)
ANNUAL EXAM Patient name: Danielle Schroeder MRN [id number]  Date of birth: 1956-04-01 Chief Complaint:   Annual Exam  History of Present Illness:   Danielle Schroeder is a 67 y.o. No obstetric history on file.  female being seen today for a routine annual exam.  Current complaints: No vaginal or pelvic complaints. Was tx for recent yeast infection, symptoms have resolved. Has DEXA scan in February, will schedule next mammogram. Not sexually active, declines need for STD screen  No LMP recorded. Patient is postmenopausal.   The pregnancy intention screening data noted above was reviewed. Potential methods of contraception were discussed. The patient elected to proceed with No data recorded.   Last pap greater than 5 years ago. Results were:  reports normal . H/O abnormal pap: no Last mammogram: 2023. Results were: normal. Family h/o breast cancer: no     08/08/2023   10:20 AM 04/20/2023   11:20 AM 04/16/2023    1:55 PM 04/16/2023    1:48 PM 03/05/2023   10:17 AM  Depression screen PHQ 2/9  Decreased Interest 0 0 2 2 1   Down, Depressed, Hopeless 0 0 1 1 0  PHQ - 2 Score 0 0 3 3 1   Altered sleeping 0  3 3 2   Tired, decreased energy 0  3 3 2   Change in appetite 0  2 2 2   Feeling bad or failure about yourself  0  0 0 3  Trouble concentrating 0  0 0 0  Moving slowly or fidgety/restless 0  0 0 0  Suicidal thoughts 0  0 0 0  PHQ-9 Score 0  11 11 10   Difficult doing work/chores   Not difficult at all Not difficult at all Not difficult at all        08/08/2023   10:21 AM 04/16/2023    1:55 PM 03/05/2023   10:17 AM  GAD 7 : Generalized Anxiety Score  Nervous, Anxious, on Edge 0 0 0  Control/stop worrying 0 0 1  Worry too much - different things 0 0 1  Trouble relaxing 0 0 0  Restless 0 0 0  Easily annoyed or irritable 0 0 0  Afraid - awful might happen 0 0 1  Total GAD 7 Score 0 0 3  Anxiety Difficulty  Not difficult at all Somewhat difficult     Review of Systems:   Pertinent  items are noted in HPI Denies any headaches, blurred vision, fatigue, shortness of breath, chest pain, abdominal pain, abnormal vaginal discharge/itching/odor/irritation, problems with periods, bowel movements, urination, or intercourse unless otherwise stated above. Pertinent History Reviewed:  Reviewed past medical,surgical, social and family history.  Reviewed problem list, medications and allergies. Physical Assessment:   Vitals:   08/08/23 1011  BP: 128/82  Pulse: 87  Weight: 216 lb 3.2 oz (98.1 kg)  Height: 5\' 7"  (1.702 m)  Body mass index is 33.86 kg/m.        Physical Examination:   General appearance - well appearing, and in no distress  Mental status - alert, oriented to person, place, and time  Psych:  She has a normal mood and affect  Skin - warm and dry  Chest - effort normal, all lung fields clear to auscultation bilaterally  Heart - normal rate and regular rhythm  Neck:  midline trachea  Breasts - breasts appear normal, no suspicious masses, no skin or nipple changes or  axillary nodes  Abdomen - soft, nontender  Pelvic - VULVA: normal appearing  vulva with no masses, tenderness or lesions  VAGINA: normal appearing vagina with normal color and discharge, no lesions  CERVIX: normal appearing cervix without discharge or lesions, no CMT  Thin prep pap is done w HR HPV cotesting  UTERUS: uterus is felt to be normal size, shape, consistency and nontender   ADNEXA: No adnexal masses or tenderness noted.  Extremities:  No swelling or varicosities noted  Chaperone present for exam  No results found for this or any previous visit (from the past 24 hour(s)).  Assessment & Plan:  1. Well woman exam with routine gynecological exam   2. Cervical cancer screening Discussed guidelines if normal pap, will not need repeat  - Cytology - PAP( Colp)    Labs/procedures today:   Mammogram: schedule screening mammo as soon as possible, or sooner if problems   No orders  of the defined types were placed in this encounter.   Meds: No orders of the defined types were placed in this encounter.   Follow-up: Return in about 1 year (around 08/07/2024) for Thayer Jew, FNP

## 2023-08-13 LAB — CYTOLOGY - PAP
Adequacy: ABSENT
Comment: NEGATIVE
Diagnosis: NEGATIVE
High risk HPV: NEGATIVE

## 2023-08-20 ENCOUNTER — Telehealth: Payer: Self-pay

## 2023-08-20 NOTE — Patient Outreach (Signed)
Care Management   Outreach Note  08/20/2023 Name: Danielle Schroeder MRN: [id number] DOB: 03-Aug-1956  An unsuccessful telephone outreach was attempted today to contact the patient about Care Management needs.     Follow Up Plan:  Unable to leave a voice message due to patient's voice mailbox being full. A member of the care team will attempt additional outreach.  [facility]ina [facility]  [facility], [facility] RN Care Manager Direct Dial: [phone] Website: [email or web address]

## 2023-09-04 ENCOUNTER — Telehealth: Payer: Self-pay

## 2023-09-04 NOTE — Patient Outreach (Signed)
Care Management   Outreach Note  09/04/2023 Name: Danielle Schroeder MRN: [id number] DOB: 1956-06-21  An unsuccessful outreach attempt was made today. Multiple attempts have been made to reach [name].  Unable to leave Voice messages due to the patient's voice mailbox being full.  Also attempted to reach Latoya(daughter) and Johnnie(cousin) both on patient's DPR, regarding outreach with the Care Management team.   Follow Up Plan:  No further outreach will be attempted at this time. The care team will gladly attempt to reengage and assist for future care management needs.   Katina [facility]  [facility], [facility] RN Care Manager Direct Dial: [phone] Website: [email or web address]

## 2023-09-14 ENCOUNTER — Other Ambulatory Visit: Payer: Self-pay | Admitting: Family Medicine

## 2023-10-01 ENCOUNTER — Other Ambulatory Visit: Payer: Self-pay | Admitting: Family Medicine

## 2023-10-01 DIAGNOSIS — Z0000 Encounter for general adult medical examination without abnormal findings: Secondary | ICD-10-CM

## 2023-10-02 DIAGNOSIS — H43822 Vitreomacular adhesion, left eye: Secondary | ICD-10-CM | POA: Diagnosis not present

## 2023-10-02 DIAGNOSIS — E113511 Type 2 diabetes mellitus with proliferative diabetic retinopathy with macular edema, right eye: Secondary | ICD-10-CM | POA: Diagnosis not present

## 2023-10-02 DIAGNOSIS — E113592 Type 2 diabetes mellitus with proliferative diabetic retinopathy without macular edema, left eye: Secondary | ICD-10-CM | POA: Diagnosis not present

## 2023-10-09 ENCOUNTER — Ambulatory Visit: Payer: Medicare HMO | Admitting: Internal Medicine

## 2023-10-09 DIAGNOSIS — E785 Hyperlipidemia, unspecified: Secondary | ICD-10-CM | POA: Diagnosis not present

## 2023-10-09 DIAGNOSIS — E876 Hypokalemia: Secondary | ICD-10-CM | POA: Diagnosis not present

## 2023-10-09 DIAGNOSIS — N183 Chronic kidney disease, stage 3 unspecified: Secondary | ICD-10-CM | POA: Diagnosis not present

## 2023-10-09 DIAGNOSIS — I129 Hypertensive chronic kidney disease with stage 1 through stage 4 chronic kidney disease, or unspecified chronic kidney disease: Secondary | ICD-10-CM | POA: Diagnosis not present

## 2023-10-09 DIAGNOSIS — E1122 Type 2 diabetes mellitus with diabetic chronic kidney disease: Secondary | ICD-10-CM | POA: Diagnosis not present

## 2023-10-09 NOTE — Progress Notes (Deleted)
Name: Danielle Schroeder  MRN/ DOB: [id number], [birth year]    Age/ Sex: 67 y.o., female     PCP: [name], MD   Reason for Endocrinology Evaluation: Pituitary adenoma      Initial Endocrinology Clinic Visit: 10/06/2019    PATIENT IDENTIFIER: [name] is a 67 y.o., female with a past medical history of T2DM, dyslipidemia, pituitary adenoma, acromegaly, central hypothyroidism. She has followed with [facility] since 10/06/2019 for consultative assistance with management of her pituitary adenoma.     HISTORICAL SUMMARY:  Per patient she believes she had pituitary adenoma for >40 yrs. she does endorse history of subcutaneous injections in the past which sound like octreotide.  She was seen by Dr. Johnsie Cancel in 2020 for evaluation of pituitary adenoma. 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 She was deemed not a surgical candidate due to high risk for complication She is S/P radiation treatment in 2021 , not a candidate for surgical intervention    DM HISTORY : She was diagnosed with DM since the age of 49 units. She has been on insulin for years. Her A1c ranges from 8.2 % in 2023 to 8.8% in 2022      THYROID HISTORY: She has been diagnosed with thyroid disease in 2010 and has been on LT-for replacement She was also diagnosed with multinodular goiter in December 2020 based on thyroid ultrasound, no history of FNA   No FH of thyroid disease  Has Fh of DM  Denies FH of pituitary disease   SUBJECTIVE:   Today (10/09/2023):  Danielle Schroeder is here for Acromegaly , hypothyroidism and T2DM.  She checks glucose once daily , did not bring meter again today    She was recently admitted for osteomyelitis of the right lower extremity, she is s/p right great toe amputation  Denies nausea or vomiting  Denies  constipation or diarrhea  Denies diarrhea or loose stools  Denies local neck symptoms  She follows with Nephrology Dr. 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, CKD Denies:  Last Eye Exam: Completed   Macrovascular complications:  PVD Denies: CAD, CVA    HISTORY:  Past Medical History:  Past Medical History:  Diagnosis Date   Cataract    bilateral lens implants   CHF (congestive heart failure) (HCC)    Diabetes mellitus without complication (HCC)    Hyperlipidemia    Hypertension    Pituitary adenoma with extrasellar extension (HCC)    Sleep apnea    no c-pap   Thyroid disease    hypothyroidism   Past Surgical History:  Past Surgical History:  Procedure Laterality Date   ABDOMINAL AORTOGRAM W/LOWER EXTREMITY Left 04/11/2023   Procedure: ABDOMINAL AORTOGRAM W/LOWER EXTREMITY;  Surgeon: Victorino Sparrow, MD;  Location: [facility] INVASIVE CV LAB;  Service: Cardiovascular;  Laterality: Left;   ABDOMINAL AORTOGRAM W/LOWER EXTREMITY N/A 05/16/2023   Procedure: ABDOMINAL AORTOGRAM W/LOWER EXTREMITY;  Surgeon: Victorino Sparrow, MD;  Location: [facility] INVASIVE CV LAB;  Service: Cardiovascular;  Laterality: N/A;   AMPUTATION Right 05/18/2023   Procedure: RIGHT  GREAT TOE AMPUTATION;  Surgeon: Maeola Harman, MD;  Location: [facility] OR;  Service: Vascular;  Laterality: Right;   BREAST SURGERY     CHOLECYSTECTOMY     PERIPHERAL VASCULAR BALLOON ANGIOPLASTY Left 04/11/2023   Procedure: PERIPHERAL VASCULAR BALLOON ANGIOPLASTY;  Surgeon: Victorino Sparrow, MD;  Location: [facility] INVASIVE CV LAB;  Service: Cardiovascular;  Laterality: Left;  Lt AT   PERIPHERAL VASCULAR INTERVENTION Left 04/11/2023    Procedure: PERIPHERAL VASCULAR INTERVENTION;  Surgeon: Victorino Sparrow, MD;  Location: [facility] INVASIVE CV LAB;  Service: Cardiovascular;  Laterality: Left;  Lt SFA   PERIPHERAL VASCULAR INTERVENTION  05/16/2023   Procedure: PERIPHERAL VASCULAR INTERVENTION;  Surgeon: Victorino Sparrow, MD;  Location: [facility] INVASIVE CV LAB;  Service: Cardiovascular;;   TONSILLECTOMY     TUBAL LIGATION     Social History:  reports that she has never smoked. She has never used smokeless tobacco. She reports that she does not drink alcohol and does not use drugs. Family History:  Family History  Problem Relation Age of Onset   Thyroid disease Neg Hx    Colon cancer Neg Hx    Esophageal cancer Neg Hx    Rectal cancer Neg Hx    Stomach cancer Neg Hx      HOME MEDICATIONS: Allergies as of 10/09/2023   No Known Allergies      Medication List        Accurate as of October 09, 2023  7:22 AM. If you have any questions, ask your nurse or doctor.          acetaminophen 325 MG tablet Commonly known as: TYLENOL Take 2 tablets (650 mg total) by mouth every 4 (four) hours as needed for headache or mild pain.   amLODipine 5 MG tablet Commonly known as: NORVASC Take 1 tablet (5 mg total) by mouth daily.   aspirin EC 81 MG tablet Take 81 mg by mouth in the morning.   Basaglar KwikPen 100 UNIT/ML Inject 54 Units into the skin daily.   cabergoline 0.5 MG tablet Commonly known as: DOSTINEX Take 0.5 tablets (0.25 mg total) by mouth 2 (two) times a week.   carvedilol 25 MG tablet Commonly known as: COREG Take 25 mg by mouth 2 (two) times daily with a meal.   fluconazole 150 MG tablet Commonly known as: DIFLUCAN Take 1 tablet (150 mg total) by mouth every three (3) days as needed.   furosemide 40 MG tablet Commonly known as: LASIX Take 1 tablet (40 mg total) by mouth 2 (two) times daily.   glucose blood test strip 1 each by Other route 3 (three) times daily. Use as instructed   levothyroxine 88 MCG  tablet Commonly known as: SYNTHROID Take 1 tablet (88 mcg total) by mouth daily.   NovoLOG FlexPen 100 UNIT/ML FlexPen Generic drug: insulin aspart Inject 0-6 Units into the skin 3 (three) times daily before meals. Max daily 45 units   octreotide 100 MCG/ML Soln injection Commonly known as: SANDOSTATIN Inject 1 mL (100 mcg total) into the skin 3 (three) times daily.   Pen Needles 31G X 5 MM Misc 1 each by Does not apply route in the morning, at noon, in the evening, and at bedtime.   potassium chloride 10 MEQ tablet Commonly known as: KLOR-CON Take 1 tablet (10 mEq total) by mouth daily.   simvastatin 20 MG tablet Commonly known as: ZOCOR TAKE ONE TABLET (20 MG TOTAL) BY MOUTH DAILY AT 5PM EVERY EVENING   SYRINGE-NEEDLE (  DISP) 3 ML 25G X 1" 3 ML Misc Inject 1 ml into the skin 3 times daily.   Vitamin D (Cholecalciferol) 25 MCG (1000 UT) Caps Take 2 capsules by mouth daily. What changed: when to take this          OBJECTIVE:   PHYSICAL EXAM: VS: There were no vitals taken for this visit.   EXAM: General: Pt appears well and is in NAD  Neck: General: Supple without adenopathy. Thyroid: Thyroid size normal.  No goiter or nodules appreciated.   Lungs: Clear with good BS bilat   Heart: Auscultation: RRR.  Abdomen:  soft, nontender  Extremities:  BL LE: Stasis dermatitis with trace edema B/L   Mental Status: Judgment, insight: Intact Orientation: Oriented to time, place, and person Mood and affect: No depression, anxiety, or agitation   DM Foot exam 01/16/2023 The skin of the feet hows deep fissures at the anterior ankles with swelling, erythema and yellowish discharge on the right  The pedal pulses are feeble  The sensation is absent  to a screening 5.07, 10 gram monofilament bilaterally   DATA REVIEWED:  Latest Reference Range & Units 05/21/23 01:23  Sodium 135 - 145 mmol/L 139  Potassium 3.5 - 5.1 mmol/L 4.2  Chloride 98 - 111 mmol/L 105  CO2 22 - 32 mmol/L  23  Glucose 70 - 99 mg/dL 865 (H)  BUN 8 - 23 mg/dL 13  Creatinine 7.84 - 6.96 mg/dL 2.95 (H)  Calcium 8.9 - 10.3 mg/dL 9.5  Anion gap 5 - 15  11  Magnesium 1.7 - 2.4 mg/dL 2.1  GFR, Estimated >28 mL/min 55 (L)  (H): Data is abnormally high (L): Data is abnormally low    Latest Reference Range & Units 05/02/22 15:22  C206 ACTH 6 - 50 pg/mL 18  Cortisol, Plasma ug/dL 8.7  Growth Hormone < OR = 7.1 ng/mL 1.3  FSH mIU/ML 0.4  Prolactin ng/mL 3.0  Glucose 70 - 99 mg/dL 84  TSH 4.13 - 2.44 uIU/mL 0.02 (L)  T4,Free(Direct) 0.60 - 1.60 ng/dL 0.10     Brain MRI 2/72/5366   Sella: Stable distorted appearance of sella with thickened infundibulum contiguous with enhancing tissue at the right. Heterogeneous tissue on the left extending into the anterior suprasellar cistern and through a defect in the anterior left sellar floor. Unchanged probable involvement of the left cavernous sinus. Unchanged mild tethering of the optic chiasm.   Brain: There is no acute infarction or intracranial hemorrhage. There is no intracranial mass, mass effect, or edema. There is no hydrocephalus or extra-axial fluid collection. Ventricles and sulci are normal in size and configuration. Patchy foci of T2 hyperintensity in the supratentorial white matter are nonspecific but probably reflects stable chronic microvascular ischemic changes. Chronic T1 shortening, susceptibility, and T2 hyperintensity in the cerebellar hemispheres. Chronic T1 shortening and susceptibility in the basal ganglia bilaterally. These areas of susceptibility likely reflect mineralization. No abnormal enhancement.   Vascular: Major vessel flow voids at the skull base are preserved.   Skull and upper cervical spine: Normal marrow signal is preserved.   Sinuses/Orbits: Paranasal sinus mucosal thickening including lobular thickening in the frontal sinuses. Orbits are unremarkable.   Other: Mastoid air cells are clear.    IMPRESSION: Stable appearance of treated sella.     Thyroid ultrasound 01/22/2023  Estimated total number of nodules >/= 1 cm: 3   Number of spongiform nodules >/=  2 cm not described below (TR1): 0   Number of mixed cystic  and solid nodules >/= 1.5 cm not described below (TR2): 0   _________________________________________________________   Nodule # 4:   Prior biopsy: No   Location: Left; Mid   Maximum size: 1.9 cm; Other 2 dimensions: 1.5 x 0.9 cm, previously, 2.0 x 1.4 x 0.9 cm   Composition: solid/almost completely solid (2)   Echogenicity: isoechoic (1)   Shape: not taller-than-wide (0)   Margins: ill-defined (0)   Echogenic foci: none (0)   ACR TI-RADS total points: 3.   ACR TI-RADS risk category:  TR3 (3 points).   Significant change in size (>/= 20% in two dimensions and minimal increase of 2 mm):   Change in features: No   Change in ACR TI-RADS risk category: No   ACR TI-RADS recommendations:   *Given size (>/= 1.5 - 2.4 cm) and appearance, a follow-up ultrasound in 1 year should be considered based on TI-RADS criteria.   _________________________________________________________   Unchanged appearance of the previously visualized multifocal subcentimeter solid and right inferior solid cystic thyroid nodule which again appear benign not warrant additional ultrasound follow-up or tissue sampling.   No cervical lymphadenopathy.   IMPRESSION: 1. Similar appearing multinodular goiter. 2. Unchanged appearance of solid, left mid thyroid nodule (labeled 4, 1.9 cm, previously 2.0 cm) which again meets criteria (TI-RADS category 3) for 1 year ultrasound follow-up. This study marks 4 years stability.  Marliss Coots, MD       ASSESSMENT / PLAN / RECOMMENDATIONS:   Acromegaly   -She has been evaluated by neurosurgery and was deemed  not to be a surgical candidate -She is S/P radiation treatment in 2021 -She has been on octreotide 3 times  daily per subcutaneous injections, unable to switch to long-acting as it is cost prohibitive  and  will cause hardship on the patient as it has to be given through a health professional -Growth hormone level is slightly above (  goal <1 ng/mL) in the past   -MRI of the brain 07/2022 shows stability -She is not a candidate for pegvisomant at this time,  due to 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  Central hypothyroidism  -Free T4 is normal -No changes   Medication Continue levothyroxine 88 mcg daily  3. 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:  -A1c 12.2 % -Poorly controlled diabetes due to medication nonadherence, and dietary indiscretions -I have attempted to prescribe Dexcom in the past-offered her a receiver, but she continues to decline it, I briefly attempted to show her the sensor but she declines -Patient was advised that she MUST check glucose 3 times daily before each meal and use the NovoLog per correction scale - Metformin discontinued due to low GFR in June 2023 -Tradjenta was discontinued due to ineffectiveness and persistent hyperglycemia -She continues to use low-dose than previously prescribed NovoLog, I will ask you to increase it again -A referral to our CDE has been placed   Medication  Increase Basaglar 54 units ONCE  daily Increase NovoLog 6 units 3 times  daily before every meal Correction factor : (BG -130/30) 3 times daily before every meal      4. MNG: -No local neck  symptoms -Up-to-date on thyroid ultrasound    follow-up in 3 months   Signed electronically by: Lyndle Herrlich, MD  [facility]  [facility] [address] [place] [place], [place] [address] Phone: [phone] FAX: [phone]      CC: [name], MD [address] [place] [address] Phone: [phone]  Fax: [phone]   Return to Endocrinology clinic as below: Future Appointments  Date Time Provider Department Center  10/09/2023  1:20 PM Raileigh Sabater, Konrad Dolores, MD [facility] None  10/16/2023  9:20 AM [name], MD [facility] PEC  10/31/2023  4:30 PM GI-BCG MM 2 GI-BCGMM GI-BREAST CE  12/28/2023 11:30 AM GI-[facility] DX DEXA 1 GI-BCGDG GI-BREAST CE  01/02/2024  9:00 AM [name], MD [facility] PEC  01/04/2024  1:00 PM MC-CV HS VASC 3 MC-HCVI VVS  01/04/2024  2:00 PM MC-CV HS VASC 3 MC-HCVI VVS  01/04/2024  2:15 PM VVS-GSO PA-2 VVS-GSO VVS  04/25/2024 11:15 AM [facility] GV-ANNUAL WELLNESS VISIT [facility] PEC

## 2023-10-16 ENCOUNTER — Ambulatory Visit: Payer: Medicare HMO | Admitting: Family Medicine

## 2023-10-24 ENCOUNTER — Ambulatory Visit: Payer: Medicare HMO | Admitting: Family Medicine

## 2023-10-30 ENCOUNTER — Ambulatory Visit: Payer: Medicare HMO | Admitting: Family Medicine

## 2023-10-31 ENCOUNTER — Ambulatory Visit
Admission: RE | Admit: 2023-10-31 | Discharge: 2023-10-31 | Disposition: A | Discharge status: Home / Self Care | Payer: Medicare HMO | Source: Ambulatory Visit | Attending: Family Medicine

## 2023-10-31 DIAGNOSIS — Z1231 Encounter for screening mammogram for malignant neoplasm of breast: Secondary | ICD-10-CM | POA: Diagnosis not present

## 2023-10-31 DIAGNOSIS — Z0000 Encounter for general adult medical examination without abnormal findings: Secondary | ICD-10-CM

## 2023-11-05 ENCOUNTER — Other Ambulatory Visit: Payer: Self-pay | Admitting: Family Medicine

## 2023-11-05 ENCOUNTER — Telehealth: Payer: Self-pay

## 2023-11-05 DIAGNOSIS — R928 Other abnormal and inconclusive findings on diagnostic imaging of breast: Secondary | ICD-10-CM

## 2023-11-05 NOTE — Telephone Encounter (Signed)
Received fax from [facility] requesting orders. Contacted the breast center and spoke with Ebony to clarify if it was ok for another provider to sign off since PCP is out this week. She stated it was ok. While reviewing the chart I noticed that the orders were placed this morning by provider Worthy Rancher, NP for [place] LIMITED ULTRASOUND INCLUDING AXILLA LEFT BREAST AND MM 3D DIAGNOSTIC MAMMOGRAM UNILATERAL LEFT BREAST.

## 2023-11-23 ENCOUNTER — Telehealth: Payer: Self-pay

## 2023-11-23 NOTE — Progress Notes (Signed)
 Care Guide Pharmacy Note  11/23/2023 Name: [name] MRN: [id number] DOB: January 19, 1956  Referred By: Sebastian Beverley NOVAK, MD Reason for referral: Care Coordination (TNM Diabetes. )   Danielle Schroeder is a 68 y.o. year old female who is a primary care patient of Sebastian Beverley NOVAK, MD.  Leonard KANDICE Adsit was referred to the pharmacist for assistance related to: DMII  An unsuccessful telephone outreach was attempted today to contact the patient who was referred to the pharmacy team for assistance with diabetes. Additional attempts will be made to contact the patient.  Dreama Lynwood [facility]/VBCI-Population Health [facility] Assistant [phone]

## 2023-11-26 ENCOUNTER — Telehealth: Payer: Self-pay

## 2023-11-26 NOTE — Progress Notes (Signed)
 Care Guide Pharmacy Note  11/26/2023 Name: Danielle Schroeder MRN: [id number] DOB: [birth year]  Referred By: Sebastian Beverley NOVAK, MD Reason for referral: Care Coordination (TNM Diabetes. )   Danielle Schroeder is a 68 y.o. year old female who is a primary care patient of Sebastian Beverley NOVAK, MD.  Danielle Schroeder was referred to the pharmacist for assistance related to: DMII  A second unsuccessful telephone outreach was attempted today to contact the patient who was referred to the pharmacy team for assistance with diabetes. Additional attempts will be made to contact the patient.  Danielle Schroeder Health/VBCI-Population Health [facility] Assistant [phone]

## 2023-11-28 ENCOUNTER — Telehealth: Payer: Self-pay

## 2023-11-28 ENCOUNTER — Ambulatory Visit
Admission: RE | Admit: 2023-11-28 | Discharge: 2023-11-28 | Disposition: A | Discharge status: Home / Self Care | Payer: Medicare HMO | Source: Ambulatory Visit | Attending: Family Medicine | Admitting: Family Medicine

## 2023-11-28 ENCOUNTER — Ambulatory Visit
Admission: RE | Admit: 2023-11-28 | Discharge: 2023-11-28 | Disposition: A | Discharge status: Home / Self Care | Payer: Medicare HMO | Source: Ambulatory Visit | Attending: Family Medicine

## 2023-11-28 DIAGNOSIS — R928 Other abnormal and inconclusive findings on diagnostic imaging of breast: Secondary | ICD-10-CM | POA: Diagnosis not present

## 2023-11-28 NOTE — Progress Notes (Signed)
 Care Guide Pharmacy Note  11/28/2023 Name: Danielle Schroeder MRN: [id number] DOB: 12/02/1955  Referred By: Catheryn Cluck, MD Reason for referral: Care Coordination (TNM Diabetes. )   Danielle Schroeder is a 68 y.o. year old female who is a primary care patient of Catheryn Cluck, MD.  Danielle Schroeder was referred to the pharmacist for assistance related to: DMII  A third unsuccessful telephone outreach was attempted today to contact the patient who was referred to the pharmacy team for assistance with diabetes. The Population Schroeder team is pleased to engage with this patient at any time in the future upon receipt of referral and should he/she be interested in assistance from the [facility] Schroeder team.  Danielle Schroeder Danielle Schroeder  [facility] Schroeder Care Management Assistant Direct Dial: [phone]  Fax: [phone]

## 2023-11-29 ENCOUNTER — Telehealth: Payer: Self-pay

## 2023-11-29 NOTE — Telephone Encounter (Addendum)
Called patient and could not leave a voice message due to voicemail box is full. Will try call back.  ----- Message from [name] sent at 11/29/2023 12:59 PM EST ----- Normal Mammogram for breast cancer.  Repeat in 1 year.

## 2023-12-28 ENCOUNTER — Other Ambulatory Visit: Payer: Medicare HMO

## 2024-01-02 ENCOUNTER — Telehealth: Payer: Self-pay | Admitting: Family Medicine

## 2024-01-02 ENCOUNTER — Ambulatory Visit: Payer: Medicare HMO | Admitting: Family Medicine

## 2024-01-02 NOTE — Telephone Encounter (Signed)
Copied from CRM [id number]. 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 Patient call back [phone]

## 2024-01-02 NOTE — Telephone Encounter (Signed)
Dr Lonzo Cloud fills her insulin.  Mail order pharmacy should be able to change this to the generic that was sent instead of filling the name brand.   Tried calling patient and unable to leave VM to rtn call due to mailbox is full. Dm/cma

## 2024-01-03 ENCOUNTER — Ambulatory Visit: Payer: Medicare HMO | Admitting: Internal Medicine

## 2024-01-03 NOTE — Telephone Encounter (Signed)
Tried calling patient, but voicemail is full. Will try calling later.  

## 2024-01-04 ENCOUNTER — Ambulatory Visit (HOSPITAL_COMMUNITY): Payer: Medicare HMO

## 2024-01-04 ENCOUNTER — Ambulatory Visit: Payer: Medicare HMO

## 2024-01-15 ENCOUNTER — Telehealth: Payer: Self-pay

## 2024-01-15 DIAGNOSIS — E113513 Type 2 diabetes mellitus with proliferative diabetic retinopathy with macular edema, bilateral: Secondary | ICD-10-CM | POA: Diagnosis not present

## 2024-01-15 DIAGNOSIS — H43822 Vitreomacular adhesion, left eye: Secondary | ICD-10-CM | POA: Diagnosis not present

## 2024-01-15 NOTE — Telephone Encounter (Signed)
 Patient was identified as falling into the [place] Measure - Diabetes.   Patient was: Left voicemail to schedule with primary care provider.  Dr. Janee Morn MD

## 2024-01-18 ENCOUNTER — Other Ambulatory Visit: Payer: Self-pay | Admitting: Family Medicine

## 2024-01-22 NOTE — Progress Notes (Addendum)
 Name: Danielle Schroeder  MRN/ DOB: [id number], Jan 14, 1956    Age/ Sex: 68 y.o., female     PCP: [name], MD   Reason for Endocrinology Evaluation: Pituitary adenoma      Initial Endocrinology Clinic Visit: 10/06/2019    PATIENT IDENTIFIER: Danielle Schroeder is a 68 y.o., female with a past medical history of T2DM, dyslipidemia, pituitary adenoma, acromegaly, central hypothyroidism. She has followed with [place] [facility] since 10/06/2019 for consultative assistance with management of her pituitary adenoma.     HISTORICAL SUMMARY:  Per patient she believes she had pituitary adenoma for >40 yrs. she does endorse history of subcutaneous injections in the past which sound like octreotide.  She was seen by Dr. Johnsie Cancel in 2020 for evaluation of pituitary adenoma. 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 She was deemed not a surgical candidate due to high risk for complication She is S/P radiation treatment in 2021 , not a candidate for surgical intervention    DM HISTORY : She was diagnosed with DM since the age of 49 units. She has been on insulin for years. Her A1c ranges from 8.2 % in 2023 to 8.8% in 2022      THYROID HISTORY: She has been diagnosed with thyroid disease in 2010 and has been on LT-for replacement She was also diagnosed with multinodular goiter in December 2020 based on thyroid ultrasound, no history of FNA   No FH of thyroid disease  Has Fh of DM  Denies FH of pituitary disease   SUBJECTIVE:   Today (01/23/2024):  Danielle Schroeder is here for Acromegaly , hypothyroidism and T2DM.  She checks glucose once daily.    Denies nausea or vomiting  Has constipation constipation or diarrhea Denies local neck symptoms Has occasional headaches that are transient  Has noted vision changes   She follows  with Nephrology Dr. 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, CKD Denies:  Last Eye Exam: Completed   Macrovascular complications:  PVD Denies: CAD, CVA    HISTORY:  Past Medical History:  Past Medical History:  Diagnosis Date   Cataract    bilateral lens implants   CHF (congestive heart failure) (HCC)    Diabetes mellitus without complication (HCC)    Hyperlipidemia    Hypertension    Pituitary adenoma with extrasellar extension (HCC)    Sleep apnea    no c-pap   Thyroid disease    hypothyroidism   Past Surgical History:  Past Surgical History:  Procedure Laterality Date   ABDOMINAL AORTOGRAM W/LOWER EXTREMITY Left 04/11/2023   Procedure: ABDOMINAL AORTOGRAM W/LOWER EXTREMITY;  Surgeon: Victorino Sparrow, MD;  Location[facility] INVASIVE CV LAB;  Service: Cardiovascular;  Laterality: Left;  ABDOMINAL AORTOGRAM W/LOWER EXTREMITY N/A 05/16/2023   Procedure: ABDOMINAL AORTOGRAM W/LOWER EXTREMITY;  Surgeon: Victorino Sparrow, MD;  Location: [facility] INVASIVE CV LAB;  Service: Cardiovascular;  Laterality: N/A;   AMPUTATION Right 05/18/2023   Procedure: RIGHT GREAT TOE AMPUTATION;  Surgeon: Maeola Harman, MD;  Location[facility] OR;  Service: Vascular;  Laterality: Right;   BREAST EXCISIONAL BIOPSY Left    benign   BREAST SURGERY     CHOLECYSTECTOMY     PERIPHERAL VASCULAR BALLOON ANGIOPLASTY  Left 04/11/2023   Procedure: PERIPHERAL VASCULAR BALLOON ANGIOPLASTY;  Surgeon: Victorino Sparrow, MD;  Location: [facility] INVASIVE CV LAB;  Service: Cardiovascular;  Laterality: Left;  Lt AT   PERIPHERAL VASCULAR INTERVENTION Left 04/11/2023   Procedure: PERIPHERAL VASCULAR INTERVENTION;  Surgeon: Victorino Sparrow, MD;  Location: [facility] INVASIVE CV LAB;  Service: Cardiovascular;  Laterality: Left;  Lt SFA   PERIPHERAL VASCULAR INTERVENTION  05/16/2023   Procedure: PERIPHERAL VASCULAR INTERVENTION;  Surgeon: Victorino Sparrow, MD;  Location: [facility] INVASIVE CV LAB;  Service: Cardiovascular;;   TONSILLECTOMY     TUBAL LIGATION     Social History:  reports that she has never smoked. She has never used smokeless tobacco. She reports that she does not drink alcohol and does not use drugs. Family History:  Family History  Problem Relation Age of Onset   Thyroid disease Neg Hx    Colon cancer Neg Hx    Esophageal cancer Neg Hx    Rectal cancer Neg Hx    Stomach cancer Neg Hx    BRCA 1/2 Neg Hx    Breast cancer Neg Hx      HOME MEDICATIONS: Allergies as of 01/23/2024   No Known Allergies      Medication List        Accurate as of January 23, 2024 12:33 PM. If you have any questions, ask your nurse or doctor.          acetaminophen 325 MG tablet Commonly known as: TYLENOL Take 2 tablets (650 mg total) by mouth every 4 (four) hours as needed for headache or mild pain.   amLODipine 5 MG tablet Commonly known as: NORVASC Take 1 tablet (5 mg total) by mouth daily.   aspirin EC 81 MG tablet Take 81 mg by mouth in the morning.   Basaglar KwikPen 100 UNIT/ML Inject 54 Units into the skin daily.   cabergoline 0.5 MG tablet Commonly known as: DOSTINEX Take 0.5 tablets (0.25 mg total) by mouth 2 (two) times a week.   carvedilol 25 MG tablet Commonly known as: COREG Take 25 mg by mouth 2 (two) times daily with a meal.   clopidogrel 75 MG tablet Commonly known as: PLAVIX Take 75 mg by mouth  daily.   fluconazole 150 MG tablet Commonly known as: DIFLUCAN Take 1 tablet (150 mg total) by mouth every three (3) days as needed.   furosemide 40 MG tablet Commonly known as: LASIX TAKE 1 TABLET BY MOUTH 2 TIMES DAILY FOR 90 DAYS.   glucose blood test strip 1 each by Other route 3 (three) times daily. Use as instructed What changed: Another medication with the same name was removed. Continue taking this medication, and follow the directions you see here. Changed by: Johnney Ou Dequavion Follette   hydrocortisone 2.5 % ointment SMARTSIG:1 Topical Daily   levothyroxine 88 MCG tablet Commonly known as: SYNTHROID Take 1 tablet (88 mcg total) by mouth daily.   losartan 100 MG tablet Commonly known as: COZAAR Take  100 mg by mouth daily.   NovoLOG FlexPen 100 UNIT/ML FlexPen Generic drug: insulin aspart Inject 0-6 Units into the skin 3 (three) times daily before meals. Max daily 45 units   octreotide 100 MCG/ML Soln injection Commonly known as: SANDOSTATIN Inject 1 mL (100 mcg total) into the skin 3 (three) times daily.   OneTouch Verio w/Device Kit 1 Device by Does not apply route daily in the afternoon. Started by: Johnney Ou Althea Backs   Pen Needles 31G X 5 MM Misc 1 each by Does not apply route in the morning, at noon, in the evening, and at bedtime.   potassium chloride 10 MEQ tablet Commonly known as: KLOR-CON Take 1 tablet (10 mEq total) by mouth daily.   simvastatin 20 MG tablet Commonly known as: ZOCOR TAKE ONE TABLET (20 MG TOTAL) BY MOUTH DAILY AT 5PM EVERY EVENING   SYRINGE-NEEDLE (DISP) 3 ML 25G X 1" 3 ML Misc Inject 1 ml into the skin 3 times daily.   Vitamin D (Cholecalciferol) 25 MCG (1000 UT) Caps Take 2 capsules by mouth daily. What changed: when to take this          OBJECTIVE:   PHYSICAL EXAM: VS: BP 130/80 (BP Location: Left Arm, Patient Position: Sitting, Cuff Size: Normal)   Pulse 84   Ht 5\' 7"  (1.702 m)   Wt 230 lb (104.3 kg)   SpO2 99%    BMI 36.02 kg/m    EXAM: General: Pt appears well and is in NAD  Neck: General: Supple without adenopathy. Thyroid: Thyroid size normal.  No goiter or nodules appreciated.   Lungs: Clear with good BS bilat   Heart: Auscultation: RRR.  Abdomen:  soft, nontender  Extremities:  BL LE: Stasis dermatitis with trace edema B/L   Mental Status: Judgment, insight: Intact Orientation: Oriented to time, place, and person Mood and affect: No depression, anxiety, or agitation   DM Foot exam 01/16/2023 The skin of the feet hows deep fissures at the anterior ankles with swelling, erythema and yellowish discharge on the right  The pedal pulses are feeble  The sensation is absent  to a screening 5.07, 10 gram monofilament bilaterally   DATA REVIEWED:   Latest Reference Range & Units 01/23/24 12:38  Total CHOL/HDL Ratio <5.0 (calc) 2.0  Cholesterol <200 mg/dL 696  HDL Cholesterol > OR = 50 mg/dL 75  LDL Cholesterol (Calc) mg/dL (calc) 58  MICROALB/CREAT RATIO <30 mg/g creat 42 (H)  Non-HDL Cholesterol (Calc) <130 mg/dL (calc) 76  Triglycerides <150 mg/dL 92    Latest Reference Range & Units 01/23/24 12:38  T4,Free(Direct) 0.8 - 1.8 ng/dL 1.3    Latest Reference Range & Units 01/23/24 12:38  Microalb, Ur mg/dL 1.1  MICROALB/CREAT RATIO <30 mg/g creat 42 (H)  Creatinine, Urine 20 - 275 mg/dL 26  (H): Data is abnormally high    Brain MRI 07/24/2022   Sella: Stable distorted appearance of sella with thickened infundibulum contiguous with enhancing tissue at the right. Heterogeneous tissue on the left extending into the anterior suprasellar cistern and through a defect in the anterior left sellar floor. Unchanged probable involvement of the left cavernous sinus. Unchanged mild tethering of the optic chiasm.   Brain: There is no acute infarction or intracranial hemorrhage. There is no intracranial mass, mass effect, or edema. There is no hydrocephalus or extra-axial fluid collection.  Ventricles and sulci are normal in size and configuration. Patchy foci of T2 hyperintensity in the supratentorial white matter are nonspecific but probably  reflects stable chronic microvascular ischemic changes. Chronic T1 shortening, susceptibility, and T2 hyperintensity in the cerebellar hemispheres. Chronic T1 shortening and susceptibility in the basal ganglia bilaterally. These areas of susceptibility likely reflect mineralization. No abnormal enhancement.   Vascular: Major vessel flow voids at the skull base are preserved.   Skull and upper cervical spine: Normal marrow signal is preserved.   Sinuses/Orbits: Paranasal sinus mucosal thickening including lobular thickening in the frontal sinuses. Orbits are unremarkable.   Other: Mastoid air cells are clear.   IMPRESSION: Stable appearance of treated sella.     Thyroid ultrasound 01/22/2023  Estimated total number of nodules >/= 1 cm: 3   Number of spongiform nodules >/=  2 cm not described below (TR1): 0   Number of mixed cystic and solid nodules >/= 1.5 cm not described below (TR2): 0   _________________________________________________________   Nodule # 4:   Prior biopsy: No   Location: Left; Mid   Maximum size: 1.9 cm; Other 2 dimensions: 1.5 x 0.9 cm, previously, 2.0 x 1.4 x 0.9 cm   Composition: solid/almost completely solid (2)   Echogenicity: isoechoic (1)   Shape: not taller-than-wide (0)   Margins: ill-defined (0)   Echogenic foci: none (0)   ACR TI-RADS total points: 3.   ACR TI-RADS risk category:  TR3 (3 points).   Significant change in size (>/= 20% in two dimensions and minimal increase of 2 mm):   Change in features: No   Change in ACR TI-RADS risk category: No   ACR TI-RADS recommendations:   *Given size (>/= 1.5 - 2.4 cm) and appearance, a follow-up ultrasound in 1 year should be considered based on TI-RADS criteria.   _________________________________________________________    Unchanged appearance of the previously visualized multifocal subcentimeter solid and right inferior solid cystic thyroid nodule which again appear benign not warrant additional ultrasound follow-up or tissue sampling.   No cervical lymphadenopathy.   IMPRESSION: 1. Similar appearing multinodular goiter. 2. Unchanged appearance of solid, left mid thyroid nodule (labeled 4, 1.9 cm, previously 2.0 cm) which again meets criteria (TI-RADS category 3) for 1 year ultrasound follow-up. This study marks 4 years stability.  Marliss Coots, MD       ASSESSMENT / PLAN / RECOMMENDATIONS:   Acromegaly   -She has been evaluated by neurosurgery and was deemed  not to be a surgical candidate -She is S/P radiation treatment in 2021 -She has been on octreotide 3 times daily per subcutaneous injections, unable to switch to long-acting as it is cost prohibitive  and  will cause hardship on the patient as it has to be given through a health professional -Growth hormone level is slightly above (  goal <1 ng/mL) in the past   -MRI of the brain 07/2022 shows stability -She is not a candidate for pegvisomant at this time,  due to 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  Central hypothyroidism  -Free T4 is normal -No changes   Medication Continue levothyroxine 88 mcg daily  3. 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 MNG: -No local neck symptoms -Will proceed with repeat ultrasound    5.Hypothyroidism:  - FT4 is normal  - NO change    Medication   Continue Levothyroxine 88 mcg daily   6.CKDIII/Microalbuminuria :   - Refilled Losartan 100 mg daily    follow-up in 4 months   Signed electronically by: Lyndle Herrlich, MD  [facility] Endocrinology  [facility] [address] [place], [place] [address] Phone: [phone] FAX: [phone]      CC: [name], MD [address] [place] [address] Phone: [phone]  Fax: [phone]   Return to Endocrinology clinic as below: Future Appointments  Date Time Provider Department Center  02/21/2024  2:30 PM [facility] 6 [facility]  02/21/2024  3:00 PM [facility] 6 [facility]  02/21/2024  3:15 PM [facility] PA-2 [facility]  04/10/2024  9:00 AM [name], MD [facility]  04/25/2024 11:30 AM [facility]-ANNUAL WELLNESS VISIT [facility]

## 2024-01-23 ENCOUNTER — Ambulatory Visit: Payer: Medicare HMO | Admitting: Internal Medicine

## 2024-01-23 ENCOUNTER — Encounter: Payer: Self-pay | Admitting: Internal Medicine

## 2024-01-23 VITALS — BP 130/80 | HR 84 | Ht 67.0 in | Wt 230.0 lb

## 2024-01-23 DIAGNOSIS — E1165 Type 2 diabetes mellitus with hyperglycemia: Secondary | ICD-10-CM

## 2024-01-23 DIAGNOSIS — E220 Acromegaly and pituitary gigantism: Secondary | ICD-10-CM

## 2024-01-23 DIAGNOSIS — E1122 Type 2 diabetes mellitus with diabetic chronic kidney disease: Secondary | ICD-10-CM | POA: Diagnosis not present

## 2024-01-23 DIAGNOSIS — E042 Nontoxic multinodular goiter: Secondary | ICD-10-CM | POA: Diagnosis not present

## 2024-01-23 DIAGNOSIS — E038 Other specified hypothyroidism: Secondary | ICD-10-CM | POA: Diagnosis not present

## 2024-01-23 DIAGNOSIS — Z794 Long term (current) use of insulin: Secondary | ICD-10-CM | POA: Diagnosis not present

## 2024-01-23 DIAGNOSIS — N1832 Chronic kidney disease, stage 3b: Secondary | ICD-10-CM

## 2024-01-23 DIAGNOSIS — D352 Benign neoplasm of pituitary gland: Secondary | ICD-10-CM

## 2024-01-23 LAB — POCT GLYCOSYLATED HEMOGLOBIN (HGB A1C): Hemoglobin A1C: 8.4 % — AB (ref 4.0–5.6)

## 2024-01-23 MED ORDER — ONETOUCH VERIO W/DEVICE KIT: 1.0000 | PACK | Freq: Every day | 0 refills | Status: DC

## 2024-01-23 MED ORDER — GLUCOSE BLOOD VI STRP: 1.0000 | ORAL_STRIP | Freq: Three times a day (TID) | 12 refills | Status: DC

## 2024-01-23 NOTE — Patient Instructions (Signed)
 Continue cabergoline 0.5 mg, 1 tablet TWICE WEEKLY ( Wednesday and Sunday) Continue  Basaglar 54 units ONCE DAILY  Novolog 6 units with each meal      HOW TO TREAT LOW BLOOD SUGARS (Blood sugar LESS THAN 70 MG/DL) Please follow the RULE OF 15 for the treatment of hypoglycemia treatment (when your (blood sugars are less than 70 mg/dL)   STEP 1: Take 15 grams of carbohydrates when your blood sugar is low, which includes:  3-4 GLUCOSE TABS  OR 3-4 OZ OF JUICE OR REGULAR SODA OR ONE TUBE OF GLUCOSE GEL    STEP 2: RECHECK blood sugar in 15 MINUTES STEP 3: If your blood sugar is still low at the 15 minute recheck --> then, go back to STEP 1 and treat AGAIN with another 15 grams of carbohydrates.

## 2024-01-29 MED ORDER — PEN NEEDLES 31G X 5 MM MISC: 1.0000 | Freq: Four times a day (QID) | 3 refills | Status: AC

## 2024-01-29 MED ORDER — OCTREOTIDE ACETATE 100 MCG/ML IJ SOLN: 100.0000 ug | Freq: Three times a day (TID) | INTRAMUSCULAR | 3 refills | Status: DC

## 2024-01-29 MED ORDER — CABERGOLINE 0.5 MG PO TABS
0.2500 mg | ORAL_TABLET | ORAL | 3 refills | Status: DC
Start: 2024-01-31 — End: 2024-01-29

## 2024-01-29 MED ORDER — LOSARTAN POTASSIUM 100 MG PO TABS: 100.0000 mg | ORAL_TABLET | Freq: Every day | ORAL | 3 refills | Status: AC

## 2024-01-29 MED ORDER — CABERGOLINE 0.5 MG PO TABS: 0.5000 mg | ORAL_TABLET | ORAL | 3 refills | Status: DC

## 2024-01-29 MED ORDER — LEVOTHYROXINE SODIUM 88 MCG PO TABS: 88.0000 ug | ORAL_TABLET | Freq: Every day | ORAL | 3 refills | Status: DC

## 2024-01-30 ENCOUNTER — Other Ambulatory Visit

## 2024-02-02 LAB — LIPID PANEL
Cholesterol: 151 mg/dL (ref <200)
HDL: 75 mg/dL (ref >=50)
LDL Cholesterol (Calc): 58 mg/dL
Non-HDL Cholesterol (Calc): 76 mg/dL (ref <130)
Total CHOL/HDL Ratio: 2 (calc) (ref <5.0)
Triglycerides: 92 mg/dL (ref <150)

## 2024-02-02 LAB — MICROALBUMIN / CREATININE URINE RATIO
Creatinine, Urine: 26 mg/dL (ref 20–275)
Microalb Creat Ratio: 42 mg/g{creat} — ABNORMAL HIGH (ref <30)
Microalb, Ur: 1.1 mg/dL

## 2024-02-02 LAB — T4, FREE: Free T4: 1.3 ng/dL (ref 0.8–1.8)

## 2024-02-02 LAB — INSULIN-LIKE GROWTH FACTOR
IGF-I, LC/MS: 840 ng/mL — ABNORMAL HIGH (ref 41–279)
Z-Score (Female): 5 {STDV} — ABNORMAL HIGH (ref ?–2.0)

## 2024-02-05 ENCOUNTER — Encounter: Payer: Self-pay | Admitting: Internal Medicine

## 2024-02-05 ENCOUNTER — Telehealth: Payer: Self-pay | Admitting: Internal Medicine

## 2024-02-05 DIAGNOSIS — E220 Acromegaly and pituitary gigantism: Secondary | ICD-10-CM

## 2024-02-05 NOTE — Telephone Encounter (Signed)
 I have attempted to contact the patient on 02/05/2024 at 12:30 PM, there was no answer and her voicemail was full.   A letter will be sent to the patient to discuss elevated IGF-I   I am not sure if she had a labs and octreotide use, will consider switching to oral octreotide   Will proceed with brain MRI

## 2024-02-12 ENCOUNTER — Other Ambulatory Visit: Payer: Self-pay | Admitting: Family Medicine

## 2024-02-12 MED ORDER — CARVEDILOL 25 MG PO TABS: 25.0000 mg | ORAL_TABLET | Freq: Two times a day (BID) | ORAL | 3 refills | Status: DC

## 2024-02-12 NOTE — Telephone Encounter (Signed)
 Last Fill: Unknown  Last OV: 07/02/23 Next OV: 04/10/24  Routing to provider for review/authorization.      Copied from CRM [id number]. Topic: Clinical - Medication Refill >> Feb 12, 2024 11:00 AM Aletta Edouard wrote: Most Recent Primary Care Visit:  Provider: [name]  Department: [facility]  Visit Type: OFFICE VISIT  Date: 07/02/2023  Medication: carvevilol  Has the patient contacted their pharmacy? Yes (Agent: If no, request that the patient contact the pharmacy for the refill. If patient does not wish to contact the pharmacy document the reason why and proceed with request.) (Agent: If yes, when and what did the pharmacy advise?)  Is this the correct pharmacy for this prescription? Yes If no, delete pharmacy and type the correct one.  This is the patient's preferred pharmacy:  [facility] [address], [place] - [address] [place] [address] Phone: [phone] Fax: [phone]   Has the prescription been filled recently? No  Is the patient out of the medication? Yes  Has the patient been seen for an appointment in the last year OR does the patient have an upcoming appointment? Yes  Can we respond through MyChart? No  Agent: Please be advised that Rx refills may take up to 3 business days. We ask that you follow-up with your pharmacy.

## 2024-02-21 ENCOUNTER — Encounter: Payer: Self-pay | Admitting: Physician Assistant

## 2024-02-21 ENCOUNTER — Ambulatory Visit: Payer: Medicare HMO | Admitting: Physician Assistant

## 2024-02-21 ENCOUNTER — Ambulatory Visit (HOSPITAL_COMMUNITY)
Admission: RE | Admit: 2024-02-21 | Discharge: 2024-02-21 | Disposition: A | Discharge status: Home / Self Care | Payer: Medicare HMO | Source: Ambulatory Visit | Attending: Vascular Surgery | Admitting: Vascular Surgery

## 2024-02-21 ENCOUNTER — Ambulatory Visit (INDEPENDENT_AMBULATORY_CARE_PROVIDER_SITE_OTHER)
Admission: RE | Admit: 2024-02-21 | Discharge: 2024-02-21 | Disposition: A | Discharge status: Home / Self Care | Payer: Medicare HMO | Source: Ambulatory Visit | Attending: Vascular Surgery | Admitting: Vascular Surgery

## 2024-02-21 VITALS — BP 157/99 | HR 75 | Temp 97.8°F | Ht 67.0 in | Wt 222.8 lb

## 2024-02-21 DIAGNOSIS — I70644 Atherosclerosis of nonbiological bypass graft(s) of the left leg with ulceration of heel and midfoot: Secondary | ICD-10-CM

## 2024-02-21 DIAGNOSIS — I739 Peripheral vascular disease, unspecified: Secondary | ICD-10-CM | POA: Diagnosis not present

## 2024-02-21 DIAGNOSIS — I872 Venous insufficiency (chronic) (peripheral): Secondary | ICD-10-CM

## 2024-02-21 LAB — VAS US ABI WITH/WO TBI
Left ABI: 1.1
Right ABI: 0.97

## 2024-02-21 NOTE — Progress Notes (Signed)
 Office Note   History of Present Illness   Danielle Schroeder is a 68 y.o. (1956-09-30) female who presents for surveillance of PAD.She has a history of left SFA stenting on 04/11/2023 and right posterior tibial and peroneal artery balloon angioplasty on 05/16/2023 by Dr.Robins. 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 Her amputation site has completely healed.   She returns today for follow up. She denies any recent medical history changes. She continues to struggle with extensive BLE edema and skin thickening. She denies any tissue loss, claudication, or rest pain.  Current Outpatient Medications  Medication Sig Dispense Refill   acetaminophen (TYLENOL) 325 MG tablet Take 2 tablets (650 mg total) by mouth every 4 (four) hours as needed for headache or mild pain. 30 tablet 0   amLODipine (NORVASC) 5 MG tablet Take 1 tablet (5 mg total) by mouth daily. 90 tablet 0   aspirin EC 81 MG tablet Take 81 mg by mouth in the morning.     Blood Glucose Monitoring Suppl (ONETOUCH VERIO) w/Device KIT 1 Device by Does not apply route daily in the afternoon. 1 kit 0   cabergoline (DOSTINEX) 0.5 MG tablet Take 1 tablet (0.5 mg total) by mouth 2 (two) times a week. 26 tablet 3   carvedilol (COREG) 25 MG tablet Take 1 tablet (25 mg total) by mouth 2 (two) times daily with a meal. 90 tablet 3   clopidogrel (PLAVIX) 75 MG tablet Take 75 mg by mouth daily.     fluconazole (DIFLUCAN) 150 MG tablet Take 1 tablet (150 mg total) by mouth every three (3) days as needed. (Patient not taking: Reported on 01/23/2024) 2 tablet 0   furosemide (LASIX) 40 MG tablet TAKE 1 TABLET BY MOUTH 2 TIMES DAILY FOR 90 DAYS. 180 tablet 3   glucose blood test strip 1 each by Other route 3 (three) times daily. Use as instructed 300 each 12   hydrocortisone 2.5 % ointment SMARTSIG:1 Topical Daily     insulin aspart (NOVOLOG FLEXPEN) 100 UNIT/ML FlexPen Inject 0-6 Units into the skin 3 (three) times daily  before meals. Max daily 45 units 45 mL 1   Insulin Glargine (BASAGLAR KWIKPEN) 100 UNIT/ML Inject 54 Units into the skin daily. 60 mL 3   Insulin Pen Needle (PEN NEEDLES) 31G X 5 MM MISC 1 each by Does not apply route in the morning, at noon, in the evening, and at bedtime. 400 each 3   levothyroxine (SYNTHROID) 88 MCG tablet Take 1 tablet (88 mcg total) by mouth daily. 90 tablet 3   losartan (COZAAR) 100 MG tablet Take 1 tablet (100 mg total) by mouth daily. 90 tablet 3   octreotide (SANDOSTATIN) 100 MCG/ML SOLN injection Inject 1 mL (100 mcg total) into the skin 3 (three) times daily. 270 mL 3   potassium chloride (KLOR-CON) 10 MEQ tablet Take 1 tablet (10 mEq total) by mouth daily. 30 tablet 11   simvastatin (ZOCOR) 20 MG tablet TAKE ONE TABLET (20 MG TOTAL) BY MOUTH DAILY AT 5PM EVERY EVENING 90 tablet 3   SYRINGE-NEEDLE, DISP, 3 ML 25G X 1" 3 ML MISC Inject 1 ml into the skin 3 times daily. 100 each 11   Vitamin D, Cholecalciferol, 25 MCG (1000 UT) CAPS Take 2 capsules by mouth daily. (Patient taking differently: Take 2 capsules by mouth daily with lunch.) 60 capsule 0   No current facility-administered medications for this visit.    REVIEW OF SYSTEMS (negative  unless checked):   Cardiac:  []  Chest pain or chest pressure? []  Shortness of breath upon activity? []  Shortness of breath when lying flat? []  Irregular heart rhythm?  Vascular:  []  Pain in calf, thigh, or hip brought on by walking? []  Pain in feet at night that wakes you up from your sleep? []  Blood clot in your veins? [x]  Leg swelling?  Pulmonary:  []  Oxygen at home? []  Productive cough? []  Wheezing?  Neurologic:  []  Sudden weakness in arms or legs? []  Sudden numbness in arms or legs? []  Sudden onset of difficult speaking or slurred speech? []  Temporary loss of vision in one eye? []  Problems with dizziness?  Gastrointestinal:  []  Blood in stool? []  Vomited blood?  Genitourinary:  []  Burning when  urinating? []  Blood in urine?  Psychiatric:  []  Major depression  Hematologic:  []  Bleeding problems? []  Problems with blood clotting?  Dermatologic:  []  Rashes or ulcers?  Constitutional:  []  Fever or chills?  Ear/Nose/Throat:  []  Change in hearing? []  Nose bleeds? []  Sore throat?  Musculoskeletal:  []  Back pain? []  Joint pain? []  Muscle pain?   Physical Examination   Vitals:   02/21/24 1516  BP: (!) 157/99  Pulse: 75  Temp: 97.8 F (36.6 C)  TempSrc: Temporal  SpO2: 96%  Weight: 222 lb 12.8 oz (101.1 kg)  Height: 5\' 7"  (1.702 m)   There is no height or weight on file to calculate BMI.  General:  WDWN in NAD; vital signs documented above Gait: Not observed HENT: WNL, normocephalic Pulmonary: normal non-labored breathing , without rales, rhonchi,  wheezing Cardiac: regular Abdomen: soft, NT, no masses Skin: without rashes Vascular Exam/Pulses: nonpalpable pedal pulses Extremities: Significant bilateral lower leg swelling with hyperpigmentation and lipodermatosclerosis, no venous ulcerations or wounds Musculoskeletal: no muscle wasting or atrophy  Neurologic: A&O X 3;  No focal weakness or paresthesias are detected Psychiatric:  The pt has Normal affect.  Non-Invasive Vascular imaging   ABI (02/21/2024) Right   Rt Pressure (mmHg)IndexWaveform  Comment   +--------+------------------+-----+----------+--------+  [id number]                                       +--------+------------------+-----+----------+--------+  PTA    151               0.97 monophasic          +--------+------------------+-----+----------+--------+  DP     139               0.89 monophasic          +--------+------------------+-----+----------+--------+   +---------+------------------+-----+----------+-------+  Left    Lt Pressure (mmHg)IndexWaveform  Comment  +---------+------------------+-----+----------+-------+  Brachial 156                                        +---------+------------------+-----+----------+-------+  PTA     125               0.80 monophasic         +---------+------------------+-----+----------+-------+  DP      172               1.10 monophasic         +---------+------------------+-----+----------+-------+  Great Toe97                0.62                    +---------+------------------+-----+----------+-------+   +-------+-----------+-----------+------------+------------+  ABI/TBIToday's ABIToday's TBIPrevious ABIPrevious TBI  +-------+-----------+-----------+------------+------------+  Right 0.97       amp        0.63        amp           +-------+-----------+-----------+------------+------------+  Left  1.10       0.62       0.96        0.59          +-------+-----------+-----------+------------+------------+    BLE Arterial Duplex (02/21/2024) +-----------+--------+-----+---------------+----------+--------+  RIGHT     PSV cm/sRatioStenosis       Waveform  Comments  +-----------+--------+-----+---------------+----------+--------+  CFA Distal 143                         triphasic           +-----------+--------+-----+---------------+----------+--------+  DFA       134                         triphasic           +-----------+--------+-----+---------------+----------+--------+  SFA Prox   237          50-74% stenosis                    +-----------+--------+-----+---------------+----------+--------+  SFA Mid    135                         monophasic          +-----------+--------+-----+---------------+----------+--------+  SFA Distal 173          30-49% stenosismonophasic          +-----------+--------+-----+---------------+----------+--------+  POP Prox   112                         monophasic          +-----------+--------+-----+---------------+----------+--------+  POP Distal 88                           monophasic          +-----------+--------+-----+---------------+----------+--------+  ATA Distal 25                          monophasic          +-----------+--------+-----+---------------+----------+--------+  PTA Distal 137                         monophasic          +-----------+--------+-----+---------------+----------+--------+  PERO Distal                                      NWV       +-----------+--------+-----+---------------+----------+--------+        +-----------+--------+-----+---------------+----------+--------+  LEFT      PSV cm/sRatioStenosis       Waveform  Comments  +-----------+--------+-----+---------------+----------+--------+  CFA Distal 137                         triphasic           +-----------+--------+-----+---------------+----------+--------+  DFA       124  triphasic           +-----------+--------+-----+---------------+----------+--------+  SFA Prox   251          50-74% stenosismonophasic          +-----------+--------+-----+---------------+----------+--------+  SFA Mid    237          50-74% stenosismonophasic          +-----------+--------+-----+---------------+----------+--------+  SFA Distal 182          30-49% stenosismonophasic          +-----------+--------+-----+---------------+----------+--------+  POP Prox   142                         monophasic          +-----------+--------+-----+---------------+----------+--------+  POP Distal 114                         monophasic          +-----------+--------+-----+---------------+----------+--------+  ATA Distal 65                          monophasic          +-----------+--------+-----+---------------+----------+--------+  PTA Distal 104                         monophasic          +-----------+--------+-----+---------------+----------+--------+  PERO Distal                                       NWV       +-----------+--------+-----+---------------+----------+--------+   Medical Decision Making   Kathlene G Veracruz is a 68 y.o. female who presents for surveillance of PAD  Based on the patient's vascular studies, her ABIs are improved bilaterally. Her right ABI is 0.97 and left is 1.1 Arterial duplex demonstrates patent arterial flow in BLE without hemodynamically significant stenosis. Her left SFA stent was not well visualized but is likely patent She denies any claudication, rest pain, or tissue loss. She has nonpalpable pedal pulses She continues to have significant BLE edema. I have encouraged her to elevate her legs regularly to control her leg swelling She can follow up with our office in 1 year with BLE arterial duplex and ABIs   Deneise Finlay PA-C [facility] Office: [phone]  Clinic MD: Rosalva Comber

## 2024-02-25 ENCOUNTER — Telehealth: Payer: Self-pay

## 2024-02-25 NOTE — Telephone Encounter (Signed)
 Pt is scheduled to see Dr. Hildy Lowers on 02/26/2024 to assist and evaluate concerns.

## 2024-02-25 NOTE — Telephone Encounter (Signed)
 Copied from CRM [id number]. Topic: Clinical - Medical Advice >> Feb 25, 2024  9:00 AM Albertha Alosa wrote: Reason for CRM: Patient called in regarding having yeast infection again, did schedule a appointment for tomorrow 04/15 , but wanted to know if she has to come in or if something can just be called in to the pharmacy, would like a callback regarding this     [phone]

## 2024-02-26 ENCOUNTER — Ambulatory Visit (INDEPENDENT_AMBULATORY_CARE_PROVIDER_SITE_OTHER): Admitting: Family Medicine

## 2024-02-26 ENCOUNTER — Other Ambulatory Visit (HOSPITAL_COMMUNITY)
Admission: RE | Admit: 2024-02-26 | Discharge: 2024-02-26 | Disposition: A | Discharge status: Home / Self Care | Source: Ambulatory Visit | Attending: Family Medicine | Admitting: Family Medicine

## 2024-02-26 ENCOUNTER — Encounter: Payer: Self-pay | Admitting: Family Medicine

## 2024-02-26 VITALS — BP 124/80 | HR 75 | Temp 97.9°F | Wt 224.2 lb

## 2024-02-26 DIAGNOSIS — L304 Erythema intertrigo: Secondary | ICD-10-CM | POA: Diagnosis not present

## 2024-02-26 DIAGNOSIS — N898 Other specified noninflammatory disorders of vagina: Secondary | ICD-10-CM

## 2024-02-26 DIAGNOSIS — E1122 Type 2 diabetes mellitus with diabetic chronic kidney disease: Secondary | ICD-10-CM

## 2024-02-26 DIAGNOSIS — Z794 Long term (current) use of insulin: Secondary | ICD-10-CM | POA: Diagnosis not present

## 2024-02-26 DIAGNOSIS — N1832 Chronic kidney disease, stage 3b: Secondary | ICD-10-CM | POA: Diagnosis not present

## 2024-02-26 MED ORDER — CLOTRIMAZOLE 1 % EX CREA: 1.0000 | TOPICAL_CREAM | Freq: Two times a day (BID) | CUTANEOUS | 0 refills | Status: DC

## 2024-02-26 NOTE — Patient Instructions (Signed)
 VISIT SUMMARY:  During your visit, we discussed your recent experience with vaginal itching and skin irritation, as well as your diabetes management. We reviewed your symptoms and made a plan to address the itching and ensure your diabetes remains well-controlled.  YOUR PLAN:  -INTERTRIGO WITH POSSIBLE VAGINAL YEAST INFECTION: Intertrigo is a rash that occurs in skin folds, often due to moisture and friction, and can sometimes be complicated by a yeast infection. You will use clotrimazole 1% ointment on the affected areas twice daily for 7-14 days. We will also perform a vaginal swab to check for a yeast infection or bacterial vaginosis. Keep the affected areas dry, wear loose clothing, and use cornstarch or baby powder. Use a gentle skin soap with minimal additives. If the swab indicates a yeast infection, we may consider oral Diflucan.  -DIABETES MELLITUS: Diabetes mellitus is a condition where your blood sugar levels are too high. Your diabetes has improved due to better dietary habits. Continue with your current diet and monitor your blood sugar levels regularly.  INSTRUCTIONS:  Please follow up after completing the course of clotrimazole or if your symptoms do not improve. We will contact you with the results of your vaginal swab. Continue monitoring your blood sugar levels and maintain your dietary habits to keep your diabetes under control.

## 2024-02-26 NOTE — Progress Notes (Unsigned)
 Assessment/Plan:   Problem List Items Addressed This Visit   None   Assessment and Plan Assessment & Plan       There are no discontinued medications.  No follow-ups on file.    Subjective:   Encounter date: 02/26/2024  Danielle Schroeder is a 68 y.o. female who has Goiter; Acquired hypothyroidism; Pituitary adenoma with extrasellar extension (HCC); Acromegaly (HCC); CHF (congestive heart failure) (HCC); Stable treated proliferative diabetic retinopathy of right eye determined by examination associated with type 2 diabetes mellitus (HCC); Proliferative diabetic retinopathy of left eye with macular edema associated with type 2 diabetes mellitus (HCC); Retinal hemorrhage of left eye; Retinal microaneurysm of both eyes; Retinal hemorrhage of right eye; Vitamin D deficiency; Vitreomacular adhesion, left; Type 2 diabetes mellitus with stage 3b chronic kidney disease, with long-term current use of insulin (HCC); DM2 (diabetes mellitus, type 2) (HCC); Pseudophakia, both eyes; Cataract; CKD stage 3b, GFR 30-44 ml/min (HCC); Diabetic neuropathy (HCC); Diabetic retinopathy associated with type 2 diabetes mellitus (HCC); Essential hypertension; HLD (hyperlipidemia); Obesity; Pituitary mass (HCC); Tubular adenoma of colon; 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   She  has a past medical history of Cataract, CHF (congestive heart failure) (HCC), Diabetes mellitus without complication (HCC), Hyperlipidemia,  Hypertension, Pituitary adenoma with extrasellar extension (HCC), Sleep apnea, and Thyroid disease..   She presents with chief complaint of Vaginal Itching (Vaginal itching, itching under stomach , rectum , discharge x 3 weeks) .   Discussed the use of AI scribe software for clinical note transcription with the patient, who gave verbal consent to proceed.  History of Present Illness      ROS  Past Surgical History:  Procedure Laterality Date   ABDOMINAL AORTOGRAM W/LOWER EXTREMITY Left 04/11/2023   Procedure: ABDOMINAL AORTOGRAM W/LOWER EXTREMITY;  Surgeon: Victorino Sparrow, MD;  Location: [facility] INVASIVE CV LAB;  Service: Cardiovascular;  Laterality: Left;   ABDOMINAL AORTOGRAM W/LOWER EXTREMITY N/A 05/16/2023   Procedure: ABDOMINAL AORTOGRAM W/LOWER EXTREMITY;  Surgeon: Victorino Sparrow, MD;  Location: [facility] INVASIVE CV LAB;  Service: Cardiovascular;  Laterality: N/A;   AMPUTATION Right 05/18/2023   Procedure: RIGHT GREAT TOE AMPUTATION;  Surgeon: Maeola Harman, MD;  Location: [facility] OR;  Service: Vascular;  Laterality: Right;   BREAST EXCISIONAL BIOPSY Left    benign   BREAST SURGERY     CHOLECYSTECTOMY     PERIPHERAL VASCULAR BALLOON ANGIOPLASTY Left 04/11/2023   Procedure: PERIPHERAL VASCULAR BALLOON ANGIOPLASTY;  Surgeon: Victorino Sparrow, MD;  Location: [facility] INVASIVE CV LAB;  Service: Cardiovascular;  Laterality: Left;  Lt AT   PERIPHERAL VASCULAR INTERVENTION Left 04/11/2023   Procedure: PERIPHERAL VASCULAR INTERVENTION;  Surgeon: Victorino Sparrow, MD;  Location: [facility] INVASIVE CV LAB;  Service: Cardiovascular;  Laterality: Left;  Lt SFA   PERIPHERAL VASCULAR INTERVENTION  05/16/2023   Procedure: PERIPHERAL VASCULAR INTERVENTION;  Surgeon: Victorino Sparrow, MD;  Location: [facility] INVASIVE CV LAB;  Service: Cardiovascular;;   TONSILLECTOMY     TUBAL LIGATION      Outpatient Medications Prior to Visit  Medication Sig Dispense Refill   acetaminophen (TYLENOL) 325 MG tablet Take 2  tablets (650 mg total) by mouth every 4 (four) hours as needed for headache or mild pain. 30 tablet 0   amLODipine (NORVASC) 5 MG tablet Take 1 tablet (5 mg total) by mouth daily. 90 tablet 0   aspirin EC 81 MG tablet Take 81 mg by mouth in the morning.     Blood Glucose Monitoring Suppl (ONETOUCH VERIO) w/Device KIT 1 Device by Does not apply route daily in the afternoon. 1 kit 0   cabergoline (DOSTINEX) 0.5 MG tablet Take 1 tablet (0.5 mg total) by mouth 2 (two) times a week. 26 tablet 3   carvedilol (COREG) 25 MG tablet Take 1 tablet (25 mg total) by mouth 2 (two) times daily with a meal. 90 tablet 3   clopidogrel (PLAVIX) 75 MG tablet Take 75 mg by mouth daily.     fluconazole (DIFLUCAN) 150 MG tablet Take 1 tablet (150 mg total) by mouth every three (3) days as needed. 2 tablet 0   furosemide (LASIX) 40 MG tablet TAKE 1 TABLET BY MOUTH 2 TIMES DAILY FOR 90 DAYS. 180 tablet 3   glucose blood test strip 1 each by Other route 3 (three) times daily. Use as instructed 300 each 12   hydrocortisone 2.5 % ointment SMARTSIG:1 Topical Daily     insulin aspart (NOVOLOG FLEXPEN) 100 UNIT/ML FlexPen Inject 0-6 Units into the skin 3 (three) times daily before meals. Max daily 45 units 45 mL 1   Insulin Glargine (BASAGLAR KWIKPEN) 100 UNIT/ML Inject 54 Units into the skin daily. 60 mL 3   Insulin Pen Needle (PEN NEEDLES) 31G X 5 MM MISC 1 each by Does not apply route in the morning, at noon, in the evening, and at bedtime. 400 each 3   levothyroxine (SYNTHROID) 88 MCG tablet Take 1 tablet (88 mcg total) by mouth daily. 90 tablet 3   losartan (COZAAR) 100 MG tablet Take 1 tablet (100 mg total) by mouth daily. 90 tablet 3   octreotide (SANDOSTATIN) 100 MCG/ML SOLN injection Inject 1 mL (100 mcg total) into the skin 3 (three) times daily. 270 mL 3   potassium chloride (KLOR-CON) 10 MEQ tablet Take 1 tablet (10 mEq total) by mouth daily. 30 tablet 11   simvastatin (ZOCOR) 20 MG tablet TAKE ONE TABLET (20 MG  TOTAL) BY MOUTH DAILY AT 5PM EVERY EVENING 90 tablet 3   SYRINGE-NEEDLE, DISP, 3 ML 25G X 1" 3 ML MISC Inject 1 ml into the skin 3 times daily. 100 each 11   Vitamin D, Cholecalciferol, 25 MCG (1000 UT) CAPS Take 2 capsules by mouth daily. (Patient taking differently: Take 2 capsules by mouth daily with lunch.) 60 capsule 0   No facility-administered medications prior to visit.    Family History  Problem Relation Age of Onset   Thyroid disease Neg Hx    Colon cancer Neg Hx    Esophageal cancer Neg Hx    Rectal cancer Neg Hx    Stomach cancer Neg Hx    BRCA 1/2 Neg Hx    Breast cancer Neg Hx     Social History   Socioeconomic History   Marital status: Single    Spouse name: Not on file   Number of children: Not on file   Years of education: Not on file   Highest education level: Not on file  Occupational History   Not on file  Tobacco Use   Smoking status: Never   Smokeless tobacco: Never  Vaping Use   Vaping  status: Never Used  Substance and Sexual Activity   Alcohol use: Never   Drug use: Never   Sexual activity: Not on file  Other Topics Concern   Not on file  Social History Narrative   Not on file   Social Drivers of Health   Financial Resource Strain: Low Risk  (04/20/2023)   Overall Financial Resource Strain (CARDIA)    Difficulty of Paying Living Expenses: Not hard at all  Food Insecurity: Not on File (08/09/2023)   Received from [facility] Insecurity    Food: 0  Transportation Needs: No Transportation Needs (05/13/2023)   PRAPARE - Administrator, Civil Service (Medical): No    Lack of Transportation (Non-Medical): No  Physical Activity: Inactive (04/20/2023)   Exercise Vital Sign    Days of Exercise per Week: 0 days    Minutes of Exercise per Session: 0 min  Stress: No Stress Concern Present (04/20/2023)   [employer] of Occupational Health - Occupational Stress Questionnaire    Feeling of Stress : Not at all  Social Connections: Not on  File (08/03/2023)   Received from [facility]   Social Connections    Connectedness: 0  Intimate Partner Violence: Not At Risk (05/13/2023)   Humiliation, Afraid, Rape, and Kick questionnaire    Fear of Current or Ex-Partner: No    Emotionally Abused: No    Physically Abused: No    Sexually Abused: No                                                                                                  Objective:  Physical Exam: BP 124/80   Pulse 75   Temp 97.9 F (36.6 C) (Temporal)   Wt 224 lb 3.2 oz (101.7 kg)   SpO2 97%   BMI 35.11 kg/m    Physical Exam    Physical Exam  VAS [place] LOWER EXTREMITY ARTERIAL DUPLEX Result Date: 02/21/2024 LOWER EXTREMITY ARTERIAL DUPLEX STUDY Patient Name:  Danielle Schroeder  Date of Exam:   02/21/2024 Medical Rec #: [id number]          Accession #:    [id number] Date of Birth: Feb 28, 1956          Patient Gender: F Patient Age:   60 years Exam Location:  [facility] Procedure:      VAS [place] LOWER EXTREMITY ARTERIAL DUPLEX Referring Phys: Fanny Bien --------------------------------------------------------------------------------  Indications: Peripheral artery disease.  Vascular Interventions: 04/11/23: Left SFA stent , balloon angioplasty left ATA.                         05/12/23: Right PTA, peroneal balloon angioplasty. Current ABI:            R 0.97 L 1.10 Limitations: body habitus Comparison Study: 06/01/23 Performing Technologist: [place] Ponder RVS  Examination Guidelines: A complete evaluation includes B-mode imaging, spectral Doppler, color Doppler, and power Doppler as needed of all accessible portions of each vessel. Bilateral testing is considered an integral part of a complete examination. Limited examinations for reoccurring  indications may be performed as noted.  +-----------+--------+-----+---------------+----------+--------+ RIGHT      PSV cm/sRatioStenosis       Waveform  Comments  +-----------+--------+-----+---------------+----------+--------+ CFA Distal 143                         triphasic          +-----------+--------+-----+---------------+----------+--------+ DFA        134                         triphasic          +-----------+--------+-----+---------------+----------+--------+ SFA Prox   237          50-74% stenosis                   +-----------+--------+-----+---------------+----------+--------+ SFA Mid    135                         monophasic         +-----------+--------+-----+---------------+----------+--------+ SFA Distal 173          30-49% stenosismonophasic         +-----------+--------+-----+---------------+----------+--------+ POP Prox   112                         monophasic         +-----------+--------+-----+---------------+----------+--------+ POP Distal 88                          monophasic         +-----------+--------+-----+---------------+----------+--------+ ATA Distal 25                          monophasic         +-----------+--------+-----+---------------+----------+--------+ PTA Distal 137                         monophasic         +-----------+--------+-----+---------------+----------+--------+ PERO Distal                                      NWV      +-----------+--------+-----+---------------+----------+--------+  +-----------+--------+-----+---------------+----------+--------+ LEFT       PSV cm/sRatioStenosis       Waveform  Comments +-----------+--------+-----+---------------+----------+--------+ CFA Distal 137                         triphasic          +-----------+--------+-----+---------------+----------+--------+ DFA        124                         triphasic          +-----------+--------+-----+---------------+----------+--------+ SFA Prox   251          50-74% stenosismonophasic          +-----------+--------+-----+---------------+----------+--------+ SFA Mid    237          50-74% stenosismonophasic         +-----------+--------+-----+---------------+----------+--------+ SFA Distal 182          30-49% stenosismonophasic         +-----------+--------+-----+---------------+----------+--------+ POP Prox   142  monophasic         +-----------+--------+-----+---------------+----------+--------+ POP Distal 114                         monophasic         +-----------+--------+-----+---------------+----------+--------+ ATA Distal 65                          monophasic         +-----------+--------+-----+---------------+----------+--------+ PTA Distal 104                         monophasic         +-----------+--------+-----+---------------+----------+--------+ PERO Distal                                      NWV      +-----------+--------+-----+---------------+----------+--------+  Summary: Right: 50-74% stenosis noted in the superficial femoral artery. Left: 50-74% stenosis noted in the superficial femoral artery. Stent walls not visualized.  See table(s) above for measurements and observations. Electronically signed by Gerarda Fraction on 02/21/2024 at 4:48:25 PM.    Final    VAS [place] ABI WITH/WO TBI Result Date: 02/21/2024  LOWER EXTREMITY DOPPLER STUDY Patient Name:  Danielle Schroeder  Date of Exam:   02/21/2024 Medical Rec #: [id number]          Accession #:    [id number] Date of Birth: Jun 21, 1956          Patient Gender: F Patient Age:   58 years Exam Location:  [facility] Procedure:      VAS [place] ABI WITH/WO TBI Referring Phys: JOSHUA ROBINS --------------------------------------------------------------------------------  Indications: Peripheral artery disease.  Vascular Interventions: 04/11/23: Left SFA stent , balloon angioplasty left ATA.                         05/12/23: Right PTA, peroneal balloon angioplasty.  Comparison Study: 06/01/23 Performing Technologist: [place] Ponder RVS  Examination Guidelines: A complete evaluation includes at minimum, Doppler waveform signals and systolic blood pressure reading at the level of bilateral brachial, anterior tibial, and posterior tibial arteries, when vessel segments are accessible. Bilateral testing is considered an integral part of a complete examination. Photoelectric Plethysmograph (PPG) waveforms and toe systolic pressure readings are included as required and additional duplex testing as needed. Limited examinations for reoccurring indications may be performed as noted.  ABI Findings: +--------+------------------+-----+----------+--------+ Right   Rt Pressure (mmHg)IndexWaveform  Comment  +--------+------------------+-----+----------+--------+ [id number]                                       +--------+------------------+-----+----------+--------+ PTA     151               0.97 monophasic         +--------+------------------+-----+----------+--------+ DP      139               0.89 monophasic         +--------+------------------+-----+----------+--------+ +---------+------------------+-----+----------+-------+ Left     Lt Pressure (mmHg)IndexWaveform  Comment +---------+------------------+-----+----------+-------+ Brachial 156                                      +---------+------------------+-----+----------+-------+  PTA      125               0.80 monophasic        +---------+------------------+-----+----------+-------+ DP       172               1.10 monophasic        +---------+------------------+-----+----------+-------+ Great Toe97                0.62                   +---------+------------------+-----+----------+-------+ +-------+-----------+-----------+------------+------------+ ABI/TBIToday's ABIToday's TBIPrevious ABIPrevious TBI +-------+-----------+-----------+------------+------------+ Right  0.97        amp        0.63        amp          +-------+-----------+-----------+------------+------------+ Left   1.10       0.62       0.96        0.59         +-------+-----------+-----------+------------+------------+  Arterial wall calcification precludes accurate ankle pressures and ABIs. Right ABIs appear increased compared to prior study on 06/01/23. Left ABIs appear essentially unchanged compared to prior study on 06/01/23.  Summary: Right: Resting right ankle-brachial index is within normal range. Left: Resting left ankle-brachial index is within normal range. The left toe-brachial index is abnormal. *See table(s) above for measurements and observations.  Electronically signed by Gerarda Fraction on 02/21/2024 at 4:48:05 PM.    Final    MM 3D DIAGNOSTIC MAMMOGRAM UNILATERAL LEFT BREAST Result Date: 11/28/2023 CLINICAL DATA:  68 year old female presents for further evaluation of possible LEFT breast asymmetry on screening mammogram. EXAM: DIGITAL DIAGNOSTIC UNILATERAL LEFT MAMMOGRAM WITH TOMOSYNTHESIS AND CAD; ULTRASOUND LEFT BREAST LIMITED TECHNIQUE: Left digital diagnostic mammography and breast tomosynthesis was performed. The images were evaluated with computer-aided detection. ; Targeted ultrasound examination of the left breast was performed. COMPARISON:  Previous exam(s). ACR Breast Density Category b: There are scattered areas of fibroglandular density. FINDINGS: Full field and spot compression views of the LEFT breast demonstrate no persistent suspicious abnormality at the site of the screening study finding. Targeted ultrasound is performed, showing no sonographic abnormality within the OUTER LEFT breast. IMPRESSION: No persistent mammographic or sonographic abnormality at the site of the screening study finding. RECOMMENDATION: Bilateral screening mammogram in 1 year. I have discussed the findings and recommendations with the patient. If applicable, a reminder letter will be sent to the patient  regarding the next appointment. BI-RADS CATEGORY  1: Negative. Electronically Signed   By: Harmon Pier M.D.   On: 11/28/2023 09:37   [place] LIMITED ULTRASOUND INCLUDING AXILLA LEFT BREAST  Result Date: 11/28/2023 CLINICAL DATA:  68 year old female presents for further evaluation of possible LEFT breast asymmetry on screening mammogram. EXAM: DIGITAL DIAGNOSTIC UNILATERAL LEFT MAMMOGRAM WITH TOMOSYNTHESIS AND CAD; ULTRASOUND LEFT BREAST LIMITED TECHNIQUE: Left digital diagnostic mammography and breast tomosynthesis was performed. The images were evaluated with computer-aided detection. ; Targeted ultrasound examination of the left breast was performed. COMPARISON:  Previous exam(s). ACR Breast Density Category b: There are scattered areas of fibroglandular density. FINDINGS: Full field and spot compression views of the LEFT breast demonstrate no persistent suspicious abnormality at the site of the screening study finding. Targeted ultrasound is performed, showing no sonographic abnormality within the OUTER LEFT breast. IMPRESSION: No persistent mammographic or sonographic abnormality at the site of the screening study finding. RECOMMENDATION: Bilateral screening mammogram in 1 year. I have discussed the findings  and recommendations with the patient. If applicable, a reminder letter will be sent to the patient regarding the next appointment. BI-RADS CATEGORY  1: Negative. Electronically Signed   By: Harmon Pier M.D.   On: 11/28/2023 09:37    Recent Results (from the past 2160 hours)  POCT glycosylated hemoglobin (Hb A1C)     Status: Abnormal   Collection Time: 01/23/24 12:05 PM  Result Value Ref Range   Hemoglobin A1C 8.4 (A) 4.0 - 5.6 %   HbA1c POC (<> result, manual entry)     HbA1c, POC (prediabetic range)     HbA1c, POC (controlled diabetic range)    T4, free     Status: None   Collection Time: 01/23/24 12:38 PM  Result Value Ref Range   Free T4 1.3 0.8 - 1.8 ng/dL  Lipid panel     Status: None    Collection Time: 01/23/24 12:38 PM  Result Value Ref Range   Cholesterol 151 <200 mg/dL   HDL 75 > OR = 50 mg/dL   Triglycerides 92 <161 mg/dL   LDL Cholesterol (Calc) 58 mg/dL (calc)    Comment: Reference range: <100 . Desirable range <100 mg/dL for primary prevention;   <70 mg/dL for patients with CHD or diabetic patients  with > or = 2 CHD risk factors. Marland Kitchen LDL-C is now calculated using the Martin-Hopkins  calculation, which is a validated novel method providing  better accuracy than the Friedewald equation in the  estimation of LDL-C.  Horald Pollen et al. Lenox Ahr. 0960;454(09): 2061-2068  (http://education.[facility].com/faq/FAQ164)    Total CHOL/HDL Ratio 2.0 <5.0 (calc)   Non-HDL Cholesterol (Calc) 76 <811 mg/dL (calc)    Comment: For patients with diabetes plus 1 major ASCVD risk  factor, treating to a non-HDL-C goal of <100 mg/dL  (LDL-C of <91 mg/dL) is considered a therapeutic  option.   Microalbumin / creatinine urine ratio     Status: Abnormal   Collection Time: 01/23/24 12:38 PM  Result Value Ref Range   Creatinine, Urine 26 20 - 275 mg/dL   Microalb, Ur 1.1 mg/dL    Comment: Reference Range Not established    Microalb Creat Ratio 42 (H) <30 mg/g creat    Comment: . The ADA defines abnormalities in albumin excretion as follows: Marland Kitchen Albuminuria Category        Result (mg/g creatinine) . Normal to Mildly increased   <30 Moderately increased         30-299  Severely increased           > OR = 300 . The ADA recommends that at least two of three specimens collected within a 3-6 month period be abnormal before considering a patient to be within a diagnostic category.   Insulin-like growth factor     Status: Abnormal   Collection Time: 01/23/24 12:38 PM  Result Value Ref Range   IGF-I, LC/MS 840 (H) 41 - 279 ng/mL   Z-Score (Female) 5.0 (H) -2.0 - 2.0 SD    Comment: . This test was developed and its analytical performance characteristics have been determined  by [employer]. It has not been cleared or approved by the FDA. This assay has been validated pursuant to the CLIA regulations and is used for clinical purposes. Marland Kitchen   VAS [place] ABI WITH/WO TBI     Status: None   Collection Time: 02/21/24  3:04 PM  Result Value Ref Range   Right ABI 0.97    Left ABI 1.10  Carnell Christian, MD, MS

## 2024-02-27 LAB — CERVICOVAGINAL ANCILLARY ONLY
Bacterial Vaginitis (gardnerella): NEGATIVE
Candida Glabrata: NEGATIVE
Candida Vaginitis: NEGATIVE
Chlamydia: NEGATIVE
Comment: NEGATIVE
Comment: NEGATIVE
Comment: NEGATIVE
Comment: NEGATIVE
Comment: NEGATIVE
Comment: NORMAL
Neisseria Gonorrhea: NEGATIVE
Trichomonas: NEGATIVE

## 2024-03-21 ENCOUNTER — Ambulatory Visit: Admitting: Podiatry

## 2024-04-05 ENCOUNTER — Other Ambulatory Visit: Payer: Self-pay | Admitting: Family Medicine

## 2024-04-05 DIAGNOSIS — L304 Erythema intertrigo: Secondary | ICD-10-CM

## 2024-04-05 DIAGNOSIS — I872 Venous insufficiency (chronic) (peripheral): Secondary | ICD-10-CM

## 2024-04-05 DIAGNOSIS — N898 Other specified noninflammatory disorders of vagina: Secondary | ICD-10-CM

## 2024-04-09 ENCOUNTER — Encounter: Payer: Self-pay | Admitting: Podiatry

## 2024-04-09 ENCOUNTER — Ambulatory Visit (INDEPENDENT_AMBULATORY_CARE_PROVIDER_SITE_OTHER): Admitting: Podiatry

## 2024-04-09 DIAGNOSIS — M79671 Pain in right foot: Secondary | ICD-10-CM

## 2024-04-09 DIAGNOSIS — E1151 Type 2 diabetes mellitus with diabetic peripheral angiopathy without gangrene: Secondary | ICD-10-CM

## 2024-04-09 DIAGNOSIS — B351 Tinea unguium: Secondary | ICD-10-CM | POA: Diagnosis not present

## 2024-04-09 DIAGNOSIS — M79672 Pain in left foot: Secondary | ICD-10-CM

## 2024-04-09 DIAGNOSIS — I70209 Unspecified atherosclerosis of native arteries of extremities, unspecified extremity: Secondary | ICD-10-CM

## 2024-04-09 DIAGNOSIS — L600 Ingrowing nail: Secondary | ICD-10-CM

## 2024-04-09 NOTE — Progress Notes (Signed)
 Patient presents for evaluation and treatment of tenderness and some redness around nails feet.  Tenderness around toes with walking and wearing shoes.  Physical exam:  General appearance: Alert, pleasant, and in no acute distress.  Vascular: Pedal pulses: DP diminished bilaterally, PT palpable bilaterally.  Severe edema lower legs bilaterally  Neurological:  No paresthesias or burning noted.  Dermatologic:  Nails thickened, disfigured, discolored 1-5 BL with subungual debris.  Redness and hypertrophic nail folds along nail folds bilaterally but no signs of drainage or infection.  Musculoskeletal:  Hammertoes 2 through 5 bilaterally.  Status post amputation hallux right   Diagnosis: 1. Painful onychomycotic nails 1 through 5 bilaterally, except hallux right 2. Pain toes 1 through 5 bilaterally. 3.  Ingrown nails bilaterally 3.  Diabetes mellitus type 2 with PVD  Plan: Debrided onychomycotic nails 1 through 5 bilaterally.  Return 3 months

## 2024-04-10 ENCOUNTER — Ambulatory Visit: Payer: Medicare HMO | Admitting: Family Medicine

## 2024-04-11 ENCOUNTER — Other Ambulatory Visit

## 2024-04-25 ENCOUNTER — Ambulatory Visit (INDEPENDENT_AMBULATORY_CARE_PROVIDER_SITE_OTHER): Payer: Medicare HMO

## 2024-04-25 DIAGNOSIS — Z0000 Encounter for general adult medical examination without abnormal findings: Secondary | ICD-10-CM

## 2024-04-25 NOTE — Progress Notes (Signed)
 Subjective:   Danielle Schroeder is a 69 y.o. who presents for a Medicare Wellness preventive visit.  As a reminder, Annual Wellness Visits don't include a physical exam, and some assessments may be limited, especially if this visit is performed virtually. We may recommend an in-person follow-up visit with your provider if needed.  Visit Complete: Virtual I connected with  Danielle Schroeder on 04/25/24 by a audio enabled telemedicine application and verified that I am speaking with the correct person using two identifiers.  Patient Location: Home  Provider Location: Office/Clinic  I discussed the limitations of evaluation and management by telemedicine. The patient expressed understanding and agreed to proceed.  Vital Signs: Because this visit was a virtual/telehealth visit, some criteria may be missing or patient reported. Any vitals not documented were not able to be obtained and vitals that have been documented are patient reported.  VideoError- Librarian, academic were attempted between this provider and patient, however failed, due to patient having technical difficulties OR patient did not have access to video capability.  We continued and completed visit with audio only.   Persons Participating in Visit: Patient.  AWV Questionnaire: No: Patient Medicare AWV questionnaire was not completed prior to this visit.  Cardiac Risk Factors include: advanced age (>28men, >84 women);diabetes mellitus;dyslipidemia;hypertension     Objective:    Today's Vitals   There is no height or weight on file to calculate BMI.     04/25/2024   11:30 AM 05/14/2023   11:55 AM 05/13/2023    6:00 AM 05/12/2023    7:15 PM 04/20/2023   11:19 AM 04/11/2023    8:44 AM 07/30/2020    3:01 PM  Advanced Directives  Does Patient Have a Medical Advance Directive? No No No No No No No  Would patient like information on creating a medical advance directive? No - Patient declined Yes  (Inpatient - patient defers creating a medical advance directive at this time - Information given) Yes (Inpatient - patient requests chaplain consult to create a medical advance directive)   Yes (Inpatient - patient defers creating a medical advance directive at this time - Information given) No - Patient declined    Current Medications (verified) Outpatient Encounter Medications as of 04/25/2024  Medication Sig   acetaminophen  (TYLENOL ) 325 MG tablet Take 2 tablets (650 mg total) by mouth every 4 (four) hours as needed for headache or mild pain.   amLODipine  (NORVASC ) 5 MG tablet Take 1 tablet (5 mg total) by mouth daily.   aspirin  EC 81 MG tablet Take 81 mg by mouth in the morning.   Blood Glucose Monitoring Suppl (ONETOUCH VERIO) w/Device KIT 1 Device by Does not apply route daily in the afternoon.   cabergoline  (DOSTINEX ) 0.5 MG tablet Take 1 tablet (0.5 mg total) by mouth 2 (two) times a week.   carvedilol  (COREG ) 25 MG tablet Take 1 tablet (25 mg total) by mouth 2 (two) times daily with a meal.   clopidogrel  (PLAVIX ) 75 MG tablet Take 75 mg by mouth daily.   clotrimazole  (LOTRIMIN ) 1 % cream APPLY TO AFFECTED AREA TWICE A DAY   fluconazole  (DIFLUCAN ) 150 MG tablet Take 1 tablet (150 mg total) by mouth every three (3) days as needed.   furosemide  (LASIX ) 40 MG tablet TAKE 1 TABLET BY MOUTH 2 TIMES DAILY FOR 90 DAYS.   glucose blood test strip 1 each by Other route 3 (three) times daily. Use as instructed   hydrocortisone  2.5 %  ointment APPLY TOPICALLY 2 (TWO) TIMES DAILY. AS NEEDED FOR MILD ECZEMA. DO NOT USE FOR MORE THAN 1-2 WEEKS AT A TIME.   insulin  aspart (NOVOLOG  FLEXPEN) 100 UNIT/ML FlexPen Inject 0-6 Units into the skin 3 (three) times daily before meals. Max daily 45 units   Insulin  Glargine (BASAGLAR  KWIKPEN) 100 UNIT/ML Inject 54 Units into the skin daily.   Insulin  Pen Needle (PEN NEEDLES) 31G X 5 MM MISC 1 each by Does not apply route in the morning, at noon, in the evening, and  at bedtime.   levothyroxine  (SYNTHROID ) 88 MCG tablet Take 1 tablet (88 mcg total) by mouth daily.   losartan  (COZAAR ) 100 MG tablet Take 1 tablet (100 mg total) by mouth daily.   octreotide  (SANDOSTATIN ) 100 MCG/ML SOLN injection Inject 1 mL (100 mcg total) into the skin 3 (three) times daily.   potassium chloride  (KLOR-CON ) 10 MEQ tablet Take 1 tablet (10 mEq total) by mouth daily.   simvastatin  (ZOCOR ) 20 MG tablet TAKE ONE TABLET (20 MG TOTAL) BY MOUTH DAILY AT 5PM EVERY EVENING   SYRINGE-NEEDLE, DISP, 3 ML 25G X 1 3 ML MISC Inject 1 ml into the skin 3 times daily.   Vitamin D , Cholecalciferol , 25 MCG (1000 UT) CAPS Take 2 capsules by mouth daily.   No facility-administered encounter medications on file as of 04/25/2024.    Allergies (verified) Patient has no known allergies.   History: Past Medical History:  Diagnosis Date   Cataract    bilateral lens implants   CHF (congestive heart failure) (HCC)    Diabetes mellitus without complication (HCC)    Hyperlipidemia    Hypertension    Pituitary adenoma with extrasellar extension (HCC)    Sleep apnea    no c-pap   Thyroid  disease    hypothyroidism   Past Surgical History:  Procedure Laterality Date   ABDOMINAL AORTOGRAM W/LOWER EXTREMITY Left 04/11/2023   Procedure: ABDOMINAL AORTOGRAM W/LOWER EXTREMITY;  Surgeon: Kayla Part, MD;  Location: [facility] INVASIVE CV LAB;  Service: Cardiovascular;  Laterality: Left;   ABDOMINAL AORTOGRAM W/LOWER EXTREMITY N/A 05/16/2023   Procedure: ABDOMINAL AORTOGRAM W/LOWER EXTREMITY;  Surgeon: Kayla Part, MD;  Location: [facility] INVASIVE CV LAB;  Service: Cardiovascular;  Laterality: N/A;   AMPUTATION Right 05/18/2023   Procedure: RIGHT GREAT TOE AMPUTATION;  Surgeon: Adine Hoof, MD;  Location: [facility] OR;  Service: Vascular;  Laterality: Right;   BREAST EXCISIONAL BIOPSY Left    benign   BREAST SURGERY     CHOLECYSTECTOMY     PERIPHERAL VASCULAR BALLOON ANGIOPLASTY Left 04/11/2023    Procedure: PERIPHERAL VASCULAR BALLOON ANGIOPLASTY;  Surgeon: Kayla Part, MD;  Location: [facility] INVASIVE CV LAB;  Service: Cardiovascular;  Laterality: Left;  Lt AT   PERIPHERAL VASCULAR INTERVENTION Left 04/11/2023   Procedure: PERIPHERAL VASCULAR INTERVENTION;  Surgeon: Kayla Part, MD;  Location: [facility] INVASIVE CV LAB;  Service: Cardiovascular;  Laterality: Left;  Lt SFA   PERIPHERAL VASCULAR INTERVENTION  05/16/2023   Procedure: PERIPHERAL VASCULAR INTERVENTION;  Surgeon: Kayla Part, MD;  Location: [facility] INVASIVE CV LAB;  Service: Cardiovascular;;   TONSILLECTOMY     TUBAL LIGATION     Family History  Problem Relation Age of Onset   Thyroid  disease Neg Hx    Colon cancer Neg Hx    Esophageal cancer Neg Hx    Rectal cancer Neg Hx    Stomach cancer Neg Hx    BRCA 1/2 Neg Hx    Breast cancer  Neg Hx    Social History   Socioeconomic History   Marital status: Single    Spouse name: Not on file   Number of children: Not on file   Years of education: Not on file   Highest education level: Not on file  Occupational History   Not on file  Tobacco Use   Smoking status: Never   Smokeless tobacco: Never  Vaping Use   Vaping status: Never Used  Substance and Sexual Activity   Alcohol use: Never   Drug use: Never   Sexual activity: Not on file  Other Topics Concern   Not on file  Social History Narrative   Not on file   Social Drivers of Health   Financial Resource Strain: Low Risk  (04/25/2024)   Overall Financial Resource Strain (CARDIA)    Difficulty of Paying Living Expenses: Not hard at all  Food Insecurity: No Food Insecurity (04/25/2024)   Hunger Vital Sign    Worried About Running Out of Food in the Last Year: Never true    Ran Out of Food in the Last Year: Never true  Transportation Needs: No Transportation Needs (04/25/2024)   PRAPARE - Administrator, Civil Service (Medical): No    Lack of Transportation (Non-Medical): No  Physical Activity:  Inactive (04/25/2024)   Exercise Vital Sign    Days of Exercise per Week: 0 days    Minutes of Exercise per Session: 0 min  Stress: No Stress Concern Present (04/25/2024)   [employer] of Occupational Health - Occupational Stress Questionnaire    Feeling of Stress: Not at all  Social Connections: Moderately Isolated (04/25/2024)   Social Connection and Isolation Panel    Frequency of Communication with Friends and Family: Twice a week    Frequency of Social Gatherings with Friends and Family: Twice a week    Attends Religious Services: 1 to 4 times per year    Active Member of [employer] or Organizations: No    Attends Engineer, structural: Never    Marital Status: Divorced    Tobacco Counseling Counseling given: Not Answered    Clinical Intake:  Pre-visit preparation completed: Yes  Pain : No/denies pain     Nutritional Risks: None Diabetes: Yes CBG done?: No Did pt. bring in CBG monitor from home?: No  Lab Results  Component Value Date   HGBA1C 8.4 (A) 01/23/2024   HGBA1C 12.2 (H) 05/13/2023   HGBA1C 12.6 (A) 01/16/2023     How often do you need to have someone help you when you read instructions, pamphlets, or other written materials from your doctor or pharmacy?: 1 - Never  Interpreter Needed?: No  Information entered by :: NAllen LPN   Activities of Daily Living     04/25/2024   11:25 AM 05/13/2023    6:00 AM  In your present state of health, do you have any difficulty performing the following activities:  Hearing? 0 0  Vision? 0 0  Difficulty concentrating or making decisions? 0 0  Walking or climbing stairs? 1 1  Dressing or bathing? 0 0  Doing errands, shopping? 0 0  Preparing Food and eating ? N   Using the Toilet? N   In the past six months, have you accidently leaked urine? N   Do you have problems with loss of bowel control? N   Managing your Medications? N   Managing your Finances? N   Housekeeping or managing your Housekeeping? N  Patient Care Team: Catheryn Cluck, MD as PCP - General (Family Medicine) Hugh Madura, MD as PCP - Cardiology (Cardiology)  I have updated your Care Teams any recent Medical Services you may have received from other providers in the past year.     Assessment:   This is a routine wellness examination for Danielle Schroeder.  Hearing/Vision screen Hearing Screening - Comments:: Denies hearing issues Vision Screening - Comments:: Regular eye exams, Dr. Seward Dao   Goals Addressed             This Visit's Progress    Patient Stated       04/25/2024, wants to lose weight       Depression Screen     04/25/2024   11:32 AM 08/08/2023   10:20 AM 04/20/2023   11:20 AM 04/16/2023    1:55 PM 04/16/2023    1:48 PM 03/05/2023   10:17 AM  PHQ 2/9 Scores  PHQ - 2 Score 0 0 0 3 3 1   PHQ- 9 Score 0 0  11 11 10   Exception Documentation     Patient refusal     Fall Risk     04/25/2024   11:31 AM 04/20/2023   11:20 AM 03/05/2023   10:17 AM  Fall Risk   Falls in the past year? 0 0 0  Number falls in past yr: 0 0 0  Injury with Fall? 0 0 0  Risk for fall due to : Medication side effect Medication side effect;Impaired mobility;Impaired balance/gait   Follow up Falls evaluation completed;Falls prevention discussed Falls prevention discussed;Education provided;Falls evaluation completed     MEDICARE RISK AT HOME:  Medicare Risk at Home Any stairs in or around the home?: Yes If so, are there any without handrails?: No Home free of loose throw rugs in walkways, pet beds, electrical cords, etc?: Yes Adequate lighting in your home to reduce risk of falls?: Yes Life alert?: No Use of a cane, walker or w/c?: Yes Grab bars in the bathroom?: No Shower chair or bench in shower?: No Elevated toilet seat or a handicapped toilet?: No  TIMED UP AND GO:  Was the test performed?  No  Cognitive Function: 6CIT completed        04/25/2024   11:32 AM 04/20/2023   11:23 AM  6CIT Screen  What Year? 0  points 0 points  What month? 0 points 0 points  What time? 0 points 0 points  Count back from 20 0 points 0 points  Months in reverse 2 points 2 points  Repeat phrase 0 points 0 points  Total Score 2 points 2 points    Immunizations  There is no immunization history on file for this patient.  Screening Tests Health Maintenance  Topic Date Due   COVID-19 Vaccine (1) Never done   Pneumococcal Vaccine: 50+ Years (1 of 2 - PCV) Never done   Zoster Vaccines- Shingrix (1 of 2) Never done   DEXA SCAN  Never done   Colonoscopy  04/22/2023   OPHTHALMOLOGY EXAM  06/23/2023   FOOT EXAM  01/16/2024   Diabetic kidney evaluation - eGFR measurement  06/06/2024   INFLUENZA VACCINE  06/13/2024   HEMOGLOBIN A1C  07/25/2024   Diabetic kidney evaluation - Urine ACR  01/22/2025   Medicare Annual Wellness (AWV)  04/25/2025   MAMMOGRAM  10/30/2025   Hepatitis C Screening  Completed   HPV VACCINES  Aged Out   Meningococcal B Vaccine  Aged Out   DTaP/Tdap/Td  Discontinued    Health Maintenance  Health Maintenance Due  Topic Date Due   COVID-19 Vaccine (1) Never done   Pneumococcal Vaccine: 50+ Years (1 of 2 - PCV) Never done   Zoster Vaccines- Shingrix (1 of 2) Never done   DEXA SCAN  Never done   Colonoscopy  04/22/2023   OPHTHALMOLOGY EXAM  06/23/2023   FOOT EXAM  01/16/2024   Health Maintenance Items Addressed: Declines vaccines. Patient states she will call to reschedule DEXA. Feet will be addressed at next appointment  Additional Screening:  Vision Screening: Recommended annual ophthalmology exams for early detection of glaucoma and other disorders of the eye. Would you like a referral to an eye doctor? No    Dental Screening: Recommended annual dental exams for proper oral hygiene  Community Resource Referral / Chronic Care Management: CRR required this visit?  No   CCM required this visit?  No   Plan:    I have personally reviewed and noted the following in the  patient's chart:   Medical and social history Use of alcohol, tobacco or illicit drugs  Current medications and supplements including opioid prescriptions. Patient is not currently taking opioid prescriptions. Functional ability and status Nutritional status Physical activity Advanced directives List of other physicians Hospitalizations, surgeries, and ER visits in previous 12 months Vitals Screenings to include cognitive, depression, and falls Referrals and appointments  In addition, I have reviewed and discussed with patient certain preventive protocols, quality metrics, and best practice recommendations. A written personalized care plan for preventive services as well as general preventive health recommendations were provided to patient.   Areatha Beecham, LPN   1/61/0960   After Visit Summary: (Pick Up) Due to this being a telephonic visit, with patients personalized plan was offered to patient and patient has requested to Pick up at office.  Notes: Nothing significant to report at this time.

## 2024-04-25 NOTE — Patient Instructions (Signed)
 Danielle Schroeder , Thank you for taking time out of your busy schedule to complete your Annual Wellness Visit with me. I enjoyed our conversation and look forward to speaking with you again next year. I, as well as your care team,  appreciate your ongoing commitment to your health goals. Please review the following plan we discussed and let me know if I can assist you in the future. Your Game plan/ To Do List    Referrals: If you haven't heard from the office you've been referred to, please reach out to them at the phone provided.  N/a Follow up Visits: Next Medicare AWV with our clinical staff: 04/27/2025 at 11:30   Have you seen your provider in the last 6 months (3 months if uncontrolled diabetes)? Yes Next Office Visit with your provider: 05/21/2024 at 10:40  Clinician Recommendations:  Aim for 30 minutes of exercise or brisk walking, 6-8 glasses of water, and 5 servings of fruits and vegetables each day.       This is a list of the screening recommended for you and due dates:  Health Maintenance  Topic Date Due   COVID-19 Vaccine (1) Never done   Pneumococcal Vaccine for age over 36 (1 of 2 - PCV) Never done   Zoster (Shingles) Vaccine (1 of 2) Never done   DEXA scan (bone density measurement)  Never done   Colon Cancer Screening  04/22/2023   Eye exam for diabetics  06/23/2023   Complete foot exam   01/16/2024   Yearly kidney function blood test for diabetes  06/06/2024   Flu Shot  06/13/2024   Hemoglobin A1C  07/25/2024   Yearly kidney health urinalysis for diabetes  01/22/2025   Medicare Annual Wellness Visit  04/25/2025   Mammogram  10/30/2025   Hepatitis C Screening  Completed   HPV Vaccine  Aged Out   Meningitis B Vaccine  Aged Out   DTaP/Tdap/Td vaccine  Discontinued    Advanced directives: (ACP Link)Information on Advanced Care Planning can be found at [place]  Secretary of [facility] Advance Health Care Directives Advance Health Care Directives. [email or web address]  Advance Care  Planning is important because it:  [x]  Makes sure you receive the medical care that is consistent with your values, goals, and preferences  [x]  It provides guidance to your family and loved ones and reduces their decisional burden about whether or not they are making the right decisions based on your wishes.  Follow the link provided in your after visit summary or read over the paperwork we have mailed to you to help you started getting your Advance Directives in place. If you need assistance in completing these, please reach out to us  so that we can help you!  See attachments for Preventive Care and Fall Prevention Tips.

## 2024-04-28 ENCOUNTER — Ambulatory Visit: Admitting: Family Medicine

## 2024-05-12 ENCOUNTER — Other Ambulatory Visit

## 2024-05-13 DIAGNOSIS — H43822 Vitreomacular adhesion, left eye: Secondary | ICD-10-CM | POA: Diagnosis not present

## 2024-05-13 DIAGNOSIS — E113513 Type 2 diabetes mellitus with proliferative diabetic retinopathy with macular edema, bilateral: Secondary | ICD-10-CM | POA: Diagnosis not present

## 2024-05-13 LAB — HM DIABETES EYE EXAM

## 2024-05-20 ENCOUNTER — Encounter: Payer: Self-pay | Admitting: Family Medicine

## 2024-05-21 ENCOUNTER — Ambulatory Visit: Admitting: Family Medicine

## 2024-06-03 ENCOUNTER — Ambulatory Visit: Admitting: Internal Medicine

## 2024-06-09 ENCOUNTER — Ambulatory Visit
Admission: RE | Admit: 2024-06-09 | Discharge: 2024-06-09 | Disposition: A | Discharge status: Home / Self Care | Source: Ambulatory Visit | Attending: Internal Medicine | Admitting: Internal Medicine

## 2024-06-09 DIAGNOSIS — E042 Nontoxic multinodular goiter: Secondary | ICD-10-CM | POA: Diagnosis not present

## 2024-06-10 ENCOUNTER — Telehealth: Payer: Self-pay | Admitting: Pharmacy Technician

## 2024-06-10 NOTE — Progress Notes (Signed)
   06/10/2024  Patient ID: Danielle Schroeder, female   DOB: 1956/02/13, 68 y.o.   MRN: [id number]  Patient engaged with clinical pharmacist for management of diabetes on 05/26/2023. Outreach by [employer] technician was requested.   Outreached patient to discuss diabetes medication management. Voicemail unable to be left as patient's voicemail box is full per automated message.  Danielle Schroeder, CPhT [facility] Office: [phone] Email: [email or web address]

## 2024-06-12 ENCOUNTER — Telehealth: Payer: Self-pay | Admitting: Pharmacy Technician

## 2024-06-12 NOTE — Progress Notes (Signed)
   06/12/2024  Patient ID: Danielle Schroeder, female   DOB: [birth year], 68 y.o.   MRN: [id number]  Patient engaged with clinical pharmacist for management of diabetes on 05/29/2023. Outreach by [employer] technician was requested.   Outreached patient to discuss diabetes medication management. Unable to leave voicemail as voicemail box is full.  Hillel Card, CPhT [facility] Office: [phone] Email: [email or web address]

## 2024-06-16 ENCOUNTER — Telehealth: Payer: Self-pay | Admitting: Pharmacy Technician

## 2024-06-16 NOTE — Progress Notes (Signed)
   06/16/2024  Patient ID: Danielle Schroeder, female   DOB: 11-May-1956, 68 y.o.   MRN: [id number]  Patient engaged with clinical pharmacist for management of diabetes on 05/29/2023. Outreach by [employer] technician was requested.   Outreached patient to discuss diabetes medication management. Unable to leave voicemail as voicemail box is full.  Zayvian Mcmurtry, CPhT [facility] Office: [phone] Email: [email or web address]

## 2024-06-17 ENCOUNTER — Ambulatory Visit: Payer: Self-pay | Admitting: Internal Medicine

## 2024-06-25 ENCOUNTER — Telehealth: Payer: Self-pay

## 2024-06-25 NOTE — Progress Notes (Signed)
   06/25/2024  Patient ID: Danielle Schroeder, female   DOB: 1956/06/28, 68 y.o.   MRN: [id number]  This patient is appearing on a report for being at risk of failing the adherence measure for hypertension (ACEi/ARB) medications this calendar year.   Medication: losartan  100mg   Last fill date: 01/29/2024 for 90 day supply  Attempted to contact patient to see if she is needing a refill for losartan  100mg , but I was not able to reach her; and her voicemail was full.  Patient also meeting criteria for TNM: DM with A1c >7%.  I will attempt to reach out to her again in a week or two to check on medication adherence and diabetes management.  Danielle Schroeder, PharmD, DPLA

## 2024-06-30 ENCOUNTER — Other Ambulatory Visit: Payer: Self-pay | Admitting: Family Medicine

## 2024-07-04 ENCOUNTER — Other Ambulatory Visit: Payer: Self-pay | Admitting: Family Medicine

## 2024-07-04 MED ORDER — FUROSEMIDE 40 MG PO TABS: 40.0000 mg | ORAL_TABLET | Freq: Two times a day (BID) | ORAL | 3 refills | Status: AC

## 2024-07-04 NOTE — Telephone Encounter (Signed)
 Copied from CRM [id number]. Topic: Clinical - Medication Refill >> Jul 04, 2024 11:52 AM Vena HERO wrote: Medication: furosemide  (LASIX ) 40 MG tablet  Has the patient contacted their pharmacy? Yes (Agent: If no, request that the patient contact the pharmacy for the refill. If patient does not wish to contact the pharmacy document the reason why and proceed with request.) (Agent: If yes, when and what did the pharmacy advise?) [facility] called in refill  This is the patient's preferred pharmacy:   [facility] [place] [address] [place] [address] Phone: [phone] Fax: [phone]  Is this the correct pharmacy for this prescription? Yes If no, delete pharmacy and type the correct one.   Has the prescription been filled recently? No  Is the patient out of the medication? No  Has the patient been seen for an appointment in the last year OR does the patient have an upcoming appointment? Yes  Can we respond through MyChart? No  Agent: Please be advised that Rx refills may take up to 3 business days. We ask that you follow-up with your pharmacy.

## 2024-07-04 NOTE — Telephone Encounter (Signed)
RX sent to pharmacy. Dm/cma

## 2024-07-07 ENCOUNTER — Other Ambulatory Visit: Payer: Self-pay

## 2024-07-07 NOTE — Progress Notes (Unsigned)
   07/07/2024  Patient ID: Danielle Schroeder, female   DOB: 1956-06-12, 68 y.o.   MRN: [id number]  Outreach attempt to check in with patient in regard to control of diabetes was not successful.  Me nor the population health team CPhT have been able to successfully make contact with the patient after several attempts, and patient's voicemail is full (so message cannot be left).  Patient sees PCP again 9/10 and Dr. Sam with endocrinology again 9/16.    Channing DELENA Mealing, PharmD, DPLA

## 2024-07-10 ENCOUNTER — Ambulatory Visit (INDEPENDENT_AMBULATORY_CARE_PROVIDER_SITE_OTHER): Admitting: Podiatry

## 2024-07-10 ENCOUNTER — Encounter: Payer: Self-pay | Admitting: Podiatry

## 2024-07-10 DIAGNOSIS — B351 Tinea unguium: Secondary | ICD-10-CM

## 2024-07-10 DIAGNOSIS — M79672 Pain in left foot: Secondary | ICD-10-CM

## 2024-07-10 DIAGNOSIS — M79671 Pain in right foot: Secondary | ICD-10-CM

## 2024-07-10 NOTE — Progress Notes (Signed)
 Patient presents for evaluation and treatment of tenderness and some redness around nails feet.  Tenderness around toes with walking and wearing shoes.  Physical exam:  General appearance: Alert, pleasant, and in no acute distress.  Vascular: Pedal pulses: DP 1/4 B/L, PT 0/4 B/L. Severe edema lower legs bilaterally  Neurological:    Dermatologic:  Nails thickened, disfigured, discolored 2-5 BL and hallux left with subungual debris.  Redness and hypertrophic nail folds along nail folds bilaterally but no signs of drainage or infection.  Musculoskeletal:  Status post amputation great toe right   Diagnosis: 1. Painful onychomycotic nails 2 through 5 bilaterally and hallux left  2. Pain toes 2 through 5 bilaterally.  Plan: Debrided onychomycotic nails 1 through 5 bilaterally and hallux left.  Sharply debrided nails with nail clippers and reduced with a power bur..  Return 3 months [facility]

## 2024-07-16 ENCOUNTER — Other Ambulatory Visit: Payer: Self-pay | Admitting: Family Medicine

## 2024-07-16 DIAGNOSIS — I509 Heart failure, unspecified: Secondary | ICD-10-CM

## 2024-07-17 ENCOUNTER — Other Ambulatory Visit: Payer: Self-pay

## 2024-07-18 ENCOUNTER — Other Ambulatory Visit: Payer: Self-pay | Admitting: Family Medicine

## 2024-07-18 DIAGNOSIS — I509 Heart failure, unspecified: Secondary | ICD-10-CM

## 2024-07-18 DIAGNOSIS — N898 Other specified noninflammatory disorders of vagina: Secondary | ICD-10-CM

## 2024-07-18 DIAGNOSIS — L304 Erythema intertrigo: Secondary | ICD-10-CM

## 2024-07-23 ENCOUNTER — Ambulatory Visit: Admitting: Family Medicine

## 2024-07-29 ENCOUNTER — Ambulatory Visit: Admitting: Internal Medicine

## 2024-08-01 ENCOUNTER — Other Ambulatory Visit: Payer: Self-pay | Admitting: Family Medicine

## 2024-08-01 NOTE — Telephone Encounter (Signed)
 Copied from CRM #[id number]. Topic: Clinical - Medication Refill >> Aug 01, 2024  2:52 PM Franky GRADE wrote: Medication: furosemide  (LASIX ) 40 MG tablet [[id number]]  Has the patient contacted their pharmacy? Yes, [facility] calling [facility] the request.  (Agent: If no, request that the patient contact the pharmacy for the refill. If patient does not wish to contact the pharmacy document the reason why and proceed with request.) (Agent: If yes, when and what did the pharmacy advise?)  This is the patient's preferred pharmacy:    [facility] [place] [address] [place] [address] Phone: [phone] Fax: [phone]  Is this the correct pharmacy for this prescription? Yes If no, delete pharmacy and type the correct one.   Has the prescription been filled recently? No  Is the patient out of the medication? Unsure, they are calling for a new prescription.   Has the patient been seen for an appointment in the last year OR does the patient have an upcoming appointment? Yes  Can we respond through MyChart? Yes  Agent: Please be advised that Rx refills may take up to 3 business days. We ask that you follow-up with your pharmacy.

## 2024-08-12 ENCOUNTER — Other Ambulatory Visit: Payer: Self-pay

## 2024-08-12 DIAGNOSIS — E220 Acromegaly and pituitary gigantism: Secondary | ICD-10-CM

## 2024-08-12 MED ORDER — CABERGOLINE 0.5 MG PO TABS: 0.5000 mg | ORAL_TABLET | ORAL | 3 refills | Status: DC

## 2024-08-14 ENCOUNTER — Other Ambulatory Visit: Payer: Self-pay | Admitting: Internal Medicine

## 2024-08-14 DIAGNOSIS — E220 Acromegaly and pituitary gigantism: Secondary | ICD-10-CM

## 2024-08-18 ENCOUNTER — Other Ambulatory Visit: Payer: Self-pay

## 2024-08-18 DIAGNOSIS — E220 Acromegaly and pituitary gigantism: Secondary | ICD-10-CM

## 2024-08-19 ENCOUNTER — Other Ambulatory Visit: Payer: Self-pay

## 2024-08-19 ENCOUNTER — Telehealth: Payer: Self-pay

## 2024-08-19 NOTE — Telephone Encounter (Signed)
 Patient called asking for Plavix  refill.  After much searching, it appears that patient's  Plavix  was discontinued when she was d/c'd from the hospital on 05/21/23.  See AVS for this IP Visit.  Patient notified.

## 2024-08-20 ENCOUNTER — Other Ambulatory Visit: Payer: Self-pay | Admitting: Internal Medicine

## 2024-08-26 ENCOUNTER — Ambulatory Visit: Admitting: Family Medicine

## 2024-08-26 ENCOUNTER — Telehealth: Payer: Self-pay

## 2024-08-26 ENCOUNTER — Encounter: Payer: Self-pay | Admitting: Family Medicine

## 2024-08-26 ENCOUNTER — Other Ambulatory Visit (HOSPITAL_COMMUNITY)
Admission: RE | Admit: 2024-08-26 | Discharge: 2024-08-26 | Disposition: A | Discharge status: Home / Self Care | Source: Ambulatory Visit | Attending: Family Medicine | Admitting: Family Medicine

## 2024-08-26 ENCOUNTER — Other Ambulatory Visit (HOSPITAL_COMMUNITY): Payer: Self-pay

## 2024-08-26 VITALS — BP 135/79 | HR 80 | Temp 97.0°F | Resp 18 | Wt 211.6 lb

## 2024-08-26 DIAGNOSIS — N1832 Chronic kidney disease, stage 3b: Secondary | ICD-10-CM | POA: Diagnosis not present

## 2024-08-26 DIAGNOSIS — D352 Benign neoplasm of pituitary gland: Secondary | ICD-10-CM | POA: Diagnosis not present

## 2024-08-26 DIAGNOSIS — N898 Other specified noninflammatory disorders of vagina: Secondary | ICD-10-CM

## 2024-08-26 DIAGNOSIS — I509 Heart failure, unspecified: Secondary | ICD-10-CM | POA: Diagnosis not present

## 2024-08-26 DIAGNOSIS — E1122 Type 2 diabetes mellitus with diabetic chronic kidney disease: Secondary | ICD-10-CM

## 2024-08-26 DIAGNOSIS — E039 Hypothyroidism, unspecified: Secondary | ICD-10-CM | POA: Diagnosis not present

## 2024-08-26 DIAGNOSIS — E559 Vitamin D deficiency, unspecified: Secondary | ICD-10-CM

## 2024-08-26 DIAGNOSIS — E220 Acromegaly and pituitary gigantism: Secondary | ICD-10-CM | POA: Diagnosis not present

## 2024-08-26 DIAGNOSIS — E038 Other specified hypothyroidism: Secondary | ICD-10-CM

## 2024-08-26 DIAGNOSIS — Z794 Long term (current) use of insulin: Secondary | ICD-10-CM | POA: Diagnosis not present

## 2024-08-26 DIAGNOSIS — E1142 Type 2 diabetes mellitus with diabetic polyneuropathy: Secondary | ICD-10-CM | POA: Diagnosis not present

## 2024-08-26 DIAGNOSIS — Z1211 Encounter for screening for malignant neoplasm of colon: Secondary | ICD-10-CM

## 2024-08-26 DIAGNOSIS — E66812 Obesity, class 2: Secondary | ICD-10-CM

## 2024-08-26 DIAGNOSIS — E782 Mixed hyperlipidemia: Secondary | ICD-10-CM

## 2024-08-26 DIAGNOSIS — H35043 Retinal micro-aneurysms, unspecified, bilateral: Secondary | ICD-10-CM | POA: Diagnosis not present

## 2024-08-26 DIAGNOSIS — I10 Essential (primary) hypertension: Secondary | ICD-10-CM

## 2024-08-26 DIAGNOSIS — I739 Peripheral vascular disease, unspecified: Secondary | ICD-10-CM

## 2024-08-26 DIAGNOSIS — K5904 Chronic idiopathic constipation: Secondary | ICD-10-CM

## 2024-08-26 DIAGNOSIS — Z780 Asymptomatic menopausal state: Secondary | ICD-10-CM

## 2024-08-26 LAB — LIPID PANEL
Cholesterol: 142 mg/dL (ref 0–200)
HDL: 64.1 mg/dL (ref >39.00)
LDL Cholesterol: 60 mg/dL (ref 0–99)
NonHDL: 77.99
Total CHOL/HDL Ratio: 2
Triglycerides: 91 mg/dL (ref 0.0–149.0)
VLDL: 18.2 mg/dL (ref 0.0–40.0)

## 2024-08-26 LAB — POCT GLYCOSYLATED HEMOGLOBIN (HGB A1C): Hemoglobin A1C: 8.1 % — AB (ref 4.0–5.6)

## 2024-08-26 LAB — COMPREHENSIVE METABOLIC PANEL WITH GFR
ALT: 10 U/L (ref 0–35)
AST: 15 U/L (ref 0–37)
Albumin: 3.7 g/dL (ref 3.5–5.2)
Alkaline Phosphatase: 150 U/L — ABNORMAL HIGH (ref 39–117)
BUN: 27 mg/dL — ABNORMAL HIGH (ref 6–23)
CO2: 29 meq/L (ref 19–32)
Calcium: 9.3 mg/dL (ref 8.4–10.5)
Chloride: 101 meq/L (ref 96–112)
Creatinine, Ser: 1.31 mg/dL — ABNORMAL HIGH (ref 0.40–1.20)
GFR: 42.3 mL/min — ABNORMAL LOW (ref >60.00)
Glucose, Bld: 172 mg/dL — ABNORMAL HIGH (ref 70–99)
Potassium: 4.1 meq/L (ref 3.5–5.1)
Sodium: 138 meq/L (ref 135–145)
Total Bilirubin: 0.4 mg/dL (ref 0.2–1.2)
Total Protein: 7.1 g/dL (ref 6.0–8.3)

## 2024-08-26 LAB — TSH: TSH: 0.01 u[IU]/mL — ABNORMAL LOW (ref 0.35–5.50)

## 2024-08-26 LAB — MICROALBUMIN / CREATININE URINE RATIO
Creatinine,U: 57.9 mg/dL
Microalb Creat Ratio: 21.1 mg/g (ref 0.0–30.0)
Microalb, Ur: 1.2 mg/dL (ref 0.0–1.9)

## 2024-08-26 LAB — CBC WITH DIFFERENTIAL/PLATELET
Basophils Absolute: 0 K/uL (ref 0.0–0.1)
Basophils Relative: 0.4 % (ref 0.0–3.0)
Eosinophils Absolute: 0.2 K/uL (ref 0.0–0.7)
Eosinophils Relative: 3.3 % (ref 0.0–5.0)
HCT: 32.8 % — ABNORMAL LOW (ref 36.0–46.0)
Hemoglobin: 10.7 g/dL — ABNORMAL LOW (ref 12.0–15.0)
Lymphocytes Relative: 28.1 % (ref 12.0–46.0)
Lymphs Abs: 1.4 K/uL (ref 0.7–4.0)
MCHC: 32.6 g/dL (ref 30.0–36.0)
MCV: 82.2 fl (ref 78.0–100.0)
Monocytes Absolute: 0.3 K/uL (ref 0.1–1.0)
Monocytes Relative: 6.1 % (ref 3.0–12.0)
Neutro Abs: 3.1 K/uL (ref 1.4–7.7)
Neutrophils Relative %: 62.1 % (ref 43.0–77.0)
Platelets: 222 K/uL (ref 150.0–400.0)
RBC: 3.99 Mil/uL (ref 3.87–5.11)
RDW: 15.2 % (ref 11.5–15.5)
WBC: 4.9 K/uL (ref 4.0–10.5)

## 2024-08-26 LAB — BRAIN NATRIURETIC PEPTIDE: Pro B Natriuretic peptide (BNP): 91 pg/mL (ref 0.0–100.0)

## 2024-08-26 LAB — B12 AND FOLATE PANEL
Folate: 13.9 ng/mL (ref >5.9)
Vitamin B-12: 346 pg/mL (ref 211–911)

## 2024-08-26 LAB — HIGH SENSITIVITY CRP: CRP, High Sensitivity: 1.6 mg/L (ref 0.000–5.000)

## 2024-08-26 LAB — T4, FREE: Free T4: 1.19 ng/dL (ref 0.60–1.60)

## 2024-08-26 LAB — PHOSPHORUS: Phosphorus: 5.4 mg/dL — ABNORMAL HIGH (ref 2.3–4.6)

## 2024-08-26 MED ORDER — DULOXETINE HCL 30 MG PO CPEP: ORAL_CAPSULE | ORAL | 3 refills | Status: DC

## 2024-08-26 MED ORDER — LINACLOTIDE 72 MCG PO CAPS: 72.0000 ug | ORAL_CAPSULE | Freq: Every day | ORAL | 0 refills | Status: AC

## 2024-08-26 MED ORDER — TIRZEPATIDE 2.5 MG/0.5ML ~~LOC~~ SOAJ: 2.5000 mg | SUBCUTANEOUS | 0 refills | Status: DC

## 2024-08-26 NOTE — Telephone Encounter (Signed)
 Pharmacy Patient Advocate Encounter   Received notification from CoverMyMeds that prior authorization for Mounjaro 2.5MG /0.5ML auto-injectors is required/requested.   Insurance verification completed.   The patient is insured through [facility] ADVANTAGE/RX ADVANCE.   Per test claim: PA required; PA submitted to above mentioned insurance via Latent Key/confirmation #/EOC [id number] Status is pending

## 2024-08-26 NOTE — Patient Instructions (Addendum)
  VISIT SUMMARY: Today, we discussed your diabetes management, heart health, and other chronic conditions. We made some changes to your medications and ordered several tests to monitor your health.  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 -Start Mounjaro 2.5 mg injection once a week for diabetes management. -Continue Novolog  0-6 units subcutaneously three times a day before meals. -Consider reintroducing long-acting insulin  if Mounjaro is not sufficient. -We will check your serum creatinine, eGFR, urine albumin to creatinine ratio, and fasting glucose. -Refer to an endocrinologist for diabetes management. -Start duloxetine 30 mg daily for one week, then increase to 60 mg daily for nerve pain in your feet.  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 -Order IGF-1 level. -Order MRI of the pituitary to assess tumor size. -Continue cabergoline  0.125 mg by mouth twice a week. -Continue octreotide  100 mcg injections three times a day. -Refer to an endocrinologist for follow-up.  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 -Order TSH with reflex to T4. -Order prolactin, ACTH , AM cortisol, FSH, LH, testosterone, and estradiol. -Continue levothyroxine  88 mcg daily.  HEART FAILURE: Your heart failure needs to be monitored. -Order BNP test.  PERIPHERAL ARTERIAL DISEASE: You have poor blood flow in your legs. -Continue clopidogrel  75 mg daily. -Continue aspirin  81 mg daily.  OBESITY, CLASS 2: You have obesity with severe health issues. -Start Mounjaro 2.5 mg injection once a week for weight management.  MIXED HYPERLIPIDEMIA: You have high cholesterol  levels. -Continue simvastatin  20 mg daily.  VITAMIN D  DEFICIENCY: You have low levels of vitamin D . -Continue vitamin D  2000 IU daily. -Order vitamin D  level. -Order calcium and phosphorus levels. -Order alkaline phosphatase.  HYPERTENSION: Your blood pressure is well-controlled. -Continue losartan  100 mg daily.  DIABETIC RETINOPATHY WITH RETINAL MICROANEURYSMS, BILATERAL: You have damage to the blood vessels in your eyes due to diabetes.  CHRONIC CONSTIPATION: You have long-standing constipation issues. -Start Linzess 72 mcg once daily before breakfast.  RECURRENT VAGINAL CANDIDIASIS: You have frequent yeast infections likely due to uncontrolled diabetes. -Perform vaginal swab to check for bacterial vaginosis. -Await lab results before starting treatment.  GENERAL HEALTH MAINTENANCE: You are due for routine health screenings. -Order bone DEXA scan to screen for osteoporosis. -Refer for colonoscopy. -Ensure care management team coordinates appointments.

## 2024-08-26 NOTE — Telephone Encounter (Signed)
 Contacted patient to inform her that her Monjaro has been approved. She can contact her pharmacy to pick it up. Patient expressed understanding nothing further needed

## 2024-08-26 NOTE — Telephone Encounter (Signed)
 Pharmacy Patient Advocate Encounter  Received notification from [facility] ADVANTAGE/RX ADVANCE that Prior Authorization for  Mounjaro 2.5MG /0.5ML auto-injectors  has been APPROVED from 08/26/24 to 08/26/25   PA #/Case ID/Reference #: [id number]

## 2024-08-26 NOTE — Progress Notes (Signed)
 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 Chronic kidney disease is at stage 3b. Diabetic polyneuropathy contributes to neuropathic pain in the feet. Mounjaro is recommended for diabetes management due to its potential benefits for kidney protection, weight loss, sleep apnea, and heart protection. Insurance coverage for Basaglar  is uncertain, and alternatives like Mounjaro and [place] are considered. 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 MRI is needed to assess tumor size due to previous discontinuation of octreotide . 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 TSH alone is not reliable for pituitary disease. - Order TSH with reflex to T4 - Order prolactin, ACTH , AM cortisol, FSH, LH, testosterone, and estradiol - Continue levothyroxine  88 mcg daily  Heart failure Heart failure status to be monitored. - Order  BNP  Peripheral arterial disease Peripheral arterial disease is being managed with antiplatelet therapy. - Continue clopidogrel  75 mg daily - Continue aspirin  81 mg daily  Obesity, class 2 Class 2 obesity with severe comorbidities. Mounjaro is considered for weight management due to its potential benefits for weight loss and comorbid conditions. - Start Mounjaro 2.5 mg injection once a week for weight management  Mixed hyperlipidemia Mixed hyperlipidemia is being managed with statin therapy. - Continue simvastatin  20 mg daily  Vitamin D  deficiency Vitamin D  deficiency is being managed with supplementation. - Continue vitamin D  2000 IU daily - Order vitamin D  level - Order calcium and phosphorus levels - Order alkaline phosphatase  Hypertension Hypertension is well-controlled with current medication regimen. Blood pressure is close to goal at 135/79 mmHg. - Continue losartan  100 mg daily  Diabetic retinopathy with retinal microaneurysms, bilateral Diabetic retinopathy with bilateral retinal microaneurysms.  Chronic constipation Chronic constipation, possibly idiopathic, with a long-standing history. - Start Linzess 72 mcg once daily before breakfast  Recurrent vaginal candidiasis Recurrent vaginal candidiasis likely due to uncontrolled diabetes. Vaginal itching and discharge present. - Perform vaginal swab to check for bacterial vaginosis - Await lab results before initiating treatment  General Health Maintenance Due for several routine health maintenance screenings. - Order bone DEXA scan to screen for osteoporosis - Refer for colonoscopy - Ensure care management team coordinates appointments  Peripheral Neuropathy Peripheral neuropathy with burning sensation in the feet, likely due to diabetes. - Start duloxetine 30 mg daily for one week, then increase to 60 mg daily      There are no discontinued medications.  Return in about 1 month (around 09/26/2024).         Subjective:   Encounter date: 08/26/2024  Danielle  KANDICE Schroeder is a 68 y.o. female who has Goiter; Acquired hypothyroidism; Pituitary adenoma with extrasellar extension (HCC); Acromegaly (HCC); CHF (congestive heart failure) (HCC); Stable treated proliferative diabetic retinopathy of right eye determined by examination associated with type 2 diabetes mellitus (HCC); Proliferative diabetic retinopathy of left eye with macular edema associated with type 2 diabetes mellitus (HCC); Retinal hemorrhage of left eye; Retinal microaneurysm of both eyes; Retinal hemorrhage of right eye; Vitamin D  deficiency; Vitreomacular adhesion, left; Type 2 diabetes mellitus with stage 3b chronic kidney disease, with long-term current use of insulin  (HCC); DM2 (diabetes mellitus, type 2) (HCC); Pseudophakia, both eyes; Cataract; CKD stage 3b, GFR 30-44 ml/min (HCC); Diabetic neuropathy (HCC); Diabetic retinopathy associated with type 2 diabetes mellitus (HCC); Essential hypertension; HLD (hyperlipidemia); Obesity; Pituitary mass; Tubular adenoma of colon; 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   She  has a past medical history of Cataract, CHF (congestive heart failure) (HCC), Diabetes mellitus without complication (HCC), Hyperlipidemia, Hypertension, Pituitary adenoma with extrasellar extension (HCC), Sleep apnea, and Thyroid  disease..   She presents with chief complaint of Hypertension, Diabetes (Pt is not fasting today//HM due- colonoscopy,  dexa scan and vaccinations ( Pt declined due to transportation ) ), and Vaginitis (Pt c/o of yeast infection for 2- 3 weeks ) .   Discussed the use of AI scribe software for clinical note transcription with the patient, who gave verbal consent to proceed.  History of Present Illness Mikayah CHARLII YOST is a 68 year old female with multiple chronic conditions who presents for follow-up.  Glycemic control and diabetes-related complications - Type 2 diabetes with chronic kidney disease stage 3B - Hemoglobin A1c elevated at 8.1% - Not taking long-acting insulin  for approximately two months due to insurance issues with Basaglar  coverage - Current insulin  regimen: 54 units daily, Novolog  0-6 units TID before meals - No polyuria or polydipsia - Peripheral neuropathy in feet, interested in starting medication for neuropathic symptoms - Recurrent vaginal yeast infection, attributed to diabetes  Cardiovascular and peripheral vascular disease - Congestive heart failure - Peripheral artery disease - No chest pain or shortness of breath - Current medications: losartan  100 mg daily, clopidogrel  75 mg daily, aspirin  81 mg daily, simvastatin  20 mg daily  Pituitary and endocrine disorders - Acromegaly, currently on cabergoline  0.125 mg twice a week and octreotide  100 mcg TID injections - Central hypothyroidism, on levothyroxine  88 mcg daily - Obesity class 2 - Hyperlipidemia  Renal disease - Chronic kidney disease stage 3B - Has not seen nephrologist since last year  Retinal disease - History of retinal disease  Recurrent infections and dermatologic issues - Recurrent intertrigo - Recurrent vaginal yeast infection  Gastrointestinal symptoms - Constipation - History of chronic bowel issues  Vitamin d  deficiency - Vitamin D  deficiency, on vitamin D  2000 IU daily     ROS  Past Surgical History:  Procedure Laterality Date   ABDOMINAL AORTOGRAM W/LOWER EXTREMITY Left 04/11/2023    Procedure: ABDOMINAL AORTOGRAM W/LOWER EXTREMITY;  Surgeon: Lanis Fonda BRAVO, MD;  [facility] INVAS[facility] CV LAB;  Service: Cardiovascular;  Laterality: Left;   ABDOMINAL AORTOGRAM W/LOWER EXTREMITY N/A 05/16/2023   Procedure: ABDOMINAL  AORTOGRAM W/LOWER EXTREMITY;  Surgeon: Lanis Fonda BRAVO, MD;  Location: [facility] INVASIVE CV LAB;  Service: Cardiovascular;  Laterality: N/A;   AMPUTATION Right 05/18/2023   Procedure: RIGHT GREAT TOE AMPUTATION;  Surgeon: Sheree Penne Bruckner, MD;  Location: [facility] OR;  Service: Vascular;  Laterality: Right;   BREAST EXCISIONAL BIOPSY Left    benign   BREAST SURGERY     CHOLECYSTECTOMY     PERIPHERAL VASCULAR BALLOON ANGIOPLASTY Left 04/11/2023   Procedure: PERIPHERAL VASCULAR BALLOON ANGIOPLASTY;  Surgeon: Lanis Fonda BRAVO, MD;  Location: [facility] INVASIVE CV LAB;  Service: Cardiovascular;  Laterality: Left;  Lt AT   PERIPHERAL VASCULAR INTERVENTION Left 04/11/2023   Procedure: PERIPHERAL VASCULAR INTERVENTION;  Surgeon: Lanis Fonda BRAVO, MD;  Location[facility] INVAS[facility] CV LAB;  Service: Cardiovascular;  Laterality: Left;  Lt SFA   PERIPHERAL VASCULAR INTERVENTION  05/16/2023   Procedure: PERIPHERAL VASCULAR INTERVENTION;  Surgeon: Lanis Fonda BRAVO, MD;  Location: [facility] INVASIVE CV LAB;  Service: Cardiovascular;;   TONSILLECTOMY     TUBAL LIGATION      Outpatient Medications Prior to Visit  Medication Sig Dispense Refill   aspirin  EC 81 MG tablet Take 81 mg by mouth in the morning.     Blood Glucose Monitoring Suppl (ONETOUCH VERIO) w/Device KIT 1 Device by Does not apply route daily in the afternoon. 1 kit 0   cabergoline  (DOSTINEX ) 0.5 MG tablet TAKE 1/2 TABLET (0.25 MG TOTAL) BY MOUTH TWICE A WEEK 24 tablet 3   carvedilol  (COREG ) 25 MG tablet Take 1 tablet (25 mg total) by mouth 2 (two) times daily with a meal. 90 tablet 3   clopidogrel  (PLAVIX ) 75 MG tablet Take 75 mg by mouth daily.     clotrimazole  (LOTRIMIN ) 1 % cream APPLY TO AFFECTED AREA TWICE A DAY 60 g 0    furosemide  (LASIX ) 40 MG tablet Take 1 tablet (40 mg total) by mouth 2 (two) times daily. 180 tablet 3   glucose blood test strip 1 each by Other route 3 (three) times daily. Use as instructed 300 each 12   hydrocortisone  2.5 % ointment APPLY TOPICALLY 2 (TWO) TIMES DAILY. AS NEEDED FOR MILD ECZEMA. DO NOT USE FOR MORE THAN 1-2 WEEKS AT A TIME. 20 g 3   Insulin  Glargine (BASAGLAR  KWIKPEN) 100 UNIT/ML Inject 54 Units into the skin daily. 60 mL 3   Insulin  Pen Needle (PEN NEEDLES) 31G X 5 MM MISC 1 each by Does not apply route in the morning, at noon, in the evening, and at bedtime. 400 each 3   levothyroxine  (SYNTHROID ) 88 MCG tablet Take 1 tablet (88 mcg total) by mouth daily. 90 tablet 3   losartan  (COZAAR ) 100 MG tablet Take 1 tablet (100 mg total) by mouth daily. 90 tablet 3   NOVOLOG  FLEXPEN 100 UNIT/ML FlexPen INJECT 0-6 UNITS SUBCUTANEOUSLY THREE TIMES DAILY BEFORE MEALS MAX DAILY 45 UNITS 45 mL 1   octreotide  (SANDOSTATIN ) 100 MCG/ML SOLN injection Inject 1 mL (100 mcg total) into the skin 3 (three) times daily. 270 mL 3   potassium chloride  (KLOR-CON ) 10 MEQ tablet TAKE 1 TABLET BY MOUTH EVERY DAY 90 tablet 3   simvastatin  (ZOCOR ) 20 MG tablet TAKE ONE TABLET (20 MG TOTAL) BY MOUTH DAILY AT 5PM EVERY EVENING 90 tablet 3   SYRINGE-NEEDLE, DISP, 3 ML 25G X 1 3 ML MISC Inject 1 ml into the skin 3 times daily. 100 each 11   Vitamin D , Cholecalciferol , 25 MCG (1000 UT) CAPS Take 2 capsules  by mouth daily. 60 capsule 0   acetaminophen  (TYLENOL ) 325 MG tablet Take 2 tablets (650 mg total) by mouth every 4 (four) hours as needed for headache or mild pain. (Patient not taking: Reported on 08/26/2024) 30 tablet 0   amLODipine  (NORVASC ) 5 MG tablet Take 1 tablet (5 mg total) by mouth daily. (Patient not taking: Reported on 08/26/2024) 90 tablet 0   fluconazole  (DIFLUCAN ) 150 MG tablet Take 1 tablet (150 mg total) by mouth every three (3) days as needed. (Patient not taking: Reported on 08/26/2024) 2  tablet 0   No facility-administered medications prior to visit.    Family History  Problem Relation Age of Onset   Thyroid  disease Neg Hx    Colon cancer Neg Hx    Esophageal cancer Neg Hx    Rectal cancer Neg Hx    Stomach cancer Neg Hx    BRCA 1/2 Neg Hx    Breast cancer Neg Hx     Social History   Socioeconomic History   Marital status: Single    Spouse name: Not on file   Number of children: Not on file   Years of education: Not on file   Highest education level: Not on file  Occupational History   Not on file  Tobacco Use   Smoking status: Never   Smokeless tobacco: Never  Vaping Use   Vaping status: Never Used  Substance and Sexual Activity   Alcohol use: Never   Drug use: Never   Sexual activity: Not on file  Other Topics Concern   Not on file  Social History Narrative   Not on file   Social Drivers of Health   Financial Resource Strain: Low Risk  (04/25/2024)   Overall Financial Resource Strain (CARDIA)    Difficulty of Paying Living Expenses: Not hard at all  Food Insecurity: No Food Insecurity (04/25/2024)   Hunger Vital Sign    Worried About Running Out of Food in the Last Year: Never true    Ran Out of Food in the Last Year: Never true  Transportation Needs: No Transportation Needs (04/25/2024)   PRAPARE - Administrator, Civil Service (Medical): No    Lack of Transportation (Non-Medical): No  Physical Activity: Inactive (04/25/2024)   Exercise Vital Sign    Days of Exercise per Week: 0 days    Minutes of Exercise per Session: 0 min  Stress: No Stress Concern Present (04/25/2024)   [employer] of Occupational Health - Occupational Stress Questionnaire    Feeling of Stress: Not at all  Social Connections: Moderately Isolated (04/25/2024)   Social Connection and Isolation Panel    Frequency of Communication with Friends and Family: Twice a week    Frequency of Social Gatherings with Friends and Family: Twice a week    Attends  Religious Services: 1 to 4 times per year    Active Member of [employer] or Organizations: No    Attends Banker Meetings: Never    Marital Status: Divorced  Catering manager Violence: Not At Risk (04/25/2024)   Humiliation, Afraid, Rape, and Kick questionnaire    Fear of Current or Ex-Partner: No    Emotionally Abused: No    Physically Abused: No    Sexually Abused: No  Objective:  Physical Exam: BP 135/79 (BP Location: Left Arm, Patient Position: Sitting, Cuff Size: Large)   Pulse 80   Temp (!) 97 F (36.1 C) (Temporal)   Resp 18   Wt 211 lb 9.6 oz (96 kg)   SpO2 100%   BMI 33.14 kg/m   Wt Readings from Last 3 Encounters:  08/26/24 211 lb 9.6 oz (96 kg)  02/26/24 224 lb 3.2 oz (101.7 kg)  02/21/24 222 lb 12.8 oz (101.1 kg)   .last Physical Exam GENERAL: Alert, cooperative, well developed, no acute distress HEENT: Normocephalic, normal oropharynx, moist mucous membranes CHEST: Clear to auscultation bilaterally, No wheezes, rhonchi, or crackles CARDIOVASCULAR: Normal heart rate and rhythm, S1 and S2 normal without murmurs ABDOMEN: Soft, non-tender, non-distended, without organomegaly, Normal bowel sounds EXTREMITIES: No cyanosis or edema NEUROLOGICAL: Cranial nerves grossly intact, Moves all extremities without gross motor or sensory deficit   Physical Exam  US  THYROID  Result Date: 06/14/2024 CLINICAL DATA:  Prior ultrasound follow-up. EXAM: THYROID  ULTRASOUND TECHNIQUE: Ultrasound examination of the thyroid  gland and adjacent soft tissues was performed. COMPARISON:  01/22/2023 FINDINGS: Parenchymal Echotexture: Moderately heterogeneous Isthmus: 0.6 cm ,previously 0.5 cm Right lobe: 4.5 x 2.0 x 2.1 cm ,previously 5.7 x 2.6 x 2.5 cm Left lobe: 5.2 x 2.2 x 2.5 cm ,previously 4.3 x 2.7 x 2.0 cm ________________________________________________________ Estimated total number  of nodules >/= 1 cm: 3 Number of spongiform nodules >/=  2 cm not described below (TR1): 0 Number of mixed cystic and solid nodules >/= 1.5 cm not described below (TR2): 0 _________________________________________________________ Similar benign appearing solid cystic nodule in the right superior thyroid  (labeled 1, 1.0 cm, previously 24.0 cm). Interval development of benign appearing cystic nodule in the right mid thyroid  (labeled 2, 0.6 cm) which does not require additional follow-up (TI-RADS category 1). Similar benign appearing solid nodule in the right inferior thyroid  (labeled 3, 0.9 cm, previously labeled 2, 0.8 cm). Similar benign appearing solid nodule in the right inferior thyroid  (labeled 4, 1.6 cm, previously with 3, 1.9 cm). Nodule # 5: Prior biopsy: No Location: Left; Mid Maximum size: 1.6 cm; Other 2 dimensions: 1.4 x 0.9 cm, previously, 1.9 x 1.5 x 0.9 cm Composition: solid/almost completely solid (2) Echogenicity: isoechoic (1) Shape: not taller-than-wide (0) Margins: smooth (0) Echogenic foci: none (0) ACR TI-RADS total points: 3. ACR TI-RADS risk category:  TR3 (3 points). Significant change in size (>/= 20% in two dimensions and minimal increase of 2 mm): No Change in features: No Change in ACR TI-RADS risk category: No ACR TI-RADS recommendations: This nodule now demonstrates 5 year stability and is therefore declared benign. _________________________________________________________ No cervical lymphadenopathy. IMPRESSION: 1. Similar appearing multinodular thyroid . 2. Solid nodule in the left mid thyroid  (labeled 5, 1.6 cm, previously labeled 4, 1.9 cm) demonstrates 5 years of stability and is therefore declared benign. No additional follow-up is warranted. The above is in keeping with the ACR TI-RADS recommendations - J Am Coll Radiol 2017;14:587-595. Ester Sides, MD [facility] Electronically Signed   By: Ester Sides M.D.   On:  06/14/2024 16:19    Recent Results (from the past 2160 hours)  POCT glycosylated hemoglobin (Hb A1C)     Status: Abnormal   Collection Time: 08/26/24  9:00 AM  Result Value Ref Range   Hemoglobin A1C 8.1 (A) 4.0 - 5.6 %   HbA1c POC (<> result, manual entry)     HbA1c, POC (prediabetic range)     HbA1c,  POC (controlled diabetic range)          Beverley Adine Hummer, MD, MS

## 2024-08-27 ENCOUNTER — Other Ambulatory Visit (HOSPITAL_COMMUNITY): Payer: Self-pay

## 2024-08-27 ENCOUNTER — Telehealth: Payer: Self-pay

## 2024-08-27 ENCOUNTER — Ambulatory Visit: Payer: Self-pay | Admitting: Family Medicine

## 2024-08-27 DIAGNOSIS — B3731 Acute candidiasis of vulva and vagina: Secondary | ICD-10-CM

## 2024-08-27 LAB — CERVICOVAGINAL ANCILLARY ONLY
Bacterial Vaginitis (gardnerella): NEGATIVE
Candida Glabrata: POSITIVE — AB
Candida Vaginitis: POSITIVE — AB
Chlamydia: NEGATIVE
Comment: NEGATIVE
Comment: NEGATIVE
Comment: NEGATIVE
Comment: NEGATIVE
Comment: NEGATIVE
Comment: NORMAL
Neisseria Gonorrhea: NEGATIVE
Trichomonas: NEGATIVE

## 2024-08-27 LAB — FSH/LH
FSH: <0.7 m[IU]/mL — ABNORMAL LOW
LH: <0.2 m[IU]/mL — ABNORMAL LOW

## 2024-08-27 MED ORDER — FLUCONAZOLE 150 MG PO TABS: 150.0000 mg | ORAL_TABLET | ORAL | 0 refills | Status: AC | PRN

## 2024-08-27 NOTE — Progress Notes (Signed)
 Complex Care Management Note Care Guide Note  08/27/2024 Name: Danielle Schroeder MRN: [id number] DOB: 1956-03-04   Complex Care Management Outreach Attempts: An unsuccessful telephone outreach was attempted today to offer the patient information about available complex care management services.  Follow Up Plan:  Additional outreach attempts will be made to offer the patient complex care management information and services.   Encounter Outcome:  No Answer  Dreama [facility]  [facility], [facility] VBCI Assistant Direct Dial: [phone]  Fax: [phone]

## 2024-08-29 NOTE — Progress Notes (Unsigned)
 Complex Care Management Note Care Guide Note  08/29/2024 Name: Danielle Schroeder MRN: [id number] DOB: Dec 14, 1955   Complex Care Management Outreach Attempts: A second unsuccessful outreach was attempted today to offer the patient with information about available complex care management services.  Follow Up Plan:  Additional outreach attempts will be made to offer the patient complex care management information and services.   Encounter Outcome:  No Answer  Dreama [facility]  [facility], [facility] VBCI Assistant Direct Dial: [phone]  Fax: [phone]

## 2024-08-29 NOTE — Addendum Note (Signed)
 Addended by: EUGENIE ULANDA CROME on: 08/29/2024 11:20 AM   Modules accepted: Orders

## 2024-09-01 LAB — ESTRADIOL: Estradiol: <30 pg/mL

## 2024-09-01 LAB — IRON,TIBC AND FERRITIN PANEL
%SAT: 20 % (ref 16–45)
Ferritin: 52 ng/mL (ref 16–288)
Iron: 73 ug/dL (ref 45–160)
TIBC: 365 ug/dL (ref 250–450)

## 2024-09-01 LAB — URINE CULTURE
MICRO NUMBER:: 17099334
Result:: NO GROWTH
SPECIMEN QUALITY:: ADEQUATE

## 2024-09-01 LAB — INSULIN-LIKE GROWTH FACTOR
IGF-I, LC/MS: 752 ng/mL — ABNORMAL HIGH (ref 41–279)
Z-Score (Female): 4.7 {STDV} — ABNORMAL HIGH (ref ?–2.0)

## 2024-09-01 LAB — VITAMIN D 1,25 DIHYDROXY
Vitamin D 1, 25 (OH)2 Total: 52 pg/mL (ref 18–72)
Vitamin D2 1, 25 (OH)2: <8 pg/mL
Vitamin D3 1, 25 (OH)2: 52 pg/mL

## 2024-09-01 LAB — URINALYSIS W MICROSCOPIC + REFLEX CULTURE
Bilirubin Urine: NEGATIVE
Glucose, UA: NEGATIVE
Hgb urine dipstick: NEGATIVE
Hyaline Cast: NONE SEEN /LPF
Ketones, ur: NEGATIVE
Nitrites, Initial: NEGATIVE
Protein, ur: NEGATIVE
RBC / HPF: NONE SEEN /HPF (ref 0–2)
Specific Gravity, Urine: 1.011 (ref 1.001–1.035)
pH: 6 (ref 5.0–8.0)

## 2024-09-01 LAB — ACTH: C206 ACTH: 29 pg/mL (ref 6–50)

## 2024-09-01 LAB — PROLACTIN: Prolactin: 1.4 ng/mL — ABNORMAL LOW

## 2024-09-01 LAB — INSULIN, RANDOM: Insulin: 45.9 u[IU]/mL — ABNORMAL HIGH

## 2024-09-01 LAB — CULTURE INDICATED

## 2024-09-01 LAB — PARATHYROID HORMONE, INTACT (NO CA): PTH: 118 pg/mL — ABNORMAL HIGH (ref 16–77)

## 2024-09-01 NOTE — Progress Notes (Unsigned)
 Complex Care Management Note  Care Guide Note 09/01/2024 Name: Danielle Schroeder MRN: [id number] DOB: May 19, 1956  [name] is a 68 y.o. year old female who sees Sebastian Beverley NOVAK, MD for primary care. I reached out to [employer] by phone today to offer complex care management services.  [name] was given information about Complex Care Management services today including:   The Complex Care Management services include support from the care team which includes your Nurse Care Manager, Clinical Social Worker, or Pharmacist.  The Complex Care Management team is here to help remove barriers to the health concerns and goals most important to you. Complex Care Management services are voluntary, and the patient may decline or stop services at any time by request to their care team member.   Complex Care Management Consent Status: Patient wishes to consider information provided and/or speak with a member of the care team before deciding to participate in complex care management services.   Follow up plan:  Unsuccessful telephone outreach attempt made. A HIPAA compliant phone message was left for the patient providing contact information and requesting a return call.  Encounter Outcome:  Patient Request to Call Back  Dreama [facility]  [facility], [facility] VBCI Assistant Direct Dial: [phone]  Fax: [phone]

## 2024-09-01 NOTE — Addendum Note (Signed)
 Addended by: EUGENIE ULANDA CROME on: 09/01/2024 08:57 AM   Modules accepted: Orders

## 2024-09-02 NOTE — Progress Notes (Unsigned)
 Name: Danielle Schroeder  MRN/ DOB: [id number], May 24, 1956    Age/ Sex: 68 y.o., female     PCP: Sebastian Beverley NOVAK, MD   Reason for Endocrinology Evaluation: Pituitary adenoma      Initial Endocrinology Clinic Visit: 10/06/2019    PATIENT IDENTIFIER: Danielle Schroeder is a 68 y.o., female with a past medical history of T2DM, dyslipidemia, pituitary adenoma, acromegaly, central hypothyroidism. She has followed with [facility] since 10/06/2019 for consultative assistance with management of her pituitary adenoma.     HISTORICAL SUMMARY:  Per patient she believes she had pituitary adenoma for >40 yrs. she does endorse history of subcutaneous injections in the past which sound like octreotide .  She was seen by Dr. Rockney in 2020 for evaluation of pituitary adenoma. 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 She was deemed not a surgical candidate due to high risk for complication She is S/P radiation treatment in 2021 , not a candidate for surgical intervention    DM HISTORY : She was diagnosed with DM since the age of 49 units. She has been on insulin  for years. Her A1c ranges from 8.2 % in 2023 to 8.8% in 2022   THYROID  HISTORY: She has been diagnosed with thyroid  disease in 2010 and has been on LT-for replacement She was also diagnosed with multinodular goiter in December 2020 based on thyroid  ultrasound, no history of FNA   No FH of thyroid  disease  Has Fh of DM  Denies FH of pituitary disease   SUBJECTIVE:   Today (09/03/2024):  Danielle Schroeder is here for Acromegaly , hypothyroidism and T2DM.  She checks glucose once daily.  Patient is scheduled for brain MRI on 09/12/2024  Patient follows with podiatry with the last visit in August, 2025  No headaches  No visions changes  No nausea  Constipation is improving   No local neck No palpitations    She follows with Nephrology Dr. 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, CKD Denies:  Last Eye Exam: Completed 05/13/2024  Macrovascular complications:  PVD Denies: CAD, CVA    HISTORY:  Past Medical History:  Past Medical History:  Diagnosis Date   Cataract    bilateral lens implants   CHF (congestive heart failure) (HCC)    Diabetes mellitus without complication (HCC)    Hyperlipidemia    Hypertension    Pituitary adenoma with extrasellar extension (HCC)    Sleep apnea    no c-pap   Thyroid  disease    hypothyroidism   Past Surgical History:  Past Surgical History:  Procedure Laterality Date   ABDOMINAL AORTOGRAM W/LOWER EXTREMITY Left 04/11/2023   Procedure: ABDOMINAL AORTOGRAM W/LOWER EXTREMITY;  Surgeon: Lanis Fonda BRAVO, MD;  Location: [facility] INVASIVE CV  LAB;  Service: Cardiovascular;  Laterality: Left;   ABDOMINAL AORTOGRAM W/LOWER EXTREMITY N/A 05/16/2023   Procedure: ABDOMINAL AORTOGRAM W/LOWER EXTREMITY;  Surgeon: Lanis Fonda BRAVO, MD;  Location: [facility] INVASIVE CV LAB;  Service: Cardiovascular;  Laterality: N/A;   AMPUTATION Right 05/18/2023   Procedure: RIGHT GREAT TOE AMPUTATION;  Surgeon: Sheree Penne Bruckner, MD;  Location: [facility] OR;  Service: Vascular;  Laterality: Right;   BREAST EXCISIONAL BIOPSY Left    benign   BREAST SURGERY     CHOLECYSTECTOMY      PERIPHERAL VASCULAR BALLOON ANGIOPLASTY Left 04/11/2023   Procedure: PERIPHERAL VASCULAR BALLOON ANGIOPLASTY;  Surgeon: Lanis Fonda BRAVO, MD;  Location: [facility] INVASIVE CV LAB;  Service: Cardiovascular;  Laterality: Left;  Lt AT   PERIPHERAL VASCULAR INTERVENTION Left 04/11/2023   Procedure: PERIPHERAL VASCULAR INTERVENTION;  Surgeon: Lanis Fonda BRAVO, MD;  Location: [facility] INVASIVE CV LAB;  Service: Cardiovascular;  Laterality: Left;  Lt SFA   PERIPHERAL VASCULAR INTERVENTION  05/16/2023   Procedure: PERIPHERAL VASCULAR INTERVENTION;  Surgeon: Lanis Fonda BRAVO, MD;  Location: [facility] INVASIVE CV LAB;  Service: Cardiovascular;;   TONSILLECTOMY     TUBAL LIGATION     Social History:  reports that she has never smoked. She has never used smokeless tobacco. She reports that she does not drink alcohol and does not use drugs. Family History:  Family History  Problem Relation Age of Onset   Thyroid  disease Neg Hx    Colon cancer Neg Hx    Esophageal cancer Neg Hx    Rectal cancer Neg Hx    Stomach cancer Neg Hx    BRCA 1/2 Neg Hx    Breast cancer Neg Hx      HOME MEDICATIONS: Allergies as of 09/03/2024   No Known Allergies      Medication List        Accurate as of September 03, 2024  3:51 PM. If you have any questions, ask your nurse or doctor.          STOP taking these medications    Basaglar  KwikPen 100 UNIT/ML Stopped by: Donell PARAS Makinze Jani   octreotide  100 MCG/ML Soln injection Commonly known as: SANDOSTATIN  Replaced by: Mycapssa  20 MG Cpdr Stopped by: Kathlynn Swofford J Mateja Dier   tirzepatide 2.5 MG/0.5ML Pen Commonly known as: MOUNJARO Stopped by: Sheva Mcdougle J Tobey Lippard       TAKE these medications    acetaminophen  325 MG tablet Commonly known as: TYLENOL  Take 2 tablets (650 mg total) by mouth every 4 (four) hours as needed for headache or mild pain.   amLODipine  5 MG tablet Commonly known as: NORVASC  Take 1 tablet (5 mg total) by mouth daily.   aspirin  EC 81 MG  tablet Take 81 mg by mouth in the morning.   cabergoline  0.5 MG tablet Commonly known as: DOSTINEX  TAKE 1/2 TABLET (0.25 MG TOTAL) BY MOUTH TWICE A WEEK   carvedilol  25 MG tablet Commonly known as: COREG  Take 1 tablet (25 mg total) by mouth 2 (two) times daily with a meal.   clopidogrel  75 MG tablet Commonly known as: PLAVIX  Take 75 mg by mouth daily.   clotrimazole  1 % cream Commonly known as: LOTRIMIN  APPLY TO AFFECTED AREA TWICE A DAY   DULoxetine 30 MG capsule Commonly known as: Cymbalta Take 1 capsule by mouth daily for 7 day, THEN take 2 capsules daily.   fluconazole  150 MG tablet Commonly known as: DIFLUCAN  Take 1 tablet (150 mg total) by mouth every three (3) days as needed.  furosemide  40 MG tablet Commonly known as: LASIX  Take 1 tablet (40 mg total) by mouth 2 (two) times daily.   glucose blood test strip 1 each by Other route 3 (three) times daily. Use as instructed   hydrocortisone  2.5 % ointment APPLY TOPICALLY 2 (TWO) TIMES DAILY. AS NEEDED FOR MILD ECZEMA. DO NOT USE FOR MORE THAN 1-2 WEEKS AT A TIME.   levothyroxine  88 MCG tablet Commonly known as: SYNTHROID  Take 1 tablet (88 mcg total) by mouth daily.   linaclotide 72 MCG capsule Commonly known as: LINZESS Take 1 capsule (72 mcg total) by mouth daily before breakfast.   losartan  100 MG tablet Commonly known as: COZAAR  Take 1 tablet (100 mg total) by mouth daily.   Mycapssa  20 MG Cpdr Generic drug: Octreotide  Acetate Take 40 mg by mouth in the morning and at bedtime. Replaces: octreotide  100 MCG/ML Soln injection Started by: Donell PARAS Lennie Dunnigan   NovoLOG  FlexPen 100 UNIT/ML FlexPen Generic drug: insulin  aspart INJECT 0-6 UNITS SUBCUTANEOUSLY THREE TIMES DAILY BEFORE MEALS MAX DAILY 45 UNITS   OneTouch Verio w/Device Kit 1 Device by Does not apply route daily in the afternoon.   Pen Needles 31G X 5 MM Misc 1 each by Does not apply route in the morning, at noon, in the evening, and at  bedtime.   potassium chloride  10 MEQ tablet Commonly known as: KLOR-CON  TAKE 1 TABLET BY MOUTH EVERY DAY   simvastatin  20 MG tablet Commonly known as: ZOCOR  TAKE ONE TABLET (20 MG TOTAL) BY MOUTH DAILY AT 5PM EVERY EVENING   SYRINGE-NEEDLE (DISP) 3 ML 25G X 1 3 ML Misc Inject 1 ml into the skin 3 times daily.   Tresiba FlexTouch 100 UNIT/ML FlexTouch Pen Generic drug: insulin  degludec Inject 54 Units into the skin daily. Started by: Donell PARAS Kynnadi Dicenso   Vitamin D  (Cholecalciferol ) 25 MCG (1000 UT) Caps Take 2 capsules by mouth daily.          OBJECTIVE:   PHYSICAL EXAM: VS: BP 122/80 (BP Location: Left Arm, Patient Position: Sitting, Cuff Size: Normal)   Pulse 85   Ht 5' 7 (1.702 m)   Wt 213 lb 12.8 oz (97 kg)   SpO2 97%   BMI 33.49 kg/m    EXAM: General: Pt appears well and is in NAD  Neck:  Thyroid : No goiter or nodules appreciated.   Lungs: Clear with good BS bilat   Heart: Auscultation: RRR.  Extremities:  BL LE: Stasis dermatitis with trace edema B/L   Mental Status: Judgment, insight: Intact Orientation: Oriented to time, place, and person Mood and affect: No depression, anxiety, or agitation   DM Foot exam 07/10/2024 per podiatry   DATA REVIEWED:   Latest Reference Range & Units 08/26/24 09:41  Sodium 135 - 145 mEq/L 138  Potassium 3.5 - 5.1 mEq/L 4.1  Chloride 96 - 112 mEq/L 101  CO2 19 - 32 mEq/L 29  Glucose 70 - 99 mg/dL 827 (H)  BUN 6 - 23 mg/dL 27 (H)  Creatinine 9.59 - 1.20 mg/dL 8.68 (H)  Calcium 8.4 - 10.5 mg/dL 9.3  Phosphorus 2.3 - 4.6 mg/dL 5.4 (H)  Alkaline Phosphatase 39 - 117 U/L 150 (H)  Albumin 3.5 - 5.2 g/dL 3.7  AST 0 - 37 U/L 15  ALT 0 - 35 U/L 10  Total Protein 6.0 - 8.3 g/dL 7.1  Total Bilirubin 0.2 - 1.2 mg/dL 0.4  GFR >39.99 mL/min 42.30 (L)    Latest Reference Range & Units 08/26/24  09:41 08/26/24 09:43  C206 ACTH  6 - 50 pg/mL 29   Cortisol, Serum LCMS ug/dL  10 (H)  LH mIU/mL  <9.7 (L)  FSH mIU/mL  <0.7  (L)  Prolactin ng/mL 1.4 (L)   Glucose 70 - 99 mg/dL 827 (H)   Estradiol pg/mL <30     Latest Reference Range & Units 08/26/24 09:41  T4,Free(Direct) 0.60 - 1.60 ng/dL 8.80     Latest Reference Range & Units 08/26/24 09:41  C-Reactive Protein, Cardiac 0.000 - 5.000 mg/L 1.600  Pro B Natriuretic peptide (BNP) 0.0 - 100.0 pg/mL 91.0  Total CHOL/HDL Ratio  2  Cholesterol 0 - 200 mg/dL 857  HDL Cholesterol >60.99 mg/dL 35.89  LDL (calc) 0 - 99 mg/dL 60  MICROALB/CREAT RATIO 0.0 - 30.0 mg/g 21.1  NonHDL  77.99  Triglycerides 0.0 - 149.0 mg/dL 08.9  VLDL 0.0 - 59.9 mg/dL 81.7      Brain MRI 0/88/7976   Sella: Stable distorted appearance of sella with thickened infundibulum contiguous with enhancing tissue at the right. Heterogeneous tissue on the left extending into the anterior suprasellar cistern and through a defect in the anterior left sellar floor. Unchanged probable involvement of the left cavernous sinus. Unchanged mild tethering of the optic chiasm.   Brain: There is no acute infarction or intracranial hemorrhage. There is no intracranial mass, mass effect, or edema. There is no hydrocephalus or extra-axial fluid collection. Ventricles and sulci are normal in size and configuration. Patchy foci of T2 hyperintensity in the supratentorial white matter are nonspecific but probably reflects stable chronic microvascular ischemic changes. Chronic T1 shortening, susceptibility, and T2 hyperintensity in the cerebellar hemispheres. Chronic T1 shortening and susceptibility in the basal ganglia bilaterally. These areas of susceptibility likely reflect mineralization. No abnormal enhancement.   Vascular: Major vessel flow voids at the skull base are preserved.   Skull and upper cervical spine: Normal marrow signal is preserved.   Sinuses/Orbits: Paranasal sinus mucosal thickening including lobular thickening in the frontal sinuses. Orbits are unremarkable.   Other: Mastoid  air cells are clear.   IMPRESSION: Stable appearance of treated sella.     Thyroid  ultrasound 06/09/2024   FINDINGS: Parenchymal Echotexture: Moderately heterogeneous   Isthmus: 0.6 cm ,previously 0.5 cm   Right lobe: 4.5 x 2.0 x 2.1 cm ,previously 5.7 x 2.6 x 2.5 cm   Left lobe: 5.2 x 2.2 x 2.5 cm ,previously 4.3 x 2.7 x 2.0 cm   ________________________________________________________   Estimated total number of nodules >/= 1 cm: 3   Number of spongiform nodules >/=  2 cm not described below (TR1): 0   Number of mixed cystic and solid nodules >/= 1.5 cm not described below (TR2): 0   _________________________________________________________   Similar benign appearing solid cystic nodule in the right superior thyroid  (labeled 1, 1.0 cm, previously 24.0 cm).   Interval development of benign appearing cystic nodule in the right mid thyroid  (labeled 2, 0.6 cm) which does not require additional follow-up (TI-RADS category 1).   Similar benign appearing solid nodule in the right inferior thyroid  (labeled 3, 0.9 cm, previously labeled 2, 0.8 cm).   Similar benign appearing solid nodule in the right inferior thyroid  (labeled 4, 1.6 cm, previously with 3, 1.9 cm).   Nodule # 5:   Prior biopsy: No   Location: Left; Mid   Maximum size: 1.6 cm; Other 2 dimensions: 1.4 x 0.9 cm, previously, 1.9 x 1.5 x 0.9 cm   Composition: solid/almost completely solid (2)  Echogenicity: isoechoic (1)   Shape: not taller-than-wide (0)   Margins: smooth (0)   Echogenic foci: none (0)   ACR TI-RADS total points: 3.   ACR TI-RADS risk category:  TR3 (3 points).   Significant change in size (>/= 20% in two dimensions and minimal increase of 2 mm): No   Change in features: No   Change in ACR TI-RADS risk category: No   ACR TI-RADS recommendations:   This nodule now demonstrates 5 year stability and is therefore declared benign.    _________________________________________________________   No cervical lymphadenopathy.   IMPRESSION: 1. Similar appearing multinodular thyroid . 2. Solid nodule in the left mid thyroid  (labeled 5, 1.6 cm, previously labeled 4, 1.9 cm) demonstrates 5 years of stability and is therefore declared benign. No additional follow-up is warranted.   Old records , labs and images have been reviewed.      ASSESSMENT / PLAN / RECOMMENDATIONS:   Acromegaly   -She has been evaluated by neurosurgery in the past, patient declined transcranial surgical intervention and they have opted with radiation treatment -She is S/P radiation treatment in 2021 -She has been on octreotide  3 times daily per subcutaneous injections, unable to switch to long-acting as it is cost prohibitive -Unfortunately, she has not been on octreotide  for months due to painful injections, I did discuss the implications of an controlled acromegaly -I have recommended switching to oral octreotide  as below, cautioned against GI side effects -MRI of the brain 07/2022 shows stability -She is not a candidate for pegvisomant at this time,  due to 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  Central hypothyroidism  -Patient with nonspecific symptoms -Her most recent free T4 through PCPs office is normal --No change    Medication Continue levothyroxine  88 mcg daily  3. 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  I have prescribed [place], as per patient Basaglar  is not on the formulary -Patient was advised that she MUST check glucose 3 times daily before each meal and use the NovoLog  per correction scale - Metformin discontinued due to low GFR in June 2023 -Tradjenta  was discontinued due to ineffectiveness and persistent hyperglycemia - The patient tells me that she checks her glucose multiple times a day, but in reviewing the glucose meter I only saw data from April and July, and there is only 1 glucose check a day on average.  I will remove her correction scale and will increase her prandial dose of insulin  as below  Medication  Takes Tresiba 54 units ONCE  daily  Increase NovoLog  8 units 3 times daily before every meal     4. MNG: -No local neck symptoms - Up-to-date on thyroid  ultrasound, no further follow-up was recommended with a 5-year stability   4.CKDIII/Microalbuminuria :   - Patient on losartan    follow-up in 3 months   Signed electronically by: Stefano Redgie Butts, MD  [facility] End[facility]  [facility] [address] [place], [place] [address] Phone: [phone] FAX: [phone]      CC: Sebastian Beverley NOVAK, MD [address] [place] [address] Phone: [phone]  Fax: [phone]   Return to Endocrinology clinic as below: Future Appointments  Date Time Provider Department Center  09/12/2024 10:00 AM [facility] 1 [facility] [place]  09/26/2024 11:00 AM Sebastian Beverley NOVAK, MD [facility] [place]  10/08/2024 10:15 AM Christine Rush, DPM  [facility]  12/05/2024 11:30 AM Nik Gorrell, Donell Redgie, MD [facility] None  04/27/2025 11:30 AM [facility]-ANNUAL WELLNESS VISIT [facility] [place]

## 2024-09-02 NOTE — Progress Notes (Signed)
 Complex Care Management Note Care Guide Note  09/02/2024 Name: Danielle Schroeder MRN: [id number] DOB: 10-Nov-1956   Complex Care Management Outreach Attempts: A third unsuccessful outreach was attempted today to offer the patient with information about available complex care management services.  Follow Up Plan:  No further outreach attempts will be made at this time. We have been unable to contact the patient to offer or enroll patient in complex care management services.  Encounter Outcome:  Patient did not call back after (3) unsuccessful attempts.  Dreama [facility]  [facility], [facility] VBCI Assistant Direct Dial: [phone]  Fax: [phone]

## 2024-09-03 ENCOUNTER — Telehealth: Payer: Self-pay

## 2024-09-03 ENCOUNTER — Ambulatory Visit: Admitting: Internal Medicine

## 2024-09-03 ENCOUNTER — Encounter: Payer: Self-pay | Admitting: Internal Medicine

## 2024-09-03 VITALS — BP 122/80 | HR 85 | Ht 67.0 in | Wt 213.8 lb

## 2024-09-03 DIAGNOSIS — Z794 Long term (current) use of insulin: Secondary | ICD-10-CM

## 2024-09-03 DIAGNOSIS — E1165 Type 2 diabetes mellitus with hyperglycemia: Secondary | ICD-10-CM

## 2024-09-03 DIAGNOSIS — E1122 Type 2 diabetes mellitus with diabetic chronic kidney disease: Secondary | ICD-10-CM

## 2024-09-03 DIAGNOSIS — N1832 Chronic kidney disease, stage 3b: Secondary | ICD-10-CM

## 2024-09-03 DIAGNOSIS — D352 Benign neoplasm of pituitary gland: Secondary | ICD-10-CM | POA: Diagnosis not present

## 2024-09-03 DIAGNOSIS — E220 Acromegaly and pituitary gigantism: Secondary | ICD-10-CM | POA: Diagnosis not present

## 2024-09-03 MED ORDER — MYCAPSSA 20 MG PO CPDR: 40.0000 mg | DELAYED_RELEASE_CAPSULE | Freq: Two times a day (BID) | ORAL | 3 refills | Status: DC

## 2024-09-03 MED ORDER — TRESIBA FLEXTOUCH 100 UNIT/ML ~~LOC~~ SOPN: 54.0000 [IU] | PEN_INJECTOR | Freq: Every day | SUBCUTANEOUS | 6 refills | Status: DC

## 2024-09-03 NOTE — Patient Instructions (Addendum)
 START Mycapssa  20 mg, 1 tablet in the morning and 1 tablet in the evening for 1 week, then increase to 2 tablets in the morning and 1 tablet in the evening for another week, then increase to 2 tablets in the morning and 2 tablets in the evening (this will replace octreotide  injections)   Continue cabergoline  0.5 mg, 1 tablet TWICE WEEKLY ( Wednesday and Sunday) Take Tresiba 54 units ONCE DAILY  Take Novolog  8 units with each meal       HOW TO TREAT LOW BLOOD SUGARS (Blood sugar LESS THAN 70 MG/DL) Please follow the RULE OF 15 for the treatment of hypoglycemia treatment (when your (blood sugars are less than 70 mg/dL)   STEP 1: Take 15 grams of carbohydrates when your blood sugar is low, which includes:  3-4 GLUCOSE TABS  OR 3-4 OZ OF JUICE OR REGULAR SODA OR ONE TUBE OF GLUCOSE GEL    STEP 2: RECHECK blood sugar in 15 MINUTES STEP 3: If your blood sugar is still low at the 15 minute recheck --> then, go back to STEP 1 and treat AGAIN with another 15 grams of carbohydrates.

## 2024-09-03 NOTE — Telephone Encounter (Signed)
 Patient was identified as falling into the [place] Measure - Diabetes.   Patient was: Appointment scheduled with primary care provider in the next 30 days.

## 2024-09-04 ENCOUNTER — Telehealth: Payer: Self-pay | Admitting: Family Medicine

## 2024-09-04 DIAGNOSIS — Z780 Asymptomatic menopausal state: Secondary | ICD-10-CM

## 2024-09-04 NOTE — Telephone Encounter (Signed)
 Please reorder Dexa scan at [facility]. [facility] is no longer scheduling Dexa/Bone Density scans. Please route message back to Admin team to contact patient for scheduling.

## 2024-09-08 ENCOUNTER — Other Ambulatory Visit: Payer: Self-pay | Admitting: Family Medicine

## 2024-09-09 DIAGNOSIS — E113511 Type 2 diabetes mellitus with proliferative diabetic retinopathy with macular edema, right eye: Secondary | ICD-10-CM | POA: Diagnosis not present

## 2024-09-09 DIAGNOSIS — H43822 Vitreomacular adhesion, left eye: Secondary | ICD-10-CM | POA: Diagnosis not present

## 2024-09-09 DIAGNOSIS — E113512 Type 2 diabetes mellitus with proliferative diabetic retinopathy with macular edema, left eye: Secondary | ICD-10-CM | POA: Diagnosis not present

## 2024-09-09 DIAGNOSIS — H26492 Other secondary cataract, left eye: Secondary | ICD-10-CM | POA: Diagnosis not present

## 2024-09-11 ENCOUNTER — Other Ambulatory Visit: Payer: Self-pay | Admitting: Family Medicine

## 2024-09-11 DIAGNOSIS — K5904 Chronic idiopathic constipation: Secondary | ICD-10-CM

## 2024-09-12 ENCOUNTER — Ambulatory Visit (HOSPITAL_COMMUNITY)

## 2024-09-12 ENCOUNTER — Ambulatory Visit (HOSPITAL_COMMUNITY)
Admission: RE | Admit: 2024-09-12 | Discharge: 2024-09-12 | Disposition: A | Discharge status: Home / Self Care | Source: Ambulatory Visit | Attending: Family Medicine | Admitting: Family Medicine

## 2024-09-12 DIAGNOSIS — R9089 Other abnormal findings on diagnostic imaging of central nervous system: Secondary | ICD-10-CM

## 2024-09-12 DIAGNOSIS — E220 Acromegaly and pituitary gigantism: Secondary | ICD-10-CM | POA: Diagnosis not present

## 2024-09-12 LAB — CORTISOL DEXAMETHASONE REFLEX: Cortisol, Serum LCMS: 10 ug/dL — ABNORMAL HIGH

## 2024-09-12 LAB — DEXAMETHASONE, BLOOD: Dexamethasone, Blood: <30 ng/dL

## 2024-09-12 MED ORDER — GADOBUTROL 1 MMOL/ML IV SOLN
9.0000 mL | Freq: Once | INTRAVENOUS | Status: AC | PRN
  Administered 2024-09-12: 9 mL via INTRAVENOUS

## 2024-09-16 ENCOUNTER — Other Ambulatory Visit: Payer: Self-pay | Admitting: Family Medicine

## 2024-09-20 ENCOUNTER — Other Ambulatory Visit: Payer: Self-pay | Admitting: Family Medicine

## 2024-09-20 DIAGNOSIS — E1142 Type 2 diabetes mellitus with diabetic polyneuropathy: Secondary | ICD-10-CM

## 2024-09-22 ENCOUNTER — Other Ambulatory Visit: Payer: Self-pay | Admitting: Family Medicine

## 2024-09-22 ENCOUNTER — Encounter: Payer: Self-pay | Admitting: Internal Medicine

## 2024-09-22 DIAGNOSIS — Z1231 Encounter for screening mammogram for malignant neoplasm of breast: Secondary | ICD-10-CM

## 2024-09-26 ENCOUNTER — Encounter: Payer: Self-pay | Admitting: Family Medicine

## 2024-09-26 ENCOUNTER — Ambulatory Visit (INDEPENDENT_AMBULATORY_CARE_PROVIDER_SITE_OTHER): Admitting: Family Medicine

## 2024-09-26 VITALS — BP 128/70 | HR 78 | Temp 97.8°F | Ht 67.0 in | Wt 213.4 lb

## 2024-09-26 DIAGNOSIS — E1122 Type 2 diabetes mellitus with diabetic chronic kidney disease: Secondary | ICD-10-CM

## 2024-09-26 DIAGNOSIS — N1832 Chronic kidney disease, stage 3b: Secondary | ICD-10-CM | POA: Diagnosis not present

## 2024-09-26 DIAGNOSIS — E1142 Type 2 diabetes mellitus with diabetic polyneuropathy: Secondary | ICD-10-CM | POA: Diagnosis not present

## 2024-09-26 DIAGNOSIS — Z794 Long term (current) use of insulin: Secondary | ICD-10-CM

## 2024-09-26 DIAGNOSIS — Z860100 Personal history of colon polyps, unspecified: Secondary | ICD-10-CM | POA: Diagnosis not present

## 2024-09-26 DIAGNOSIS — I10 Essential (primary) hypertension: Secondary | ICD-10-CM

## 2024-09-26 DIAGNOSIS — B3731 Acute candidiasis of vulva and vagina: Secondary | ICD-10-CM

## 2024-09-26 NOTE — Progress Notes (Signed)
 Assessment & Plan   Assessment/Plan:    Assessment & Plan Hypertension Blood pressure elevated at 160/86 mmHg today. Previous readings were 122/80 mmHg and 130/70 mmHg. No symptoms of headache, chest pain, or shortness of breath. Current medications include carvedilol  25 mg twice daily, Lasix  40 mg BID, and a certain 100 mg daily. 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 No acute issues reported.  Type 2 diabetes mellitus with peripheral neuropathy Neuropathy symptoms have improved. Duloxetine (Cymbalta) was started but not yet picked up. Plan to start with one pill daily for a week if tolerated. - Start duloxetine (Cymbalta) one pill daily for a week if tolerated  Vaginal candidiasis, resolved Vaginal candidiasis has resolved following treatment with fluconazole .  History of colon polyps Referral for colonoscopy has not been completed. Previous colonoscopy revealed four polyps. - Follow up with gastroenterology to discuss colonoscopy scheduling        Medications Discontinued During This Encounter  Medication Reason   amLODipine  (NORVASC ) 5 MG tablet Completed Course   acetaminophen  (TYLENOL ) 325 MG tablet Completed Course    Return in about 3 months (around 12/27/2024) for BP.        Subjective:   Encounter date: 09/26/2024  Danielle Schroeder is a 68 y.o. female who has Goiter; Acquired hypothyroidism; Pituitary adenoma with extrasellar extension (HCC); Acromegaly (HCC); CHF (congestive heart failure) (HCC); Stable treated proliferative diabetic retinopathy of right eye determined by examination associated with type 2 diabetes mellitus (HCC); Proliferative diabetic retinopathy of left eye with macular edema associated with type 2 diabetes mellitus (HCC); Vitamin D  deficiency; Vitreomacular adhesion, left; DM2 (diabetes mellitus, type 2)  (HCC); Pseudophakia, both eyes; Cataract; CKD stage 3b, GFR 30-44 ml/min (HCC); Diabetic neuropathy (HCC); Diabetic retinopathy associated with type 2 diabetes mellitus (HCC); Essential hypertension; HLD (hyperlipidemia); Morbid (severe) obesity due to excess calories (HCC); Pituitary mass; 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   She  has a past medical history of Cataract, CHF (congestive heart failure) (HCC), Diabetes mellitus without complication (HCC), Hyperlipidemia, Hypertension, Osteomyelitis of right lower extremity (HCC) (05/12/2023), Pituitary adenoma with extrasellar extension (HCC), Retinal hemorrhage of left eye (03/02/2020), Retinal hemorrhage of right eye (03/02/2020), Retinal microaneurysm of both eyes (03/02/2020), Sepsis (HCC) (05/13/2023), Sleep apnea, Thyroid  disease, and Tubular adenoma of colon (07/20/2022)..   She presents with chief complaint of Diabetes ( 1 month f/u DM.  No concerns.   Has jury duty paper to fill out) .   Discussed the use of AI scribe software for clinical note transcription with the patient, who gave verbal consent to proceed.  History of Present Illness Danielle Schroeder is a 68 year old female with hypertension and type 2 diabetes mellitus who presents for blood pressure assessment and follow-up on recent vaginal candidiasis.  Hypertension - Blood pressure elevated at 160/86 mmHg today, previously around 122/80 mmHg - Currently taking carvedilol  25 mg twice daily, furosemide  40 mg twice daily, and losartan  100 mg daily - Consumed  'pork fries' the night before the visit - No headache, chest pain, or shortness of  breath  Vaginal candidiasis - Recently completed treatment with fluconazole  (Diflucan ) - Significant improvement in symptoms - No ongoing complaints  Type 2 diabetes mellitus with chronic kidney disease - Type 2 diabetes mellitus associated with chronic kidney disease, stage 3B - Recent laboratory work performed  Peripheral neuropathy - Experiencing neuropathy symptoms - Prescribed duloxetine (Cymbalta), not yet started, plans to begin with one pill daily - Symptoms currently 'not as bad'  Colonic polyps - History of approximately four colon polyps identified one year ago - Awaiting referral for follow-up colonoscopy   ROS  Past Surgical History:  Procedure Laterality Date   ABDOMINAL AORTOGRAM W/LOWER EXTREMITY Left 04/11/2023   Procedure: ABDOMINAL AORTOGRAM W/LOWER EXTREMITY;  Surgeon: Lanis Fonda BRAVO, MD;  Location: [facility] INVASIVE CV LAB;  Service: Cardiovascular;  Laterality: Left;   ABDOMINAL AORTOGRAM W/LOWER EXTREMITY N/A 05/16/2023   Procedure: ABDOMINAL AORTOGRAM W/LOWER EXTREMITY;  Surgeon: Lanis Fonda BRAVO, MD;  Location: [facility] INVASIVE CV LAB;  Service: Cardiovascular;  Laterality: N/A;   AMPUTATION Right 05/18/2023   Procedure: RIGHT GREAT TOE AMPUTATION;  Surgeon: Sheree Penne Bruckner, MD;  Location: [facility] OR;  Service: Vascular;  Laterality: Right;   BREAST EXCISIONAL BIOPSY Left    benign   BREAST SURGERY     CHOLECYSTECTOMY     PERIPHERAL VASCULAR BALLOON ANGIOPLASTY Left 04/11/2023   Procedure: PERIPHERAL VASCULAR BALLOON ANGIOPLASTY;  Surgeon: Lanis Fonda BRAVO, MD;  Location: [facility] INVASIVE CV LAB;  Service: Cardiovascular;  Laterality: Left;  Lt AT   PERIPHERAL VASCULAR INTERVENTION Left 04/11/2023   Procedure: PERIPHERAL VASCULAR INTERVENTION;  Surgeon: Lanis Fonda BRAVO, MD;  Location: [facility] INVASIVE CV LAB;  Service: Cardiovascular;  Laterality: Left;  Lt SFA   PERIPHERAL VASCULAR  INTERVENTION  05/16/2023   Procedure: PERIPHERAL VASCULAR INTERVENTION;  Surgeon: Lanis Fonda BRAVO, MD;  Location: [facility] INVASIVE CV LAB;  Service: Cardiovascular;;   TONSILLECTOMY     TUBAL LIGATION      Current Outpatient Medications on File Prior to Visit  Medication Sig Dispense Refill   aspirin  EC 81 MG tablet Take 81 mg by mouth in the morning.     Blood Glucose Monitoring Suppl (ONETOUCH VERIO) w/Device KIT 1 Device by Does not apply route daily in the afternoon. 1 kit 0   cabergoline  (DOSTINEX ) 0.5 MG tablet TAKE 1/2 TABLET (0.25 MG TOTAL) BY MOUTH TWICE A WEEK 24 tablet 3   carvedilol  (COREG ) 25 MG tablet TAKE 1 TABLET (25 MG TOTAL) BY MOUTH TWICE A DAY WITH MEALS 90 tablet 3   clopidogrel  (PLAVIX ) 75 MG tablet Take 75 mg by mouth daily.     clotrimazole  (LOTRIMIN ) 1 % cream APPLY TO AFFECTED AREA TWICE A DAY 60 g 0   DULoxetine (CYMBALTA) 30 MG capsule TAKE 1 CAPSULE BY MOUTH DAILY FOR 7 DAY, THEN TAKE 2 CAPSULES DAILY. 180 capsule 2   fluconazole  (DIFLUCAN ) 150 MG tablet Take 1 tablet (150 mg total) by mouth every three (3) days as needed. 2 tablet 0   furosemide  (LASIX ) 40 MG tablet Take 1 tablet (40 mg total) by mouth 2 (two) times daily. 180 tablet 3   glucose blood test strip 1 each by Other route 3 (three) times daily. Use as instructed 300 each 12   hydrocortisone  2.5 % ointment APPLY TOPICALLY 2 (TWO) TIMES DAILY. AS NEEDED FOR MILD ECZEMA. DO NOT USE FOR MORE THAN 1-2 WEEKS AT A TIME. 20 g 3   insulin  degludec (TRESIBA FLEXTOUCH) 100 UNIT/ML FlexTouch Pen Inject 54 Units into the skin  daily. 45 mL 6   Insulin  Pen Needle (PEN NEEDLES) 31G X 5 MM MISC 1 each by Does not apply route in the morning, at noon, in the evening, and at bedtime. 400 each 3   levothyroxine  (SYNTHROID ) 88 MCG tablet Take 1 tablet (88 mcg total) by mouth daily. 90 tablet 3   linaclotide (LINZESS) 72 MCG capsule Take 1 capsule (72 mcg total) by mouth daily before breakfast. 30 capsule 0   losartan  (COZAAR )  100 MG tablet Take 1 tablet (100 mg total) by mouth daily. 90 tablet 3   NOVOLOG  FLEXPEN 100 UNIT/ML FlexPen INJECT 0-6 UNITS SUBCUTANEOUSLY THREE TIMES DAILY BEFORE MEALS MAX DAILY 45 UNITS 45 mL 1   Octreotide  Acetate (MYCAPSSA ) 20 MG CPDR Take 40 mg by mouth in the morning and at bedtime. 360 capsule 3   potassium chloride  (KLOR-CON ) 10 MEQ tablet TAKE 1 TABLET BY MOUTH EVERY DAY 90 tablet 3   simvastatin  (ZOCOR ) 20 MG tablet TAKE ONE TABLET BY MOUTH DAILY AT 5PM 90 tablet 3   SYRINGE-NEEDLE, DISP, 3 ML 25G X 1 3 ML MISC Inject 1 ml into the skin 3 times daily. 100 each 11   Vitamin D , Cholecalciferol , 25 MCG (1000 UT) CAPS Take 2 capsules by mouth daily. 60 capsule 0   No current facility-administered medications on file prior to visit.    Family History  Problem Relation Age of Onset   Thyroid  disease Neg Hx    Colon cancer Neg Hx    Esophageal cancer Neg Hx    Rectal cancer Neg Hx    Stomach cancer Neg Hx    BRCA 1/2 Neg Hx    Breast cancer Neg Hx     Social History   Socioeconomic History   Marital status: Single    Spouse name: Not on file   Number of children: Not on file   Years of education: Not on file   Highest education level: Not on file  Occupational History   Not on file  Tobacco Use   Smoking status: Never   Smokeless tobacco: Never  Vaping Use   Vaping status: Never Used  Substance and Sexual Activity   Alcohol use: Never   Drug use: Never   Sexual activity: Yes  Other Topics Concern   Not on file  Social History Narrative   Not on file   Social Drivers of Health   Financial Resource Strain: Low Risk  (04/25/2024)   Overall Financial Resource Strain (CARDIA)    Difficulty of Paying Living Expenses: Not hard at all  Food Insecurity: No Food Insecurity (04/25/2024)   Hunger Vital Sign    Worried About Running Out of Food in the Last Year: Never true    Ran Out of Food in the Last Year: Never true  Transportation Needs: No Transportation Needs  (04/25/2024)   PRAPARE - Administrator, Civil Service (Medical): No    Lack of Transportation (Non-Medical): No  Physical Activity: Inactive (04/25/2024)   Exercise Vital Sign    Days of Exercise per Week: 0 days    Minutes of Exercise per Session: 0 min  Stress: No Stress Concern Present (04/25/2024)   [employer] of Occupational Health - Occupational Stress Questionnaire    Feeling of Stress: Not at all  Social Connections: Moderately Isolated (04/25/2024)   Social Connection and Isolation Panel    Frequency of Communication with Friends and Family: Twice a week    Frequency of Social Gatherings with  Friends and Family: Twice a week    Attends Religious Services: 1 to 4 times per year    Active Member of [employer] or Organizations: No    Attends Banker Meetings: Never    Marital Status: Divorced  Catering Manager Violence: Not At Risk (04/25/2024)   Humiliation, Afraid, Rape, and Kick questionnaire    Fear of Current or Ex-Partner: No    Emotionally Abused: No    Physically Abused: No    Sexually Abused: No                                                                                                  Objective:  Physical Exam: BP 128/70 Comment: home  Pulse 78   Temp 97.8 F (36.6 C) (Temporal)   Ht 5' 7 (1.702 m)   Wt 213 lb 6.4 oz (96.8 kg)   SpO2 100%   BMI 33.42 kg/m    BP Readings from Last 3 Encounters:  09/26/24 128/70  09/03/24 122/80  08/26/24 135/79    Physical Exam           Physical Exam Constitutional:      General: She is not in acute distress.    Appearance: Normal appearance. She is not ill-appearing or toxic-appearing.  HENT:     Head: Normocephalic and atraumatic.     Nose: Nose normal. No congestion.  Eyes:     General: No scleral icterus.    Extraocular Movements: Extraocular movements intact.  Cardiovascular:     Rate and Rhythm: Normal rate and regular rhythm.     Pulses: Normal pulses.     Heart  sounds: Normal heart sounds.  Pulmonary:     Effort: Pulmonary effort is normal. No respiratory distress.     Breath sounds: Normal breath sounds.  Abdominal:     General: Abdomen is flat. Bowel sounds are normal.     Palpations: Abdomen is soft.  Musculoskeletal:        General: Normal range of motion.  Lymphadenopathy:     Cervical: No cervical adenopathy.  Skin:    General: Skin is warm and dry.     Findings: No rash.  Neurological:     General: No focal deficit present.     Mental Status: She is alert and oriented to person, place, and time. Mental status is at baseline.  Psychiatric:        Mood and Affect: Mood normal.        Behavior: Behavior normal.        Thought Content: Thought content normal.        Judgment: Judgment normal.     MR Brain W Wo Contrast Result Date: 09/16/2024 CLINICAL DATA:  Follow-up brain tumor, history of acromegaly EXAM: MRI HEAD WITHOUT AND WITH CONTRAST TECHNIQUE: Multiplanar, multiecho pulse sequences of the brain and surrounding structures were obtained without and with intravenous contrast. CONTRAST:  9mL GADAVIST  GADOBUTROL  1 MMOL/ML IV SOLN COMPARISON:  July 24, 2022 FINDINGS: MRI brain: There is abnormal enhancing tissue which extends from the sella to the floor of the third ventricle,  unchanged from the prior study. There is marked calvarial thickening. There are linear areas of T2 hyperintensity in the periventricular white matter with low intensity on T1, without enhancement. There are areas of mineralization in the dentate nuclei of the cerebellum and the basal ganglia. No abnormal enhancement within the brain parenchyma. There is no acute or chronic infarct. No hydrocephalus. There are normal flow signals in the carotid arteries and basilar artery. No significant bone marrow signal abnormality. No significant abnormality in the paranasal sinuses. IMPRESSION: 1. There is abnormal enhancing soft tissue which extends from the sella to the  floor of the third ventricle, unchanged. 2. Linear periventricular T2 hyperintensities without enhancement are unchanged 3. Basal ganglia and cerebellar mineralization unchanged Electronically Signed   By: Nancyann Burns M.D.   On: 09/16/2024 09:47    Recent Results (from the past 2160 hours)  POCT glycosylated hemoglobin (Hb A1C)     Status: Abnormal   Collection Time: 08/26/24  9:00 AM  Result Value Ref Range   Hemoglobin A1C 8.1 (A) 4.0 - 5.6 %   HbA1c POC (<> result, manual entry)     HbA1c, POC (prediabetic range)     HbA1c, POC (controlled diabetic range)    Cervicovaginal ancillary only( [place] [facility])     Status: Abnormal   Collection Time: 08/26/24  9:29 AM  Result Value Ref Range   Neisseria Gonorrhea Negative    Chlamydia Negative    Trichomonas Negative    Bacterial Vaginitis (gardnerella) Negative    Candida Vaginitis Positive (A)    Candida Glabrata Positive (A)    Comment      Normal Reference Range Bacterial Vaginosis - Negative   Comment Normal Reference Range Candida Species - Negative    Comment Normal Reference Range Candida Galbrata - Negative    Comment Normal Reference Range Trichomonas - Negative    Comment Normal Reference Ranger Chlamydia - Negative    Comment      Normal Reference Range Neisseria Gonorrhea - Negative  Lipid panel     Status: None   Collection Time: 08/26/24  9:41 AM  Result Value Ref Range   Cholesterol 142 0 - 200 mg/dL    Comment: ATP III Classification       Desirable:  < 200 mg/dL               Borderline High:  200 - 239 mg/dL          High:  > = 759 mg/dL   Triglycerides 08.9 0.0 - 149.0 mg/dL    Comment: Normal:  <849 mg/dLBorderline High:  150 - 199 mg/dL   HDL 35.89 >60.99 mg/dL   VLDL 81.7 0.0 - 59.9 mg/dL   LDL Cholesterol 60 0 - 99 mg/dL   Total CHOL/HDL Ratio 2     Comment:                Men          Women1/2 Average Risk     3.4          3.3Average Risk          5.0          4.42X Average Risk          9.6          7.13X  Average Risk          15.0          11.0  NonHDL 77.99     Comment: NOTE:  Non-HDL goal should be 30 mg/dL higher than patient's LDL goal (i.e. LDL goal of < 70 mg/dL, would have non-HDL goal of < 100 mg/dL)  Microalbumin / creatinine urine ratio     Status: None   Collection Time: 08/26/24  9:41 AM  Result Value Ref Range   Microalb, Ur 1.2 0.0 - 1.9 mg/dL   Creatinine,U 42.0 mg/dL   Microalb Creat Ratio 21.1 0.0 - 30.0 mg/g  Vitamin D  1,25 dihydroxy     Status: None   Collection Time: 08/26/24  9:41 AM  Result Value Ref Range   Vitamin D  1, 25 (OH)2 Total 52 18 - 72 pg/mL   Vitamin D3 1, 25 (OH)2 52 pg/mL   Vitamin D2 1, 25 (OH)2 <8 pg/mL    Comment: (Note) Vitamin D3, 1,25(OH)2 indicates both endogenous  production and supplementation. Vitamin D2, 1,25(OH)2 is  an indicator of exogenous sources, such as diet or  supplementation. Interpretation and therapy are based on  measurement of Vitamin D , 1,25 (OH)2, Total. . This test was developed, and its analytical performance  characteristics have been determined by [employer]. It has not been cleared or approved by the  FDA. This assay has been validated pursuant to the CLIA  regulations and is used for clinical purposes. . For additional information, please refer to http://education.[facility].com/faq/FAQ199 (This link is being provided for  informational/educational purposes only.) . [facility] [address] [place] [address] [phone] Johanna Agent L. Gino, MD, PhD   CBC with Differential/Platelet     Status: Abnormal   Collection Time: 08/26/24  9:41 AM  Result Value Ref Range   WBC 4.9 4.0 - 10.5 K/uL   RBC 3.99 3.87 - 5.11 Mil/uL   Hemoglobin 10.7 (L) 12.0 - 15.0 g/dL   HCT 67.1 (L) 63.9 - 53.9 %   MCV 82.2 78.0 - 100.0 fl   MCHC 32.6 30.0 - 36.0 g/dL   RDW 84.7 88.4 - 84.4 %   Platelets 222.0 150.0 - 400.0 K/uL   Neutrophils Relative % 62.1  43.0 - 77.0 %   Lymphocytes Relative 28.1 12.0 - 46.0 %   Monocytes Relative 6.1 3.0 - 12.0 %   Eosinophils Relative 3.3 0.0 - 5.0 %   Basophils Relative 0.4 0.0 - 3.0 %   Neutro Abs 3.1 1.4 - 7.7 K/uL   Lymphs Abs 1.4 0.7 - 4.0 K/uL   Monocytes Absolute 0.3 0.1 - 1.0 K/uL   Eosinophils Absolute 0.2 0.0 - 0.7 K/uL   Basophils Absolute 0.0 0.0 - 0.1 K/uL  Comprehensive metabolic panel with GFR     Status: Abnormal   Collection Time: 08/26/24  9:41 AM  Result Value Ref Range   Sodium 138 135 - 145 mEq/L   Potassium 4.1 3.5 - 5.1 mEq/L   Chloride 101 96 - 112 mEq/L   CO2 29 19 - 32 mEq/L   Glucose, Bld 172 (H) 70 - 99 mg/dL   BUN 27 (H) 6 - 23 mg/dL   Creatinine, Ser 8.68 (H) 0.40 - 1.20 mg/dL   Total Bilirubin 0.4 0.2 - 1.2 mg/dL   Alkaline Phosphatase 150 (H) 39 - 117 U/L   AST 15 0 - 37 U/L   ALT 10 0 - 35 U/L   Total Protein 7.1 6.0 - 8.3 g/dL   Albumin 3.7 3.5 - 5.2 g/dL   GFR 57.69 (L) >39.99 mL/min    Comment: Calculated  using the CKD-EPI Creatinine Equation (2021)   Calcium 9.3 8.4 - 10.5 mg/dL  Urinalysis w microscopic + reflex cultur     Status: Abnormal   Collection Time: 08/26/24  9:41 AM   Specimen: Blood  Result Value Ref Range   Color, Urine YELLOW YELLOW   APPearance CLEAR CLEAR   Specific Gravity, Urine 1.011 1.001 - 1.035   pH 6.0 5.0 - 8.0   Glucose, UA NEGATIVE NEGATIVE   Bilirubin Urine NEGATIVE NEGATIVE   Ketones, ur NEGATIVE NEGATIVE   Hgb urine dipstick NEGATIVE NEGATIVE   Protein, ur NEGATIVE NEGATIVE   Nitrites, Initial NEGATIVE NEGATIVE   Leukocyte Esterase 2+ (A) NEGATIVE   WBC, UA 0-5 0 - 5 /HPF   RBC / HPF NONE SEEN 0 - 2 /HPF   Squamous Epithelial / HPF 0-5 < OR = 5 /HPF   Bacteria, UA FEW (A) NONE SEEN /HPF   Hyaline Cast NONE SEEN NONE SEEN /LPF   Note      Comment: This urine was analyzed for the presence of WBC,  RBC, bacteria, casts, and other formed elements.  Only those elements seen were reported. . .   Iron, TIBC and  Ferritin Panel     Status: None   Collection Time: 08/26/24  9:41 AM  Result Value Ref Range   Iron 73 45 - 160 mcg/dL   TIBC 634 749 - 549 mcg/dL (calc)   %SAT 20 16 - 45 % (calc)   Ferritin 52 16 - 288 ng/mL  B12 and Folate Panel     Status: None   Collection Time: 08/26/24  9:41 AM  Result Value Ref Range   Vitamin B-12 346 211 - 911 pg/mL   Folate 13.9 >5.9 ng/mL  Insulin -like growth factor     Status: Abnormal   Collection Time: 08/26/24  9:41 AM  Result Value Ref Range   IGF-I, LC/MS 752 (H) 41 - 279 ng/mL   Z-Score (Female) 4.7 (H) -2.0 - 2.0 SD    Comment: . This test was developed and its analytical performance characteristics have been determined by [employer]. It has not been cleared or approved by the FDA. This assay has been validated pursuant to the CLIA regulations and is used for clinical purposes. .   B Nat Peptide     Status: None   Collection Time: 08/26/24  9:41 AM  Result Value Ref Range   Pro B Natriuretic peptide (BNP) 91.0 0.0 - 100.0 pg/mL  PTH, intact (no Ca)     Status: Abnormal   Collection Time: 08/26/24  9:41 AM  Result Value Ref Range   PTH 118 (H) 16 - 77 pg/mL    Comment: . Interpretive Guide    Intact PTH           Calcium ------------------    ----------           ------- Normal Parathyroid    Normal               Normal Hypoparathyroidism    Low or Low Normal    Low Hyperparathyroidism    Primary            Normal or High       High    Secondary          High                 Normal or Low    Tertiary  High                 High Non-Parathyroid    Hypercalcemia      Low or Low Normal    High .   Phosphorus     Status: Abnormal   Collection Time: 08/26/24  9:41 AM  Result Value Ref Range   Phosphorus 5.4 (H) 2.3 - 4.6 mg/dL  Prolactin     Status: Abnormal   Collection Time: 08/26/24  9:41 AM  Result Value Ref Range   Prolactin 1.4 (L) ng/mL    Comment:             Reference Range  Females         Non-pregnant         3.0-30.0         Pregnant           10.0-209.0         Postmenopausal      2.0-20.0 . . .   ACTH      Status: None   Collection Time: 08/26/24  9:41 AM  Result Value Ref Range   C206 ACTH  29 6 - 50 pg/mL    Comment: . Reference range applies only to specimens collected between 7am-10am. .   Estradiol     Status: None   Collection Time: 08/26/24  9:41 AM  Result Value Ref Range   Estradiol <30 pg/mL    Comment:       Reference Range       Female:         Follicular Phase:    30-144         Mid-Cycle:           64-357         Luteal Phase:        56-214         Postmenopausal:      < or = 31 . Reference range established on post-pubertal patient population. No pre-pubertal reference range established using this assay. For any patients for whom low Estradiol levels are anticipated (e.g. males, pre-pubertal children and hypogonadal/post-menopausal  females), the [employer] Estradiol, Ultrasensitive, LCMSMS assay is recommended (order code 69710). . Please note: patients being treated with the drug  fulvestrant (Faslodex(R)) have demonstrated significant  interference in immunoassay methods for estradiol  measurement. The cross reactivity could lead to falsely  elevated estradiol test results leading to an  inappropriate clinical assessment of estrogen status. [facility] order code 30289-Estradiol,  Ultrasensitive LC/MS/MS demonstrates neglig ible cross  reactivity with fulvestrant.   T4, free     Status: None   Collection Time: 08/26/24  9:41 AM  Result Value Ref Range   Free T4 1.19 0.60 - 1.60 ng/dL    Comment: Specimens from patients who are undergoing biotin therapy and /or ingesting biotin supplements may contain high levels of biotin.  The higher biotin concentration in these specimens interferes with this Free T4 assay.  Specimens that contain high levels  of biotin may cause false high results for this Free T4 assay.  Please  interpret results in light of the total clinical presentation of the patient.    TSH     Status: Abnormal   Collection Time: 08/26/24  9:41 AM  Result Value Ref Range   TSH 0.01 (L) 0.35 - 5.50 uIU/mL  CRP High sensitivity     Status: None   Collection Time: 08/26/24  9:41 AM  Result Value Ref  Range   CRP, High Sensitivity 1.600 0.000 - 5.000 mg/L    Comment: Note:  An elevated hs-CRP (>5 mg/L) should be repeated after 2 weeks to rule out recent infection or trauma.  Insulin , random     Status: Abnormal   Collection Time: 08/26/24  9:41 AM  Result Value Ref Range   Insulin  45.9 (H) uIU/mL    Comment:       Reference Range  < or = 18.4 .       Risk:       Optimal          < or = 18.4       Moderate         NA       High             >18.4 .       Adult cardiovascular event risk category       cut points (optimal, moderate, high)       are based on Insulin  Reference Interval       studies performed at [facility] [facility]       in 2022. SABRA   Urine Culture     Status: None   Collection Time: 08/26/24  9:41 AM  Result Value Ref Range   MICRO NUMBER: [id number]    SPECIMEN QUALITY: Adequate    Sample Source URINE    STATUS: FINAL    Result: No Growth   REFLEXIVE URINE CULTURE     Status: None   Collection Time: 08/26/24  9:41 AM  Result Value Ref Range   REFLEXIVE URINE CULTURE      Comment: CULTURE INDICATED - RESULTS TO FOLLOW  Cortisol Dexamethasone Reflex     Status: Abnormal   Collection Time: 08/26/24  9:43 AM  Result Value Ref Range   Cortisol, Serum LCMS 10 (H) ug/dL    Comment: A serum cortisol level greater than 1.8 ug/dL after the single-dose overnight dexamethasone is considered a positive test.  Refer to the [facility] Syllabus for more detail.  This test was developed and its performance characteristics determined by [facility]. It has not been cleared or approved by the Food and Drug Administration. Reference Range: <1.8    Dexamethasone Reflex  Comment     Comment: Dexamethasone testing added  FSH/LH     Status: Abnormal   Collection Time: 08/26/24  9:43 AM  Result Value Ref Range   FSH <0.7 (L) mIU/mL    Comment:                     Reference Range .              Follicular Phase       2.5-10.2              Mid-cycle Peak         3.1-17.7              Luteal Phase           1.5- 9.1              Postmenopausal       23.0-116.3              .    LH <0.2 (L) mIU/mL    Comment:     Reference Range Follicular Phase  1.9-12.5 Mid-Cycle Peak    8.7-76.3 Luteal Phase      0.5-16.9 Postmenopausal    10.0-54.7  Dexamethasone, blood     Status: None   Collection Time: 08/26/24  9:43 AM  Result Value Ref Range   Dexamethasone, Blood <30 ng/dL    Comment: This test was developed and its performance characteristics determined by [facility]. It has not been cleared or approved by the Food and Drug Administration.  Reference Range: Adults baseline: <30 8:00 AM following 1 mg dexamethasone  previous evening:  140 - 295 8:00 AM following 8 mg dexamethasone (4 x 2 mg doses) previous day:  1600 - 2850         Phillip Maffei B Rabia Argote, MD  I,Emily Lagle,acting as a scribe for Beverley KATHEE Hummer, MD.,have documented all relevant documentation on the behalf of Beverley KATHEE Hummer, MD.  LILLETTE Beverley KATHEE Hummer, MD, have reviewed all documentation for this visit. The documentation on 09/26/2024 for the exam, diagnosis, procedures, and orders are all accurate and complete.

## 2024-09-26 NOTE — Patient Instructions (Addendum)
 It was very nice to see you today!  VISIT SUMMARY: Today, we reviewed your blood pressure, diabetes, and recent vaginal candidiasis. We also discussed your peripheral neuropathy and the need for a follow-up colonoscopy.  YOUR PLAN: HYPERTENSION: Your blood pressure was elevated today at 160/86 mmHg. -We have added amlodipine  to your medication regimen. -Continue taking carvedilol  25 mg twice daily, furosemide  40 mg twice daily, and losartan  100 mg daily. -Follow up for blood pressure in one month.  CHRONIC KIDNEY DISEASE STAGE 3B: Your renal function is well-managed and there are no acute issues reported.  PERIPHERAL NEUROPATHY: You have been experiencing neuropathy symptoms, which have improved. -Start taking duloxetine (Cymbalta) one pill daily for a week if tolerated.  VAGINAL CANDIDIASIS, RESOLVED: Your vaginal candidiasis has resolved following treatment with fluconazole .  HISTORY OF COLON POLYPS: You have a history of colon polyps and need a follow-up colonoscopy. -Follow up with referral for colonoscopy. -Contact gastroenterology to discuss colonoscopy scheduling.  WEIGHT MANAGEMENT:  Continue to work on healthy diet and exercise to help manage obesity, blood pressure, and diabetes.   Return in about 3 months (around 12/27/2024) for BP.   Take care, Arvella Hummer, MD, MS   PLEASE NOTE:  If you had any lab tests, please let us  know if you have not heard back within a few days. You may see your results on mychart before we have a chance to review them but we will give you a call once they are reviewed by us .   If we ordered any referrals today, please let us  know if you have not heard from their office within the next week.   If you had any urgent prescriptions sent in today, please check with the pharmacy within an hour of our visit to make sure the prescription was transmitted appropriately.   Please try these tips to maintain a healthy lifestyle:  Eat at least 3 REAL  meals and 1-2 snacks per day.  Aim for no more than 5 hours between eating.  If you eat breakfast, please do so within one hour of getting up.   Each meal should contain half fruits/vegetables, one quarter protein, and one quarter carbs (no bigger than a computer mouse)  Cut down on sweet beverages. This includes juice, soda, and sweet tea.   Drink at least 1 glass of water with each meal and aim for at least 8 glasses per day  Exercise at least 150 minutes every week.

## 2024-09-30 ENCOUNTER — Encounter: Payer: Self-pay | Admitting: Family Medicine

## 2024-09-30 DIAGNOSIS — Z860100 Personal history of colon polyps, unspecified: Secondary | ICD-10-CM | POA: Insufficient documentation

## 2024-09-30 DIAGNOSIS — E1142 Type 2 diabetes mellitus with diabetic polyneuropathy: Secondary | ICD-10-CM | POA: Insufficient documentation

## 2024-10-03 ENCOUNTER — Ambulatory Visit: Admitting: Podiatry

## 2024-10-03 DIAGNOSIS — M79671 Pain in right foot: Secondary | ICD-10-CM

## 2024-10-03 DIAGNOSIS — M79672 Pain in left foot: Secondary | ICD-10-CM

## 2024-10-03 DIAGNOSIS — B351 Tinea unguium: Secondary | ICD-10-CM

## 2024-10-03 NOTE — Progress Notes (Signed)
 Patient presents for evaluation and treatment of tenderness and some redness around nails feet.  Tenderness around toes with walking and wearing shoes.  Physical exam:  General appearance: Alert, pleasant, and in no acute distress.  Vascular: Pedal pulses: DP 1/4 B/L, PT 0/4 B/L.  Severe edema lower legs bilaterally.  Capillary refill time immediate bilaterally  Neurologic:  Dermatologic:  Nails thickened, disfigured, discolored 1-5 BL with subungual debris, except hallux right.  Redness and hypertrophic nail folds along nail folds bilaterally but no signs of drainage or infection.  Musculoskeletal:  Status post amputation hallux right   Diagnosis: 1. Painful onychomycotic nails 1 through 5 bilaterally. 2. Pain toes 1 through 5 bilaterally.  Plan: -Debrided onychomycotic nails 1 through 5 bilaterally, except hallux right.  Sharply debrided nails with nail clipper and reduced with a power bur.  Return 3 months [facility]

## 2024-10-07 ENCOUNTER — Ambulatory Visit: Admitting: Podiatry

## 2024-10-07 DIAGNOSIS — I129 Hypertensive chronic kidney disease with stage 1 through stage 4 chronic kidney disease, or unspecified chronic kidney disease: Secondary | ICD-10-CM | POA: Diagnosis not present

## 2024-10-07 DIAGNOSIS — N1832 Chronic kidney disease, stage 3b: Secondary | ICD-10-CM | POA: Diagnosis not present

## 2024-10-07 DIAGNOSIS — E1122 Type 2 diabetes mellitus with diabetic chronic kidney disease: Secondary | ICD-10-CM | POA: Diagnosis not present

## 2024-10-07 DIAGNOSIS — E785 Hyperlipidemia, unspecified: Secondary | ICD-10-CM | POA: Diagnosis not present

## 2024-10-07 DIAGNOSIS — R609 Edema, unspecified: Secondary | ICD-10-CM | POA: Diagnosis not present

## 2024-10-08 ENCOUNTER — Ambulatory Visit: Admitting: Podiatry

## 2024-10-13 DIAGNOSIS — H43822 Vitreomacular adhesion, left eye: Secondary | ICD-10-CM | POA: Diagnosis not present

## 2024-10-13 DIAGNOSIS — E113512 Type 2 diabetes mellitus with proliferative diabetic retinopathy with macular edema, left eye: Secondary | ICD-10-CM | POA: Diagnosis not present

## 2024-10-13 DIAGNOSIS — E113511 Type 2 diabetes mellitus with proliferative diabetic retinopathy with macular edema, right eye: Secondary | ICD-10-CM | POA: Diagnosis not present

## 2024-10-13 DIAGNOSIS — H26492 Other secondary cataract, left eye: Secondary | ICD-10-CM | POA: Diagnosis not present

## 2024-10-13 LAB — OPHTHALMOLOGY REPORT-SCANNED

## 2024-10-13 NOTE — Telephone Encounter (Addendum)
 Patient was identified as falling into the [place] Measure - Diabetes.   Patient was: Appointment already scheduled for:  12/27/24.

## 2024-10-21 ENCOUNTER — Telehealth: Payer: Self-pay

## 2024-10-21 NOTE — Telephone Encounter (Signed)
 Patient was identified as falling into the [place] Measure - Diabetes.   Patient was: Patient had A1 C drawn one month ago and 9 months ago. Can't check again for 2 months.

## 2024-10-29 ENCOUNTER — Other Ambulatory Visit: Payer: Self-pay | Admitting: Internal Medicine

## 2024-10-29 ENCOUNTER — Other Ambulatory Visit: Payer: Self-pay | Admitting: Family Medicine

## 2024-10-29 NOTE — Telephone Encounter (Signed)
 Copied from CRM #[id number]. Topic: Clinical - Medication Refill >> Oct 29, 2024 10:17 AM Jayma L wrote: Medication: ONETOUCH ULTRA test strip  Has the patient contacted their pharmacy? Yes (Agent: If no, request that the patient contact the pharmacy for the refill. If patient does not wish to contact the pharmacy document the reason why and proceed with request.) (Agent: If yes, when and what did the pharmacy advise?)  This is the patient's preferred pharmacy:  [id number] [address], [place] - [address] [place] [address] Phone: [phone] Fax: [phone]    Is this the correct pharmacy for this prescription? Yes If no, delete pharmacy and type the correct one.   Has the prescription been filled recently? No  Is the patient out of the medication? Yes  Has the patient been seen for an appointment in the last year OR does the patient have an upcoming appointment? Yes  Can we respond through MyChart? No  Agent: Please be advised that Rx refills may take up to 3 business days. We ask that you follow-up with your pharmacy.

## 2024-11-03 ENCOUNTER — Inpatient Hospital Stay: Admission: RE | Admit: 2024-11-03 | Discharge: 2024-11-03 | Attending: Family Medicine

## 2024-11-03 DIAGNOSIS — Z1231 Encounter for screening mammogram for malignant neoplasm of breast: Secondary | ICD-10-CM

## 2024-11-26 ENCOUNTER — Other Ambulatory Visit (HOSPITAL_COMMUNITY): Payer: Self-pay

## 2024-11-26 ENCOUNTER — Telehealth: Payer: Self-pay

## 2024-11-26 ENCOUNTER — Other Ambulatory Visit: Payer: Self-pay | Admitting: Internal Medicine

## 2024-11-26 NOTE — Telephone Encounter (Signed)
 Pharmacy Patient Advocate Encounter   Received notification from Pt Calls Messages that prior authorization for OneTouch Ultra test strips is required/requested.   Insurance verification completed.   The patient is insured through [facility] [employer].   Per test claim:  Accu-Chek or Contour is preferred by the insurance.  If suggested medication is appropriate, Please send in a new RX and discontinue this one. If not, please advise as to why it's not appropriate so that we may request a Prior Authorization. Please note, some preferred medications may still require a PA.  If the suggested medications have not been trialed and there are no contraindications to their use, the PA will not be submitted, as it will not be approved. Archived Key: [id number]  Test claim for Accu-chek shows a $0 copay for a 30 day supply.

## 2024-11-27 MED ORDER — ACCU-CHEK SOFTCLIX LANCETS MISC: 1.0000 | Freq: Four times a day (QID) | 3 refills | Status: AC | PRN

## 2024-11-27 MED ORDER — ACCU-CHEK GUIDE W/DEVICE KIT: 1.0000 | PACK | Freq: Four times a day (QID) | 0 refills | Status: AC

## 2024-11-27 NOTE — Telephone Encounter (Signed)
 Accu chek sent to pharmacy.

## 2024-11-27 NOTE — Addendum Note (Signed)
 Addended by: Exzavier Ruderman M on: 11/27/2024 10:49 AM   Modules accepted: Orders

## 2024-11-28 ENCOUNTER — Other Ambulatory Visit: Payer: Self-pay

## 2024-12-05 ENCOUNTER — Encounter: Payer: Self-pay | Admitting: Internal Medicine

## 2024-12-05 ENCOUNTER — Ambulatory Visit (INDEPENDENT_AMBULATORY_CARE_PROVIDER_SITE_OTHER): Admitting: Internal Medicine

## 2024-12-05 VITALS — BP 160/100 | Ht 67.0 in | Wt 225.0 lb

## 2024-12-05 DIAGNOSIS — E220 Acromegaly and pituitary gigantism: Secondary | ICD-10-CM | POA: Diagnosis not present

## 2024-12-05 DIAGNOSIS — Z794 Long term (current) use of insulin: Secondary | ICD-10-CM | POA: Diagnosis not present

## 2024-12-05 DIAGNOSIS — D352 Benign neoplasm of pituitary gland: Secondary | ICD-10-CM | POA: Diagnosis not present

## 2024-12-05 DIAGNOSIS — E1122 Type 2 diabetes mellitus with diabetic chronic kidney disease: Secondary | ICD-10-CM

## 2024-12-05 DIAGNOSIS — E1165 Type 2 diabetes mellitus with hyperglycemia: Secondary | ICD-10-CM

## 2024-12-05 DIAGNOSIS — N1832 Chronic kidney disease, stage 3b: Secondary | ICD-10-CM | POA: Diagnosis not present

## 2024-12-05 DIAGNOSIS — E038 Other specified hypothyroidism: Secondary | ICD-10-CM | POA: Diagnosis not present

## 2024-12-05 LAB — POCT GLYCOSYLATED HEMOGLOBIN (HGB A1C): Hemoglobin A1C: 10.2 % — AB (ref 4.0–5.6)

## 2024-12-05 MED ORDER — "SYRINGE/NEEDLE (DISP) 25G X 1"" 3 ML MISC": 1.0000 | Freq: Three times a day (TID) | 3 refills | Status: AC

## 2024-12-05 MED ORDER — OCTREOTIDE ACETATE 100 MCG/ML IJ SOLN: 100.0000 ug | Freq: Three times a day (TID) | INTRAMUSCULAR | 11 refills | Status: AC

## 2024-12-05 MED ORDER — CABERGOLINE 0.5 MG PO TABS: 0.5000 mg | ORAL_TABLET | ORAL | 3 refills | Status: AC

## 2024-12-05 MED ORDER — NOVOLOG FLEXPEN 100 UNIT/ML ~~LOC~~ SOPN: 10.0000 [IU] | PEN_INJECTOR | Freq: Three times a day (TID) | SUBCUTANEOUS | 3 refills | Status: AC

## 2024-12-05 MED ORDER — TRESIBA FLEXTOUCH 100 UNIT/ML ~~LOC~~ SOPN: 60.0000 [IU] | PEN_INJECTOR | Freq: Every day | SUBCUTANEOUS | 3 refills | Status: AC

## 2024-12-05 MED ORDER — LEVOTHYROXINE SODIUM 88 MCG PO TABS: 88.0000 ug | ORAL_TABLET | Freq: Every day | ORAL | 3 refills | Status: AC

## 2024-12-05 NOTE — Progress Notes (Signed)
 "  Name: Danielle Schroeder  MRN/ DOB: [id number], [birth year]    Age/ Sex: 69 y.o., female     PCP: Sebastian Beverley NOVAK, MD   Reason for Endocrinology Evaluation: Pituitary adenoma      Initial Endocrinology Clinic Visit: 10/06/2019    PATIENT IDENTIFIER: Danielle Schroeder is a 69 y.o., female with a past medical history of T2DM, dyslipidemia, pituitary adenoma, acromegaly, central hypothyroidism. She has followed with [facility] since 10/06/2019 for consultative assistance with management of her pituitary adenoma.     HISTORICAL SUMMARY:  Per patient she believes she had pituitary adenoma for >40 yrs. she does endorse history of subcutaneous injections in the past which sound like octreotide .  She was seen by Dr. Rockney in 2020 for evaluation of pituitary adenoma. 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 She was deemed not a surgical candidate due to high risk for complication She is S/P radiation treatment in 2021 , not a candidate for surgical intervention   Brain MRI 08/2024 showed abnormal enhancing soft tissue which extends from the sella to the floor of the third ventricle, unchanged from prior  I switched her from octreotide  injections to my oral tablets in October, 2025 as the patient has not been taking those injections  DM HISTORY : She was diagnosed with DM since the age of 34 units. She has been on insulin  for years. Her A1c ranges from 8.2 % in 2023 to 8.8% in 2022   THYROID  HISTORY: She has been diagnosed with thyroid  disease in 2010 and has been on LT-for replacement She was also diagnosed with multinodular goiter in December 2020 based on thyroid  ultrasound, no history of FNA   No FH of thyroid  disease  Has Fh of DM  Denies FH of pituitary disease   SUBJECTIVE:   Today (12/05/2024):  Danielle Schroeder is  here for Acromegaly , hypothyroidism and T2DM.  She checks glucose once 2x daily, did not bring meter today   Patient follows with podiatry with the last visit in 09/2024 No headaches  No constipation or diarrhea  NO vision changes  No palpitations  No joint pain  No hyperhidrosis  No hand or feet swelling   She follows with Nephrology Dr. 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, CKD Denies:  Last Eye Exam: Completed 05/13/2024  Macrovascular complications:  PVD Denies: CAD, CVA    HISTORY:  Past Medical History:  Past Medical History:  Diagnosis Date   Cataract    bilateral lens implants   CHF (congestive heart failure) (HCC)    Diabetes mellitus without complication (HCC)    Hyperlipidemia    Hypertension    Osteomyelitis of right lower extremity (HCC) 05/12/2023   Pituitary adenoma with extrasellar extension (HCC)    Retinal hemorrhage of left eye 03/02/2020   Retinal hemorrhage of right eye 03/02/2020   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   Sepsis (HCC) 05/13/2023   Sleep apnea    no c-pap   Thyroid  disease    hypothyroidism   Tubular adenoma  of colon 07/20/2022   Past Surgical History:  Past Surgical History:  Procedure Laterality Date   ABDOMINAL AORTOGRAM W/LOWER EXTREMITY Left 04/11/2023   Procedure: ABDOMINAL AORTOGRAM W/LOWER EXTREMITY;  Surgeon: Lanis Fonda BRAVO, MD;  Location: [facility] INVASIVE CV LAB;  Service: Cardiovascular;  Laterality: Left;   ABDOMINAL AORTOGRAM W/LOWER EXTREMITY N/A 05/16/2023   Procedure: ABDOMINAL AORTOGRAM W/LOWER EXTREMITY;  Surgeon: Lanis Fonda BRAVO, MD;  Location: [facility] INVASIVE CV LAB;  Service: Cardiovascular;  Laterality: N/A;    AMPUTATION Right 05/18/2023   Procedure: RIGHT GREAT TOE AMPUTATION;  Surgeon: Sheree Penne Bruckner, MD;  Location: [facility] OR;  Service: Vascular;  Laterality: Right;   BREAST EXCISIONAL BIOPSY Left    benign   BREAST SURGERY     CHOLECYSTECTOMY     PERIPHERAL VASCULAR BALLOON ANGIOPLASTY Left 04/11/2023   Procedure: PERIPHERAL VASCULAR BALLOON ANGIOPLASTY;  Surgeon: Lanis Fonda BRAVO, MD;  Location: [facility] INVASIVE CV LAB;  Service: Cardiovascular;  Laterality: Left;  Lt AT   PERIPHERAL VASCULAR INTERVENTION Left 04/11/2023   Procedure: PERIPHERAL VASCULAR INTERVENTION;  Surgeon: Lanis Fonda BRAVO, MD;  Location: [facility] INVASIVE CV LAB;  Service: Cardiovascular;  Laterality: Left;  Lt SFA   PERIPHERAL VASCULAR INTERVENTION  05/16/2023   Procedure: PERIPHERAL VASCULAR INTERVENTION;  Surgeon: Lanis Fonda BRAVO, MD;  Location: [facility] INVASIVE CV LAB;  Service: Cardiovascular;;   TONSILLECTOMY     TUBAL LIGATION     Social History:  reports that she has never smoked. She has never used smokeless tobacco. She reports that she does not drink alcohol and does not use drugs. Family History:  Family History  Problem Relation Age of Onset   Thyroid  disease Neg Hx    Colon cancer Neg Hx    Esophageal cancer Neg Hx    Rectal cancer Neg Hx    Stomach cancer Neg Hx    BRCA 1/2 Neg Hx    Breast cancer Neg Hx      HOME MEDICATIONS: Allergies as of 12/05/2024   No Known Allergies      Medication List        Accurate as of December 05, 2024 11:39 AM. If you have any questions, ask your nurse or doctor.          Accu-Chek Guide w/Device Kit 1 Device by Does not apply route 4 (four) times daily.   Accu-Chek Softclix Lancets lancets 1 each by Other route 4 (four) times daily as needed for other.   aspirin  EC 81 MG tablet Take 81 mg by mouth in the morning.   cabergoline  0.5 MG tablet Commonly known as: DOSTINEX  TAKE 1/2 TABLET (0.25 MG TOTAL) BY MOUTH TWICE A WEEK   carvedilol  25 MG  tablet Commonly known as: COREG  TAKE 1 TABLET (25 MG TOTAL) BY MOUTH TWICE A DAY WITH MEALS   clopidogrel  75 MG tablet Commonly known as: PLAVIX  Take 75 mg by mouth daily.   clotrimazole  1 % cream Commonly known as: LOTRIMIN  APPLY TO AFFECTED AREA TWICE A DAY   DULoxetine  30 MG capsule Commonly known as: CYMBALTA  TAKE 1 CAPSULE BY MOUTH DAILY FOR 7 DAY, THEN TAKE 2 CAPSULES DAILY.   fluconazole  150 MG tablet Commonly known as: DIFLUCAN  Take 1 tablet (150 mg total) by mouth every three (3) days as needed.   furosemide  40 MG tablet Commonly known as: LASIX  Take 1 tablet (40 mg total) by mouth 2 (two) times daily.   hydrocortisone  2.5 % ointment APPLY TOPICALLY 2 (TWO) TIMES DAILY. AS NEEDED FOR MILD  ECZEMA. DO NOT USE FOR MORE THAN 1-2 WEEKS AT A TIME.   levothyroxine  88 MCG tablet Commonly known as: SYNTHROID  Take 1 tablet (88 mcg total) by mouth daily.   linaclotide  72 MCG capsule Commonly known as: LINZESS  Take 1 capsule (72 mcg total) by mouth daily before breakfast.   losartan  100 MG tablet Commonly known as: COZAAR  Take 1 tablet (100 mg total) by mouth daily.   Mycapssa  20 MG Cpdr Generic drug: Octreotide  Acetate Take 40 mg by mouth in the morning and at bedtime.   NovoLOG  FlexPen 100 UNIT/ML FlexPen Generic drug: insulin  aspart INJECT 0-6 UNITS SUBCUTANEOUSLY THREE TIMES DAILY BEFORE MEALS MAX DAILY 45 UNITS   OneTouch Ultra test strip Generic drug: glucose blood USE 1 STRIP BY OTHER ROUTE 3 (THREE) TIMES DAILY. USE AS INSTRUCTED   Pen Needles 31G X 5 MM Misc 1 each by Does not apply route in the morning, at noon, in the evening, and at bedtime.   potassium chloride  10 MEQ tablet Commonly known as: KLOR-CON  TAKE 1 TABLET BY MOUTH EVERY DAY   simvastatin  20 MG tablet Commonly known as: ZOCOR  TAKE ONE TABLET BY MOUTH DAILY AT 5PM   SYRINGE-NEEDLE (DISP) 3 ML 25G X 1 3 ML Misc Inject 1 ml into the skin 3 times daily.   Tresiba  FlexTouch 100 UNIT/ML  FlexTouch Pen Generic drug: insulin  degludec Inject 54 Units into the skin daily.   Vitamin D  (Cholecalciferol ) 25 MCG (1000 UT) Caps Take 2 capsules by mouth daily.          OBJECTIVE:   PHYSICAL EXAM: VS: BP (!) 180/100 Comment: just took medicaiton  Ht 5' 7 (1.702 m)   Wt 225 lb (102.1 kg)   BMI 35.24 kg/m    EXAM: General: Pt appears well and is in NAD  Neck:  Thyroid : No goiter or nodules appreciated.   Lungs: Clear with good BS bilat   Heart: Auscultation: RRR.  Extremities:  BL LE: Stasis dermatitis with trace edema B/L   Mental Status: Judgment, insight: Intact Orientation: Oriented to time, place, and person Mood and affect: No depression, anxiety, or agitation   DM Foot exam 10/03/2024 per podiatry   DATA REVIEWED:   Latest Reference Range & Units 08/26/24 09:41  Sodium 135 - 145 mEq/L 138  Potassium 3.5 - 5.1 mEq/L 4.1  Chloride 96 - 112 mEq/L 101  CO2 19 - 32 mEq/L 29  Glucose 70 - 99 mg/dL 827 (H)  BUN 6 - 23 mg/dL 27 (H)  Creatinine 9.59 - 1.20 mg/dL 8.68 (H)  Calcium 8.4 - 10.5 mg/dL 9.3  Phosphorus 2.3 - 4.6 mg/dL 5.4 (H)  Alkaline Phosphatase 39 - 117 U/L 150 (H)  Albumin 3.5 - 5.2 g/dL 3.7  AST 0 - 37 U/L 15  ALT 0 - 35 U/L 10  Total Protein 6.0 - 8.3 g/dL 7.1  Total Bilirubin 0.2 - 1.2 mg/dL 0.4  GFR >39.99 mL/min 42.30 (L)    Latest Reference Range & Units 08/26/24 09:41 08/26/24 09:43  C206 ACTH  6 - 50 pg/mL 29   Cortisol, Serum LCMS ug/dL  10 (H)  LH mIU/mL  <9.7 (L)  FSH mIU/mL  <0.7 (L)  Prolactin ng/mL 1.4 (L)   Glucose 70 - 99 mg/dL 827 (H)   Estradiol  pg/mL <30     Latest Reference Range & Units 08/26/24 09:41  T4,Free(Direct) 0.60 - 1.60 ng/dL 8.80     Latest Reference Range & Units 08/26/24 09:41  C-Reactive Protein, Cardiac 0.000 -  5.000 mg/L 1.600  Pro B Natriuretic peptide (BNP) 0.0 - 100.0 pg/mL 91.0  Total CHOL/HDL Ratio  2  Cholesterol 0 - 200 mg/dL 857  HDL Cholesterol >60.99 mg/dL 35.89  LDL (calc) 0 -  99 mg/dL 60  MICROALB/CREAT RATIO 0.0 - 30.0 mg/g 21.1  NonHDL  77.99  Triglycerides 0.0 - 149.0 mg/dL 08.9  VLDL 0.0 - 59.9 mg/dL 81.7      Brain MRI 89/68/7974   FINDINGS: MRI brain:   There is abnormal enhancing tissue which extends from the sella to the floor of the third ventricle, unchanged from the prior study.   There is marked calvarial thickening.   There are linear areas of T2 hyperintensity in the periventricular white matter with low intensity on T1, without enhancement.   There are areas of mineralization in the dentate nuclei of the cerebellum and the basal ganglia.   No abnormal enhancement within the brain parenchyma.   There is no acute or chronic infarct.   No hydrocephalus.   There are normal flow signals in the carotid arteries and basilar artery.   No significant bone marrow signal abnormality.   No significant abnormality in the paranasal sinuses.   IMPRESSION: 1. There is abnormal enhancing soft tissue which extends from the sella to the floor of the third ventricle, unchanged. 2. Linear periventricular T2 hyperintensities without enhancement are unchanged 3. Basal ganglia and cerebellar mineralization unchanged       Thyroid  ultrasound 06/09/2024   FINDINGS: Parenchymal Echotexture: Moderately heterogeneous   Isthmus: 0.6 cm ,previously 0.5 cm   Right lobe: 4.5 x 2.0 x 2.1 cm ,previously 5.7 x 2.6 x 2.5 cm   Left lobe: 5.2 x 2.2 x 2.5 cm ,previously 4.3 x 2.7 x 2.0 cm   ________________________________________________________   Estimated total number of nodules >/= 1 cm: 3   Number of spongiform nodules >/=  2 cm not described below (TR1): 0   Number of mixed cystic and solid nodules >/= 1.5 cm not described below (TR2): 0   _________________________________________________________   Similar benign appearing solid cystic nodule in the right superior thyroid  (labeled 1, 1.0 cm, previously 24.0 cm).   Interval  development of benign appearing cystic nodule in the right mid thyroid  (labeled 2, 0.6 cm) which does not require additional follow-up (TI-RADS category 1).   Similar benign appearing solid nodule in the right inferior thyroid  (labeled 3, 0.9 cm, previously labeled 2, 0.8 cm).   Similar benign appearing solid nodule in the right inferior thyroid  (labeled 4, 1.6 cm, previously with 3, 1.9 cm).   Nodule # 5:   Prior biopsy: No   Location: Left; Mid   Maximum size: 1.6 cm; Other 2 dimensions: 1.4 x 0.9 cm, previously, 1.9 x 1.5 x 0.9 cm   Composition: solid/almost completely solid (2)   Echogenicity: isoechoic (1)   Shape: not taller-than-wide (0)   Margins: smooth (0)   Echogenic foci: none (0)   ACR TI-RADS total points: 3.   ACR TI-RADS risk category:  TR3 (3 points).   Significant change in size (>/= 20% in two dimensions and minimal increase of 2 mm): No   Change in features: No   Change in ACR TI-RADS risk category: No   ACR TI-RADS recommendations:   This nodule now demonstrates 5 year stability and is therefore declared benign.   _________________________________________________________   No cervical lymphadenopathy.   IMPRESSION: 1. Similar appearing multinodular thyroid . 2. Solid nodule in the left mid thyroid  (labeled 5, 1.6  cm, previously labeled 4, 1.9 cm) demonstrates 5 years of stability and is therefore declared benign. No additional follow-up is warranted.   Old records , labs and images have been reviewed.      ASSESSMENT / PLAN / RECOMMENDATIONS:   Acromegaly  -Patient with clinical features of acromegaly -She has been evaluated by neurosurgery in the past, patient declined transcranial surgical intervention and they have opted with radiation treatment -She is S/P radiation treatment in 2021 -She was on octreotide , 3 times daily subcutaneous injections, she was unable to switch to long-acting due to cost.  Due to nonadherence with 3  daily injections due to pain, I switched her to oral octreotide  (Mycapssa ), but the patient did NOT take the pills due to skepticism regarding side effects?  I did explain to the patient that this is the same injectable material that she has used for years.  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  Central hypothyroidism  -Patient is clinically euthyroid -Her most recent free T4 through PCPs office is normal --No change    Medication Continue levothyroxine  88 mcg daily  3. 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  She did not bring her glucose meter today - Metformin discontinued due to low GFR in June 2023 -Tradjenta  was discontinued due to ineffectiveness and persistent hyperglycemia -I will increase her insulin  as below   Medication  Increase Tresiba  60 units ONCE  daily Increase NovoLog  10 units 3 times daily before every meal     3.CKDIII/Microalbuminuria :   - Patient on losartan    4. HTN:   - Patient asymptomatic - She tells me she took her BP  meds later than usual today.  Discussed the importance of compliance and taking medication same time every day   follow-up in 3 months   Signed electronically by: Stefano Redgie Butts, MD  [facility] Endocrinology  [facility] [address] [place], [place] [address] Phone: [phone] FAX: [phone]      CC: Sebastian Beverley NOVAK, MD [address] [place] [address] Phone: [phone]  Fax: [phone]   Return to Endocrinology clinic as below: Future Appointments  Date Time Provider Department Center  12/26/2024 10:20 AM Sebastian Beverley NOVAK, MD [facility]  01/02/2025  9:15 AM Christine Rush, DPM [facility]  04/27/2025 11:30 AM [facility]NUAL WELLNESS VISIT [facility]    "

## 2024-12-05 NOTE — Patient Instructions (Addendum)
 Please look up the side effects of  Acromegaly  which is extra growth hormone    Restart Octreotide  injection 1 mL three times a day  Continue cabergoline  0.5 mg, 1 tablet TWICE WEEKLY ( Wednesday and Sunday) Take Tresiba  60 units ONCE DAILY  Take Novolog  10 units with each meal       HOW TO TREAT LOW BLOOD SUGARS (Blood sugar LESS THAN 70 MG/DL) Please follow the RULE OF 15 for the treatment of hypoglycemia treatment (when your (blood sugars are less than 70 mg/dL)   STEP 1: Take 15 grams of carbohydrates when your blood sugar is low, which includes:  3-4 GLUCOSE TABS  OR 3-4 OZ OF JUICE OR REGULAR SODA OR ONE TUBE OF GLUCOSE GEL    STEP 2: RECHECK blood sugar in 15 MINUTES STEP 3: If your blood sugar is still low at the 15 minute recheck --> then, go back to STEP 1 and treat AGAIN with another 15 grams of carbohydrates.

## 2024-12-16 NOTE — Telephone Encounter (Signed)
 Patient was identified as falling into the [place] Measure - Diabetes.   Patient was: Appointment already scheduled for:  12/26/24.

## 2024-12-26 ENCOUNTER — Ambulatory Visit: Admitting: Family Medicine

## 2025-01-02 ENCOUNTER — Ambulatory Visit: Admitting: Podiatry

## 2025-03-02 ENCOUNTER — Ambulatory Visit: Admitting: Internal Medicine

## 2025-04-27 ENCOUNTER — Ambulatory Visit
